# Patient Record
Sex: Female | Born: 1935 | Race: White | Hispanic: No | Smoking: Never smoker
Health system: Southern US, Community
[De-identification: ages and names within clinical notes are randomized; demographics above are authoritative.]

## PROBLEM LIST (undated history)

## (undated) DIAGNOSIS — I10 Essential (primary) hypertension: Secondary | ICD-10-CM

## (undated) DIAGNOSIS — M810 Age-related osteoporosis without current pathological fracture: Secondary | ICD-10-CM

## (undated) DIAGNOSIS — R251 Tremor, unspecified: Secondary | ICD-10-CM

## (undated) DIAGNOSIS — I4891 Unspecified atrial fibrillation: Secondary | ICD-10-CM

## (undated) DIAGNOSIS — I639 Cerebral infarction, unspecified: Secondary | ICD-10-CM

## (undated) HISTORY — DX: Unspecified atrial fibrillation: I48.91

## (undated) HISTORY — DX: Age-related osteoporosis without current pathological fracture: M81.0

## (undated) HISTORY — DX: Tremor, unspecified: R25.1

## (undated) HISTORY — DX: Essential (primary) hypertension: I10

## (undated) HISTORY — DX: Cerebral infarction, unspecified: I63.9

## (undated) HISTORY — PX: OTHER SURGICAL HISTORY: SHX169

## (undated) HISTORY — PX: CHOLECYSTECTOMY: SHX55

---

## 2004-05-16 DIAGNOSIS — C4492 Squamous cell carcinoma of skin, unspecified: Secondary | ICD-10-CM

## 2004-05-16 HISTORY — DX: Squamous cell carcinoma of skin, unspecified: C44.92

## 2004-11-14 DIAGNOSIS — C4491 Basal cell carcinoma of skin, unspecified: Secondary | ICD-10-CM

## 2004-11-14 DIAGNOSIS — D099 Carcinoma in situ, unspecified: Secondary | ICD-10-CM

## 2004-11-14 HISTORY — DX: Carcinoma in situ, unspecified: D09.9

## 2004-11-14 HISTORY — DX: Basal cell carcinoma of skin, unspecified: C44.91

## 2011-06-09 ENCOUNTER — Encounter (INDEPENDENT_AMBULATORY_CARE_PROVIDER_SITE_OTHER): Payer: Medicare Other | Admitting: Ophthalmology

## 2011-06-09 DIAGNOSIS — H26499 Other secondary cataract, unspecified eye: Secondary | ICD-10-CM

## 2011-06-09 DIAGNOSIS — H35039 Hypertensive retinopathy, unspecified eye: Secondary | ICD-10-CM

## 2011-06-09 DIAGNOSIS — H43819 Vitreous degeneration, unspecified eye: Secondary | ICD-10-CM

## 2011-06-09 DIAGNOSIS — H2510 Age-related nuclear cataract, unspecified eye: Secondary | ICD-10-CM

## 2011-06-09 DIAGNOSIS — I10 Essential (primary) hypertension: Secondary | ICD-10-CM

## 2011-06-10 ENCOUNTER — Encounter (INDEPENDENT_AMBULATORY_CARE_PROVIDER_SITE_OTHER): Payer: Self-pay | Admitting: Ophthalmology

## 2011-06-23 ENCOUNTER — Ambulatory Visit (INDEPENDENT_AMBULATORY_CARE_PROVIDER_SITE_OTHER): Payer: Medicare Other | Admitting: Ophthalmology

## 2011-06-23 DIAGNOSIS — H2700 Aphakia, unspecified eye: Secondary | ICD-10-CM

## 2017-11-11 ENCOUNTER — Ambulatory Visit (INDEPENDENT_AMBULATORY_CARE_PROVIDER_SITE_OTHER): Payer: Medicare Other | Admitting: Otolaryngology

## 2017-11-11 DIAGNOSIS — H903 Sensorineural hearing loss, bilateral: Secondary | ICD-10-CM

## 2017-11-11 DIAGNOSIS — H6123 Impacted cerumen, bilateral: Secondary | ICD-10-CM

## 2018-08-23 ENCOUNTER — Other Ambulatory Visit: Payer: Self-pay

## 2018-08-24 ENCOUNTER — Ambulatory Visit (INDEPENDENT_AMBULATORY_CARE_PROVIDER_SITE_OTHER): Payer: Medicare Other | Admitting: Physician Assistant

## 2018-08-24 ENCOUNTER — Encounter: Payer: Self-pay | Admitting: Physician Assistant

## 2018-08-24 VITALS — BP 139/76 | HR 68 | Temp 98.4°F | Ht 61.25 in | Wt 163.2 lb

## 2018-08-24 DIAGNOSIS — R251 Tremor, unspecified: Secondary | ICD-10-CM

## 2018-08-24 DIAGNOSIS — M810 Age-related osteoporosis without current pathological fracture: Secondary | ICD-10-CM | POA: Insufficient documentation

## 2018-08-24 DIAGNOSIS — I10 Essential (primary) hypertension: Secondary | ICD-10-CM | POA: Diagnosis not present

## 2018-08-28 NOTE — Progress Notes (Signed)
BP 139/76   Pulse 68   Temp 98.4 F (36.9 C) (Temporal)   Ht 5' 1.25" (1.556 m)   Wt 163 lb 3.2 oz (74 kg)   BMI 30.59 kg/m    Subjective:    Patient ID: Dominique Farley, female    DOB: 1926-11-25, 85 y.o.   MRN: [id number]  HPI: Dominique Farley is a 85 y.o. female presenting on 08/24/2018 for New Patient (Initial Visit) and Establish Care  This patient is accompanied by her daughter.  She has been a longtime patient of Dr. Melina Copa.  In November she will turn 89.  In reviewing her records it is noted that she does have mild hypertension, osteoporosis, and essential tremor.  She also reports that she has had several skin lesions and skin cancers.  She is seen by dermatology, Dr. Channing Mutters.  She has an upcoming appointment soon.  All of her medications are reviewed and we will update them at the pharmacy.  She reports that overall she does feel quite good and does not have any severe issues with pain, mobility, shortness of breath, chest pain.  Past Medical History:  Diagnosis Date  . Hypertension   . Osteoporosis   . Tremor    Relevant past medical, surgical, family and social history reviewed and updated as indicated. Interim medical history since our last visit reviewed. Allergies and medications reviewed and updated. DATA REVIEWED: CHART IN EPIC  Family History reviewed for pertinent findings.  Review of Systems  Constitutional: Negative.   HENT: Negative.   Eyes: Negative.   Respiratory: Negative.   Gastrointestinal: Negative.   Genitourinary: Negative.     Allergies as of 08/24/2018      Reactions   Influenza Vaccines    Sulfa Antibiotics Hives      Medication List       Accurate as of August 24, 2018 11:59 PM. If you have any questions, ask your nurse or doctor.        alendronate 70 MG tablet Commonly known as: FOSAMAX Take 70 mg by mouth once a week. Take with a full glass of water on an empty stomach.   hydrochlorothiazide 25 MG tablet Commonly known as:  HYDRODIURIL Take 25 mg by mouth 2 (two) times daily.   primidone 50 MG tablet Commonly known as: MYSOLINE TAKE 1 TABLET BY MOUTH THREE TIMES DAILY AS DIRECTED   propranolol 60 MG tablet Commonly known as: INDERAL Take 60 mg by mouth 2 (two) times daily.          Objective:    BP 139/76   Pulse 68   Temp 98.4 F (36.9 C) (Temporal)   Ht 5' 1.25" (1.556 m)   Wt 163 lb 3.2 oz (74 kg)   BMI 30.59 kg/m   Allergies  Allergen Reactions  . Influenza Vaccines   . Sulfa Antibiotics Hives    Wt Readings from Last 3 Encounters:  08/24/18 163 lb 3.2 oz (74 kg)    Physical Exam Constitutional:      General: She is not in acute distress.    Appearance: Normal appearance. She is well-developed.  HENT:     Head: Normocephalic and atraumatic.  Cardiovascular:     Rate and Rhythm: Normal rate.  Pulmonary:     Effort: Pulmonary effort is normal.  Skin:    General: Skin is warm and dry.     Findings: No rash.  Neurological:     Mental Status: She is alert and oriented to person, place,  and time.     Deep Tendon Reflexes: Reflexes are normal and symmetric.     No results found for this or any previous visit.    Assessment & Plan:   1. Tremor - primidone (MYSOLINE) 50 MG tablet; TAKE 1 TABLET BY MOUTH THREE TIMES DAILY AS DIRECTED - propranolol (INDERAL) 60 MG tablet; Take 60 mg by mouth 2 (two) times daily.  2. Essential hypertension - hydrochlorothiazide (HYDRODIURIL) 25 MG tablet; Take 25 mg by mouth 2 (two) times daily.  3. Age related osteoporosis, unspecified pathological fracture presence - alendronate (FOSAMAX) 70 MG tablet; Take 70 mg by mouth once a week. Take with a full glass of water on an empty stomach.   Continue all other maintenance medications as listed above.  Follow up plan: Return in about 3 months (around 11/24/2018).  Educational handout given for [place] PA-C [facility] [address]  [place],  [place] [address] [phone]   08/28/2018, 9:41 PM

## 2018-10-05 ENCOUNTER — Ambulatory Visit (INDEPENDENT_AMBULATORY_CARE_PROVIDER_SITE_OTHER): Payer: Medicare Other | Admitting: Orthopaedic Surgery

## 2018-10-05 ENCOUNTER — Ambulatory Visit: Payer: Self-pay

## 2018-10-05 ENCOUNTER — Other Ambulatory Visit: Payer: Self-pay

## 2018-10-05 ENCOUNTER — Encounter: Payer: Self-pay | Admitting: Orthopaedic Surgery

## 2018-10-05 VITALS — BP 200/100 | HR 68 | Ht 61.0 in | Wt 153.0 lb

## 2018-10-05 DIAGNOSIS — M25562 Pain in left knee: Secondary | ICD-10-CM

## 2018-10-05 DIAGNOSIS — M25561 Pain in right knee: Secondary | ICD-10-CM

## 2018-10-05 DIAGNOSIS — G8929 Other chronic pain: Secondary | ICD-10-CM | POA: Diagnosis not present

## 2018-10-05 DIAGNOSIS — M1711 Unilateral primary osteoarthritis, right knee: Secondary | ICD-10-CM | POA: Insufficient documentation

## 2018-10-05 DIAGNOSIS — M170 Bilateral primary osteoarthritis of knee: Secondary | ICD-10-CM

## 2018-10-05 MED ORDER — LIDOCAINE HCL 1 % IJ SOLN
2.0000 mL | INTRAMUSCULAR | Status: AC | PRN
  Administered 2018-10-05: 10:00:00 2 mL

## 2018-10-05 MED ORDER — METHYLPREDNISOLONE ACETATE 40 MG/ML IJ SUSP
80.0000 mg | INTRAMUSCULAR | Status: AC | PRN
  Administered 2018-10-05: 80 mg via INTRA_ARTICULAR

## 2018-10-05 MED ORDER — BUPIVACAINE HCL 0.5 % IJ SOLN
2.0000 mL | INTRAMUSCULAR | Status: AC | PRN
  Administered 2018-10-05: 10:00:00 2 mL via INTRA_ARTICULAR

## 2018-10-05 NOTE — Progress Notes (Signed)
Office Visit Note   Patient: Dominique Farley           Date of Birth: 03/01/1926           MRN: [id number] Visit Date: 10/05/2018              Requested by: Terald Sleeper, PA-C [address] [place],  [place] [address] PCP: Terald Sleeper, PA-C   Assessment & Plan: Visit Diagnoses:  1. Chronic pain of both knees   2. Bilateral primary osteoarthritis of knee     Plan: Advanced primary osteoarthritis both knees.  Right is more symptomatic than left.  Long discussion over 45 minutes regarding her diagnosis and treatment options.  She has had cortisone through her primary care physician in the past but now has retired.  Will inject the right knee with cortisone.  Return in 2 weeks to inject the left knee.  Pre-CERT Visco supplementation  Follow-Up Instructions: Return in about 2 weeks (around 10/19/2018).   Orders:  Orders Placed This Encounter  Procedures  . Large Joint Inj: R knee  . XR KNEE 3 VIEW LEFT  . XR KNEE 3 VIEW RIGHT   No orders of the defined types were placed in this encounter.     Procedures: Large Joint Inj: R knee on 10/05/2018 10:18 AM Indications: pain and diagnostic evaluation Details: 25 G 1.5 in needle, anteromedial approach  Arthrogram: No  Medications: 2 mL lidocaine 1 %; 2 mL bupivacaine 0.5 %; 80 mg methylPREDNISolone acetate 40 MG/ML Procedure, treatment alternatives, risks and benefits explained, specific risks discussed. Consent was given by the patient. Immediately prior to procedure a time out was called to verify the correct patient, procedure, equipment, support staff and site/side marked as required. Patient was prepped and draped in the usual sterile fashion.       Clinical Data: No additional findings.   Subjective: Chief Complaint  Patient presents with  . Left Knee - Pain  . Right Knee - Pain  Patient presents today for bilateral knee pain. X1 year. No known injury. Pain is worse anteriorly. She states that they feel like they will  give way with walking. Her pain is worse at night and while weightbearing. She takes Advil as needed.  Has had prior diagnosis of osteoarthritis through her primary care physician's office.  Has also had prior cortisone injections.  Her primary care physician, Dr. Melina Copa, has now retired  HPI  Review of Systems   Objective: Vital Signs: BP (!) 200/100   Pulse 68   Ht 5\' 1"  (1.549 m)   Wt 153 lb (69.4 kg)   BMI 28.91 kg/m   Physical Exam Constitutional:      Appearance: She is well-developed.  Eyes:     Pupils: Pupils are equal, round, and reactive to light.  Pulmonary:     Effort: Pulmonary effort is normal.  Skin:    General: Skin is warm and dry.  Neurological:     Mental Status: She is alert and oriented to person, place, and time.  Psychiatric:        Behavior: Behavior normal.     Ortho Exam awake alert and oriented x3.  Does have a little bit of an awkward gait related to the pain in her knees.  Full extension bilaterally and flex about 105degrees.  Small effusion right knee and none on the left.  Slight increased valgus on the right compared to the left.  No instability.  No popliteal pain or  mass.  Positive patella crepitation but no pain with compression.  Straight leg raise negative.  Painless range of motion both hips Specialty Comments:  No specialty comments available.  Imaging: Xr Knee 3 View Left  Result Date: 10/05/2018 Films of the left knee were obtained in several projections standing.  Similar to the right knee there are considerable degenerative changes.  Alignment appears to be neutral.  More degenerative change laterally than medially with peripheral osteophytes and narrowing of the joint space.  No ectopic calcification or acute change.  There is lateral patella tilt and considerable loss of the lateral patella joint space.  Films are consistent with advanced osteoarthritis  Xr Knee 3 View Right  Result Date: 10/05/2018 Films of the right knee were  obtained in 3 projections standing.  There are considerable degenerative changes in all 3 compartments but predominantly the patellofemoral joint and the lateral compartment.  There is narrowing of the joint space and large peripheral osteophytes laterally as well as some subchondral sclerosis.  There is lateral patella tilt with near obliteration of the lateral patella joint space.  No acute changes or ectopic calcification    PMFS History: Patient Active Problem List   Diagnosis Date Noted  . Bilateral primary osteoarthritis of knee 10/05/2018  . Tremor 08/24/2018  . Essential hypertension 08/24/2018  . Age related osteoporosis 08/24/2018   Past Medical History:  Diagnosis Date  . Hypertension   . Osteoporosis   . Tremor     Family History  Problem Relation Age of Onset  . Dementia Mother   . Stroke Father   . Heart disease Father   . Diabetes Sister     Past Surgical History:  Procedure Laterality Date  . CHOLECYSTECTOMY     Social History   Occupational History  . Not on file  Tobacco Use  . Smoking status: Never Smoker  . Smokeless tobacco: Never Used  Substance and Sexual Activity  . Alcohol use: Never    Frequency: Never  . Drug use: Never  . Sexual activity: Not on file

## 2018-10-10 ENCOUNTER — Other Ambulatory Visit: Payer: Self-pay | Admitting: Physician Assistant

## 2018-10-10 DIAGNOSIS — R251 Tremor, unspecified: Secondary | ICD-10-CM

## 2018-10-13 ENCOUNTER — Telehealth: Payer: Self-pay | Admitting: Orthopaedic Surgery

## 2018-10-13 NOTE — Telephone Encounter (Signed)
Please see below.

## 2018-10-13 NOTE — Telephone Encounter (Signed)
Patient left a voicemail stating she had appointment with Dr. Durward Fortes at the [place] office last week.  Patient states "he gave me a shot in my knee and I received a call that my insurance company won't pay for it."  Patient states "they have been paying for it when Dr. Melina Copa gave them to me."  Patient is requesting a return call.

## 2018-10-18 DIAGNOSIS — I63512 Cerebral infarction due to unspecified occlusion or stenosis of left middle cerebral artery: Secondary | ICD-10-CM

## 2018-10-18 HISTORY — DX: Cerebral infarction due to unspecified occlusion or stenosis of left middle cerebral artery: I63.512

## 2018-10-19 ENCOUNTER — Ambulatory Visit: Payer: Medicare Other | Admitting: Orthopaedic Surgery

## 2018-10-21 DIAGNOSIS — I161 Hypertensive emergency: Secondary | ICD-10-CM | POA: Insufficient documentation

## 2018-10-25 ENCOUNTER — Telehealth: Payer: Self-pay

## 2018-10-25 NOTE — Telephone Encounter (Signed)
Received information from [place] at the [place] office stating that patient was approved for gel injection, right knee. Patient's appointment was canceled due to being in the hospital.  Approved for Synvisc series, right knee. No PA required, per Sharee Pimple with [employer] Medicare Reference# [id number] on 10/12/2018

## 2018-10-31 ENCOUNTER — Telehealth: Payer: Self-pay | Admitting: Physician Assistant

## 2018-10-31 NOTE — Telephone Encounter (Signed)
Appointment scheduled 10/05/2018 at 9:25 with Particia Nearing.

## 2018-11-03 ENCOUNTER — Other Ambulatory Visit: Payer: Self-pay

## 2018-11-04 ENCOUNTER — Encounter: Payer: Self-pay | Admitting: Physician Assistant

## 2018-11-04 ENCOUNTER — Ambulatory Visit (INDEPENDENT_AMBULATORY_CARE_PROVIDER_SITE_OTHER): Payer: Medicare Other | Admitting: Physician Assistant

## 2018-11-04 VITALS — BP 138/74 | HR 81 | Temp 96.8°F | Ht 61.0 in | Wt 153.0 lb

## 2018-11-04 DIAGNOSIS — I10 Essential (primary) hypertension: Secondary | ICD-10-CM

## 2018-11-04 DIAGNOSIS — I4811 Longstanding persistent atrial fibrillation: Secondary | ICD-10-CM | POA: Diagnosis not present

## 2018-11-04 DIAGNOSIS — I161 Hypertensive emergency: Secondary | ICD-10-CM | POA: Diagnosis not present

## 2018-11-04 DIAGNOSIS — L03113 Cellulitis of right upper limb: Secondary | ICD-10-CM

## 2018-11-04 DIAGNOSIS — Z5181 Encounter for therapeutic drug level monitoring: Secondary | ICD-10-CM | POA: Insufficient documentation

## 2018-11-04 DIAGNOSIS — I63512 Cerebral infarction due to unspecified occlusion or stenosis of left middle cerebral artery: Secondary | ICD-10-CM

## 2018-11-04 DIAGNOSIS — I6930 Unspecified sequelae of cerebral infarction: Secondary | ICD-10-CM

## 2018-11-04 DIAGNOSIS — I4891 Unspecified atrial fibrillation: Secondary | ICD-10-CM | POA: Insufficient documentation

## 2018-11-04 DIAGNOSIS — Z7901 Long term (current) use of anticoagulants: Secondary | ICD-10-CM | POA: Insufficient documentation

## 2018-11-04 LAB — COAGUCHEK XS/INR WAIVED
INR: 3.2 — ABNORMAL HIGH (ref 0.9–1.1)
Prothrombin Time: 38.6 s

## 2018-11-04 MED ORDER — CEPHALEXIN 250 MG PO CAPS: 250.0000 mg | ORAL_CAPSULE | Freq: Three times a day (TID) | ORAL | 0 refills | Status: DC

## 2018-11-04 NOTE — Progress Notes (Addendum)
BP 138/74   Pulse 81   Temp (!) 96.8 F (36 C) (Temporal)   Ht 5\' 1"  (1.549 m)   Wt 153 lb (69.4 kg)   SpO2 97%   BMI 28.91 kg/m    Subjective:    Patient ID: Dominique Farley, female    DOB: Feb 24, 1926, 85 y.o.   MRN: [id number]  HPI: Dominique Farley is a 85 y.o. female presenting on 11/04/2018 for Hospitalization Follow-up    This patient is here for face-to-face follow-up following her CVA.  She was admitted to [facility] in [place].  She was discharged on 10/28/2018.  There was not the best of care when they were back on the floor.  While they were in the ICU and postsurgical everything went very well.  The patient went to the hospital here in [place].  Because of her hypertensive crisis and a blood clot that was seen on scan she was airlifted to the hospital and surgery performed.  She did have hypertensive crisis, atrial fibrillation and CVA.  Her charts are reviewed.  Her medications are updated.  She can stop the Lovenox as of today.  We will continue with the Coumadin.  She is taking 5 mg 1 daily.  We will plan for recheck in 1 week on the INR.  We are going to reach out to her home health agency and see if they can perform that while she is still somewhat limited in mobility and wheelchair-bound.  She already has physical therapy and Occupational Therapy coming to the home.  There is plan for speech therapy.  Her stroke left her with right-sided weakness in the arm and leg.  Also with speech been affected and loss of finding words.  She also has a very large hematoma where they had an IV.  Pictures included.  They have been trying to keep it warm and dressed and ice to the area.  I do think there is some infection there but also hematoma being aggravated because of her blood thinners.  For now she is to continue her aspirin every other day and Coumadin as discussed All of her other medications are updated.   Past Medical History:  Diagnosis Date  .  Hypertension   . Osteoporosis   . Tremor    Relevant past medical, surgical, family and social history reviewed and updated as indicated. Interim medical history since our last visit reviewed. Allergies and medications reviewed and updated. DATA REVIEWED: CHART IN EPIC  Family History reviewed for pertinent findings.  Review of Systems  Constitutional: Positive for fatigue. Negative for activity change and fever.  HENT: Negative.   Eyes: Negative.   Respiratory: Negative.  Negative for cough, shortness of breath and wheezing.   Cardiovascular: Negative.  Negative for chest pain, palpitations and leg swelling.  Gastrointestinal: Negative.  Negative for abdominal pain.  Endocrine: Negative.   Genitourinary: Negative.  Negative for dysuria.  Musculoskeletal: Negative.   Skin: Positive for color change and wound.  Neurological: Positive for speech difficulty and weakness. Negative for dizziness, light-headedness and numbness.    Allergies as of 11/04/2018      Reactions   Codeine Other (See Comments)   Influenza Vaccines    Sulfa Antibiotics Hives      Medication List       Accurate as of November 04, 2018 10:16 AM. If you have any questions, ask your nurse or doctor.        STOP taking these medications  alendronate 70 MG tablet Commonly known as: FOSAMAX Stopped by: Terald Sleeper, PA-C   hydrochlorothiazide 25 MG tablet Commonly known as: HYDRODIURIL Stopped by: Terald Sleeper, PA-C     TAKE these medications   amLODipine 5 MG tablet Commonly known as: NORVASC Take 5 mg by mouth daily.   aspirin EC 81 MG tablet Take 81 mg by mouth daily.   atorvastatin 40 MG tablet Commonly known as: LIPITOR Take 40 mg by mouth at bedtime.   cephALEXin 250 MG capsule Commonly known as: KEFLEX Take 1 capsule (250 mg total) by mouth 3 (three) times daily. Started by: Terald Sleeper, PA-C   enoxaparin 80 MG/0.8ML injection Commonly known as: LOVENOX Inject 70 mg into the  skin 2 (two) times daily.   ketoconazole 2 % cream Commonly known as: NIZORAL Apply 1 application topically daily.   lisinopril 40 MG tablet Commonly known as: ZESTRIL Take 40 mg by mouth daily.   primidone 50 MG tablet Commonly known as: MYSOLINE TAKE 1 TABLET BY MOUTH THREE TIMES DAILY AS DIRECTED   propranolol 60 MG tablet Commonly known as: INDERAL Take 30 mg by mouth 2 (two) times daily. What changed: Another medication with the same name was removed. Continue taking this medication, and follow the directions you see here. Changed by: Terald Sleeper, PA-C   sennosides-docusate sodium 8.6-50 MG tablet Commonly known as: SENOKOT-S Take 2 tablets by mouth daily.   triamcinolone cream 0.1 % Commonly known as: KENALOG Apply 1 application topically 2 (two) times daily.   warfarin 5 MG tablet Commonly known as: COUMADIN Take 5 mg by mouth daily.          Objective:    BP 138/74   Pulse 81   Temp (!) 96.8 F (36 C) (Temporal)   Ht 5\' 1"  (1.549 m)   Wt 153 lb (69.4 kg)   SpO2 97%   BMI 28.91 kg/m   Allergies  Allergen Reactions  . Codeine Other (See Comments)  . Influenza Vaccines   . Sulfa Antibiotics Hives    Wt Readings from Last 3 Encounters:  11/04/18 153 lb (69.4 kg)  10/05/18 153 lb (69.4 kg)  08/24/18 163 lb 3.2 oz (74 kg)    Physical Exam Constitutional:      General: She is not in acute distress.    Appearance: Normal appearance. She is well-developed.  HENT:     Head: Normocephalic and atraumatic.  Cardiovascular:     Rate and Rhythm: Normal rate.  Pulmonary:     Effort: Pulmonary effort is normal.  Skin:    General: Skin is warm and dry.     Findings: Bruising present. No rash.          Comments: 1 large hematoma/abscess on the right forearm just above the wrist.  Mildly tender to the touch, skin is dry and intact. Photo is included  Neurological:     Mental Status: She is alert and oriented to person, place, and time.     Cranial  Nerves: Cranial nerves are intact.     Motor: Weakness present.     Deep Tendon Reflexes: Reflexes are normal and symmetric.     Comments: Decreased strength in upper and lower extremities Patient is having some difficulty with her speech and and word finding     No results found for this or any previous visit.    Assessment & Plan:   1. Encounter for monitoring Coumadin therapy - CoaguChek XS/INR [employer] [facility]  2. Longstanding persistent atrial fibrillation (HCC) - warfarin (COUMADIN) 5 MG tablet; Take 5 mg by mouth daily.  3. Cerebrovascular accident (CVA) due to occlusion of left middle cerebral artery (HCC) - aspirin EC 81 MG tablet; Take 81 mg by mouth daily. - atorvastatin (LIPITOR) 40 MG tablet; Take 40 mg by mouth at bedtime. - enoxaparin (LOVENOX) 80 MG/0.8ML injection; Inject 70 mg into the skin 2 (two) times daily. - warfarin (COUMADIN) 5 MG tablet; Take 5 mg by mouth daily.  4. Hypertensive emergency  5. Cellulitis of right upper extremity - cephALEXin (KEFLEX) 250 MG capsule; Take 1 capsule (250 mg total) by mouth 3 (three) times daily.  Dispense: 30 capsule; Refill: 0  6. Essential hypertension - amLODipine (NORVASC) 5 MG tablet; Take 5 mg by mouth daily. - lisinopril (ZESTRIL) 40 MG tablet; Take 40 mg by mouth daily. - propranolol (INDERAL) 60 MG tablet; Take 30 mg by mouth 2 (two) times daily.    Continue all other maintenance medications as listed above.  Follow up plan: Return in about 4 weeks (around 12/02/2018).  Educational handout given for [place] PA-C [facility] [address]  [place], [place] [address] [phone]   11/04/2018, 10:16 AM

## 2018-11-07 ENCOUNTER — Telehealth: Payer: Self-pay | Admitting: Physician Assistant

## 2018-11-07 ENCOUNTER — Telehealth: Payer: Self-pay | Admitting: *Deleted

## 2018-11-07 ENCOUNTER — Other Ambulatory Visit: Payer: Self-pay | Admitting: Physician Assistant

## 2018-11-07 DIAGNOSIS — I4811 Longstanding persistent atrial fibrillation: Secondary | ICD-10-CM

## 2018-11-07 DIAGNOSIS — I63512 Cerebral infarction due to unspecified occlusion or stenosis of left middle cerebral artery: Secondary | ICD-10-CM

## 2018-11-07 MED ORDER — AMOXICILLIN 250 MG PO CAPS: 250.0000 mg | ORAL_CAPSULE | Freq: Three times a day (TID) | ORAL | 0 refills | Status: DC

## 2018-11-07 NOTE — Telephone Encounter (Signed)
VM from [place] w/ [facility] Today's INR 2.5 Pt takes 5 mg daily & ASA QOD

## 2018-11-07 NOTE — Telephone Encounter (Signed)
This patient is homebound, her home health agency performed at the bar conference today. 11/07/2018 Result 2.5 This is well controlled Goal 2.0-3.0 Current dose 5 mg daily Continue with this dose and plan to recheck in 1 week, patient is just moving over to Coumadin therapy.  Consider MD INR  Terald Sleeper PA-C [facility] [address]  [place], [place] [address] [phone]

## 2018-11-07 NOTE — Telephone Encounter (Signed)
Stop keflex, start amoxicillin, sent to pharm

## 2018-11-07 NOTE — Telephone Encounter (Signed)
Since beginning Keflex patient has been experiencing extreme weakness and fatigue.  Her daughters stopped the antibiotic yesterday and are wanting to you send in something else to [facility].  Please advise.

## 2018-11-07 NOTE — Telephone Encounter (Signed)
Please continue coumadin at 5 mg one tab daily, please have home health check in 1 week.  Can outreach to [place] be made about her qualifying for [place]? 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

## 2018-11-07 NOTE — Telephone Encounter (Signed)
Patient aware.

## 2018-11-07 NOTE — Telephone Encounter (Signed)
Left detailed message on [place] with specific coumadin instructions.

## 2018-11-15 ENCOUNTER — Other Ambulatory Visit: Payer: Self-pay | Admitting: Physician Assistant

## 2018-11-15 ENCOUNTER — Telehealth: Payer: Self-pay | Admitting: *Deleted

## 2018-11-15 DIAGNOSIS — I63512 Cerebral infarction due to unspecified occlusion or stenosis of left middle cerebral artery: Secondary | ICD-10-CM

## 2018-11-15 DIAGNOSIS — I4811 Longstanding persistent atrial fibrillation: Secondary | ICD-10-CM

## 2018-11-15 NOTE — Telephone Encounter (Signed)
VM from [place] w/ [facility] Pt is impacted, would like order for disimpaction Please advise

## 2018-11-15 NOTE — Telephone Encounter (Signed)
VM from [place] w/ [facility] INR 2.3

## 2018-11-15 NOTE — Telephone Encounter (Signed)
Pt's daughter was able to disimpact patient

## 2018-11-15 NOTE — Telephone Encounter (Signed)
This patient is homebound from CVA, her home health agency performed at the bar conference today.  11/15/2018 Result 2.3, well controlled Goal 2.0-3.0 Current dose 5 mg daily Continue with this dose and plan to recheck in 2 week, patient is just moving over to Coumadin therapy.  Consider MD INR  Terald Sleeper PA-C [facility] [address]  [place], [place] [address] [phone]

## 2018-11-15 NOTE — Telephone Encounter (Signed)
[facility] placed order for disimpaction

## 2018-11-15 NOTE — Telephone Encounter (Signed)
Left detailed message with [facility].

## 2018-11-21 ENCOUNTER — Ambulatory Visit (INDEPENDENT_AMBULATORY_CARE_PROVIDER_SITE_OTHER): Payer: Medicare Other | Admitting: Nurse Practitioner

## 2018-11-21 ENCOUNTER — Encounter: Payer: Self-pay | Admitting: Nurse Practitioner

## 2018-11-21 DIAGNOSIS — S60221A Contusion of right hand, initial encounter: Secondary | ICD-10-CM

## 2018-11-21 NOTE — Progress Notes (Signed)
   Virtual Visit via telephone Note Due to COVID-19 pandemic this visit was conducted virtually. This visit type was conducted due to national recommendations for restrictions regarding the COVID-19 Pandemic (e.g. social distancing, sheltering in place) in an effort to limit this patient's exposure and mitigate transmission in our community. All issues noted in this document were discussed and addressed.  A physical exam was not performed with this format.  I connected with Dominique Farley on 11/21/18 at 10:20 by telephone and verified that I am speaking with the correct person using two identifiers. Dominique Farley is currently located at home and her daughter is currently with her during visit. The provider, Mary-Margaret Hassell Done, FNP is located in their office at time of visit.  I discussed the limitations, risks, security and privacy concerns of performing an evaluation and management service by telephone and the availability of in person appointments. I also discussed with the patient that there may be a patient responsible charge related to this service. The patient expressed understanding and agreed to proceed.   History and Present Illness: * spoke with daughter because she cannot hear on the phone.   Chief Complaint: Hand Pain   HPI Daughter says that mom woke up Saturday with c/o pain right hand. They could not see anything on it. yesterday she was still c/o soe pain but they noticed that her hand was brusied and swollen between her right index and middle finger. She denies any pain today. No erythema and not hot to touch. Patient is on blood thinner.  Review of Systems  Constitutional: Negative.   HENT: Negative.   Respiratory: Negative.   Cardiovascular: Negative.   Genitourinary: Negative.   Musculoskeletal: Negative.   Skin: Negative.   Neurological: Negative.   Psychiatric/Behavioral: Negative.   All other systems reviewed and are negative.    Observations/Objective: Alert and  oriented- answers all questions appropriately No distress    Assessment and Plan: Dominique Farley in today with chief complaint of Hand Pain   1. Contusion of right hand, initial encounter Ice Elevate when sitting   Follow Up Instructions: prn    I discussed the assessment and treatment plan with the patient. The patient was provided an opportunity to ask questions and all were answered. The patient agreed with the plan and demonstrated an understanding of the instructions.   The patient was advised to call back or seek an in-person evaluation if the symptoms worsen or if the condition fails to improve as anticipated.  The above assessment and management plan was discussed with the patient. The patient verbalized understanding of and has agreed to the management plan. Patient is aware to call the clinic if symptoms persist or worsen. Patient is aware when to return to the clinic for a follow-up visit. Patient educated on when it is appropriate to go to the emergency department.   Time call ended:  10:33 I provided 13 minutes of non-face-to-face time during this encounter.    Mary-Margaret Hassell Done, FNP

## 2018-11-24 ENCOUNTER — Other Ambulatory Visit: Payer: Self-pay

## 2018-11-25 ENCOUNTER — Encounter: Payer: Self-pay | Admitting: Physician Assistant

## 2018-11-25 ENCOUNTER — Ambulatory Visit (INDEPENDENT_AMBULATORY_CARE_PROVIDER_SITE_OTHER): Payer: Medicare Other | Admitting: Physician Assistant

## 2018-11-25 VITALS — BP 133/67 | HR 73 | Temp 96.4°F | Ht 61.0 in

## 2018-11-25 DIAGNOSIS — I6930 Unspecified sequelae of cerebral infarction: Secondary | ICD-10-CM

## 2018-11-25 DIAGNOSIS — I4811 Longstanding persistent atrial fibrillation: Secondary | ICD-10-CM | POA: Diagnosis not present

## 2018-11-25 DIAGNOSIS — I10 Essential (primary) hypertension: Secondary | ICD-10-CM

## 2018-11-25 DIAGNOSIS — I63512 Cerebral infarction due to unspecified occlusion or stenosis of left middle cerebral artery: Secondary | ICD-10-CM

## 2018-11-25 LAB — COAGUCHEK XS/INR WAIVED
INR: 2.4 — ABNORMAL HIGH (ref 0.9–1.1)
Prothrombin Time: 29 s

## 2018-11-25 MED ORDER — ATORVASTATIN CALCIUM 40 MG PO TABS: 40.0000 mg | ORAL_TABLET | Freq: Every day | ORAL | 1 refills | Status: DC

## 2018-11-25 MED ORDER — LISINOPRIL 20 MG PO TABS: 20.0000 mg | ORAL_TABLET | Freq: Every day | ORAL | 1 refills | Status: DC

## 2018-11-25 MED ORDER — AMLODIPINE BESYLATE 5 MG PO TABS: 5.0000 mg | ORAL_TABLET | Freq: Every day | ORAL | 1 refills | Status: DC

## 2018-11-25 MED ORDER — WARFARIN SODIUM 5 MG PO TABS: 5.0000 mg | ORAL_TABLET | Freq: Every day | ORAL | 1 refills | Status: DC

## 2018-12-02 NOTE — Progress Notes (Signed)
BP 133/67   Pulse 73   Temp (!) 96.4 F (35.8 C) (Temporal)   Ht 5\' 1"  (1.549 m)   SpO2 98%   BMI 28.91 kg/m    Subjective:    Patient ID: Dominique Farley, female    DOB: [birth year], 85 y.o.   MRN: [id number]  HPI: Dominique Farley is a 85 y.o. female presenting on 11/25/2018 for Hypertension, Anticoagulation, Medical Management of Chronic Issues (3 month ), and Hand Pain (swelling )  This patient comes in for recheck from her recent stroke.  She is accompanied by her daughter.  She states that they had gotten her up and walking very well with a walker.  She is able to transverse through her house very well.  She is able to get up out of the bed now and get to the bathroom using the walker.  She still does need follow-ups with cardiology and neurology.  She denies any other new symptoms, no other neurologic changes.  No chest pain no shortness of breath.  Home health is still in the home.  Home health is also drawing the PT/INR for her Coumadin therapy.  This patient comes in for an anticoagulation visit.     Indication: CVA Bleeding signs/symptoms: None Thromboembolic signs/symptoms: None  Missed Coumadin doses: None Medication changes: no Dietary changes: no Bacterial/viral infection: no Other concerns: no   11/04/2018: This patient is here for face-to-face follow-up following her CVA.  She was admitted to [facility] in [place].  She was discharged on 10/28/2018.  There was not the best of care when they were back on the floor.  While they were in the ICU and postsurgical everything went very well.  The patient went to the hospital here in [facility].  Because of her hypertensive crisis and a blood clot that was seen on scan she was airlifted to the hospital and surgery performed.  She did have hypertensive crisis, atrial fibrillation and CVA.  Her charts are reviewed.  Her medications are updated.  She can stop the Lovenox as of today.  We will continue with  the Coumadin.  She is taking 5 mg 1 daily.  We will plan for recheck in 1 week on the INR.  We are going to reach out to her home health agency and see if they can perform that while she is still somewhat limited in mobility and wheelchair-bound.  She already has physical therapy and Occupational Therapy coming to the home.  There is plan for speech therapy.  Her stroke left her with right-sided weakness in the arm and leg.  Also with speech been affected and loss of finding words.  Past Medical History:  Diagnosis Date  . Hypertension   . Osteoporosis   . Tremor    Relevant past medical, surgical, family and social history reviewed and updated as indicated. Interim medical history since our last visit reviewed. Allergies and medications reviewed and updated. DATA REVIEWED: CHART IN EPIC  Family History reviewed for pertinent findings.  Review of Systems  Constitutional: Negative.  Negative for activity change, fatigue and fever.  HENT: Negative.   Eyes: Negative.   Respiratory: Negative.  Negative for cough.   Cardiovascular: Negative.  Negative for chest pain.  Gastrointestinal: Negative.  Negative for abdominal pain.  Endocrine: Negative.   Genitourinary: Negative.  Negative for dysuria.  Musculoskeletal: Positive for arthralgias and joint swelling.  Skin: Negative.   Neurological: Positive for weakness. Negative for dizziness, facial asymmetry and  numbness.    Allergies as of 11/25/2018      Reactions   Codeine Other (See Comments)   Influenza Vaccines    Sulfa Antibiotics Hives      Medication List       Accurate as of November 25, 2018 11:59 PM. If you have any questions, ask your nurse or doctor.        STOP taking these medications   amoxicillin 250 MG capsule Commonly known as: AMOXIL Stopped by: Terald Sleeper, PA-C   enoxaparin 80 MG/0.8ML injection Commonly known as: LOVENOX Stopped by: Terald Sleeper, PA-C     TAKE these medications   amLODipine 5 MG  tablet Commonly known as: NORVASC Take 1 tablet (5 mg total) by mouth daily.   aspirin EC 81 MG tablet Take 81 mg by mouth daily.   atorvastatin 40 MG tablet Commonly known as: LIPITOR Take 1 tablet (40 mg total) by mouth at bedtime.   ketoconazole 2 % cream Commonly known as: NIZORAL Apply 1 application topically daily.   lisinopril 20 MG tablet Commonly known as: ZESTRIL Take 1 tablet (20 mg total) by mouth daily. What changed: medication strength Changed by: Terald Sleeper, PA-C   primidone 50 MG tablet Commonly known as: MYSOLINE TAKE 1 TABLET BY MOUTH THREE TIMES DAILY AS DIRECTED   propranolol 60 MG tablet Commonly known as: INDERAL Take 30 mg by mouth 2 (two) times daily.   sennosides-docusate sodium 8.6-50 MG tablet Commonly known as: SENOKOT-S Take 2 tablets by mouth daily.   triamcinolone cream 0.1 % Commonly known as: KENALOG Apply 1 application topically 2 (two) times daily.   warfarin 5 MG tablet Commonly known as: COUMADIN Take as directed by the anticoagulation clinic. If you are unsure how to take this medication, talk to your nurse or doctor. Original instructions: Take 1 tablet (5 mg total) by mouth daily.          Objective:    BP 133/67   Pulse 73   Temp (!) 96.4 F (35.8 C) (Temporal)   Ht 5\' 1"  (1.549 m)   SpO2 98%   BMI 28.91 kg/m   Allergies  Allergen Reactions  . Codeine Other (See Comments)  . Influenza Vaccines   . Sulfa Antibiotics Hives    Wt Readings from Last 3 Encounters:  11/04/18 153 lb (69.4 kg)  10/05/18 153 lb (69.4 kg)  08/24/18 163 lb 3.2 oz (74 kg)    Physical Exam Constitutional:      General: She is not in acute distress.    Appearance: Normal appearance. She is well-developed.  HENT:     Head: Normocephalic and atraumatic.  Cardiovascular:     Rate and Rhythm: Normal rate. Rhythm irregular.  Pulmonary:     Effort: Pulmonary effort is normal.  Skin:    General: Skin is warm and dry.     Findings:  No rash.  Neurological:     Mental Status: She is alert and oriented to person, place, and time.     Deep Tendon Reflexes: Reflexes are normal and symmetric.     Results for orders placed or performed in visit on 11/25/18  inr FINGERSTICK  Result Value Ref Range   INR 2.4 (H) 0.9 - 1.1   Prothrombin Time 29.0 sec      Assessment & Plan:   1. Longstanding persistent atrial fibrillation (HCC) - inr FINGERSTICK; Standing - inr FINGERSTICK - warfarin (COUMADIN) 5 MG tablet; Take 1 tablet (5 mg  total) by mouth daily.  Dispense: 90 tablet; Refill: 1 - Ambulatory referral to Cardiology - Ambulatory referral to Neurology  2. Cerebrovascular accident (CVA) due to occlusion of left middle cerebral artery (HCC) - atorvastatin (LIPITOR) 40 MG tablet; Take 1 tablet (40 mg total) by mouth at bedtime.  Dispense: 90 tablet; Refill: 1 - warfarin (COUMADIN) 5 MG tablet; Take 1 tablet (5 mg total) by mouth daily.  Dispense: 90 tablet; Refill: 1 - Ambulatory referral to Cardiology - Ambulatory referral to Neurology  3. Essential hypertension - amLODipine (NORVASC) 5 MG tablet; Take 1 tablet (5 mg total) by mouth daily.  Dispense: 90 tablet; Refill: 1 - lisinopril (ZESTRIL) 20 MG tablet; Take 1 tablet (20 mg total) by mouth daily.  Dispense: 90 tablet; Refill: 1 - Ambulatory referral to Cardiology   Continue all other maintenance medications as listed above.  Follow up plan: Follow up 3 weeks  Educational handout given for coumadin instruction  Terald Sleeper PA-C [place] [address]  [place], [place] [address] [phone]   12/02/2018, 5:28 PM

## 2018-12-05 ENCOUNTER — Encounter: Payer: Self-pay | Admitting: *Deleted

## 2018-12-06 ENCOUNTER — Other Ambulatory Visit: Payer: Self-pay

## 2018-12-06 ENCOUNTER — Encounter: Payer: Self-pay | Admitting: Cardiovascular Disease

## 2018-12-06 ENCOUNTER — Ambulatory Visit: Payer: Medicare Other | Admitting: Cardiovascular Disease

## 2018-12-06 VITALS — BP 160/80 | HR 70 | Ht 61.0 in | Wt 158.0 lb

## 2018-12-06 DIAGNOSIS — I4891 Unspecified atrial fibrillation: Secondary | ICD-10-CM | POA: Diagnosis not present

## 2018-12-06 DIAGNOSIS — I63512 Cerebral infarction due to unspecified occlusion or stenosis of left middle cerebral artery: Secondary | ICD-10-CM | POA: Diagnosis not present

## 2018-12-06 DIAGNOSIS — I10 Essential (primary) hypertension: Secondary | ICD-10-CM | POA: Diagnosis not present

## 2018-12-06 NOTE — Patient Instructions (Addendum)

## 2018-12-06 NOTE — Progress Notes (Signed)
CARDIOLOGY CONSULT NOTE  Patient ID: Dominique Farley MRN: [id number] DOB/AGE: 83/01/1926 83 y.o.  Admit date: (Not on file) Primary Physician: Terald Sleeper, PA-C  Reason for Consultation: Atrial fibrillation  HPI: Dominique Farley is a 85 y.o. female who is being seen today for the evaluation of atrial fibrillation at the request of Theodoro Clock.   PCP records indicate the patient has a history of longstanding persistent atrial fibrillation.  Currently on warfarin for anticoagulation.  She also has a history of CVA due to occlusion of the left middle cerebral artery.  Past medical history also includes hypertension.  She is here with her daughter.  The patient used to see Dr. Octavio Graves.  She was never told she had atrial fibrillation prior to hospitalization in October.  It appears she was diagnosed with atrial fibrillation at that time.  She underwent an echocardiogram on 10/18/2018 which demonstrated normal LV systolic function EF [id number].  She underwent removal of left MCA embolus while hospitalized.  She currently denies chest pain, palpitations, leg swelling and shortness of breath.  I reviewed her blood pressure log which shows blood pressures in the 120/60-80 range.  BP is elevated today because she is nervous.  She takes aspirin every other day and warfarin every evening.  Anticoagulation is managed by her PCP.   Allergies  Allergen Reactions  . Codeine Other (See Comments)  . Influenza Vaccines   . Sulfa Antibiotics Hives    Current Outpatient Medications  Medication Sig Dispense Refill  . amLODipine (NORVASC) 5 MG tablet Take 1 tablet (5 mg total) by mouth daily. 90 tablet 1  . aspirin EC 81 MG tablet Take 81 mg by mouth daily.    Marland Kitchen atorvastatin (LIPITOR) 40 MG tablet Take 1 tablet (40 mg total) by mouth at bedtime. 90 tablet 1  . lisinopril (ZESTRIL) 20 MG tablet Take 1 tablet (20 mg total) by mouth daily. 90 tablet 1  . primidone (MYSOLINE) 50 MG tablet  TAKE 1 TABLET BY MOUTH THREE TIMES DAILY AS DIRECTED    . propranolol (INDERAL) 60 MG tablet Take 30 mg by mouth 2 (two) times daily.    . sennosides-docusate sodium (SENOKOT-S) 8.6-50 MG tablet Take 2 tablets by mouth daily.    Marland Kitchen warfarin (COUMADIN) 5 MG tablet Take 1 tablet (5 mg total) by mouth daily. 90 tablet 1   No current facility-administered medications for this visit.     Past Medical History:  Diagnosis Date  . Hypertension   . Osteoporosis   . Tremor     Past Surgical History:  Procedure Laterality Date  . CHOLECYSTECTOMY      Social History   Socioeconomic History  . Marital status: Married    Spouse name: Not on file  . Number of children: Not on file  . Years of education: Not on file  . Highest education level: Not on file  Occupational History  . Not on file  Social Needs  . Financial resource strain: Not on file  . Food insecurity    Worry: Not on file    Inability: Not on file  . Transportation needs    Medical: Not on file    Non-medical: Not on file  Tobacco Use  . Smoking status: Never Smoker  . Smokeless tobacco: Never Used  Substance and Sexual Activity  . Alcohol use: Never    Frequency: Never  . Drug use: Never  . Sexual activity: Not on file  Lifestyle  . Physical activity    Days per week: Not on file    Minutes per session: Not on file  . Stress: Not on file  Relationships  . Social Herbalist on phone: Not on file    Gets together: Not on file    Attends religious service: Not on file    Active member of club or organization: Not on file    Attends meetings of clubs or organizations: Not on file    Relationship status: Not on file  . Intimate partner violence    Fear of current or ex partner: Not on file    Emotionally abused: Not on file    Physically abused: Not on file    Forced sexual activity: Not on file  Other Topics Concern  . Not on file  Social History Narrative  . Not on file     No family  history of premature CAD in 1st degree relatives.  Current Meds  Medication Sig  . amLODipine (NORVASC) 5 MG tablet Take 1 tablet (5 mg total) by mouth daily.  Marland Kitchen aspirin EC 81 MG tablet Take 81 mg by mouth daily.  Marland Kitchen atorvastatin (LIPITOR) 40 MG tablet Take 1 tablet (40 mg total) by mouth at bedtime.  Marland Kitchen lisinopril (ZESTRIL) 20 MG tablet Take 1 tablet (20 mg total) by mouth daily.  . primidone (MYSOLINE) 50 MG tablet TAKE 1 TABLET BY MOUTH THREE TIMES DAILY AS DIRECTED  . propranolol (INDERAL) 60 MG tablet Take 30 mg by mouth 2 (two) times daily.  . sennosides-docusate sodium (SENOKOT-S) 8.6-50 MG tablet Take 2 tablets by mouth daily.  Marland Kitchen warfarin (COUMADIN) 5 MG tablet Take 1 tablet (5 mg total) by mouth daily.  . [DISCONTINUED] ketoconazole (NIZORAL) 2 % cream Apply 1 application topically daily.  . [DISCONTINUED] triamcinolone cream (KENALOG) 0.1 % Apply 1 application topically 2 (two) times daily.      Review of systems complete and found to be negative unless listed above in HPI    Physical exam Blood pressure (!) 160/80, pulse 70, height 5\' 1"  (1.549 m), weight 158 lb (71.7 kg), SpO2 92 %. General: NAD Neck: No JVD, no thyromegaly or thyroid nodule.  Lungs: Clear to auscultation bilaterally with normal respiratory effort. CV: Nondisplaced PMI. Regular rate and irregular rhythm, normal S1/S2, no S3, no murmur.  No peripheral edema.   Abdomen: Soft, nontender, no distention.  Skin: Intact without lesions or rashes.  Neurologic: Alert and oriented x 3.  Psych: Normal affect. Extremities: No clubbing or cyanosis.  HEENT: Normal.   ECG: Most recent ECG reviewed.   Labs: No results found for: K, BUN, CREATININE, ALT, TSH, HGB   Lipids: No results found for: LDLCALC, LDLDIRECT, CHOL, TRIG, HDL      ASSESSMENT AND PLAN:  1.  Atrial fibrillation: This appears to have been diagnosed in October 2020 at the time of CVA.  Anticoagulated with warfarin.  Heart rate controlled with  propranolol.  She is asymptomatic with respect to this.  Anticoagulation managed by PCP.  2.  History of CVA: Currently on aspirin every other day and atorvastatin.  3. Hypertension: I reviewed her blood pressure log which shows blood pressures in the 120/60-80 range.  BP is elevated today because she is nervous.     Disposition: Follow up in 6 months  Signed: Kate Sable, M.D., F.A.C.C.  12/06/2018, 2:08 PM

## 2018-12-07 ENCOUNTER — Ambulatory Visit (INDEPENDENT_AMBULATORY_CARE_PROVIDER_SITE_OTHER): Payer: Medicare Other

## 2018-12-07 DIAGNOSIS — Z7982 Long term (current) use of aspirin: Secondary | ICD-10-CM

## 2018-12-07 DIAGNOSIS — I69351 Hemiplegia and hemiparesis following cerebral infarction affecting right dominant side: Secondary | ICD-10-CM

## 2018-12-07 DIAGNOSIS — I69320 Aphasia following cerebral infarction: Secondary | ICD-10-CM

## 2018-12-07 DIAGNOSIS — Z87440 Personal history of urinary (tract) infections: Secondary | ICD-10-CM

## 2018-12-07 DIAGNOSIS — I4891 Unspecified atrial fibrillation: Secondary | ICD-10-CM

## 2018-12-07 DIAGNOSIS — Z9181 History of falling: Secondary | ICD-10-CM

## 2018-12-07 DIAGNOSIS — I10 Essential (primary) hypertension: Secondary | ICD-10-CM | POA: Diagnosis not present

## 2018-12-07 DIAGNOSIS — Z7901 Long term (current) use of anticoagulants: Secondary | ICD-10-CM

## 2018-12-07 DIAGNOSIS — G250 Essential tremor: Secondary | ICD-10-CM

## 2018-12-07 DIAGNOSIS — R001 Bradycardia, unspecified: Secondary | ICD-10-CM

## 2018-12-16 ENCOUNTER — Other Ambulatory Visit: Payer: Self-pay

## 2018-12-19 ENCOUNTER — Ambulatory Visit (INDEPENDENT_AMBULATORY_CARE_PROVIDER_SITE_OTHER): Payer: Medicare Other | Admitting: Physician Assistant

## 2018-12-19 ENCOUNTER — Encounter: Payer: Self-pay | Admitting: Physician Assistant

## 2018-12-19 VITALS — BP 138/65 | HR 73 | Temp 96.8°F | Ht 61.0 in | Wt 159.8 lb

## 2018-12-19 DIAGNOSIS — I4811 Longstanding persistent atrial fibrillation: Secondary | ICD-10-CM

## 2018-12-19 DIAGNOSIS — I6930 Unspecified sequelae of cerebral infarction: Secondary | ICD-10-CM

## 2018-12-19 DIAGNOSIS — I63512 Cerebral infarction due to unspecified occlusion or stenosis of left middle cerebral artery: Secondary | ICD-10-CM

## 2018-12-19 LAB — COAGUCHEK XS/INR WAIVED
INR: 2.2 — ABNORMAL HIGH (ref 0.9–1.1)
Prothrombin Time: 26.6 s

## 2018-12-19 NOTE — Patient Instructions (Addendum)
12/19/2018 Result 2.2 This is well controlled Goal 2.0-3.0 Current dose 5 mg daily Continue with this dose and plan to recheck in 2 week, patient is just moving over to Coumadin therapy.   Recheck 5 weeks  Low-Sodium Eating Plan Sodium, which is an element that makes up salt, helps you maintain a healthy balance of fluids in your body. Too much sodium can increase your blood pressure and cause fluid and waste to be held in your body. Your health care provider or dietitian may recommend following this plan if you have high blood pressure (hypertension), kidney disease, liver disease, or heart failure. Eating less sodium can help lower your blood pressure, reduce swelling, and protect your heart, liver, and kidneys. What are tips for following this plan? General guidelines  Most people on this plan should limit their sodium intake to 1,500-2,000 mg (milligrams) of sodium each day. Reading food labels   The Nutrition Facts label lists the amount of sodium in one serving of the food. If you eat more than one serving, you must multiply the listed amount of sodium by the number of servings.  Choose foods with less than 140 mg of sodium per serving.  Avoid foods with 300 mg of sodium or more per serving. Shopping  Look for lower-sodium products, often labeled as "low-sodium" or "no salt added."  Always check the sodium content even if foods are labeled as "unsalted" or "no salt added".  Buy fresh foods. ? Avoid canned foods and premade or frozen meals. ? Avoid canned, cured, or processed meats  Buy breads that have less than 80 mg of sodium per slice. Cooking  Eat more home-cooked food and less restaurant, buffet, and fast food.  Avoid adding salt when cooking. Use salt-free seasonings or herbs instead of table salt or sea salt. Check with your health care provider or pharmacist before using salt substitutes.  Cook with plant-based oils, such as canola, sunflower, or olive oil. Meal  planning  When eating at a restaurant, ask that your food be prepared with less salt or no salt, if possible.  Avoid foods that contain MSG (monosodium glutamate). MSG is sometimes added to [place] food, bouillon, and some canned foods. What foods are recommended? The items listed may not be a complete list. Talk with your dietitian about what dietary choices are best for you. Grains Low-sodium cereals, including oats, puffed wheat and rice, and shredded wheat. Low-sodium crackers. Unsalted rice. Unsalted pasta. Low-sodium bread. Whole-grain breads and whole-grain pasta. Vegetables Fresh or frozen vegetables. "No salt added" canned vegetables. "No salt added" tomato sauce and paste. Low-sodium or reduced-sodium tomato and vegetable juice. Fruits Fresh, frozen, or canned fruit. Fruit juice. Meats and other protein foods Fresh or frozen (no salt added) meat, poultry, seafood, and fish. Low-sodium canned tuna and salmon. Unsalted nuts. Dried peas, beans, and lentils without added salt. Unsalted canned beans. Eggs. Unsalted nut butters. Dairy Milk. Soy milk. Cheese that is naturally low in sodium, such as ricotta cheese, fresh mozzarella, or Swiss cheese Low-sodium or reduced-sodium cheese. Cream cheese. Yogurt. Fats and oils Unsalted butter. Unsalted margarine with no trans fat. Vegetable oils such as canola or olive oils. Seasonings and other foods Fresh and dried herbs and spices. Salt-free seasonings. Low-sodium mustard and ketchup. Sodium-free salad dressing. Sodium-free light mayonnaise. Fresh or refrigerated horseradish. Lemon juice. Vinegar. Homemade, reduced-sodium, or low-sodium soups. Unsalted popcorn and pretzels. Low-salt or salt-free chips. What foods are not recommended? The items listed may not be a complete list.  Talk with your dietitian about what dietary choices are best for you. Grains Instant hot cereals. Bread stuffing, pancake, and biscuit mixes. Croutons. Seasoned rice or  pasta mixes. Noodle soup cups. Boxed or frozen macaroni and cheese. Regular salted crackers. Self-rising flour. Vegetables Sauerkraut, pickled vegetables, and relishes. Olives. [place] fries. Onion rings. Regular canned vegetables (not low-sodium or reduced-sodium). Regular canned tomato sauce and paste (not low-sodium or reduced-sodium). Regular tomato and vegetable juice (not low-sodium or reduced-sodium). Frozen vegetables in sauces. Meats and other protein foods Meat or fish that is salted, canned, smoked, spiced, or pickled. Bacon, ham, sausage, hotdogs, corned beef, chipped beef, packaged lunch meats, salt pork, jerky, pickled herring, anchovies, regular canned tuna, sardines, salted nuts. Dairy Processed cheese and cheese spreads. Cheese curds. Blue cheese. Feta cheese. String cheese. Regular cottage cheese. Buttermilk. Canned milk. Fats and oils Salted butter. Regular margarine. Ghee. Bacon fat. Seasonings and other foods Onion salt, garlic salt, seasoned salt, table salt, and sea salt. Canned and packaged gravies. Worcestershire sauce. Tartar sauce. Barbecue sauce. Teriyaki sauce. Soy sauce, including reduced-sodium. Steak sauce. Fish sauce. Oyster sauce. Cocktail sauce. Horseradish that you find on the shelf. Regular ketchup and mustard. Meat flavorings and tenderizers. Bouillon cubes. Hot sauce and Tabasco sauce. Premade or packaged marinades. Premade or packaged taco seasonings. Relishes. Regular salad dressings. Salsa. Potato and tortilla chips. Corn chips and puffs. Salted popcorn and pretzels. Canned or dried soups. Pizza. Frozen entrees and pot pies. Summary  Eating less sodium can help lower your blood pressure, reduce swelling, and protect your heart, liver, and kidneys.  Most people on this plan should limit their sodium intake to 1,500-2,000 mg (milligrams) of sodium each day.  Canned, boxed, and frozen foods are high in sodium. Restaurant foods, fast foods, and pizza are also  very high in sodium. You also get sodium by adding salt to food.  Try to cook at home, eat more fresh fruits and vegetables, and eat less fast food, canned, processed, or prepared foods. This information is not intended to replace advice given to you by your health care provider. Make sure you discuss any questions you have with your health care provider. Document Released: 06/20/2001 Document Revised: 12/11/2016 Document Reviewed: 12/23/2015 Elsevier Patient Education  2020 [employer].

## 2018-12-19 NOTE — Progress Notes (Deleted)
 Acute Office Visit  Subjective:    Patient ID: Dominique Farley, female    DOB: 08/28/1926, 85 y.o.   MRN: [id number]   HPI  This patient comes in for an anticoagulation visit.     Indication: atrial fibrillation Bleeding signs/symptoms: None Thromboembolic signs/symptoms: None  Missed Coumadin doses: None Medication changes: no Dietary changes: no Bacterial/viral infection: no Other concerns: no  12/19/2018  Result 2.2  This is well controlled  Goal 2.0-3.0  Current dose 5 mg daily  Continue with this dose and plan to recheck in 2 week, patient is just moving over to Coumadin therapy.   Recheck 5 weeks   Past Medical History:  Diagnosis Date  . Hypertension   . Osteoporosis   . Tremor     Past Surgical History:  Procedure Laterality Date  . CHOLECYSTECTOMY      Family History  Problem Relation Age of Onset  . Dementia Mother   . Stroke Father   . Heart disease Father   . Diabetes Sister     Social History   Socioeconomic History  . Marital status: Married    Spouse name: Not on file  . Number of children: Not on file  . Years of education: Not on file  . Highest education level: Not on file  Occupational History  . Not on file  Social Needs  . Financial resource strain: Not on file  . Food insecurity    Worry: Not on file    Inability: Not on file  . Transportation needs    Medical: Not on file    Non-medical: Not on file  Tobacco Use  . Smoking status: Never Smoker  . Smokeless tobacco: Never Used  Substance and Sexual Activity  . Alcohol use: Never    Frequency: Never  . Drug use: Never  . Sexual activity: Not on file  Lifestyle  . Physical activity    Days per week: Not on file    Minutes per session: Not on file  . Stress: Not on file  Relationships  . Social connections    Talks on phone: Not on file    Gets together: Not on file    Attends religious service: Not on file    Active member of club or organization: Not on file   Attends meetings of clubs or organizations: Not on file    Relationship status: Not on file  . Intimate partner violence    Fear of current or ex partner: Not on file    Emotionally abused: Not on file    Physically abused: Not on file    Forced sexual activity: Not on file  Other Topics Concern  . Not on file  Social History Narrative  . Not on file    Outpatient Medications Prior to Visit  Medication Sig Dispense Refill  . amLODipine (NORVASC) 5 MG tablet Take 1 tablet (5 mg total) by mouth daily. 90 tablet 1  . aspirin EC 81 MG tablet Take 81 mg by mouth daily.    . atorvastatin (LIPITOR) 40 MG tablet Take 1 tablet (40 mg total) by mouth at bedtime. 90 tablet 1  . lisinopril (ZESTRIL) 20 MG tablet Take 1 tablet (20 mg total) by mouth daily. 90 tablet 1  . primidone (MYSOLINE) 50 MG tablet TAKE 1 TABLET BY MOUTH THREE TIMES DAILY AS DIRECTED    . propranolol (INDERAL) 60 MG tablet Take 30 mg by mouth 2 (two) times daily.    . sennosides-docusate   sodium (SENOKOT-S) 8.6-50 MG tablet Take 2 tablets by mouth daily.    Marland Kitchen warfarin (COUMADIN) 5 MG tablet Take 1 tablet (5 mg total) by mouth daily. 90 tablet 1   No facility-administered medications prior to visit.     Allergies  Allergen Reactions  . Codeine Other (See Comments)  . Influenza Vaccines   . Sulfa Antibiotics Hives    Review of Systems  Constitutional: Negative.  Negative for chills and fever.  HENT: Negative.   Respiratory: Negative.  Negative for cough.   Cardiovascular: Negative.  Negative for chest pain and palpitations.  Gastrointestinal: Negative.   Skin: Negative.   Neurological: Negative.        Objective:    Physical Exam  Constitutional: She is oriented to person, place, and time. She appears well-developed and well-nourished.  HENT:  Head: Normocephalic and atraumatic.  Eyes: Pupils are equal, round, and reactive to light. Conjunctivae and EOM are normal.  Cardiovascular: Normal rate, regular  rhythm, normal heart sounds and intact distal pulses.  Pulmonary/Chest: Effort normal and breath sounds normal.  Abdominal: Soft. Bowel sounds are normal.  Neurological: She is alert and oriented to person, place, and time. She has normal reflexes.  Skin: Skin is warm and dry. No rash noted.  Psychiatric: She has a normal mood and affect. Her behavior is normal. Judgment and thought content normal.    There were no vitals taken for this visit. Wt Readings from Last 3 Encounters:  12/06/18 158 lb (71.7 kg)  11/04/18 153 lb (69.4 kg)  10/05/18 153 lb (69.4 kg)    Health Maintenance Due  Topic Date Due  . DEXA SCAN  11/29/1993  . PNA vac Low Risk Adult (1 of 2 - PCV13) 11/29/1993    There are no preventive care reminders to display for this patient.   No results found for: TSH No results found for: WBC, HGB, HCT, MCV, PLT No results found for: NA, K, CHLORIDE, CO2, GLUCOSE, BUN, CREATININE, BILITOT, ALKPHOS, AST, ALT, PROT, ALBUMIN, CALCIUM, ANIONGAP, EGFR, GFR No results found for: CHOL No results found for: HDL No results found for: LDLCALC No results found for: TRIG No results found for: CHOLHDL No results found for: HGBA1C     Assessment & Plan:   Problem List Items Addressed This Visit      Cardiovascular and Mediastinum   Atrial fibrillation ([place]) - Primary   Relevant Orders   inr FINGERSTICK       12/19/2018  Result 2.2  This is well controlled  Goal 2.0-3.0  Current dose 5 mg daily  Continue with this dose and plan to recheck in 2 week, patient is just moving over to Coumadin therapy.   Recheck 5 weeks    Terald Sleeper, PA-C

## 2018-12-22 NOTE — Progress Notes (Deleted)
 Acute Office Visit  Subjective:    Patient ID: Dominique Farley, female    DOB: 02/29/1926, 85 y.o.   MRN: [id number]   HPI  This patient comes in for an anticoagulation visit.     Indication: atrial fibrillation Bleeding signs/symptoms: None Thromboembolic signs/symptoms: None  Missed Coumadin doses: None Medication changes: no Dietary changes: no Bacterial/viral infection: no Other concerns: no  12/19/2018  Result 2.2  This is well controlled  Goal 2.0-3.0  Current dose 5 mg daily  Continue with this dose and plan to recheck in 2 week, patient is just moving over to Coumadin therapy.   Recheck 5 weeks   Past Medical History:  Diagnosis Date  . Hypertension   . Osteoporosis   . Tremor     Past Surgical History:  Procedure Laterality Date  . CHOLECYSTECTOMY      Family History  Problem Relation Age of Onset  . Dementia Mother   . Stroke Father   . Heart disease Father   . Diabetes Sister     Social History   Socioeconomic History  . Marital status: Married    Spouse name: Not on file  . Number of children: Not on file  . Years of education: Not on file  . Highest education level: Not on file  Occupational History  . Not on file  Social Needs  . Financial resource strain: Not on file  . Food insecurity    Worry: Not on file    Inability: Not on file  . Transportation needs    Medical: Not on file    Non-medical: Not on file  Tobacco Use  . Smoking status: Never Smoker  . Smokeless tobacco: Never Used  Substance and Sexual Activity  . Alcohol use: Never    Frequency: Never  . Drug use: Never  . Sexual activity: Not on file  Lifestyle  . Physical activity    Days per week: Not on file    Minutes per session: Not on file  . Stress: Not on file  Relationships  . Social connections    Talks on phone: Not on file    Gets together: Not on file    Attends religious service: Not on file    Active member of club or organization: Not on file   Attends meetings of clubs or organizations: Not on file    Relationship status: Not on file  . Intimate partner violence    Fear of current or ex partner: Not on file    Emotionally abused: Not on file    Physically abused: Not on file    Forced sexual activity: Not on file  Other Topics Concern  . Not on file  Social History Narrative  . Not on file    Outpatient Medications Prior to Visit  Medication Sig Dispense Refill  . amLODipine (NORVASC) 5 MG tablet Take 1 tablet (5 mg total) by mouth daily. 90 tablet 1  . aspirin EC 81 MG tablet Take 81 mg by mouth daily.    . atorvastatin (LIPITOR) 40 MG tablet Take 1 tablet (40 mg total) by mouth at bedtime. 90 tablet 1  . lisinopril (ZESTRIL) 20 MG tablet Take 1 tablet (20 mg total) by mouth daily. 90 tablet 1  . primidone (MYSOLINE) 50 MG tablet TAKE 1 TABLET BY MOUTH THREE TIMES DAILY AS DIRECTED    . propranolol (INDERAL) 60 MG tablet Take 30 mg by mouth 2 (two) times daily.    . sennosides-docusate   sodium (SENOKOT-S) 8.6-50 MG tablet Take 2 tablets by mouth daily.    Marland Kitchen warfarin (COUMADIN) 5 MG tablet Take 1 tablet (5 mg total) by mouth daily. 90 tablet 1   No facility-administered medications prior to visit.     Allergies  Allergen Reactions  . Codeine Other (See Comments)  . Influenza Vaccines   . Sulfa Antibiotics Hives    Review of Systems  Constitutional: Negative.  Negative for chills and fever.  HENT: Negative.   Respiratory: Negative.  Negative for cough.   Cardiovascular: Negative.  Negative for chest pain and palpitations.  Gastrointestinal: Negative.   Skin: Negative.   Neurological: Negative.        Objective:    Physical Exam  Constitutional: She is oriented to person, place, and time. She appears well-developed and well-nourished.  HENT:  Head: Normocephalic and atraumatic.  Eyes: Pupils are equal, round, and reactive to light. Conjunctivae and EOM are normal.  Cardiovascular: Normal rate, regular  rhythm, normal heart sounds and intact distal pulses.  Pulmonary/Chest: Effort normal and breath sounds normal.  Abdominal: Soft. Bowel sounds are normal.  Neurological: She is alert and oriented to person, place, and time. She has normal reflexes.  Skin: Skin is warm and dry. No rash noted.  Psychiatric: She has a normal mood and affect. Her behavior is normal. Judgment and thought content normal.    There were no vitals taken for this visit. Wt Readings from Last 3 Encounters:  12/06/18 158 lb (71.7 kg)  11/04/18 153 lb (69.4 kg)  10/05/18 153 lb (69.4 kg)    Health Maintenance Due  Topic Date Due  . DEXA SCAN  11/29/1993  . PNA vac Low Risk Adult (1 of 2 - PCV13) 11/29/1993    There are no preventive care reminders to display for this patient.   No results found for: TSH No results found for: WBC, HGB, HCT, MCV, PLT No results found for: NA, K, CHLORIDE, CO2, GLUCOSE, BUN, CREATININE, BILITOT, ALKPHOS, AST, ALT, PROT, ALBUMIN, CALCIUM, ANIONGAP, EGFR, GFR No results found for: CHOL No results found for: HDL No results found for: LDLCALC No results found for: TRIG No results found for: CHOLHDL No results found for: HGBA1C     Assessment & Plan:   Problem List Items Addressed This Visit      Cardiovascular and Mediastinum   Atrial fibrillation ([place]) - Primary   Relevant Orders   inr FINGERSTICK       12/19/2018  Result 2.2  This is well controlled  Goal 2.0-3.0  Current dose 5 mg daily  Continue with this dose and plan to recheck in 2 week, patient is just moving over to Coumadin therapy.   Recheck 5 weeks    Terald Sleeper, PA-C   The SHARE button shows that it is shared.  This note is not being shared with the patient for the following reason: To respect privacy (The patient or proxy has requested that the information not be shared).

## 2018-12-22 NOTE — Progress Notes (Signed)
 Acute Office Visit  Subjective:    Patient ID: Dominique Farley, female    DOB: 06/18/1926, 85 y.o.   MRN: [id number]   HPI  This patient comes in for an anticoagulation visit.     Indication: atrial fibrillation Bleeding signs/symptoms: None Thromboembolic signs/symptoms: None  Missed Coumadin doses: None Medication changes: no Dietary changes: no Bacterial/viral infection: no Other concerns: no  12/19/2018  Result 2.2  This is well controlled  Goal 2.0-3.0  Current dose 5 mg daily  Continue with this dose and plan to recheck in 2 week, patient is just moving over to Coumadin therapy.   Recheck 5 weeks   Past Medical History:  Diagnosis Date  . Hypertension   . Osteoporosis   . Tremor     Past Surgical History:  Procedure Laterality Date  . CHOLECYSTECTOMY      Family History  Problem Relation Age of Onset  . Dementia Mother   . Stroke Father   . Heart disease Father   . Diabetes Sister     Social History   Socioeconomic History  . Marital status: Married    Spouse name: Not on file  . Number of children: Not on file  . Years of education: Not on file  . Highest education level: Not on file  Occupational History  . Not on file  Social Needs  . Financial resource strain: Not on file  . Food insecurity    Worry: Not on file    Inability: Not on file  . Transportation needs    Medical: Not on file    Non-medical: Not on file  Tobacco Use  . Smoking status: Never Smoker  . Smokeless tobacco: Never Used  Substance and Sexual Activity  . Alcohol use: Never    Frequency: Never  . Drug use: Never  . Sexual activity: Not on file  Lifestyle  . Physical activity    Days per week: Not on file    Minutes per session: Not on file  . Stress: Not on file  Relationships  . Social connections    Talks on phone: Not on file    Gets together: Not on file    Attends religious service: Not on file    Active member of club or organization: Not on file   Attends meetings of clubs or organizations: Not on file    Relationship status: Not on file  . Intimate partner violence    Fear of current or ex partner: Not on file    Emotionally abused: Not on file    Physically abused: Not on file    Forced sexual activity: Not on file  Other Topics Concern  . Not on file  Social History Narrative  . Not on file    Outpatient Medications Prior to Visit  Medication Sig Dispense Refill  . amLODipine (NORVASC) 5 MG tablet Take 1 tablet (5 mg total) by mouth daily. 90 tablet 1  . aspirin EC 81 MG tablet Take 81 mg by mouth daily.    . atorvastatin (LIPITOR) 40 MG tablet Take 1 tablet (40 mg total) by mouth at bedtime. 90 tablet 1  . lisinopril (ZESTRIL) 20 MG tablet Take 1 tablet (20 mg total) by mouth daily. 90 tablet 1  . primidone (MYSOLINE) 50 MG tablet TAKE 1 TABLET BY MOUTH THREE TIMES DAILY AS DIRECTED    . propranolol (INDERAL) 60 MG tablet Take 30 mg by mouth 2 (two) times daily.    . sennosides-docusate   sodium (SENOKOT-S) 8.6-50 MG tablet Take 2 tablets by mouth daily.    Marland Kitchen warfarin (COUMADIN) 5 MG tablet Take 1 tablet (5 mg total) by mouth daily. 90 tablet 1   No facility-administered medications prior to visit.     Allergies  Allergen Reactions  . Codeine Other (See Comments)  . Influenza Vaccines   . Sulfa Antibiotics Hives    Review of Systems  Constitutional: Negative.  Negative for chills and fever.  HENT: Negative.   Respiratory: Negative.  Negative for cough.   Cardiovascular: Negative.  Negative for chest pain and palpitations.  Gastrointestinal: Negative.   Skin: Negative.   Neurological: Negative.        Objective:    Physical Exam  Constitutional: She is oriented to person, place, and time. She appears well-developed and well-nourished.  HENT:  Head: Normocephalic and atraumatic.  Eyes: Pupils are equal, round, and reactive to light. Conjunctivae and EOM are normal.  Cardiovascular: Normal rate, regular  rhythm, normal heart sounds and intact distal pulses.  Pulmonary/Chest: Effort normal and breath sounds normal.  Abdominal: Soft. Bowel sounds are normal.  Neurological: She is alert and oriented to person, place, and time. She has normal reflexes.  Skin: Skin is warm and dry. No rash noted.  Psychiatric: She has a normal mood and affect. Her behavior is normal. Judgment and thought content normal.    There were no vitals taken for this visit. Wt Readings from Last 3 Encounters:  12/06/18 158 lb (71.7 kg)  11/04/18 153 lb (69.4 kg)  10/05/18 153 lb (69.4 kg)    Health Maintenance Due  Topic Date Due  . DEXA SCAN  11/29/1993  . PNA vac Low Risk Adult (1 of 2 - PCV13) 11/29/1993    There are no preventive care reminders to display for this patient.   No results found for: TSH No results found for: WBC, HGB, HCT, MCV, PLT No results found for: NA, K, CHLORIDE, CO2, GLUCOSE, BUN, CREATININE, BILITOT, ALKPHOS, AST, ALT, PROT, ALBUMIN, CALCIUM, ANIONGAP, EGFR, GFR No results found for: CHOL No results found for: HDL No results found for: LDLCALC No results found for: TRIG No results found for: CHOLHDL No results found for: HGBA1C     Assessment & Plan:   Problem List Items Addressed This Visit      Cardiovascular and Mediastinum   Atrial fibrillation ([place]) - Primary   Relevant Orders   inr FINGERSTICK       12/19/2018  Result 2.2  This is well controlled  Goal 2.0-3.0  Current dose 5 mg daily  Continue with this dose and plan to recheck in 2 week, patient is just moving over to Coumadin therapy.   Recheck 5 weeks    Terald Sleeper, PA-C   The SHARE button shows that it is shared.  This note is not being shared with the patient for the following reason: To respect privacy (The patient or proxy has requested that the information not be shared).  This note is not being shared with the patient for the following reason: To respect privacy (The patient or proxy  has requested that the information not be shared).

## 2019-01-23 ENCOUNTER — Ambulatory Visit (INDEPENDENT_AMBULATORY_CARE_PROVIDER_SITE_OTHER): Payer: Medicare Other | Admitting: Physician Assistant

## 2019-01-23 ENCOUNTER — Encounter: Payer: Self-pay | Admitting: Physician Assistant

## 2019-01-23 ENCOUNTER — Other Ambulatory Visit: Payer: Self-pay

## 2019-01-23 DIAGNOSIS — I63512 Cerebral infarction due to unspecified occlusion or stenosis of left middle cerebral artery: Secondary | ICD-10-CM

## 2019-01-23 DIAGNOSIS — I4811 Longstanding persistent atrial fibrillation: Secondary | ICD-10-CM | POA: Diagnosis not present

## 2019-01-23 LAB — COAGUCHEK XS/INR WAIVED
INR: 1.5 — ABNORMAL HIGH (ref 0.9–1.1)
Prothrombin Time: 18.3 s

## 2019-01-23 MED ORDER — WARFARIN SODIUM 5 MG PO TABS: 5.0000 mg | ORAL_TABLET | Freq: Every day | ORAL | 1 refills | Status: DC

## 2019-01-23 NOTE — Patient Instructions (Signed)
01/22/2018 Result 1.5 Slightly undertreated Goal 2.0-3.0 Current dose 5 mg daily Increase to 1 and 1/2 tab Monday and Friday, and 1 tab all other days Recheck 3 weeks

## 2019-01-24 ENCOUNTER — Encounter: Payer: Self-pay | Admitting: Neurology

## 2019-01-24 ENCOUNTER — Ambulatory Visit: Payer: Medicare Other | Admitting: Neurology

## 2019-01-24 VITALS — BP 140/70 | HR 68 | Temp 97.5°F | Ht 60.0 in | Wt 158.0 lb

## 2019-01-24 DIAGNOSIS — G250 Essential tremor: Secondary | ICD-10-CM | POA: Diagnosis not present

## 2019-01-24 DIAGNOSIS — I4819 Other persistent atrial fibrillation: Secondary | ICD-10-CM

## 2019-01-24 DIAGNOSIS — I6602 Occlusion and stenosis of left middle cerebral artery: Secondary | ICD-10-CM

## 2019-01-24 NOTE — Patient Instructions (Signed)
I had a long discussion with the patient and her daughter regarding her recent left MCA infarct with new onset atrial fibrillation and she is doing remarkably well with excellent recovery and physical strength but does have residual cognitive and speech difficulties.  I recommend continue ongoing home physical occupational and speech therapy and continue warfarin for stroke prevention with target INR goal between 2 and 3.  Aggressive risk factor modification with blood pressure control and goal below 130/90, lipids with LDL cholesterol goal below 70 mg percent and diabetes with hemoglobin A1c goal below 6.5%.  She was advised to continue primidone and Inderal in the current dosages for essential tremor which appears stable.  Patient will need 24-hour supervision at home given her significant residual cognitive difficulties.  She will return for follow-up in 3 months or call earlier if necessary.  Stroke Prevention Some medical conditions and behaviors are associated with a higher chance of having a stroke. You can help prevent a stroke by making nutrition, lifestyle, and other changes, including managing any medical conditions you may have. What nutrition changes can be made?   Eat healthy foods. You can do this by: ? Choosing foods high in fiber, such as fresh fruits and vegetables and whole grains. ? Eating at least 5 or more servings of fruits and vegetables a day. Try to fill half of your plate at each meal with fruits and vegetables. ? Choosing lean protein foods, such as lean cuts of meat, poultry without skin, fish, tofu, beans, and nuts. ? Eating low-fat dairy products. ? Avoiding foods that are high in salt (sodium). This can help lower blood pressure. ? Avoiding foods that have saturated fat, trans fat, and cholesterol. This can help prevent high cholesterol. ? Avoiding processed and premade foods.  Follow your health care provider's specific guidelines for losing weight, controlling high  blood pressure (hypertension), lowering high cholesterol, and managing diabetes. These may include: ? Reducing your daily calorie intake. ? Limiting your daily sodium intake to 1,500 milligrams (mg). ? Using only healthy fats for cooking, such as olive oil, canola oil, or sunflower oil. ? Counting your daily carbohydrate intake. What lifestyle changes can be made?  Maintain a healthy weight. Talk to your health care provider about your ideal weight.  Get at least 30 minutes of moderate physical activity at least 5 days a week. Moderate activity includes brisk walking, biking, and swimming.  Do not use any products that contain nicotine or tobacco, such as cigarettes and e-cigarettes. If you need help quitting, ask your health care provider. It may also be helpful to avoid exposure to secondhand smoke.  Limit alcohol intake to no more than 1 drink a day for nonpregnant women and 2 drinks a day for men. One drink equals 12 oz of beer, 5 oz of wine, or 1 oz of hard liquor.  Stop any illegal drug use.  Avoid taking birth control pills. Talk to your health care provider about the risks of taking birth control pills if: ? You are over 71 years old. ? You smoke. ? You get migraines. ? You have ever had a blood clot. What other changes can be made?  Manage your cholesterol levels. ? Eating a healthy diet is important for preventing high cholesterol. If cholesterol cannot be managed through diet alone, you may also need to take medicines. ? Take any prescribed medicines to control your cholesterol as told by your health care provider.  Manage your diabetes. ? Eating a healthy diet  and exercising regularly are important parts of managing your blood sugar. If your blood sugar cannot be managed through diet and exercise, you may need to take medicines. ? Take any prescribed medicines to control your diabetes as told by your health care provider.  Control your hypertension. ? To reduce your risk  of stroke, try to keep your blood pressure below 130/80. ? Eating a healthy diet and exercising regularly are an important part of controlling your blood pressure. If your blood pressure cannot be managed through diet and exercise, you may need to take medicines. ? Take any prescribed medicines to control hypertension as told by your health care provider. ? Ask your health care provider if you should monitor your blood pressure at home. ? Have your blood pressure checked every year, even if your blood pressure is normal. Blood pressure increases with age and some medical conditions.  Get evaluated for sleep disorders (sleep apnea). Talk to your health care provider about getting a sleep evaluation if you snore a lot or have excessive sleepiness.  Take over-the-counter and prescription medicines only as told by your health care provider. Aspirin or blood thinners (antiplatelets or anticoagulants) may be recommended to reduce your risk of forming blood clots that can lead to stroke.  Make sure that any other medical conditions you have, such as atrial fibrillation or atherosclerosis, are managed. What are the warning signs of a stroke? The warning signs of a stroke can be easily remembered as BEFAST.  B is for balance. Signs include: ? Dizziness. ? Loss of balance or coordination. ? Sudden trouble walking.  E is for eyes. Signs include: ? A sudden change in vision. ? Trouble seeing.  F is for face. Signs include: ? Sudden weakness or numbness of the face. ? The face or eyelid drooping to one side.  A is for arms. Signs include: ? Sudden weakness or numbness of the arm, usually on one side of the body.  S is for speech. Signs include: ? Trouble speaking (aphasia). ? Trouble understanding.  T is for time. ? These symptoms may represent a serious problem that is an emergency. Do not wait to see if the symptoms will go away. Get medical help right away. Call your local emergency services  (911 in the [place] Do not drive yourself to the hospital.  Other signs of stroke may include: ? A sudden, severe headache with no known cause. ? Nausea or vomiting. ? Seizure. Where to find more information For more information, visit:  American Stroke Association: www.strokeassociation.org  National Stroke Association: www.stroke.org Summary  You can prevent a stroke by eating healthy, exercising, not smoking, limiting alcohol intake, and managing any medical conditions you may have.  Do not use any products that contain nicotine or tobacco, such as cigarettes and e-cigarettes. If you need help quitting, ask your health care provider. It may also be helpful to avoid exposure to secondhand smoke.  Remember BEFAST for warning signs of stroke. Get help right away if you or a loved one has any of these signs. This information is not intended to replace advice given to you by your health care provider. Make sure you discuss any questions you have with your health care provider. Document Revised: 12/11/2016 Document Reviewed: 02/04/2016 Elsevier Patient Education  2020 [employer].

## 2019-01-24 NOTE — Progress Notes (Signed)
.   Acute Office Visit  Subjective:    Patient ID: Dominique Farley, female    DOB: May 30, 1926, 86 y.o.   MRN: [id number]  Chief Complaint  Patient presents with  . Anticoagulation   This patient comes in for an anticoagulation visit.     Indication: atrial fibrillation Bleeding signs/symptoms: None Thromboembolic signs/symptoms: None  Missed Coumadin doses: None Medication changes: no Dietary changes: no Bacterial/viral infection: no Other concerns: no  01/22/2018 Result 1.5 Slightly undertreated Goal 2.0-3.0 Current dose 5 mg daily Increase to 1 and 1/2 tab Monday and Friday, and 1 tab all other days Recheck 3 weeks    Past Medical History:  Diagnosis Date  . Atrial fibrillation ([place])   . Hypertension   . Osteoporosis   . Stroke ([place])   . Tremor     Past Surgical History:  Procedure Laterality Date  . CHOLECYSTECTOMY      Family History  Problem Relation Age of Onset  . Dementia Mother   . Stroke Father   . Heart disease Father   . Diabetes Sister   . Stroke Son     Social History   Socioeconomic History  . Marital status: Married    Spouse name: Not on file  . Number of children: Not on file  . Years of education: Not on file  . Highest education level: Not on file  Occupational History  . Not on file  Tobacco Use  . Smoking status: Never Smoker  . Smokeless tobacco: Never Used  Substance and Sexual Activity  . Alcohol use: Never  . Drug use: Never  . Sexual activity: Not on file  Other Topics Concern  . Not on file  Social History Narrative  . Not on file   Social Determinants of Health   Financial Resource Strain:   . Difficulty of Paying Living Expenses: Not on file  Food Insecurity:   . Worried About Charity fundraiser in the Last Year: Not on file  . Ran Out of Food in the Last Year: Not on file  Transportation Needs:   . Lack of Transportation (Medical): Not on file  . Lack of Transportation (Non-Medical): Not on file    Physical Activity:   . Days of Exercise per Week: Not on file  . Minutes of Exercise per Session: Not on file  Stress:   . Feeling of Stress : Not on file  Social Connections:   . Frequency of Communication with Friends and Family: Not on file  . Frequency of Social Gatherings with Friends and Family: Not on file  . Attends Religious Services: Not on file  . Active Member of Clubs or Organizations: Not on file  . Attends Archivist Meetings: Not on file  . Marital Status: Not on file  Intimate Partner Violence:   . Fear of Current or Ex-Partner: Not on file  . Emotionally Abused: Not on file  . Physically Abused: Not on file  . Sexually Abused: Not on file    Outpatient Medications Prior to Visit  Medication Sig Dispense Refill  . amLODipine (NORVASC) 5 MG tablet Take 1 tablet (5 mg total) by mouth daily. 90 tablet 1  . aspirin EC 81 MG tablet Take 81 mg by mouth daily. Every other day    . atorvastatin (LIPITOR) 40 MG tablet Take 1 tablet (40 mg total) by mouth at bedtime. 90 tablet 1  . lisinopril (ZESTRIL) 20 MG tablet Take 1 tablet (20 mg total) by  mouth daily. 90 tablet 1  . primidone (MYSOLINE) 50 MG tablet TAKE 1 TABLET BY MOUTH THREE TIMES DAILY AS DIRECTED    . propranolol (INDERAL) 60 MG tablet Take 30 mg by mouth 2 (two) times daily.    . sennosides-docusate sodium (SENOKOT-S) 8.6-50 MG tablet Take 2 tablets by mouth daily.    Marland Kitchen warfarin (COUMADIN) 5 MG tablet Take 1 tablet (5 mg total) by mouth daily. 90 tablet 1   No facility-administered medications prior to visit.    Allergies  Allergen Reactions  . Codeine Other (See Comments)  . Influenza Vaccines   . Sulfa Antibiotics Hives    Review of Systems  Constitutional: Negative.   HENT: Negative.   Eyes: Negative.   Respiratory: Negative.   Gastrointestinal: Negative.   Genitourinary: Negative.        Objective:    Physical Exam Constitutional:      General: She is not in acute distress.     Appearance: Normal appearance. She is well-developed.  HENT:     Head: Normocephalic and atraumatic.  Cardiovascular:     Rate and Rhythm: Normal rate.  Pulmonary:     Effort: Pulmonary effort is normal.  Skin:    General: Skin is warm and dry.     Findings: No rash.  Neurological:     Mental Status: She is alert and oriented to person, place, and time.     Deep Tendon Reflexes: Reflexes are normal and symmetric.     BP 139/80   Pulse 79   Temp 98 F (36.7 C) (Temporal)   Ht 5' 1"  (1.549 m)   Wt 156 lb 9.6 oz (71 kg)   SpO2 97%   BMI 29.59 kg/m  Wt Readings from Last 3 Encounters:  01/24/19 158 lb (71.7 kg)  01/23/19 156 lb 9.6 oz (71 kg)  12/19/18 159 lb 12.8 oz (72.5 kg)    Health Maintenance Due  Topic Date Due  . DEXA SCAN  11/29/1993  . PNA vac Low Risk Adult (1 of 2 - PCV13) 11/29/1993    There are no preventive care reminders to display for this patient.   No results found for: TSH No results found for: WBC, HGB, HCT, MCV, PLT No results found for: NA, K, CHLORIDE, CO2, GLUCOSE, BUN, CREATININE, BILITOT, ALKPHOS, AST, ALT, PROT, ALBUMIN, CALCIUM, ANIONGAP, EGFR, GFR No results found for: CHOL No results found for: HDL No results found for: LDLCALC No results found for: TRIG No results found for: CHOLHDL No results found for: HGBA1C     Assessment & Plan:   Problem List Items Addressed This Visit      Cardiovascular and Mediastinum   Atrial fibrillation ([place])   Relevant Medications   warfarin (COUMADIN) 5 MG tablet   Cerebrovascular accident (CVA) due to occlusion of left middle cerebral artery (HCC)   Relevant Medications   warfarin (COUMADIN) 5 MG tablet       Meds ordered this encounter  Medications  . warfarin (COUMADIN) 5 MG tablet    Sig: Take 1-1.5 tablets (5-7.5 mg total) by mouth daily. As directed    Dispense:  120 tablet    Refill:  1    Order Specific Question:   Supervising Provider    Answer:   [name] [[id number]]       Terald Sleeper, PA-C

## 2019-01-24 NOTE — Progress Notes (Signed)
[facility] [address] [place]. [place] [address] [phone]       OFFICE CONSULT NOTE  Ms. Dominique Farley Date of Birth:  July 29, 1926 Medical Record Number:  [id number]   Referring MD: Particia Nearing, PA-C  Reason for Referral: Stroke  HPI: Ms. Dominique Farley is a pleasant 86 year old Caucasian lady with past medical history of hypertension who is seen today for initial office visit following admission in October 2020 for stroke.  History is obtained from her, her granddaughter is accompanying her, review of electronic medical records and care everywhere I have reviewed imaging films reports though actual films were not available for review personally.  She presented on 10/18/2018 to [facility] where she was found lying on the floor with aphasia and right-sided weakness.  NIH stroke scale was 13 on admission.  She presented beyond time window for IV TPA.  Teleneurology consultation was obtained and CT angiogram suggested a left M1 occlusion.  Patient was airlifted to [facility] in [place] and underwent emergent thrombectomy by Dr. Dorthea Cove.  MRI scan showed left caudate head, corona radiata centrum semiovale and external capsule infarct.  Echocardiogram carotid Dopplers were unremarkable.  Patient was found to be in new onset atrial fibrillation for which she was started on warfarin.  She has continued on atorvastatin.  Hospital course was complicated by UTI from E. coli for which she was treated with antibiotics.  Patient showed improvement in inpatient rehab was suggested but insurance denied it and patient daughter insisted on taking her home.  She has been home living with her daughter.  She has been getting home physical occupational and speech therapy.  She is done well she is recently been discharged from occupational therapy with still getting physical therapy.  She can walk short distances independently without assistance but does use a walker  for long distances.  She also is getting speech therapy and speech is improving.  She had significant dysphagia which also appears to have improved.  The daughter states that from physical standpoint she has made good recovery from a stroke but from cognitive standpoint she is at significant memory loss and cognitive impairment post stroke.  Patient cannot be left alone.  There is a caregiver overnight and during the day one of her daughters lives with her.  She also has benign essential tremor for several years and there is a strong family history of the same.  She has been using primidone and Inderal and the tremor appears to be stable over the years.  She is tolerating warfarin well though her INR has been fluctuating and yesterday was 1.5 and medication dosage has been increased by her primary physician.  Blood pressure is well controlled.  ROS:   14 system review of systems is positive for speech difficulties, word finding difficulties, memory loss, gait imbalance, hearing loss and all other systems negative PMH:  Past Medical History:  Diagnosis Date  . Atrial fibrillation ([place])   . Hypertension   . Osteoporosis   . Stroke ([place])   . Tremor     Social History:  Social History   Socioeconomic History  . Marital status: Married    Spouse name: Not on file  . Number of children: Not on file  . Years of education: Not on file  . Highest education level: Not on file  Occupational History  . Not on file  Tobacco Use  . Smoking status: Never Smoker  . Smokeless tobacco: Never Used  Substance  and Sexual Activity  . Alcohol use: Never  . Drug use: Never  . Sexual activity: Not on file  Other Topics Concern  . Not on file  Social History Narrative  . Not on file   Social Determinants of Health   Financial Resource Strain:   . Difficulty of Paying Living Expenses: Not on file  Food Insecurity:   . Worried About Charity fundraiser in the Last Year: Not on file  . Ran Out of Food  in the Last Year: Not on file  Transportation Needs:   . Lack of Transportation (Medical): Not on file  . Lack of Transportation (Non-Medical): Not on file  Physical Activity:   . Days of Exercise per Week: Not on file  . Minutes of Exercise per Session: Not on file  Stress:   . Feeling of Stress : Not on file  Social Connections:   . Frequency of Communication with Friends and Family: Not on file  . Frequency of Social Gatherings with Friends and Family: Not on file  . Attends Religious Services: Not on file  . Active Member of Clubs or Organizations: Not on file  . Attends Archivist Meetings: Not on file  . Marital Status: Not on file  Intimate Partner Violence:   . Fear of Current or Ex-Partner: Not on file  . Emotionally Abused: Not on file  . Physically Abused: Not on file  . Sexually Abused: Not on file    Medications:   Current Outpatient Medications on File Prior to Visit  Medication Sig Dispense Refill  . amLODipine (NORVASC) 5 MG tablet Take 1 tablet (5 mg total) by mouth daily. 90 tablet 1  . aspirin EC 81 MG tablet Take 81 mg by mouth daily. Every other day    . atorvastatin (LIPITOR) 40 MG tablet Take 1 tablet (40 mg total) by mouth at bedtime. 90 tablet 1  . lisinopril (ZESTRIL) 20 MG tablet Take 1 tablet (20 mg total) by mouth daily. 90 tablet 1  . primidone (MYSOLINE) 50 MG tablet TAKE 1 TABLET BY MOUTH THREE TIMES DAILY AS DIRECTED    . propranolol (INDERAL) 60 MG tablet Take 30 mg by mouth 2 (two) times daily.    . sennosides-docusate sodium (SENOKOT-S) 8.6-50 MG tablet Take 2 tablets by mouth daily.    Marland Kitchen warfarin (COUMADIN) 5 MG tablet Take 1-1.5 tablets (5-7.5 mg total) by mouth daily. As directed 120 tablet 1   No current facility-administered medications on file prior to visit.    Allergies:   Allergies  Allergen Reactions  . Codeine Other (See Comments)  . Influenza Vaccines   . Sulfa Antibiotics Hives    Physical Exam General: well  developed, well nourished elderly Caucasian lady, seated, in no evident distress Head: head normocephalic and atraumatic.   Neck: supple with no carotid or supraclavicular bruits Cardiovascular: regular rate and rhythm, no murmurs Musculoskeletal: no deformity Skin:  no rash/petichiae Vascular:  Normal pulses all extremities  Neurologic Exam Mental Status: Awake and fully alert. Oriented to place and time. Recent and remote memory poor attention span, concentration and fund of knowledge diminished mood and affect appropriate.  Recall 1/3.  Slightly nonfluent speech with occasional word hesitancy and word finding difficulty.  Good comprehension and naming and repetition  cranial Nerves: Fundoscopic exam reveals sharp disc margins. Pupils equal, briskly reactive to light. Extraocular movements full without nystagmus. Visual fields full to confrontation. Hearing intact. Facial sensation intact.  Mild right lower facial  weakness, tongue, palate moves normally and symmetrically.  Motor: Normal bulk and tone. Normal strength in all tested extremity muscles except diminished fine finger movements on the right.  Orbits left over right upper extremity.  Mild weakness of right hip flexors and ankle dorsiflexors only.  Tone is slightly increased on the right compared to the left. Sensory.:  diminished touch , pinprick , position and vibratory sensation on the right hemibody.  Coordination: Rapid alternating movements normal in all extremities. Finger-to-nose and heel-to-shin performed accurately bilaterally. Gait and Station: Arises from chair without difficulty. Stance is slightly stooped. Gait demonstrates slight dragging of the right leg..  Unable to heel, toe and tandem walk .  Reflexes: 1+ and symmetric. Toes downgoing.   NIHSS  5 Modified Rankin  4   ASSESSMENT: 86 year old Caucasian lady with embolic left MCA infarct and October 2020 secondary to left middle cerebral artery occlusion from new onset  atrial fibrillation treated with successful mechanical thrombectomy with good clinical outcome.  She has residual vascular cognitive impairment and minimal right-sided weakness       PLAN: I had a long discussion with the patient and her daughter regarding her recent left MCA infarct with new onset atrial fibrillation and she is doing remarkably well with excellent recovery and physical strength but does have residual cognitive and speech difficulties.  I recommend continue ongoing home physical occupational and speech therapy and continue warfarin for stroke prevention with target INR goal between 2 and 3.  Aggressive risk factor modification with blood pressure control and goal below 130/90, lipids with LDL cholesterol goal below 70 mg percent and diabetes with hemoglobin A1c goal below 6.5%.  She was advised to continue primidone and Inderal in the current dosages for essential tremor which appears stable.  Patient will need 24-hour supervision at home given her significant residual cognitive difficulties.  Greater than 50% time during this 45-minute consultation visit was spent on counseling and coordination of care about her embolic stroke, atrial fibrillation and vascular cognitive impairment and answering questions she will return for follow-up in 3 months or call earlier if necessary. Antony Contras, MD  [facility] [address] [place], [place] [address]  Phone [phone] Fax [phone]  Note: This document was prepared with digital dictation and possible smart phrase technology. Any transcriptional errors that result from this process are unintentional.

## 2019-02-17 ENCOUNTER — Other Ambulatory Visit: Payer: Self-pay

## 2019-02-20 ENCOUNTER — Ambulatory Visit (INDEPENDENT_AMBULATORY_CARE_PROVIDER_SITE_OTHER): Payer: Medicare Other | Admitting: Physician Assistant

## 2019-02-20 ENCOUNTER — Encounter: Payer: Self-pay | Admitting: Physician Assistant

## 2019-02-20 ENCOUNTER — Other Ambulatory Visit: Payer: Self-pay

## 2019-02-20 VITALS — BP 158/66 | HR 69 | Temp 96.8°F | Ht 61.0 in | Wt 153.1 lb

## 2019-02-20 DIAGNOSIS — K5904 Chronic idiopathic constipation: Secondary | ICD-10-CM | POA: Diagnosis not present

## 2019-02-20 DIAGNOSIS — I4811 Longstanding persistent atrial fibrillation: Secondary | ICD-10-CM

## 2019-02-20 DIAGNOSIS — I63512 Cerebral infarction due to unspecified occlusion or stenosis of left middle cerebral artery: Secondary | ICD-10-CM | POA: Diagnosis not present

## 2019-02-20 LAB — COAGUCHEK XS/INR WAIVED
INR: 3.7 — ABNORMAL HIGH (ref 0.9–1.1)
Prothrombin Time: 44.2 s

## 2019-02-20 MED ORDER — POLYETHYLENE GLYCOL 3350 17 GM/SCOOP PO POWD: 17.0000 g | Freq: Once | ORAL | 11 refills | Status: AC

## 2019-02-20 NOTE — Patient Instructions (Signed)
02/19/2018 Result 3.7 Slightly overtreated Goal 2.0-3.0 Current dose: 1 and 1/2 tab Monday and Friday, and 1 tab all other days New dose: 1 and 1/2 tab on Friday, 1 all other days Recheck 3 weeks

## 2019-02-20 NOTE — Progress Notes (Signed)
Acute Office Visit  Subjective:    Patient ID: Dominique Farley, female    DOB: 02-21-1926, 86 y.o.   MRN: [id number]  Chief Complaint  Patient presents with  . Medical Management of Chronic Issues  . Atrial Fibrillation    Atrial Fibrillation Presents for follow-up visit. Symptoms are negative for chest pain, dizziness, hemodynamic instability, hypertension, hypotension and shortness of breath. The symptoms have been resolved. Past medical history includes atrial fibrillation. There are no medication compliance problems.  Constipation This is a chronic problem. The current episode started more than 1 year ago. The problem has been gradually worsening since onset. Her stool frequency is 2 to 3 times per week. The stool is described as firm and formed. The patient is on a high fiber diet. She exercises regularly. There has been adequate water intake. Pertinent negatives include no fecal incontinence, fever, melena, nausea or rectal pain. She has tried fiber for the symptoms. The treatment provided mild relief.  This patient comes in for an anticoagulation visit.     Indication: atrial fibrillation Bleeding signs/symptoms: None Thromboembolic signs/symptoms: None  Missed Coumadin doses: None Medication changes: no Dietary changes: no Bacterial/viral infection: no Other concerns: no   Past Medical History:  Diagnosis Date  . Atrial fibrillation ([place])   . BCC (basal cell carcinoma of skin) 11/14/2004   Right Mid Cheek(Cx3,5FU)  . BCC (basal cell carcinoma of skin) 04/16/2011   Right Crease(tx p bx)  . BCC (basal cell carcinoma of skin) 06/16/2016   Left Nare(tx p bx)  . BCC (basal cell carcinoma of skin) Atypical Basaloid 09/29/2018   Left Side Nose(tx p bx)  . Hypertension   . Osteoporosis   . SCC (squamous cell carcinoma) 09/29/2018   Left Jawline(tx p bx)  . 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   Right Jawline(Cx3,5FU) and Left Upper Cheek(Cx3,5FU)  . SCC (squamous  cell carcinoma) x 4 06/16/2016   Left Jawline(tx p bx), Nose(tx p bx), Right Cheek(tx p bx), and Right Forearm(tx p bx)  . 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   Right [place](Cx3,5FU) and Left Outer Eye(Cx3,5FU)  . 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   Right Forearm(Cx3,5FU)  . Squamous cell carcinoma in situ (SCCIS) 03/13/2005   Left Side of Nose(Cx3,5FU)  . Squamous cell carcinoma in situ (SCCIS) 06/04/2005   Inner Right Cheek/Eye(Tx p Bx)  . Squamous cell carcinoma in situ (SCCIS) 02/12/2010   Right Cheek(tx p bx)  . 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 Stroke ([place])   . Tremor     Past Surgical History:  Procedure Laterality Date  . CHOLECYSTECTOMY      Family History  Problem Relation Age of Onset  . Dementia Mother   . Stroke Father   . Heart disease Father   . Diabetes Sister   . Stroke Son     Social History   Socioeconomic History  . Marital status: Married    Spouse name: Not on file  . Number of children: Not on file  . Years of education: Not on file  . Highest education level: Not on file  Occupational History  . Not on file  Tobacco Use  . Smoking status: Never Smoker  . Smokeless tobacco: Never Used  Substance and Sexual Activity  . Alcohol use: Never  . Drug use: Never  . Sexual activity: Not on file  Other Topics Concern  . Not on file  Social History Narrative  . Not on file   Social Determinants of Health   Financial Resource Strain:   . Difficulty of Paying Living Expenses: Not on file  Food Insecurity:   . Worried About Charity fundraiser in the Last Year: Not on file  . Ran Out of Food in the Last Year: Not on file  Transportation Needs:   . Lack of Transportation (Medical): Not on file  . Lack of Transportation  (Non-Medical): Not on file  Physical Activity:   . Days of Exercise per Week: Not on file  . Minutes of Exercise per Session: Not on file  Stress:   . Feeling of Stress : Not on file  Social Connections:   . Frequency of Communication with Friends and Family: Not on file  . Frequency of Social Gatherings with Friends and Family: Not on file  . Attends Religious Services: Not on file  . Active Member of Clubs or Organizations: Not on file  . Attends Archivist Meetings: Not on file  . Marital Status: Not on file  Intimate Partner Violence:   . Fear of Current or Ex-Partner: Not on file  . Emotionally Abused: Not on file  . Physically Abused: Not on file  . Sexually Abused: Not on file    Outpatient Medications Prior to Visit  Medication Sig Dispense Refill  . amLODipine (NORVASC) 5 MG tablet Take 1 tablet (5 mg total) by mouth daily. 90 tablet 1  . aspirin EC 81 MG tablet Take 81 mg by mouth daily. Every other day    . atorvastatin (LIPITOR) 40 MG tablet Take 1 tablet (40 mg total) by mouth at bedtime. 90 tablet 1  . lisinopril (ZESTRIL) 20 MG tablet Take 1 tablet (20 mg total) by mouth daily. 90 tablet 1  . primidone (MYSOLINE) 50 MG tablet TAKE 1 TABLET BY MOUTH THREE TIMES DAILY AS DIRECTED    . propranolol (INDERAL) 60 MG tablet Take 30 mg by mouth 2 (two) times daily.    . sennosides-docusate sodium (SENOKOT-S) 8.6-50 MG tablet Take 2 tablets by mouth daily.    Marland Kitchen warfarin (COUMADIN) 5 MG tablet Take 1-1.5 tablets (5-7.5 mg total) by mouth daily. As directed 120 tablet 1   No facility-administered medications prior to visit.    Allergies  Allergen Reactions  . Codeine Other (See Comments)  . Influenza Vaccines   . Sulfa Antibiotics Hives    Review of Systems  Constitutional: Negative.  Negative for fever.  HENT: Negative.   Eyes: Negative.   Respiratory: Negative.  Negative for shortness of breath.   Cardiovascular: Negative for chest pain.    Gastrointestinal: Positive for constipation. Negative for melena, nausea and rectal pain.  Genitourinary: Negative.   Neurological: Negative for dizziness.       Objective:    Physical Exam Constitutional:      General: She is not in acute distress.    Appearance: Normal appearance. She is well-developed.  HENT:     Head: Normocephalic and atraumatic.  Cardiovascular:     Rate and Rhythm: Normal rate.  Pulmonary:     Effort: Pulmonary effort is normal.  Skin:    General: Skin is warm and dry.     Findings: No rash.  Neurological:     Mental Status: She  is alert and oriented to person, place, and time.     Deep Tendon Reflexes: Reflexes are normal and symmetric.     BP (!) 158/66   Pulse 69   Temp (!) 96.8 F (36 C)   Ht _0  (1.549 m)   Wt 153 lb 2 oz (69.5 kg)   SpO2 97%   BMI 28.93 kg/m  Wt Readings from Last 3 Encounters:  02/20/19 153 lb 2 oz (69.5 kg)  01/24/19 158 lb (71.7 kg)  01/23/19 156 lb 9.6 oz (71 kg)    Health Maintenance Due  Topic Date Due  . DEXA SCAN  11/29/1993  . PNA vac Low Risk Adult (1 of 2 - PCV13) 11/29/1993    There are no preventive care reminders to display for this patient.   No results found for: TSH No results found for: WBC, HGB, HCT, MCV, PLT No results found for: NA, K, CHLORIDE, CO2, GLUCOSE, BUN, CREATININE, BILITOT, ALKPHOS, AST, ALT, PROT, ALBUMIN, CALCIUM, ANIONGAP, EGFR, GFR No results found for: CHOL No results found for: HDL No results found for: LDLCALC No results found for: TRIG No results found for: CHOLHDL No results found for: HGBA1C     Assessment & Plan:   1. Longstanding persistent atrial fibrillation (HCC) - inr FINGERSTICK  2. Cerebrovascular accident (CVA) due to occlusion of left middle cerebral artery (HCC) - inr FINGERSTICK  3. Chronic idiopathic constipation - polyethylene glycol powder (GLYCOLAX/MIRALAX) 17 GM/SCOOP powder; Take 17 g by mouth once for 1 dose.  Dispense: 3350 g; Refill:  11  Meds ordered this encounter  Medications  . polyethylene glycol powder (GLYCOLAX/MIRALAX) 17 GM/SCOOP powder    Sig: Take 17 g by mouth once for 1 dose.    Dispense:  3350 g    Refill:  11    Order Specific Question:   Supervising Provider    Answer:   [name] [[id number]]   02/19/2018 Result 3.7 Slightly overtreated Goal 2.0-3.0 Current dose: 1 and 1/2 tab Monday and Friday, and 1 tab all other days New dose: 1 and 1/2 tab on Friday, 1 all other days Recheck 3 weeks   Terald Sleeper, PA-C

## 2019-03-13 ENCOUNTER — Other Ambulatory Visit: Payer: Self-pay

## 2019-03-14 ENCOUNTER — Ambulatory Visit (INDEPENDENT_AMBULATORY_CARE_PROVIDER_SITE_OTHER): Payer: Medicare Other | Admitting: Physician Assistant

## 2019-03-14 ENCOUNTER — Encounter: Payer: Self-pay | Admitting: Physician Assistant

## 2019-03-14 DIAGNOSIS — I63512 Cerebral infarction due to unspecified occlusion or stenosis of left middle cerebral artery: Secondary | ICD-10-CM

## 2019-03-14 DIAGNOSIS — I4811 Longstanding persistent atrial fibrillation: Secondary | ICD-10-CM

## 2019-03-14 LAB — COAGUCHEK XS/INR WAIVED
INR: 2.6 — ABNORMAL HIGH (ref 0.9–1.1)
Prothrombin Time: 31 s

## 2019-03-14 NOTE — Progress Notes (Signed)
BP (!) 157/75   Pulse 63   Temp (!) 97 F (36.1 C)   Ht 5\' 1"  (1.549 m)   Wt 155 lb 6.4 oz (70.5 kg)   SpO2 97%   BMI 29.36 kg/m    Subjective:    Patient ID: Dominique Farley, female    DOB: Jun 08, 1926, 86 y.o.   MRN: [id number]  HPI: Dominique Farley is a 86 y.o. female presenting on 03/14/2019 for No chief complaint on file.   This patient comes in for an anticoagulation visit.     Indication: atrial fibrillation Bleeding signs/symptoms: None Thromboembolic signs/symptoms: None  Missed Coumadin doses: None Medication changes: no Dietary changes: no Bacterial/viral infection: no Other concerns: no   03/14/2018 Result 2.6 good Goal 2.0-3.0 Current dose:  1 and 1/2 tab on Friday, 1 all other days Continue  Recheck 3 weeks  Past Medical History:  Diagnosis Date  . Atrial fibrillation ([employer])   . BCC (basal cell carcinoma of skin) 11/14/2004   Right Mid Cheek(Cx3,5FU)  . BCC (basal cell carcinoma of skin) 04/16/2011   Right Crease(tx p bx)  . BCC (basal cell carcinoma of skin) 06/16/2016   Left Nare(tx p bx)  . BCC (basal cell carcinoma of skin) Atypical Basaloid 09/29/2018   Left Side Nose(tx p bx)  . Hypertension   . Osteoporosis   . SCC (squamous cell carcinoma) 09/29/2018   Left Jawline(tx p bx)  . 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   Right Jawline(Cx3,5FU) and Left Upper Cheek(Cx3,5FU)  . 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   Left Jawline(tx p bx), Nose(tx p bx), Right Cheek(tx p bx), and Right Forearm(tx p bx)  . 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   Right Temple(Cx3,5FU) and Left Outer Eye(Cx3,5FU)  . 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   Right Forearm(Cx3,5FU)  . Squamous cell carcinoma in situ (SCCIS) 03/13/2005   Left Side of Nose(Cx3,5FU)  . Squamous cell carcinoma in situ (SCCIS) 06/04/2005   Inner Right Cheek/Eye(Tx p Bx)  . Squamous cell carcinoma in situ (SCCIS) 02/12/2010   Right Cheek(tx p bx)    . 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 Stroke ([place])   . Tremor    Relevant past medical, surgical, family and social history reviewed and updated as indicated. Interim medical history since our last visit reviewed. Allergies and medications reviewed and updated. DATA REVIEWED: CHART IN EPIC  Family History reviewed for pertinent findings.  Review of Systems  Allergies as of 03/14/2019      Reactions   Codeine Other (See Comments)   Influenza Vaccines    Sulfa Antibiotics Hives      Medication List       Accurate as of March 14, 2019 11:59 PM. If you have any questions, ask your nurse or doctor.        amLODipine 5 MG tablet Commonly known as: NORVASC Take 1 tablet (5 mg total) by mouth daily.   aspirin EC 81 MG tablet Take 81 mg by mouth daily. Every other day   atorvastatin 40 MG tablet Commonly known as: LIPITOR Take 1 tablet (40 mg total) by mouth at bedtime.   lisinopril 20 MG tablet Commonly known as: ZESTRIL Take 1 tablet (20  mg total) by mouth daily.   primidone 50 MG tablet Commonly known as: MYSOLINE TAKE 1 TABLET BY MOUTH THREE TIMES DAILY AS DIRECTED   propranolol 60 MG tablet Commonly known as: INDERAL Take 30 mg by mouth 2 (two) times daily.   sennosides-docusate sodium 8.6-50 MG tablet Commonly known as: SENOKOT-S Take 2 tablets by mouth daily.   warfarin 5 MG tablet Commonly known as: COUMADIN Take as directed by the anticoagulation clinic. If you are unsure how to take this medication, talk to your nurse or doctor. Original instructions: Take 1-1.5 tablets (5-7.5 mg total) by mouth daily. As directed          Objective:    BP (!) 157/75   Pulse 63   Temp (!) 97 F (36.1 C)   Ht 5\' 1"  (1.549 m)   Wt 155 lb 6.4 oz (70.5 kg)   SpO2 97%   BMI 29.36  kg/m   Allergies  Allergen Reactions  . Codeine Other (See Comments)  . Influenza Vaccines   . Sulfa Antibiotics Hives    Wt Readings from Last 3 Encounters:  03/14/19 155 lb 6.4 oz (70.5 kg)  02/20/19 153 lb 2 oz (69.5 kg)  01/24/19 158 lb (71.7 kg)    Physical Exam  Results for orders placed or performed in visit on 03/14/19  inr FINGERSTICK  Result Value Ref Range   INR 2.6 (H) 0.9 - 1.1   Prothrombin Time 31.0 sec      Assessment & Plan:   1. Longstanding persistent atrial fibrillation (HCC) - inr FINGERSTICK  2. Cerebrovascular accident (CVA) due to occlusion of left middle cerebral artery (HCC) - inr FINGERSTICK   Continue all other maintenance medications as listed above.  Follow up plan: Return in about 5 weeks (around 04/18/2019) for use an urgent spot. INR check.  Educational handout given for  [place] PA-C [place] [address]  [place], [place] [address] [phone]   03/23/2019, 10:57 AM

## 2019-03-28 DIAGNOSIS — M79671 Pain in right foot: Secondary | ICD-10-CM | POA: Diagnosis not present

## 2019-03-28 DIAGNOSIS — L110 Acquired keratosis follicularis: Secondary | ICD-10-CM | POA: Diagnosis not present

## 2019-03-28 DIAGNOSIS — I739 Peripheral vascular disease, unspecified: Secondary | ICD-10-CM | POA: Diagnosis not present

## 2019-03-28 DIAGNOSIS — M79672 Pain in left foot: Secondary | ICD-10-CM | POA: Diagnosis not present

## 2019-04-05 ENCOUNTER — Ambulatory Visit: Payer: Medicare Other

## 2019-04-07 DIAGNOSIS — H6123 Impacted cerumen, bilateral: Secondary | ICD-10-CM | POA: Diagnosis not present

## 2019-04-17 ENCOUNTER — Ambulatory Visit (INDEPENDENT_AMBULATORY_CARE_PROVIDER_SITE_OTHER): Payer: Medicare Other | Admitting: Family Medicine

## 2019-04-17 ENCOUNTER — Other Ambulatory Visit: Payer: Self-pay

## 2019-04-17 ENCOUNTER — Encounter: Payer: Self-pay | Admitting: Family Medicine

## 2019-04-17 ENCOUNTER — Ambulatory Visit: Payer: Medicare Other | Admitting: Physician Assistant

## 2019-04-17 VITALS — BP 158/62 | HR 73 | Temp 97.8°F | Ht 61.0 in | Wt 155.1 lb

## 2019-04-17 DIAGNOSIS — Z5181 Encounter for therapeutic drug level monitoring: Secondary | ICD-10-CM | POA: Diagnosis not present

## 2019-04-17 DIAGNOSIS — Z7901 Long term (current) use of anticoagulants: Secondary | ICD-10-CM | POA: Diagnosis not present

## 2019-04-17 DIAGNOSIS — I4811 Longstanding persistent atrial fibrillation: Secondary | ICD-10-CM | POA: Diagnosis not present

## 2019-04-17 DIAGNOSIS — I6930 Unspecified sequelae of cerebral infarction: Secondary | ICD-10-CM | POA: Diagnosis not present

## 2019-04-17 DIAGNOSIS — I63512 Cerebral infarction due to unspecified occlusion or stenosis of left middle cerebral artery: Secondary | ICD-10-CM

## 2019-04-17 LAB — COAGUCHEK XS/INR WAIVED
INR: 1.7 — ABNORMAL HIGH (ref 0.9–1.1)
Prothrombin Time: 20.2 s

## 2019-04-17 NOTE — Progress Notes (Signed)
BP (!) 158/62   Pulse 73   Temp 97.8 F (36.6 C)   Ht 5\' 1"  (1.549 m)   Wt 155 lb 2 oz (70.4 kg)   SpO2 94%   BMI 29.31 kg/m    Subjective:   Patient ID: Dominique Farley, female    DOB: Mar 16, 1926, 86 y.o.   MRN: [id number]  HPI: Dominique Farley is a 86 y.o. female presenting on 04/17/2019 for Coagulation Disorder   HPI Coumadin recheck Target goal: 2.0-3.0 Reason on anticoagulation: A. fib Patient denies any bruising or bleeding or chest pain or palpitations   Patient has a history of CVA and is coming in for recheck, currently takes aspirin along with Coumadin for the history of CVA.  Relevant past medical, surgical, family and social history reviewed and updated as indicated. Interim medical history since our last visit reviewed. Allergies and medications reviewed and updated.  Review of Systems  Constitutional: Negative for chills and fever.  Respiratory: Negative for chest tightness and shortness of breath.   Cardiovascular: Negative for chest pain and leg swelling.  Gastrointestinal: Negative for blood in stool.  Genitourinary: Negative for hematuria.  All other systems reviewed and are negative.   Per HPI unless specifically indicated above   Allergies as of 04/17/2019      Reactions   Codeine Other (See Comments)   Influenza Vaccines    Sulfa Antibiotics Hives      Medication List       Accurate as of April 17, 2019 11:59 PM. If you have any questions, ask your nurse or doctor.        amLODipine 5 MG tablet Commonly known as: NORVASC Take 1 tablet (5 mg total) by mouth daily.   aspirin EC 81 MG tablet Take 81 mg by mouth daily. Every other day   atorvastatin 40 MG tablet Commonly known as: LIPITOR Take 1 tablet (40 mg total) by mouth at bedtime.   lisinopril 20 MG tablet Commonly known as: ZESTRIL Take 1 tablet (20 mg total) by mouth daily.   polyethylene glycol 17 g packet Commonly known as: MIRALAX / GLYCOLAX Take 17 g by mouth daily.     primidone 50 MG tablet Commonly known as: MYSOLINE TAKE 1 TABLET BY MOUTH THREE TIMES DAILY AS DIRECTED   propranolol 60 MG tablet Commonly known as: INDERAL Take 30 mg by mouth 2 (two) times daily.   sennosides-docusate sodium 8.6-50 MG tablet Commonly known as: SENOKOT-S Take 2 tablets by mouth daily.   warfarin 5 MG tablet Commonly known as: COUMADIN Take as directed by the anticoagulation clinic. If you are unsure how to take this medication, talk to your nurse or doctor. Original instructions: Take 1-1.5 tablets (5-7.5 mg total) by mouth daily. As directed        Objective:   BP (!) 158/62   Pulse 73   Temp 97.8 F (36.6 C)   Ht 5\' 1"  (1.549 m)   Wt 155 lb 2 oz (70.4 kg)   SpO2 94%   BMI 29.31 kg/m   Wt Readings from Last 3 Encounters:  04/19/19 155 lb (70.3 kg)  04/17/19 155 lb 2 oz (70.4 kg)  03/14/19 155 lb 6.4 oz (70.5 kg)    Physical Exam Vitals and nursing note reviewed.  Constitutional:      General: She is not in acute distress.    Appearance: She is well-developed. She is not diaphoretic.  Eyes:     Conjunctiva/sclera: Conjunctivae normal.  Cardiovascular:  Rate and Rhythm: Normal rate. Rhythm irregular.     Heart sounds: Normal heart sounds. No murmur.  Pulmonary:     Effort: Pulmonary effort is normal. No respiratory distress.     Breath sounds: Normal breath sounds. No wheezing.  Musculoskeletal:        General: No tenderness. Normal range of motion.  Skin:    General: Skin is warm and dry.     Findings: No rash.  Neurological:     Mental Status: She is alert and oriented to person, place, and time.     Coordination: Coordination normal.  Psychiatric:        Behavior: Behavior normal.       Assessment & Plan:   Problem List Items Addressed This Visit      Cardiovascular and Mediastinum   Atrial fibrillation ([place]) - Primary   Cerebrovascular accident (CVA) due to occlusion of left middle cerebral artery ([place])     Other    Encounter for monitoring Coumadin therapy   Relevant Orders   CoaguChek XS/INR Waived (Completed)      Description   Result 1.7, Goal 2.0-3.0 Take 1-1/2 tablets today and then resume taking 1 and 1/2 tab on Friday, 1 all other days Recheck 4 weeks     Follow up plan: Return in about 4 weeks (around 05/15/2019), or if symptoms worsen or fail to improve, for INR recheck, establish with new PCP.  Counseling provided for all of the vaccine components Orders Placed This Encounter  Procedures  . CoaguChek XS/INR [place], MD [facility] 04/23/2019, 10:33 PM

## 2019-04-19 ENCOUNTER — Encounter: Payer: Self-pay | Admitting: Orthopaedic Surgery

## 2019-04-19 ENCOUNTER — Other Ambulatory Visit: Payer: Self-pay

## 2019-04-19 ENCOUNTER — Ambulatory Visit (INDEPENDENT_AMBULATORY_CARE_PROVIDER_SITE_OTHER): Payer: Medicare Other | Admitting: Orthopaedic Surgery

## 2019-04-19 VITALS — Ht 61.0 in | Wt 155.0 lb

## 2019-04-19 DIAGNOSIS — M170 Bilateral primary osteoarthritis of knee: Secondary | ICD-10-CM | POA: Diagnosis not present

## 2019-04-19 DIAGNOSIS — M1711 Unilateral primary osteoarthritis, right knee: Secondary | ICD-10-CM

## 2019-04-19 MED ORDER — LIDOCAINE HCL 1 % IJ SOLN
2.0000 mL | INTRAMUSCULAR | Status: AC | PRN
  Administered 2019-04-19: 2 mL

## 2019-04-19 MED ORDER — METHYLPREDNISOLONE ACETATE 40 MG/ML IJ SUSP
80.0000 mg | INTRAMUSCULAR | Status: AC | PRN
  Administered 2019-04-19: 15:00:00 80 mg via INTRA_ARTICULAR

## 2019-04-19 MED ORDER — BUPIVACAINE HCL 0.5 % IJ SOLN
2.0000 mL | INTRAMUSCULAR | Status: AC | PRN
  Administered 2019-04-19: 15:00:00 2 mL via INTRA_ARTICULAR

## 2019-04-19 NOTE — Progress Notes (Signed)
Office Visit Note   Patient: Dominique Farley           Date of Birth: [birth year]           MRN: [id number] Visit Date: 04/19/2019              Requested by: Loman Brooklyn, [place],  [place] [id number] PCP: Loman Brooklyn, FNP   Assessment & Plan: Visit Diagnoses:  1. Primary osteoarthritis of both knees     Plan: Dominique Farley was approved for viscosupplementation right knee last fall but had a stroke.  She still having some residual in her right side but having more trouble with her right knee.  Today I am going to inject her right knee with cortisone wait 2 weeks and start the Synvisc injections.  She has had cortisone in the past without problems.  She is not diabetic  Follow-Up Instructions: Return in about 2 weeks (around 05/03/2019).   Orders:  Orders Placed This Encounter  Procedures  . Large Joint Inj: R knee   No orders of the defined types were placed in this encounter.     Procedures: Large Joint Inj: R knee on 04/19/2019 2:51 PM Indications: pain and diagnostic evaluation Details: 25 G 1.5 in needle, anteromedial approach  Arthrogram: No  Medications: 2 mL lidocaine 1 %; 2 mL bupivacaine 0.5 %; 80 mg methylPREDNISolone acetate 40 MG/ML Procedure, treatment alternatives, risks and benefits explained, specific risks discussed. Consent was given by the patient. Immediately prior to procedure a time out was called to verify the correct patient, procedure, equipment, support staff and site/side marked as required. Patient was prepped and draped in the usual sterile fashion.       Clinical Data: No additional findings.   Subjective: Chief Complaint  Patient presents with  . Right Knee - Pain  Patient presents today for right knee pain. She was scheduled to start Synvisc last October, but was unable to start due to having a stroke. Her knee pain is all over. She has a difficult time walking. She said that it will swell. She takes Tylenol as needed.   Prior x-rays in September 2020 demonstrated advanced osteoarthritis of both knees  HPI  Review of Systems   Objective: Vital Signs: Ht 5\' 1"  (1.549 m)   Wt 155 lb (70.3 kg)   BMI 29.29 kg/m   Physical Exam Constitutional:      Appearance: She is well-developed.  Pulmonary:     Effort: Pulmonary effort is normal.  Skin:    General: Skin is warm and dry.  Neurological:     Mental Status: She is alert and oriented to person, place, and time.  Psychiatric:        Behavior: Behavior normal.     Ortho Exam awake alert and oriented x3.  Comfortable sitting.  Small effusion in both knees.  There is some hypertrophic change about the medial aspect of both right and left knee.  No instability.  Full extension about 95 degrees of flexion right knee. Specialty Comments:  No specialty comments available.  Imaging: No results found.   PMFS History: Patient Active Problem List   Diagnosis Date Noted  . Atrial fibrillation ([place]) 11/04/2018  . Encounter for monitoring Coumadin therapy 11/04/2018  . Hypertensive emergency 10/21/2018  . Cerebrovascular accident (CVA) due to occlusion of left middle cerebral artery ([place]) 10/18/2018  . Osteoarthritis of knees, bilateral 10/05/2018  . Tremor 08/24/2018  . Essential hypertension 08/24/2018  .  Age related osteoporosis 08/24/2018   Past Medical History:  Diagnosis Date  . Atrial fibrillation ([place])   . BCC (basal cell carcinoma of skin) 11/14/2004   Right Mid Cheek(Cx3,5FU)  . BCC (basal cell carcinoma of skin) 04/16/2011   Right Crease(tx p bx)  . BCC (basal cell carcinoma of skin) 06/16/2016   Left Nare(tx p bx)  . BCC (basal cell carcinoma of skin) Atypical Basaloid 09/29/2018   Left Side Nose(tx p bx)  . Hypertension   . Osteoporosis   . SCC (squamous cell carcinoma) 09/29/2018   Left Jawline(tx p bx)  . 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   Right Jawline(Cx3,5FU) and Left Upper Cheek(Cx3,5FU)  . SCC (squamous cell  carcinoma) x 4 06/16/2016   Left Jawline(tx p bx), Nose(tx p bx), Right Cheek(tx p bx), and Right Forearm(tx p bx)  . 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   Right Temple(Cx3,5FU) and Left Outer Eye(Cx3,5FU)  . 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   Right Forearm(Cx3,5FU)  . Squamous cell carcinoma in situ (SCCIS) 03/13/2005   Left Side of Nose(Cx3,5FU)  . Squamous cell carcinoma in situ (SCCIS) 06/04/2005   Inner Right Cheek/Eye(Tx p Bx)  . Squamous cell carcinoma in situ (SCCIS) 02/12/2010   Right Cheek(tx p bx)  . 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 Stroke ([place])   . Tremor     Family History  Problem Relation Age of Onset  . Dementia Mother   . Stroke Father   . Heart disease Father   . Diabetes Sister   . Stroke Son     Past Surgical History:  Procedure Laterality Date  . CHOLECYSTECTOMY     Social History   Occupational History  . Not on file  Tobacco Use  . Smoking status: Never Smoker  . Smokeless tobacco: Never Used  Substance and Sexual Activity  . Alcohol use: Never  . Drug use: Never  . Sexual activity: Not on file

## 2019-04-24 ENCOUNTER — Ambulatory Visit (INDEPENDENT_AMBULATORY_CARE_PROVIDER_SITE_OTHER): Payer: Medicare Other | Admitting: *Deleted

## 2019-04-24 DIAGNOSIS — Z0000 Encounter for general adult medical examination without abnormal findings: Secondary | ICD-10-CM | POA: Diagnosis not present

## 2019-04-24 NOTE — Patient Instructions (Signed)
[place] Maintenance Summary and Written Plan of Care  Dominique Farley ,  Thank you for allowing me to perform your Medicare Annual Wellness Visit and for your ongoing commitment to your health.   Health Maintenance & Immunization History Health Maintenance  Topic Date Due  . DEXA SCAN  Never done  . PNA vac Low Risk Adult (1 of 2 - PCV13) Never done  . TETANUS/TDAP  11/25/2019 (Originally 11/30/1945)  . INFLUENZA VACCINE  08/13/2019    There is no immunization history on file for this patient.  These are the patient goals that we discussed: Goals Addressed            This Visit's Progress   . Prevent falls       04/24/2019 AWV Goal: Fall Prevention  . Over the next year, patient will decrease their risk for falls by: o Using assistive devices, such as a cane or walker, as needed o Identifying fall risks within their home and correcting them by: - Removing throw rugs - Adding handrails to stairs or ramps - Removing clutter and keeping a clear pathway throughout the home - Increasing light, especially at night - Adding shower handles/bars - Raising toilet seat o Identifying potential personal risk factors for falls: - Medication side effects - Incontinence/urgency - Vestibular dysfunction - Hearing loss - Musculoskeletal disorders - Neurological disorders - Orthostatic hypotension          This is a list of Health Maintenance Items that are overdue or due now: Health Maintenance Due  Topic Date Due  . DEXA SCAN  Never done  . PNA vac Low Risk Adult (1 of 2 - PCV13) Never done     Orders/Referrals Placed Today: No orders of the defined types were placed in this encounter.  (Contact our referral department at [phone] if you have not spoken with someone about your referral appointment within the next 5 days)    Advance Directive  Advance directives are legal documents that let you make choices ahead of time about your health  care and medical treatment in case you become unable to communicate for yourself. Advance directives are a way for you to make known your wishes to family, friends, and health care providers. This can let others know about your end-of-life care if you become unable to communicate. Discussing and writing advance directives should happen over time rather than all at once. Advance directives can be changed depending on your situation and what you want, even after you have signed the advance directives. There are different types of advance directives, such as:  Medical power of attorney.  Living will.  Do not resuscitate (DNR) or do not attempt resuscitation (DNAR) order. Health care proxy and medical power of attorney A health care proxy is also called a health care agent. This is a person who is appointed to make medical decisions for you in cases where you are unable to make the decisions yourself. Generally, people choose someone they know well and trust to represent their preferences. Make sure to ask this person for an agreement to act as your proxy. A proxy may have to exercise judgment in the event of a medical decision for which your wishes are not known. A medical power of attorney is a legal document that names your health care proxy. Depending on the laws in your state, after the document is written, it may also need to be:  Signed.  Notarized.  Dated.  Copied.  Witnessed.  Incorporated into your medical record. You may also want to appoint someone to manage your money in a situation in which you are unable to do so. This is called a durable power of attorney for finances. It is a separate legal document from the durable power of attorney for health care. You may choose the same person or someone different from your health care proxy to act as your agent in money matters. If you do not appoint a proxy, or if there is a concern that the proxy is not acting in your best interests, a  court may appoint a guardian to act on your behalf. Living will A living will is a set of instructions that state your wishes about medical care when you cannot express them yourself. Health care providers should keep a copy of your living will in your medical record. You may want to give a copy to family members or friends. To alert caregivers in case of an emergency, you can place a card in your wallet to let them know that you have a living will and where they can find it. A living will is used if you become:  Terminally ill.  Disabled.  Unable to communicate or make decisions. Items to consider in your living will include:  To use or not to use life-support equipment, such as dialysis machines and breathing machines (ventilators).  A DNR or DNAR order. This tells health care providers not to use cardiopulmonary resuscitation (CPR) if breathing or heartbeat stops.  To use or not to use tube feeding.  To be given or not to be given food and fluids.  Comfort (palliative) care when the goal becomes comfort rather than a cure.  Donation of organs and tissues. A living will does not give instructions for distributing your money and property if you should pass away. DNR or DNAR A DNR or DNAR order is a request not to have CPR in the event that your heart stops beating or you stop breathing. If a DNR or DNAR order has not been made and shared, a health care provider will try to help any patient whose heart has stopped or who has stopped breathing. If you plan to have surgery, talk with your health care provider about how your DNR or DNAR order will be followed if problems occur. What if I do not have an advance directive? If you do not have an advance directive, some states assign family decision makers to act on your behalf based on how closely you are related to them. Each state has its own laws about advance directives. You may want to check with your health care provider, attorney, or state  representative about the laws in your state. Summary  Advance directives are the legal documents that allow you to make choices ahead of time about your health care and medical treatment in case you become unable to tell others about your care.  The process of discussing and writing advance directives should happen over time. You can change the advance directives, even after you have signed them.  Advance directives include DNR or DNAR orders, living wills, and designating an agent as your medical power of attorney. This information is not intended to replace advice given to you by your health care provider. Make sure you discuss any questions you have with your health care provider. Document Revised: 07/28/2018 Document Reviewed: 07/28/2018 Elsevier Patient Education  [place] Prevention in the Home, Adult Falls can cause  injuries. They can happen to people of all ages. There are many things you can do to make your home safe and to help prevent falls. Ask for help when making these changes, if needed. What actions can I take to prevent falls? General Instructions  Use good lighting in all rooms. Replace any light bulbs that burn out.  Turn on the lights when you go into a dark area. Use night-lights.  Keep items that you use often in easy-to-reach places. Lower the shelves around your home if necessary.  Set up your furniture so you have a clear path. Avoid moving your furniture around.  Do not have throw rugs and other things on the floor that can make you trip.  Avoid walking on wet floors.  If any of your floors are uneven, fix them.  Add color or contrast paint or tape to clearly mark and help you see: ? Any grab bars or handrails. ? First and last steps of stairways. ? Where the edge of each step is.  If you use a stepladder: ? Make sure that it is fully opened. Do not climb a closed stepladder. ? Make sure that both sides of the stepladder are locked  into place. ? Ask someone to hold the stepladder for you while you use it.  If there are any pets around you, be aware of where they are. What can I do in the bathroom?      Keep the floor dry. Clean up any water that spills onto the floor as soon as it happens.  Remove soap buildup in the tub or shower regularly.  Use non-skid mats or decals on the floor of the tub or shower.  Attach bath mats securely with double-sided, non-slip rug tape.  If you need to sit down in the shower, use a plastic, non-slip stool.  Install grab bars by the toilet and in the tub and shower. Do not use towel bars as grab bars. What can I do in the bedroom?  Make sure that you have a light by your bed that is easy to reach.  Do not use any sheets or blankets that are too big for your bed. They should not hang down onto the floor.  Have a firm chair that has side arms. You can use this for support while you get dressed. What can I do in the kitchen?  Clean up any spills right away.  If you need to reach something above you, use a strong step stool that has a grab bar.  Keep electrical cords out of the way.  Do not use floor polish or wax that makes floors slippery. If you must use wax, use non-skid floor wax. What can I do with my stairs?  Do not leave any items on the stairs.  Make sure that you have a light switch at the top of the stairs and the bottom of the stairs. If you do not have them, ask someone to add them for you.  Make sure that there are handrails on both sides of the stairs, and use them. Fix handrails that are broken or loose. Make sure that handrails are as long as the stairways.  Install non-slip stair treads on all stairs in your home.  Avoid having throw rugs at the top or bottom of the stairs. If you do have throw rugs, attach them to the floor with carpet tape.  Choose a carpet that does not hide the edge of the steps on the  stairway.  Check any carpeting to make sure  that it is firmly attached to the stairs. Fix any carpet that is loose or worn. What can I do on the outside of my home?  Use bright outdoor lighting.  Regularly fix the edges of walkways and driveways and fix any cracks.  Remove anything that might make you trip as you walk through a door, such as a raised step or threshold.  Trim any bushes or trees on the path to your home.  Regularly check to see if handrails are loose or broken. Make sure that both sides of any steps have handrails.  Install guardrails along the edges of any raised decks and porches.  Clear walking paths of anything that might make someone trip, such as tools or rocks.  Have any leaves, snow, or ice cleared regularly.  Use sand or salt on walking paths during winter.  Clean up any spills in your garage right away. This includes grease or oil spills. What other actions can I take?  Wear shoes that: ? Have a low heel. Do not wear high heels. ? Have rubber bottoms. ? Are comfortable and fit you well. ? Are closed at the toe. Do not wear open-toe sandals.  Use tools that help you move around (mobility aids) if they are needed. These include: ? Canes. ? Walkers. ? Scooters. ? Crutches.  Review your medicines with your doctor. Some medicines can make you feel dizzy. This can increase your chance of falling. Ask your doctor what other things you can do to help prevent falls. Where to find more information  Centers for Disease Control and Prevention, STEADI: https://garcia.biz/  [employer] on Aging: BrainJudge.co.uk Contact a doctor if:  You are afraid of falling at home.  You feel weak, drowsy, or dizzy at home.  You fall at home. Summary  There are many simple things that you can do to make your home safe and to help prevent falls.  Ways to make your home safe include removing tripping hazards and installing grab bars in the bathroom.  Ask for help when making these changes in  your home. This information is not intended to replace advice given to you by your health care provider. Make sure you discuss any questions you have with your health care provider. Document Revised: 04/21/2018 Document Reviewed: 08/13/2016 Elsevier Patient Education  2020 [employer].

## 2019-04-24 NOTE — Progress Notes (Signed)
MEDICARE ANNUAL WELLNESS VISIT  04/24/2019  Telephone Visit Disclaimer This Medicare AWV was conducted by telephone due to national recommendations for restrictions regarding the COVID-19 Pandemic (e.g. social distancing).  I verified, using two identifiers, that I am speaking with Dominique Farley or their authorized healthcare agent. I discussed the limitations, risks, security, and privacy concerns of performing an evaluation and management service by telephone and the potential availability of an in-person appointment in the future. The patient expressed understanding and agreed to proceed.   Subjective:  Dominique Farley is a 86 y.o. female patient of Loman Brooklyn, FNP who had a Medicare Annual Wellness Visit today via telephone. Rex is Retired and lives alone. she has 3 children. she reports that she is socially active and does interact with friends/family regularly. she is minimally physically active and enjoys gardening.  Patient Care Team: Loman Brooklyn, FNP as PCP - General (Family Medicine) Herminio Commons, MD as PCP - Cardiology (Cardiology)  Advanced Directives 04/24/2019  Does Patient Have a Medical Advance Directive? No  Would patient like information on creating a medical advance directive? Yes (MAU/Ambulatory/Procedural Areas - Information given)    Hospital Utilization Over the Past 12 Months: # of hospitalizations or ER visits: 1 # of surgeries: 1  Review of Systems    Patient reports that her overall health is worse compared to last year.  History obtained from chart review and the patient General ROS: negative Neurological ROS: positive for - memory loss and tremors  Patient Reported Readings (BP, Pulse, CBG, Weight, etc) none  Pain Assessment Pain : No/denies pain     Current Medications & Allergies (verified) Allergies as of 04/24/2019      Reactions   Codeine Other (See Comments)   Influenza Vaccines    Sulfa Antibiotics Hives       Medication List       Accurate as of April 24, 2019  3:04 PM. If you have any questions, ask your nurse or doctor.        amLODipine 5 MG tablet Commonly known as: NORVASC Take 1 tablet (5 mg total) by mouth daily.   aspirin EC 81 MG tablet Take 81 mg by mouth daily. Every other day   atorvastatin 40 MG tablet Commonly known as: LIPITOR Take 1 tablet (40 mg total) by mouth at bedtime.   lisinopril 20 MG tablet Commonly known as: ZESTRIL Take 1 tablet (20 mg total) by mouth daily.   polyethylene glycol 17 g packet Commonly known as: MIRALAX / GLYCOLAX Take 17 g by mouth daily.   primidone 50 MG tablet Commonly known as: MYSOLINE TAKE 1 TABLET BY MOUTH THREE TIMES DAILY AS DIRECTED   propranolol 60 MG tablet Commonly known as: INDERAL Take 30 mg by mouth 2 (two) times daily.   sennosides-docusate sodium 8.6-50 MG tablet Commonly known as: SENOKOT-S Take 2 tablets by mouth daily.   warfarin 5 MG tablet Commonly known as: COUMADIN Take as directed by the anticoagulation clinic. If you are unsure how to take this medication, talk to your nurse or doctor. Original instructions: Take 1-1.5 tablets (5-7.5 mg total) by mouth daily. As directed       History (reviewed): Past Medical History:  Diagnosis Date  . Atrial fibrillation ([place])   . BCC (basal cell carcinoma of skin) 11/14/2004   Right Mid Cheek(Cx3,5FU)  . BCC (basal cell carcinoma of skin) 04/16/2011   Right Crease(tx p bx)  . BCC (basal cell carcinoma of  skin) 06/16/2016   Left Nare(tx p bx)  . BCC (basal cell carcinoma of skin) Atypical Basaloid 09/29/2018   Left Side Nose(tx p bx)  . Hypertension   . Osteoporosis   . SCC (squamous cell carcinoma) 09/29/2018   Left Jawline(tx p bx)  . 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   Right Jawline(Cx3,5FU) and Left Upper Cheek(Cx3,5FU)  . 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   Right [place](Cx3,5FU) and Left Outer Eye(Cx3,5FU)  . 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   Right Forearm(Cx3,5FU)  . Squamous cell carcinoma in situ (SCCIS) 03/13/2005   Left Side of Nose(Cx3,5FU)  . Squamous cell carcinoma in situ (SCCIS) 06/04/2005   Inner Right Cheek/Eye(Tx p Bx)  . Squamous cell carcinoma in situ (SCCIS) 02/12/2010   Right Cheek(tx p bx)  . 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 Stroke ([place])   . Tremor    Past Surgical History:  Procedure Laterality Date  . CHOLECYSTECTOMY     Family History  Problem Relation Age of Onset  . Dementia Mother   . Stroke Father   . Heart disease Father   . Diabetes Sister   . Stroke Son    Social History   Socioeconomic History  . Marital status: Widowed    Spouse name: Not on file  . Number of children: 3  . Years of education: Not on file  . Highest education level: Not on file  Occupational History  . Occupation: Retired  Tobacco Use  . Smoking status: Never Smoker  . Smokeless tobacco: Never Used  Substance and Sexual Activity  . Alcohol use: Never  . Drug use: Never  . Sexual activity: Not Currently  Other Topics Concern  . Not on file  Social History Narrative  . Not on file   Social Determinants of Health   Financial Resource Strain:   . Difficulty of Paying Living Expenses:   Food Insecurity:   . Worried About Charity fundraiser in the Last Year:   . Arboriculturist in the Last Year:   Transportation Needs:   . Film/video editor (Medical):   Marland Kitchen Lack of Transportation (Non-Medical):   Physical Activity:   . Days of Exercise per Week:   . Minutes of Exercise per Session:   Stress:   . Feeling of Stress :   Social  Connections:   . Frequency of Communication with Friends and Family:   . Frequency of Social Gatherings with Friends and Family:   . Attends Religious Services:   . Active Member of Clubs or Organizations:   . Attends Archivist Meetings:   Marland Kitchen Marital Status:     Activities of Daily Living In your present state of health, do you have any difficulty performing the following activities: 04/24/2019  Hearing? Y  Vision? Y  Difficulty concentrating or making decisions? Y  Walking or climbing stairs? Y  Dressing or bathing? Y  Doing errands, shopping? Y  Preparing Food and eating ? N  Using the Toilet? N  In the past six months, have you accidently  leaked urine? N  Do you have problems with loss of bowel control? N  Managing your Medications? Y  Managing your Finances? Y  Housekeeping or managing your Housekeeping? Y  Some recent data might be hidden    Patient Education/ Literacy How often do you need to have someone help you when you read instructions, pamphlets, or other written materials from your doctor or pharmacy?: 3 - Sometimes  Exercise Current Exercise Habits: The patient does not participate in regular exercise at present, Exercise limited by: neurologic condition(s)  Diet Patient reports consuming 3 meals a day and 0 snack(s) a day Patient reports that her primary diet is: Regular Patient reports that she does have regular access to food.   Depression Screen PHQ 2/9 Scores 04/24/2019 04/17/2019 03/14/2019 02/20/2019 01/23/2019 12/19/2018 11/25/2018  PHQ - 2 Score 0 0 0 0 0 1 0     Fall Risk Fall Risk  04/24/2019 04/17/2019 03/14/2019 02/20/2019 01/23/2019  Falls in the past year? 1 1 1 1 1   Number falls in past yr: 1 - 0 0 0  Injury with Fall? 0 - 0 0 0  Risk for fall due to : Impaired balance/gait;Impaired mobility;Mental status change - History of fall(s);Impaired mobility - -  Follow up Falls evaluation completed - - Falls prevention discussed Falls prevention  discussed     Objective:  Dominique Farley seemed alert and oriented and she participated appropriately during our telephone visit.  Blood Pressure Weight BMI  BP Readings from Last 3 Encounters:  04/17/19 (!) 158/62  03/14/19 (!) 157/75  02/20/19 (!) 158/66   Wt Readings from Last 3 Encounters:  04/19/19 155 lb (70.3 kg)  04/17/19 155 lb 2 oz (70.4 kg)  03/14/19 155 lb 6.4 oz (70.5 kg)   BMI Readings from Last 1 Encounters:  04/19/19 29.29 kg/m    *Unable to obtain current vital signs, weight, and BMI due to telephone visit type  Hearing/Vision  . Lakiya did  seem to have difficulty with hearing/understanding during the telephone conversation . Reports that she has not had a formal eye exam by an eye care professional within the past year . Reports that she has not had a formal hearing evaluation within the past year *Unable to fully assess hearing and vision during telephone visit type  Cognitive Function: 6CIT Screen 04/24/2019  What Year? 4 points  What month? 0 points  What time? 0 points  Count back from 20 4 points  Months in reverse 4 points  Repeat phrase 8 points  Total Score 20   (Normal:0-7, Significant for Dysfunction: >8)  Normal Cognitive Function Screening: No: Stroke 10/17/2018   Immunization & Health Maintenance Record  There is no immunization history on file for this patient.  Health Maintenance  Topic Date Due  . DEXA SCAN  Never done  . PNA vac Low Risk Adult (1 of 2 - PCV13) Never done  . TETANUS/TDAP  11/25/2019 (Originally 11/30/1945)  . INFLUENZA VACCINE  08/13/2019       Assessment  This is a routine wellness examination for Dominique Farley.  Health Maintenance: Due or Overdue Health Maintenance Due  Topic Date Due  . DEXA SCAN  Never done  . PNA vac Low Risk Adult (1 of 2 - PCV13) Never done    Dominique Farley does not need a referral for Community Assistance: Care Management:   no Social Work:    no Prescription Assistance:  no  Nutrition/Diabetes Education:  no   Plan:  Personalized  Goals Goals Addressed            This Visit's Progress   . Prevent falls       04/24/2019 AWV Goal: Fall Prevention  . Over the next year, patient will decrease their risk for falls by: o Using assistive devices, such as a cane or walker, as needed o Identifying fall risks within their home and correcting them by: - Removing throw rugs - Adding handrails to stairs or ramps - Removing clutter and keeping a clear pathway throughout the home - Increasing light, especially at night - Adding shower handles/bars - Raising toilet seat o Identifying potential personal risk factors for falls: - Medication side effects - Incontinence/urgency - Vestibular dysfunction - Hearing loss - Musculoskeletal disorders - Neurological disorders - Orthostatic hypotension        Personalized Health Maintenance & Screening Recommendations  Pneumococcal vaccine  Bone densitometry screening Advanced directives: has NO advanced directive  - add't info requested. Referral to SW: no  Lung Cancer Screening Recommended: no (Low Dose CT Chest recommended if Age 4-80 years, 30 pack-year currently smoking OR have quit w/in past 15 years) Hepatitis C Screening recommended: no HIV Screening recommended: no  Advanced Directives: Written information was prepared per patient's request.  Referrals & Orders No orders of the defined types were placed in this encounter.   Follow-up Plan . Follow-up with Loman Brooklyn, FNP as planned    I have personally reviewed and noted the following in the patient's chart:   . Medical and social history . Use of alcohol, tobacco or illicit drugs  . Current medications and supplements . Functional ability and status . Nutritional status . Physical activity . Advanced directives . List of other physicians . Hospitalizations, surgeries, and ER visits in previous 12 months . Vitals . Screenings to  include cognitive, depression, and falls . Referrals and appointments  In addition, I have reviewed and discussed with Dominique Farley certain preventive protocols, quality metrics, and best practice recommendations. A written personalized care plan for preventive services as well as general preventive health recommendations is available and can be mailed to the patient at her request.      Wardell Heath, LPN  [id number]

## 2019-05-01 ENCOUNTER — Ambulatory Visit: Payer: Medicare Other | Admitting: Neurology

## 2019-05-03 ENCOUNTER — Ambulatory Visit (INDEPENDENT_AMBULATORY_CARE_PROVIDER_SITE_OTHER): Payer: Medicare Other | Admitting: Orthopaedic Surgery

## 2019-05-03 ENCOUNTER — Encounter: Payer: Self-pay | Admitting: Orthopaedic Surgery

## 2019-05-03 ENCOUNTER — Other Ambulatory Visit: Payer: Self-pay

## 2019-05-03 DIAGNOSIS — M1711 Unilateral primary osteoarthritis, right knee: Secondary | ICD-10-CM

## 2019-05-03 MED ORDER — HYLAN G-F 20 16 MG/2ML IX SOSY
16.0000 mg | PREFILLED_SYRINGE | INTRA_ARTICULAR | Status: AC | PRN
  Administered 2019-05-03: 16 mg via INTRA_ARTICULAR

## 2019-05-03 NOTE — Progress Notes (Addendum)
Office Visit Note   Patient: Dominique Farley           Date of Birth: [birth year]           MRN: [id number] Visit Date: 05/03/2019              Requested by: Loman Brooklyn, [place],  [place] [id number] PCP: Loman Brooklyn, FNP   Assessment & Plan: Visit Diagnoses:  1. Unilateral primary osteoarthritis, right knee     Plan: First Synvisc injection right knee.  Follow-up weekly for the next 2 weeks to complete the series  Follow-Up Instructions: Return in about 1 week (around 05/10/2019).   Orders:  No orders of the defined types were placed in this encounter.  No orders of the defined types were placed in this encounter.     Procedures: Large Joint Inj: R knee on 05/03/2019 3:02 PM Indications: pain and joint swelling Details: 25 G 1.5 in needle  Arthrogram: No  Medications: 16 mg Hylan 16 MG/2ML Outcome: tolerated well, no immediate complications Procedure, treatment alternatives, risks and benefits explained, specific risks discussed. Consent was given by the patient. Immediately prior to procedure a time out was called to verify the correct patient, procedure, equipment, support staff and site/side marked as required. Patient was prepped and draped in the usual sterile fashion.       Clinical Data: No additional findings.   Subjective: No chief complaint on file. Dominique Farley has been approved for Synvisc injections to the right knee.  We will start those injections today.  Feeling better after the cortisone injection several weeks ago  HPI  Review of Systems   Objective: Vital Signs: There were no vitals taken for this visit.  Physical Exam  Ortho Exam awake alert and oriented x3.  Comfortable sitting.  Walks without ambulatory aid.  Right knee was not hot red warm or swollen.  Has some skin abrasions in the distal aspect of her leg but no evidence of cellulitis or infection  Specialty Comments:  No specialty comments  available.  Imaging: No results found.   PMFS History: Patient Active Problem List   Diagnosis Date Noted  . Atrial fibrillation (Dominique Farley) 11/04/2018  . Encounter for monitoring Coumadin therapy 11/04/2018  . Hypertensive emergency 10/21/2018  . Cerebrovascular accident (CVA) due to occlusion of left middle cerebral artery (Dominique Farley) 10/18/2018  . Unilateral primary osteoarthritis, right knee 10/05/2018  . Tremor 08/24/2018  . Essential hypertension 08/24/2018  . Age related osteoporosis 08/24/2018   Past Medical History:  Diagnosis Date  . Atrial fibrillation (Dominique Farley)   . BCC (basal cell carcinoma of skin) 11/14/2004   Right Mid Cheek(Cx3,5FU)  . BCC (basal cell carcinoma of skin) 04/16/2011   Right Crease(tx p bx)  . BCC (basal cell carcinoma of skin) 06/16/2016   Left Nare(tx p bx)  . BCC (basal cell carcinoma of skin) Atypical Basaloid 09/29/2018   Left Side Nose(tx p bx)  . Hypertension   . Osteoporosis   . SCC (squamous cell carcinoma) 09/29/2018   Left Jawline(tx p bx)  . 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   Right Jawline(Cx3,5FU) and Left Upper Cheek(Cx3,5FU)  . 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   Left Jawline(tx p bx), Nose(tx p bx), Right Cheek(tx p bx), and Right Forearm(tx p bx)  . 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   Right Temple(Cx3,5FU) and Left Outer Eye(Cx3,5FU)  . 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  Right Forearm(Cx3,5FU)  . Squamous cell carcinoma in situ (SCCIS) 03/13/2005   Left Side of Nose(Cx3,5FU)  . Squamous cell carcinoma in situ (SCCIS) 06/04/2005   Inner Right Cheek/Eye(Tx p Bx)  . Squamous cell carcinoma in situ (SCCIS) 02/12/2010   Right Cheek(tx p bx)  . 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 Stroke (Dominique Farley)   .  Tremor     Family History  Problem Relation Age of Onset  . Dementia Mother   . Stroke Father   . Heart disease Father   . Diabetes Sister   . Stroke Son     Past Surgical History:  Procedure Laterality Date  . CHOLECYSTECTOMY     Social History   Occupational History  . Occupation: Retired  Tobacco Use  . Smoking status: Never Smoker  . Smokeless tobacco: Never Used  Substance and Sexual Activity  . Alcohol use: Never  . Drug use: Never  . Sexual activity: Not Currently     Dominique Balding, MD   Note - This record has been created using [employer].  Chart creation errors have been sought, but may not always  have been located. Such creation errors do not reflect on  the standard of medical care.

## 2019-05-10 ENCOUNTER — Other Ambulatory Visit: Payer: Self-pay | Admitting: *Deleted

## 2019-05-10 DIAGNOSIS — I10 Essential (primary) hypertension: Secondary | ICD-10-CM

## 2019-05-10 DIAGNOSIS — I63512 Cerebral infarction due to unspecified occlusion or stenosis of left middle cerebral artery: Secondary | ICD-10-CM

## 2019-05-10 MED ORDER — LISINOPRIL 20 MG PO TABS: 20.0000 mg | ORAL_TABLET | Freq: Every day | ORAL | 0 refills | Status: DC

## 2019-05-10 MED ORDER — ATORVASTATIN CALCIUM 40 MG PO TABS: 40.0000 mg | ORAL_TABLET | Freq: Every day | ORAL | 0 refills | Status: DC

## 2019-05-10 MED ORDER — AMLODIPINE BESYLATE 5 MG PO TABS: 5.0000 mg | ORAL_TABLET | Freq: Every day | ORAL | 0 refills | Status: DC

## 2019-05-11 ENCOUNTER — Encounter: Payer: Self-pay | Admitting: Family Medicine

## 2019-05-11 ENCOUNTER — Other Ambulatory Visit: Payer: Self-pay

## 2019-05-11 ENCOUNTER — Ambulatory Visit (INDEPENDENT_AMBULATORY_CARE_PROVIDER_SITE_OTHER): Payer: Medicare Other | Admitting: Family Medicine

## 2019-05-11 ENCOUNTER — Encounter: Payer: Self-pay | Admitting: Orthopaedic Surgery

## 2019-05-11 ENCOUNTER — Ambulatory Visit (INDEPENDENT_AMBULATORY_CARE_PROVIDER_SITE_OTHER): Payer: Medicare Other | Admitting: Orthopaedic Surgery

## 2019-05-11 VITALS — Ht 61.0 in | Wt 155.0 lb

## 2019-05-11 VITALS — BP 167/66 | HR 58 | Temp 97.6°F | Ht 61.0 in | Wt 157.6 lb

## 2019-05-11 DIAGNOSIS — I4811 Longstanding persistent atrial fibrillation: Secondary | ICD-10-CM

## 2019-05-11 DIAGNOSIS — I6930 Unspecified sequelae of cerebral infarction: Secondary | ICD-10-CM | POA: Diagnosis not present

## 2019-05-11 DIAGNOSIS — I63512 Cerebral infarction due to unspecified occlusion or stenosis of left middle cerebral artery: Secondary | ICD-10-CM

## 2019-05-11 DIAGNOSIS — M1711 Unilateral primary osteoarthritis, right knee: Secondary | ICD-10-CM

## 2019-05-11 LAB — COAGUCHEK XS/INR WAIVED
INR: 1.8 — ABNORMAL HIGH (ref 0.9–1.1)
Prothrombin Time: 21.2 s

## 2019-05-11 MED ORDER — LIDOCAINE HCL 1 % IJ SOLN
0.5000 mL | INTRAMUSCULAR | Status: AC | PRN
  Administered 2019-05-11: 13:00:00 .5 mL

## 2019-05-11 MED ORDER — HYLAN G-F 20 16 MG/2ML IX SOSY
16.0000 mg | PREFILLED_SYRINGE | INTRA_ARTICULAR | Status: AC | PRN
  Administered 2019-05-11: 16 mg via INTRA_ARTICULAR

## 2019-05-11 NOTE — Progress Notes (Signed)
Assessment & Plan:  1-2. Longstanding persistent atrial fibrillation (HCC)/Cerebrovascular accident (CVA) due to occlusion of left middle cerebral artery [facility]) Description   Result 1.8, Goal 2.0-3.0 Increase to 1.5 tablets on Monday and Thursday, continue 1 tablet all other days Re-check in 4 weeks   - CoaguChek XS/INR Waived   Follow up plan: Return in about 4 weeks (around 06/08/2019) for INR.  Hendricks Limes, MSN, APRN, FNP-C [facility]  Subjective:   Patient ID: Dominique Farley, female    DOB: 10/28/1926, 86 y.o.   MRN: [id number]  HPI: Dominique Farley is a 86 y.o. female presenting on 05/11/2019 for Coagulation Disorder  Anticoagulation: Patient here for anticoagulation monitoring. Indication: atrial fibrillation Bleeding Signs/Symptoms:  None Thromboembolic Signs/Symptoms:  None  Missed Coumadin Doses:  None Medication Changes:  no Dietary Changes:  no Bacterial/Viral Infection:  no  Other Concerns:  no   ROS: Negative unless specifically indicated above in HPI.   Relevant past medical history reviewed and updated as indicated.   Allergies and medications reviewed and updated.   Current Outpatient Medications:  .  amLODipine (NORVASC) 5 MG tablet, Take 1 tablet (5 mg total) by mouth daily., Disp: 90 tablet, Rfl: 0 .  aspirin EC 81 MG tablet, Take 81 mg by mouth daily. Every other day, Disp: , Rfl:  .  atorvastatin (LIPITOR) 40 MG tablet, Take 1 tablet (40 mg total) by mouth at bedtime., Disp: 90 tablet, Rfl: 0 .  lisinopril (ZESTRIL) 20 MG tablet, Take 1 tablet (20 mg total) by mouth daily., Disp: 90 tablet, Rfl: 0 .  polyethylene glycol (MIRALAX / GLYCOLAX) 17 g packet, Take 17 g by mouth daily., Disp: , Rfl:  .  primidone (MYSOLINE) 50 MG tablet, TAKE 1 TABLET BY MOUTH THREE TIMES DAILY AS DIRECTED, Disp: , Rfl:  .  propranolol (INDERAL) 60 MG tablet, Take 30 mg by mouth 2 (two) times daily., Disp: , Rfl:  .  sennosides-docusate sodium  (SENOKOT-S) 8.6-50 MG tablet, Take 2 tablets by mouth daily., Disp: , Rfl:  .  warfarin (COUMADIN) 5 MG tablet, Take 1-1.5 tablets (5-7.5 mg total) by mouth daily. As directed, Disp: 120 tablet, Rfl: 1  Allergies  Allergen Reactions  . Codeine Other (See Comments)  . Influenza Vaccines   . Sulfa Antibiotics Hives    Objective:   BP (!) 167/66   Pulse (!) 58   Temp 97.6 F (36.4 C) (Temporal)   Ht 5\' 1"  (1.549 m)   Wt 157 lb 9.6 oz (71.5 kg)   SpO2 92%   BMI 29.78 kg/m    Physical Exam Vitals reviewed.  Constitutional:      General: She is not in acute distress.    Appearance: Normal appearance. She is not ill-appearing, toxic-appearing or diaphoretic.  HENT:     Head: Normocephalic and atraumatic.  Eyes:     General: No scleral icterus.       Right eye: No discharge.        Left eye: No discharge.     Conjunctiva/sclera: Conjunctivae normal.  Cardiovascular:     Rate and Rhythm: Normal rate. Rhythm irregular.     Heart sounds: Normal heart sounds. No murmur. No friction rub. No gallop.   Pulmonary:     Effort: Pulmonary effort is normal. No respiratory distress.     Breath sounds: Normal breath sounds. No stridor. No wheezing, rhonchi or rales.  Musculoskeletal:        General: Normal range of motion.  Cervical back: Normal range of motion.  Skin:    General: Skin is warm and dry.     Capillary Refill: Capillary refill takes less than 2 seconds.  Neurological:     General: No focal deficit present.     Mental Status: She is alert and oriented to person, place, and time. Mental status is at baseline.  Psychiatric:        Mood and Affect: Mood normal.        Behavior: Behavior normal.        Thought Content: Thought content normal.        Judgment: Judgment normal.

## 2019-05-11 NOTE — Progress Notes (Signed)
Office Visit Note   Patient: Dominique Farley           Date of Birth: 12-29-1926           MRN: [id number] Visit Date: 05/11/2019              Requested by: Loman Brooklyn, [place],  [place] [id number] PCP: Loman Brooklyn, FNP   Assessment & Plan: Visit Diagnoses:  1. Unilateral primary osteoarthritis, right knee     Plan: Right knee second Synvisc injection performed.  She will see Dr. Durward Fortes next week for her third Synvisc injection right knee and he can consider possibly putting cortisone in her left knee if she still having left knee pain.  Follow-Up Instructions: sheduled with [facility] next week for 3rd synovisc right knee  Orders:  Orders Placed This Encounter  Procedures  . Large Joint Inj   No orders of the defined types were placed in this encounter.     Procedures: Large Joint Inj: R knee on 05/11/2019 1:09 PM Indications: pain and joint swelling Details: 22 G 1.5 in needle, anterolateral approach  Arthrogram: No  Medications: 0.5 mL lidocaine 1 %; 16 mg Hylan 16 MG/2ML Outcome: tolerated well, no immediate complications Procedure, treatment alternatives, risks and benefits explained, specific risks discussed. Consent was given by the patient. Immediately prior to procedure a time out was called to verify the correct patient, procedure, equipment, support staff and site/side marked as required. Patient was prepped and draped in the usual sterile fashion.       Clinical Data: No additional findings.   Subjective: Chief Complaint  Patient presents with  . Right Knee - Follow-up, Pain    Synvisc Injection #2 Right Knee    HPI patient here for second right knee Synvisc injection.  Patient is noted increased problems with her left knee and has had previous injections by her PCP in her left knee with cortisone which worked well.  Review of Systems no change.   Objective: Vital Signs: Ht 5\' 1"  (1.549 m)   Wt 155 lb (70.3 kg)   BMI  29.29 kg/m   Physical Exam no change.  Ortho Exam mild edema both lower extremities with some superficial venous stasis changes no active ulceration.  Crepitus with knee range of motion.  Specialty Comments:  No specialty comments available.  Imaging: No results found.   PMFS History: Patient Active Problem List   Diagnosis Date Noted  . Atrial fibrillation ([place]) 11/04/2018  . Encounter for monitoring Coumadin therapy 11/04/2018  . Hypertensive emergency 10/21/2018  . Cerebrovascular accident (CVA) due to occlusion of left middle cerebral artery ([place]) 10/18/2018  . Unilateral primary osteoarthritis, right knee 10/05/2018  . Tremor 08/24/2018  . Essential hypertension 08/24/2018  . Age related osteoporosis 08/24/2018   Past Medical History:  Diagnosis Date  . Atrial fibrillation ([place])   . BCC (basal cell carcinoma of skin) 11/14/2004   Right Mid Cheek(Cx3,5FU)  . BCC (basal cell carcinoma of skin) 04/16/2011   Right Crease(tx p bx)  . BCC (basal cell carcinoma of skin) 06/16/2016   Left Nare(tx p bx)  . BCC (basal cell carcinoma of skin) Atypical Basaloid 09/29/2018   Left Side Nose(tx p bx)  . Hypertension   . Osteoporosis   . SCC (squamous cell carcinoma) 09/29/2018   Left Jawline(tx p bx)  . 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   Right Jawline(Cx3,5FU) and Left Upper Cheek(Cx3,5FU)  . SCC (squamous cell  carcinoma) x 4 06/16/2016   Left Jawline(tx p bx), Nose(tx p bx), Right Cheek(tx p bx), and Right Forearm(tx p bx)  . 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   Right Temple(Cx3,5FU) and Left Outer Eye(Cx3,5FU)  . 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   Right Forearm(Cx3,5FU)  . Squamous cell carcinoma in situ (SCCIS) 03/13/2005   Left Side of Nose(Cx3,5FU)  . Squamous cell carcinoma in situ (SCCIS) 06/04/2005   Inner Right Cheek/Eye(Tx p Bx)  . Squamous cell carcinoma in situ (SCCIS) 02/12/2010   Right Cheek(tx p bx)  .  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 Stroke ([place])   . Tremor     Family History  Problem Relation Age of Onset  . Dementia Mother   . Stroke Father   . Heart disease Father   . Diabetes Sister   . Stroke Son     Past Surgical History:  Procedure Laterality Date  . CHOLECYSTECTOMY     Social History   Occupational History  . Occupation: Retired  Tobacco Use  . Smoking status: Never Smoker  . Smokeless tobacco: Never Used  Substance and Sexual Activity  . Alcohol use: Never  . Drug use: Never  . Sexual activity: Not Currently

## 2019-05-15 ENCOUNTER — Ambulatory Visit: Payer: Medicare Other | Admitting: Neurology

## 2019-05-15 ENCOUNTER — Encounter: Payer: Self-pay | Admitting: Family Medicine

## 2019-05-17 ENCOUNTER — Ambulatory Visit (INDEPENDENT_AMBULATORY_CARE_PROVIDER_SITE_OTHER): Payer: Medicare Other | Admitting: Orthopaedic Surgery

## 2019-05-17 ENCOUNTER — Other Ambulatory Visit: Payer: Self-pay

## 2019-05-17 ENCOUNTER — Encounter: Payer: Self-pay | Admitting: Orthopaedic Surgery

## 2019-05-17 VITALS — Ht 61.0 in | Wt 157.0 lb

## 2019-05-17 DIAGNOSIS — M170 Bilateral primary osteoarthritis of knee: Secondary | ICD-10-CM

## 2019-05-17 MED ORDER — HYLAN G-F 20 16 MG/2ML IX SOSY
16.0000 mg | PREFILLED_SYRINGE | INTRA_ARTICULAR | Status: AC | PRN
Start: 2019-05-17 — End: 2019-05-17
  Administered 2019-05-17: 16 mg via INTRA_ARTICULAR

## 2019-05-17 NOTE — Progress Notes (Signed)
Office Visit Note   Patient: Dominique Farley           Date of Birth: [birth year]           MRN: [id number] Visit Date: 05/17/2019              Requested by: Dominique Farley, [address] [place],  [place] [address] PCP: Dominique Norlander, DO   Assessment & Plan: Visit Diagnoses:  1. Bilateral primary osteoarthritis of knee     Plan: Third Synvisc injection right knee.  Dominique Farley relates that she is feeling better  Follow-Up Instructions: Return if symptoms worsen or fail to improve.   Orders:  Orders Placed This Encounter  Procedures  . Large Joint Inj: R knee   No orders of the defined types were placed in this encounter.     Procedures: Large Joint Inj: R knee on 05/17/2019 3:39 PM Indications: pain and joint swelling Details: 25 G 1.5 in needle  Arthrogram: No  Medications: 16 mg Hylan 16 MG/2ML Outcome: tolerated well, no immediate complications Procedure, treatment alternatives, risks and benefits explained, specific risks discussed. Consent was given by the patient. Immediately prior to procedure a time out was called to verify the correct patient, procedure, equipment, support staff and site/side marked as required. Patient was prepped and draped in the usual sterile fashion.       Clinical Data: No additional findings.   Subjective: Chief Complaint  Patient presents with  . Right Knee - Follow-up    Synvisc started 05/03/2019  Patient presents today for the third Synvisc injection in her right knee. She started the series on 05/03/2019. She states that her knee is feeling better.  HPI  Review of Systems   Objective: Vital Signs: Ht 5\' 1"  (1.549 m)   Wt 157 lb (71.2 kg)   BMI 29.66 kg/m   Physical Exam  Ortho Exam right knee was not hot red warm or swollen  Specialty Comments:  No specialty comments available.  Imaging: No results found.   PMFS History: Patient Active Problem List   Diagnosis Date Noted  . Atrial fibrillation  (Dominique Farley) 11/04/2018  . Encounter for monitoring Coumadin therapy 11/04/2018  . Cerebrovascular accident (CVA) due to occlusion of left middle cerebral artery (Dominique Farley) 10/18/2018  . Unilateral primary osteoarthritis, right knee 10/05/2018  . Tremor 08/24/2018  . Essential hypertension 08/24/2018  . Age related osteoporosis 08/24/2018   Past Medical History:  Diagnosis Date  . Atrial fibrillation (Dominique Farley)   . BCC (basal cell carcinoma of skin) 11/14/2004   Right Mid Cheek(Cx3,5FU)  . BCC (basal cell carcinoma of skin) 04/16/2011   Right Crease(tx p bx)  . BCC (basal cell carcinoma of skin) 06/16/2016   Left Nare(tx p bx)  . BCC (basal cell carcinoma of skin) Atypical Basaloid 09/29/2018   Left Side Nose(tx p bx)  . Hypertension   . Osteoporosis   . SCC (squamous cell carcinoma) 09/29/2018   Left Jawline(tx p bx)  . 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   Right Jawline(Cx3,5FU) and Left Upper Cheek(Cx3,5FU)  . 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   Left Jawline(tx p bx), Nose(tx p bx), Right Cheek(tx p bx), and Right Forearm(tx p bx)  . 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   Right Temple(Cx3,5FU) and Left Outer Eye(Cx3,5FU)  . 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   Right Forearm(Cx3,5FU)  . Squamous cell carcinoma in situ (SCCIS) 03/13/2005   Left  Side of Nose(Cx3,5FU)  . Squamous cell carcinoma in situ (SCCIS) 06/04/2005   Inner Right Cheek/Eye(Tx p Bx)  . Squamous cell carcinoma in situ (SCCIS) 02/12/2010   Right Cheek(tx p bx)  . 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 Stroke (Dominique Farley)   . Tremor     Family History  Problem Relation Age of Onset  . Dementia Mother   . Stroke Father   . Heart disease Father   . Diabetes Sister   . Stroke Son     Past  Surgical History:  Procedure Laterality Date  . CHOLECYSTECTOMY     Social History   Occupational History  . Occupation: Retired  Tobacco Use  . Smoking status: Never Smoker  . Smokeless tobacco: Never Used  Substance and Sexual Activity  . Alcohol use: Never  . Drug use: Never  . Sexual activity: Not Currently

## 2019-06-02 ENCOUNTER — Encounter: Payer: Self-pay | Admitting: Cardiovascular Disease

## 2019-06-02 ENCOUNTER — Other Ambulatory Visit: Payer: Self-pay

## 2019-06-02 ENCOUNTER — Ambulatory Visit (INDEPENDENT_AMBULATORY_CARE_PROVIDER_SITE_OTHER): Payer: Medicare Other | Admitting: Cardiovascular Disease

## 2019-06-02 VITALS — BP 176/74 | HR 64 | Ht 61.0 in | Wt 152.4 lb

## 2019-06-02 DIAGNOSIS — I4891 Unspecified atrial fibrillation: Secondary | ICD-10-CM | POA: Diagnosis not present

## 2019-06-02 DIAGNOSIS — I63512 Cerebral infarction due to unspecified occlusion or stenosis of left middle cerebral artery: Secondary | ICD-10-CM | POA: Diagnosis not present

## 2019-06-02 DIAGNOSIS — I10 Essential (primary) hypertension: Secondary | ICD-10-CM | POA: Diagnosis not present

## 2019-06-02 NOTE — Progress Notes (Signed)
SUBJECTIVE: The patient presents for follow-up of longstanding persistent atrial fibrillation.  Initially evaluated her on 12/06/2018.  She also has a history of CVA due to occlusion of the left middle cerebral artery.  She is anticoagulated with warfarin.  She takes aspirin every other day and warfarin every evening.  Anticoagulation is managed by her PCP.  I personally reviewed the ECG performed today which demonstrates rate controlled atrial fibrillation, 55 bpm, with LVH and consequent repolarization abnormalities.  The patient denies any symptoms of chest pain, palpitations, shortness of breath, lightheadedness, dizziness, leg swelling, orthopnea, PND, and syncope.  Her daughter is with her today.  She says her mother has whitecoat hypertension.  Systolic blood pressures at home generally are in the 120 range.    Review of Systems: As per "subjective", otherwise negative.  Allergies  Allergen Reactions  . Codeine Other (See Comments)  . Influenza Vaccines   . Sulfa Antibiotics Hives    Current Outpatient Medications  Medication Sig Dispense Refill  . amLODipine (NORVASC) 5 MG tablet Take 1 tablet (5 mg total) by mouth daily. 90 tablet 0  . aspirin EC 81 MG tablet Take 81 mg by mouth daily. Every other day    . atorvastatin (LIPITOR) 40 MG tablet Take 1 tablet (40 mg total) by mouth at bedtime. 90 tablet 0  . lisinopril (ZESTRIL) 20 MG tablet Take 1 tablet (20 mg total) by mouth daily. 90 tablet 0  . polyethylene glycol (MIRALAX / GLYCOLAX) 17 g packet Take 17 g by mouth daily.    . primidone (MYSOLINE) 50 MG tablet TAKE 1 TABLET BY MOUTH THREE TIMES DAILY AS DIRECTED    . propranolol (INDERAL) 60 MG tablet Take 30 mg by mouth 2 (two) times daily.    . sennosides-docusate sodium (SENOKOT-S) 8.6-50 MG tablet Take 2 tablets by mouth daily.    Marland Kitchen warfarin (COUMADIN) 5 MG tablet Take 1-1.5 tablets (5-7.5 mg total) by mouth daily. As directed 120 tablet 1   No current  facility-administered medications for this visit.    Past Medical History:  Diagnosis Date  . Atrial fibrillation ([place])   . BCC (basal cell carcinoma of skin) 11/14/2004   Right Mid Cheek(Cx3,5FU)  . BCC (basal cell carcinoma of skin) 04/16/2011   Right Crease(tx p bx)  . BCC (basal cell carcinoma of skin) 06/16/2016   Left Nare(tx p bx)  . BCC (basal cell carcinoma of skin) Atypical Basaloid 09/29/2018   Left Side Nose(tx p bx)  . Hypertension   . Osteoporosis   . SCC (squamous cell carcinoma) 09/29/2018   Left Jawline(tx p bx)  . 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   Right Jawline(Cx3,5FU) and Left Upper Cheek(Cx3,5FU)  . 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   Left Jawline(tx p bx), Nose(tx p bx), Right Cheek(tx p bx), and Right Forearm(tx p bx)  . 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   Right Temple(Cx3,5FU) and Left Outer Eye(Cx3,5FU)  . 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   Right Forearm(Cx3,5FU)  . Squamous cell carcinoma in situ (SCCIS) 03/13/2005   Left Side of Nose(Cx3,5FU)  . Squamous cell carcinoma in situ (SCCIS) 06/04/2005   Inner Right Cheek/Eye(Tx p Bx)  . Squamous cell carcinoma in situ (SCCIS) 02/12/2010   Right Cheek(tx p bx)  . 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 Squamous cell carcinoma  in situ (SCCIS) x 4 08/29/2007   Left Forehead, Left Neck, Right Nose, and Right Hand  . Stroke ([place])   . Tremor     Past Surgical History:  Procedure Laterality Date  . CHOLECYSTECTOMY      Social History   Socioeconomic History  . Marital status: Widowed    Spouse name: Not on file  . Number of children: 3  . Years of education: Not on file  . Highest education level: Not on file  Occupational History  . Occupation: Retired  Tobacco Use  . Smoking status: Never Smoker  . Smokeless tobacco: Never Used  Substance and Sexual Activity  .  Alcohol use: Never  . Drug use: Never  . Sexual activity: Not Currently  Other Topics Concern  . Not on file  Social History Narrative  . Not on file   Social Determinants of Health   Financial Resource Strain:   . Difficulty of Paying Living Expenses:   Food Insecurity:   . Worried About Charity fundraiser in the Last Year:   . Arboriculturist in the Last Year:   Transportation Needs:   . Film/video editor (Medical):   Marland Kitchen Lack of Transportation (Non-Medical):   Physical Activity:   . Days of Exercise per Week:   . Minutes of Exercise per Session:   Stress:   . Feeling of Stress :   Social Connections:   . Frequency of Communication with Friends and Family:   . Frequency of Social Gatherings with Friends and Family:   . Attends Religious Services:   . Active Member of Clubs or Organizations:   . Attends Archivist Meetings:   Marland Kitchen Marital Status:   Intimate Partner Violence:   . Fear of Current or Ex-Partner:   . Emotionally Abused:   Marland Kitchen Physically Abused:   . Sexually Abused:     Orson Slick, LPN was present throughout the entirety of the encounter.  Vitals:   06/02/19 1252  BP: (!) 176/74  Pulse: 64  SpO2: 95%  Weight: 152 lb 6.4 oz (69.1 kg)  Height: 5\' 1"  (1.549 m)    Wt Readings from Last 3 Encounters:  06/02/19 152 lb 6.4 oz (69.1 kg)  05/17/19 157 lb (71.2 kg)  05/11/19 157 lb 9.6 oz (71.5 kg)     PHYSICAL EXAM General: NAD HEENT: Normal. Neck: No JVD, no thyromegaly. Lungs: Clear to auscultation bilaterally with normal respiratory effort. CV: Regular rate and irregular rhythm, normal S1/S2, no S3, no murmur. No pretibial or periankle edema.   Abdomen: Soft, nontender, no distention.  Neurologic: Alert and oriented.  Psych: Normal affect. Skin: Normal. Musculoskeletal: No gross deformities.      Labs: No results found for: K, BUN, CREATININE, ALT, TSH, HGB   Lipids: No results found for: LDLCALC, LDLDIRECT, CHOL, TRIG, HDL       ASSESSMENT AND PLAN:  1.  Longstanding persistent atrial fibrillation: This was diagnosed in October 2020 at the time of CVA.  Anticoagulated with warfarin.  Heart rate is controlled with propranolol.  She is asymptomatic.  Anticoagulation is managed by her PCP.  2.  History of CVA: She currently takes aspirin every other day along with atorvastatin.  3.  Hypertension: BP is elevated.  Currently on amlodipine, lisinopril, and propranolol.  Her daughter who is with her today says her mother has whitecoat hypertension.  They check her blood pressure at home and systolic readings are generally in the 120 range.  No changes to therapy.   Disposition: Follow up 6 months with ECG   Kate Sable, M.D., F.A.C.C.

## 2019-06-02 NOTE — Patient Instructions (Signed)
Medication Instructions:  Continue all current medications.   Labwork: none  Testing/Procedures: none  Follow-Up: 6 months   Any Other Special Instructions Will Be Listed Below (If Applicable).   If you need a refill on your cardiac medications before your next appointment, please call your pharmacy.  

## 2019-06-06 ENCOUNTER — Other Ambulatory Visit: Payer: Self-pay | Admitting: *Deleted

## 2019-06-06 DIAGNOSIS — I4811 Longstanding persistent atrial fibrillation: Secondary | ICD-10-CM

## 2019-06-06 DIAGNOSIS — M79672 Pain in left foot: Secondary | ICD-10-CM | POA: Diagnosis not present

## 2019-06-06 DIAGNOSIS — L110 Acquired keratosis follicularis: Secondary | ICD-10-CM | POA: Diagnosis not present

## 2019-06-06 DIAGNOSIS — I739 Peripheral vascular disease, unspecified: Secondary | ICD-10-CM | POA: Diagnosis not present

## 2019-06-06 DIAGNOSIS — I63512 Cerebral infarction due to unspecified occlusion or stenosis of left middle cerebral artery: Secondary | ICD-10-CM

## 2019-06-06 DIAGNOSIS — M79671 Pain in right foot: Secondary | ICD-10-CM | POA: Diagnosis not present

## 2019-06-06 MED ORDER — WARFARIN SODIUM 5 MG PO TABS
5.0000 mg | ORAL_TABLET | Freq: Every day | ORAL | 0 refills | Status: DC
Start: 2019-06-06 — End: 2019-09-15

## 2019-06-13 ENCOUNTER — Ambulatory Visit (INDEPENDENT_AMBULATORY_CARE_PROVIDER_SITE_OTHER): Payer: Medicare Other | Admitting: Family Medicine

## 2019-06-13 ENCOUNTER — Other Ambulatory Visit: Payer: Self-pay

## 2019-06-13 ENCOUNTER — Encounter: Payer: Self-pay | Admitting: Family Medicine

## 2019-06-13 ENCOUNTER — Telehealth: Payer: Self-pay | Admitting: Pharmacist

## 2019-06-13 VITALS — BP 159/74 | HR 64 | Temp 97.4°F | Ht 61.0 in | Wt 150.0 lb

## 2019-06-13 DIAGNOSIS — Z7901 Long term (current) use of anticoagulants: Secondary | ICD-10-CM

## 2019-06-13 DIAGNOSIS — Z7689 Persons encountering health services in other specified circumstances: Secondary | ICD-10-CM | POA: Diagnosis not present

## 2019-06-13 DIAGNOSIS — I10 Essential (primary) hypertension: Secondary | ICD-10-CM

## 2019-06-13 DIAGNOSIS — L409 Psoriasis, unspecified: Secondary | ICD-10-CM | POA: Diagnosis not present

## 2019-06-13 DIAGNOSIS — I4811 Longstanding persistent atrial fibrillation: Secondary | ICD-10-CM

## 2019-06-13 LAB — COAGUCHEK XS/INR WAIVED
INR: 2.8 — ABNORMAL HIGH (ref 0.9–1.1)
Prothrombin Time: 33.9 s

## 2019-06-13 LAB — POCT INR: INR: 2.8 (ref 2–3)

## 2019-06-13 MED ORDER — CLOBETASOL PROPIONATE 0.05 % EX CREA: TOPICAL_CREAM | CUTANEOUS | 2 refills | Status: DC

## 2019-06-13 NOTE — Patient Instructions (Signed)
Clobetasol cream sent for psoriasis. When we see each other again, if not getting better we will add the other cream we talked about.  I will reach out to Dr Bronson Ing about your coumadin.

## 2019-06-13 NOTE — Progress Notes (Signed)
Subjective: CC: est care, afib, chronic anticoagulation PCP: [name], DO [id number] Dominique Farley is a 86 y.o. female presenting to clinic today for:  1.  Atrial fibrillation/history of CVA Patient with atrial fibrillation and history of CVA in 10/2018.  She started on warfarin instead of one of the newer anticoagulants apparently because she was on primidone.  She denies any hematochezia, melena, vaginal bleeding, hematuria.  No heart palpitations.  No change in physical activity tolerance since CVA.  She does not drive and relies on her daughter totally for transportation.  2.  Psoriasis Patient reports multiple psoriatic plaques.  She was on some topical previously that was prescribed by Dr. Denna Haggard but notes that it was not very effective.  She has not seen him because of Covid.  However, she would like to discuss possible treatments today.  ROS: Per HPI  Allergies  Allergen Reactions  . Codeine Other (See Comments)  . Influenza Vaccines   . Sulfa Antibiotics Hives   Past Medical History:  Diagnosis Date  . Atrial fibrillation ([place])   . BCC (basal cell carcinoma of skin) 11/14/2004   Right Mid Cheek(Cx3,5FU)  . BCC (basal cell carcinoma of skin) 04/16/2011   Right Crease(tx p bx)  . BCC (basal cell carcinoma of skin) 06/16/2016   Left Nare(tx p bx)  . BCC (basal cell carcinoma of skin) Atypical Basaloid 09/29/2018   Left Side Nose(tx p bx)  . Hypertension   . Osteoporosis   . SCC (squamous cell carcinoma) 09/29/2018   Left Jawline(tx p bx)  . 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   Right Jawline(Cx3,5FU) and Left Upper Cheek(Cx3,5FU)  . 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   Left Jawline(tx p bx), Nose(tx p bx), Right Cheek(tx p bx), and Right Forearm(tx p bx)  . 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   Right Temple(Cx3,5FU) and Left Outer Eye(Cx3,5FU)  . 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   Right Forearm(Cx3,5FU)  .  Squamous cell carcinoma in situ (SCCIS) 03/13/2005   Left Side of Nose(Cx3,5FU)  . Squamous cell carcinoma in situ (SCCIS) 06/04/2005   Inner Right Cheek/Eye(Tx p Bx)  . Squamous cell carcinoma in situ (SCCIS) 02/12/2010   Right Cheek(tx p bx)  . 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 Stroke ([place])   . Tremor     Current Outpatient Medications:  .  amLODipine (NORVASC) 5 MG tablet, Take 1 tablet (5 mg total) by mouth daily., Disp: 90 tablet, Rfl: 0 .  aspirin EC 81 MG tablet, Take 81 mg by mouth daily. Every other day, Disp: , Rfl:  .  atorvastatin (LIPITOR) 40 MG tablet, Take 1 tablet (40 mg total) by mouth at bedtime., Disp: 90 tablet, Rfl: 0 .  lisinopril (ZESTRIL) 20 MG tablet, Take 1 tablet (20 mg total) by mouth daily., Disp: 90 tablet, Rfl: 0 .  polyethylene glycol (MIRALAX / GLYCOLAX) 17 g packet, Take 17 g by mouth daily., Disp: , Rfl:  .  primidone (MYSOLINE) 50 MG tablet, TAKE 1 TABLET BY MOUTH THREE TIMES DAILY AS DIRECTED, Disp: , Rfl:  .  propranolol (INDERAL) 60 MG tablet, Take 30 mg by mouth 2 (two) times daily., Disp: , Rfl:  .  sennosides-docusate sodium (SENOKOT-S) 8.6-50  MG tablet, Take 2 tablets by mouth daily., Disp: , Rfl:  .  warfarin (COUMADIN) 5 MG tablet, Take 1-1.5 tablets (5-7.5 mg total) by mouth daily. As directed, Disp: 120 tablet, Rfl: 0 Social History   Socioeconomic History  . Marital status: Widowed    Spouse name: Not on file  . Number of children: 3  . Years of education: Not on file  . Highest education level: Not on file  Occupational History  . Occupation: Retired  Tobacco Use  . Smoking status: Never Smoker  . Smokeless tobacco: Never Used  Substance and Sexual Activity  . Alcohol use: Never  . Drug use: Never  . Sexual activity: Not Currently  Other Topics  Concern  . Not on file  Social History Narrative  . Not on file   Social Determinants of Health   Financial Resource Strain:   . Difficulty of Paying Living Expenses:   Food Insecurity:   . Worried About Charity fundraiser in the Last Year:   . Arboriculturist in the Last Year:   Transportation Needs:   . Film/video editor (Medical):   Marland Kitchen Lack of Transportation (Non-Medical):   Physical Activity:   . Days of Exercise per Week:   . Minutes of Exercise per Session:   Stress:   . Feeling of Stress :   Social Connections:   . Frequency of Communication with Friends and Family:   . Frequency of Social Gatherings with Friends and Family:   . Attends Religious Services:   . Active Member of Clubs or Organizations:   . Attends Archivist Meetings:   Marland Kitchen Marital Status:   Intimate Partner Violence:   . Fear of Current or Ex-Partner:   . Emotionally Abused:   Marland Kitchen Physically Abused:   . Sexually Abused:    Family History  Problem Relation Age of Onset  . Dementia Mother   . Stroke Father   . Heart disease Father   . Diabetes Sister   . Stroke Son     Objective: Office vital signs reviewed. BP (!) 162/78   Pulse 64   Temp (!) 97.4 F (36.3 C)   Ht 5\' 1"  (1.549 m)   Wt 150 lb (68 kg)   SpO2 100%   BMI 28.34 kg/m   Physical Examination:  General: Awake, alert, thin elderly female, No acute distress Cardio:irregularly irregular. S1S2 heard, no murmurs appreciated Pulm: clear to auscultation bilaterally, no wheezes, rhonchi or rales; normal work of breathing on room air Extremities: warm, well perfused, No edema, cyanosis or clubbing; +2 pulses bilaterally Neuro: facial tremor noted.  Assessment/ Plan: 86 y.o. female   1. White coat syndrome with diagnosis of hypertension Blood pressures at home systolics are in the [id number].  Her daughter reiterates during our exam today.  We discussed the risks of uncontrolled blood pressure and recurrent stroke.  She voices  good understanding.  No changes to medications made today. - POCT INR  2. Establishing care with new doctor, encounter for - POCT INR  3. Longstanding persistent atrial fibrillation (HCC) INR is therapeutic at 2.8.  Initially, I was unsure as to why this patient is on Coumadin rather than something like Xarelto or Eliquis but apparently there is a drug drug interaction with the Primidone. Unfortunately, she does have history of stroke and she is not able to drive and that she is totally dependent on her daughter for transportation.  This has been somewhat difficult getting to  the office every month.  If she has to stay on warfarin, we discussed possible home monitoring. - POCT INR  4. Chronic anticoagulation - POCT INR - CoaguChek XS/INR Waived  5. Psoriasis Treat with Temovate. - clobetasol cream (TEMOVATE) 0.05 %; Apply to affected areas twice daily until psoriatic lesion resolved. AVOID face.  Dispense: 60 g; Refill: 2   No orders of the defined types were placed in this encounter.  No orders of the defined types were placed in this encounter.    [name], DO [place] [phone]

## 2019-06-14 ENCOUNTER — Encounter: Payer: Self-pay | Admitting: Orthopaedic Surgery

## 2019-06-14 ENCOUNTER — Ambulatory Visit (INDEPENDENT_AMBULATORY_CARE_PROVIDER_SITE_OTHER): Payer: Medicare Other | Admitting: Orthopaedic Surgery

## 2019-06-14 DIAGNOSIS — M170 Bilateral primary osteoarthritis of knee: Secondary | ICD-10-CM | POA: Diagnosis not present

## 2019-06-14 MED ORDER — HYLAN G-F 20 16 MG/2ML IX SOSY
16.0000 mg | PREFILLED_SYRINGE | INTRA_ARTICULAR | Status: AC | PRN
  Administered 2019-06-14: 16 mg via INTRA_ARTICULAR

## 2019-06-14 NOTE — Progress Notes (Signed)
Office Visit Note   Patient: Dominique Farley           Date of Birth: 27-Nov-1926           MRN: [id number] Visit Date: 06/14/2019              Requested by: [name], DO [place],  [place] [address] PCP: [name], DO   Assessment & Plan: Visit Diagnoses:  1. Bilateral primary osteoarthritis of knee     Plan: First Synvisc injection left knee.  Return weekly for the next 2 weeks to complete the series  Follow-Up Instructions: Return in about 1 week (around 06/21/2019).   Orders:  Orders Placed This Encounter  Procedures  . Large Joint Inj: L knee   No orders of the defined types were placed in this encounter.     Procedures: Large Joint Inj: L knee on 06/14/2019 2:57 PM Indications: pain and joint swelling Details: 25 G 1.5 in needle, anteromedial approach  Arthrogram: No  Medications: 16 mg Hylan 16 MG/2ML Outcome: tolerated well, no immediate complications Procedure, treatment alternatives, risks and benefits explained, specific risks discussed. Consent was given by the patient. Immediately prior to procedure a time out was called to verify the correct patient, procedure, equipment, support staff and site/side marked as required. Patient was prepped and draped in the usual sterile fashion.       Clinical Data: No additional findings.   Subjective: Chief Complaint  Patient presents with  . Left Knee - Pain    Synvisc started 06-14-2019  Patient presents today for the first Synvisc injection in her left knee.  HPI  Review of Systems   Objective: Vital Signs: Ht 5\' 1"  (1.549 m)   Wt 150 lb (68 kg)   BMI 28.34 kg/m   Physical Exam  Ortho Exam small effusion left knee.  Full extension over 100 degrees of flexion without instability.  Hypertrophic changes particularly medially with some discomfort along the medial more than the lateral joint Specialty Comments:  No specialty comments available.  Imaging: No results  found.   PMFS History: Patient Active Problem List   Diagnosis Date Noted  . Bilateral primary osteoarthritis of knee 06/14/2019  . Atrial fibrillation ([place]) 11/04/2018  . Encounter for monitoring Coumadin therapy 11/04/2018  . Cerebrovascular accident (CVA) due to occlusion of left middle cerebral artery ([place]) 10/18/2018  . Unilateral primary osteoarthritis, right knee 10/05/2018  . Tremor 08/24/2018  . Essential hypertension 08/24/2018  . Age related osteoporosis 08/24/2018   Past Medical History:  Diagnosis Date  . Atrial fibrillation ([place])   . BCC (basal cell carcinoma of skin) 11/14/2004   Right Mid Cheek(Cx3,5FU)  . BCC (basal cell carcinoma of skin) 04/16/2011   Right Crease(tx p bx)  . BCC (basal cell carcinoma of skin) 06/16/2016   Left Nare(tx p bx)  . BCC (basal cell carcinoma of skin) Atypical Basaloid 09/29/2018   Left Side Nose(tx p bx)  . Hypertension   . Osteoporosis   . SCC (squamous cell carcinoma) 09/29/2018   Left Jawline(tx p bx)  . 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   Right Jawline(Cx3,5FU) and Left Upper Cheek(Cx3,5FU)  . 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   Left Jawline(tx p bx), Nose(tx p bx), Right Cheek(tx p bx), and Right Forearm(tx p bx)  . 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   Right Temple(Cx3,5FU) and Left Outer Eye(Cx3,5FU)  . Squamous cell carcinoma in situ (  SCCIS) 11/14/2004   Right Forearm(Cx3,5FU)  . Squamous cell carcinoma in situ (SCCIS) 03/13/2005   Left Side of Nose(Cx3,5FU)  . Squamous cell carcinoma in situ (SCCIS) 06/04/2005   Inner Right Cheek/Eye(Tx p Bx)  . Squamous cell carcinoma in situ (SCCIS) 02/12/2010   Right Cheek(tx p bx)  . 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 Stroke ([place])   . Tremor     Family  History  Problem Relation Age of Onset  . Dementia Mother   . Stroke Father   . Heart disease Father   . Diabetes Sister   . Stroke Son     Past Surgical History:  Procedure Laterality Date  . CHOLECYSTECTOMY     Social History   Occupational History  . Occupation: Retired  Tobacco Use  . Smoking status: Never Smoker  . Smokeless tobacco: Never Used  Substance and Sexual Activity  . Alcohol use: Never  . Drug use: Never  . Sexual activity: Not Currently

## 2019-06-15 ENCOUNTER — Ambulatory Visit: Payer: Medicare Other | Admitting: Neurology

## 2019-06-15 ENCOUNTER — Encounter: Payer: Self-pay | Admitting: Neurology

## 2019-06-15 ENCOUNTER — Other Ambulatory Visit: Payer: Self-pay

## 2019-06-15 VITALS — BP 132/66 | HR 86 | Wt 149.4 lb

## 2019-06-15 DIAGNOSIS — G250 Essential tremor: Secondary | ICD-10-CM | POA: Diagnosis not present

## 2019-06-15 DIAGNOSIS — I6990 Unspecified sequelae of unspecified cerebrovascular disease: Secondary | ICD-10-CM | POA: Diagnosis not present

## 2019-06-15 NOTE — Progress Notes (Signed)
[facility] [address] [place]. [place] [address] [phone]       OFFICE CONSULT NOTE  Ms. Dominique Farley Date of Birth:  [birth year] Medical Record Number:  [id number]   Referring MD: Particia Nearing, PA-C  Reason for Referral: Stroke  HPI: Dominique Farley is a pleasant 86 year old Caucasian lady with past medical history of hypertension who is seen today for initial office visit following admission in October 2020 for stroke.  History is obtained from her, her granddaughter is accompanying her, review of electronic medical records and care everywhere I have reviewed imaging films reports though actual films were not available for review personally.  She presented on 10/18/2018 to [facility] where she was found lying on the floor with aphasia and right-sided weakness.  NIH stroke scale was 13 on admission.  She presented beyond time window for IV TPA.  Teleneurology consultation was obtained and CT angiogram suggested a left M1 occlusion.  Patient was airlifted to [facility] in [place] and underwent emergent thrombectomy by Dr. Dorthea Cove.  MRI scan showed left caudate head, corona radiata centrum semiovale and external capsule infarct.  Echocardiogram carotid Dopplers were unremarkable.  Patient was found to be in new onset atrial fibrillation for which she was started on warfarin.  She has continued on atorvastatin.  Hospital course was complicated by UTI from E. coli for which she was treated with antibiotics.  Patient showed improvement in inpatient rehab was suggested but insurance denied it and patient daughter insisted on taking her home.  She has been home living with her daughter.  She has been getting home physical occupational and speech therapy.  She is done well she is recently been discharged from occupational therapy with still getting physical therapy.  She can walk short distances independently without assistance but does use a walker  for long distances.  She also is getting speech therapy and speech is improving.  She had significant dysphagia which also appears to have improved.  The daughter states that from physical standpoint she has made good recovery from a stroke but from cognitive standpoint she is at significant memory loss and cognitive impairment post stroke.  Patient cannot be left alone.  There is a caregiver overnight and during the day one of her daughters lives with her.  She also has benign essential tremor for several years and there is a strong family history of the same.  She has been using primidone and Inderal and the tremor appears to be stable over the years.  She is tolerating warfarin well though her INR has been fluctuating and yesterday was 1.5 and medication dosage has been increased by her primary physician.  Blood pressure is well controlled. Update 06/15/2019 : She returns for follow-up after last visit on 01/23/2019.  She is accompanied by her daughter.  She continues to do well without recurrent stroke or TIA symptoms.  She continues to have mild right-sided weakness and gait imbalance but has had no falls or injuries.  She remains on warfarin and last INR was 2.8 on 06/13/2019.  She is a mild bruising but no major bleeding episodes.  She still lives alone but has some caregivers visit twice a day.  She needs some help with meals but otherwise can manage most of her affairs.  She and her primary care physician are wondering if she can switch warfarin to Eliquis but review of her labs do not show any recent lab work. ROS:   14 system  review of systems is positive for speech difficulties, word finding difficulties, memory loss, gait imbalance, tremors and all other systems negative PMH:  Past Medical History:  Diagnosis Date  . Atrial fibrillation ([place])   . BCC (basal cell carcinoma of skin) 11/14/2004   Right Mid Cheek(Cx3,5FU)  . BCC (basal cell carcinoma of skin) 04/16/2011   Right Crease(tx p bx)  . BCC  (basal cell carcinoma of skin) 06/16/2016   Left Nare(tx p bx)  . BCC (basal cell carcinoma of skin) Atypical Basaloid 09/29/2018   Left Side Nose(tx p bx)  . Hypertension   . Osteoporosis   . SCC (squamous cell carcinoma) 09/29/2018   Left Jawline(tx p bx)  . 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   Right Jawline(Cx3,5FU) and Left Upper Cheek(Cx3,5FU)  . 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   Left Jawline(tx p bx), Nose(tx p bx), Right Cheek(tx p bx), and Right Forearm(tx p bx)  . 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   Right Temple(Cx3,5FU) and Left Outer Eye(Cx3,5FU)  . 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   Right Forearm(Cx3,5FU)  . Squamous cell carcinoma in situ (SCCIS) 03/13/2005   Left Side of Nose(Cx3,5FU)  . Squamous cell carcinoma in situ (SCCIS) 06/04/2005   Inner Right Cheek/Eye(Tx p Bx)  . Squamous cell carcinoma in situ (SCCIS) 02/12/2010   Right Cheek(tx p bx)  . 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 Stroke ([place])   . Tremor     Social History:  Social History   Socioeconomic History  . Marital status: Widowed    Spouse name: Not on file  . Number of children: 3  . Years of education: Not on file  . Highest education level: Not on file  Occupational History  . Occupation: Retired  Tobacco Use  . Smoking status: Never Smoker  . Smokeless tobacco: Never Used  Substance and Sexual Activity  . Alcohol use: Never  . Drug use: Never  . Sexual activity: Not Currently  Other Topics Concern  . Not on file  Social History Narrative  . Not on file   Social Determinants of Health   Financial Resource Strain:   . Difficulty of Paying Living Expenses:   Food Insecurity:   . Worried About Charity fundraiser in the Last Year:   . Arts development officer in the Last Year:   Transportation Needs:   . Film/video editor (Medical):   Marland Kitchen Lack of Transportation (Non-Medical):   Physical Activity:   . Days of Exercise per Week:   . Minutes of Exercise per Session:   Stress:   . Feeling of Stress :   Social Connections:   . Frequency of Communication with Friends and Family:   . Frequency of Social Gatherings with Friends and Family:   . Attends Religious Services:   . Active Member of Clubs or Organizations:   . Attends Archivist Meetings:   Marland Kitchen Marital Status:   Intimate Partner Violence:   . Fear of Current or Ex-Partner:   . Emotionally Abused:   Marland Kitchen Physically Abused:   . Sexually Abused:     Medications:   Current Outpatient Medications on File Prior to Visit  Medication Sig Dispense Refill  .  amLODipine (NORVASC) 5 MG tablet Take 1 tablet (5 mg total) by mouth daily. 90 tablet 0  . aspirin EC 81 MG tablet Take 81 mg by mouth daily. Every other day    . atorvastatin (LIPITOR) 40 MG tablet Take 1 tablet (40 mg total) by mouth at bedtime. 90 tablet 0  . clobetasol cream (TEMOVATE) 0.05 % Apply to affected areas twice daily until psoriatic lesion resolved. AVOID face. 60 g 2  . lisinopril (ZESTRIL) 20 MG tablet Take 1 tablet (20 mg total) by mouth daily. 90 tablet 0  . polyethylene glycol (MIRALAX / GLYCOLAX) 17 g packet Take 17 g by mouth daily.    . primidone (MYSOLINE) 50 MG tablet TAKE 1 TABLET BY MOUTH THREE TIMES DAILY AS DIRECTED    . propranolol (INDERAL) 60 MG tablet Take 30 mg by mouth 2 (two) times daily.    . sennosides-docusate sodium (SENOKOT-S) 8.6-50 MG tablet Take 2 tablets by mouth daily.    Marland Kitchen warfarin (COUMADIN) 5 MG tablet Take 1-1.5 tablets (5-7.5 mg total) by mouth daily. As directed 120 tablet 0   No current facility-administered medications on file prior to visit.    Allergies:   Allergies  Allergen Reactions  . Codeine Other (See Comments)  . Influenza Vaccines   . Sulfa  Antibiotics Hives    Physical Exam General: well developed, well nourished elderly Caucasian lady, seated, in no evident distress Head: head normocephalic and atraumatic.   Neck: supple with no carotid or supraclavicular bruits Cardiovascular: regular rate and rhythm, no murmurs Musculoskeletal: no deformity Skin:  no rash/petichiae Vascular:  Normal pulses all extremities  Neurologic Exam Mental Status: Awake and fully alert. Oriented to place and time. Recent and remote memory poor attention span, concentration and fund of knowledge diminished mood and affect appropriate.  Recall 1/3.  Slightly nonfluent speech with occasional word hesitancy and word finding difficulty.  Good comprehension and naming and repetition  cranial Nerves: Fundoscopic exam reveals sharp disc margins. Pupils equal, briskly reactive to light. Extraocular movements full without nystagmus. Visual fields full to confrontation. Hearing intact. Facial sensation intact.  Mild right lower facial weakness, tongue, palate moves normally and symmetrically.  Motor: Normal bulk and tone. Normal strength in all tested extremity muscles except diminished fine finger movements on the right.  Orbits left over right upper extremity.  Mild weakness of right hip flexors and ankle dorsiflexors only.  Tone is slightly increased on the right compared to the left.  Mild action tremor of both outstretched upper extremities left greater than right.  Intermittent head tremor as well. Sensory.:  diminished touch , pinprick , position and vibratory sensation on the right hemibody.  Coordination: Rapid alternating movements normal in all extremities. Finger-to-nose and heel-to-shin performed accurately bilaterally. Gait and Station: Arises from chair without difficulty. Stance is slightly stooped. Gait demonstrates slight dragging of the right leg..  Unable to heel, toe and tandem walk .  Reflexes: 1+ and symmetric. Toes downgoing.   NIHSS   5 Modified Rankin  4   ASSESSMENT: 86 year old Caucasian lady with embolic left MCA infarct and October 2020 secondary to left middle cerebral artery occlusion from new onset atrial fibrillation treated with successful mechanical thrombectomy with good clinical outcome.  She has residual vascular cognitive impairment and minimal right-sided weakness.  Chronic longstanding benign essential tremor which is stable     PLAN: I had a long d/w patient and her daughter about her remote stroke, atrial fibrillation,risk for recurrent stroke/TIAs, personally independently  reviewed imaging studies and stroke evaluation results and answered questions.Continue warfarin daily  for secondary stroke prevention for now but check comprehensive metabolic panel labs with eGFR and if renal function is okay consider switching to Eliquis if she can afford the co-pay and maintain strict control of hypertension with blood pressure goal below 130/90, diabetes with hemoglobin A1c goal below 6.5% and lipids with LDL cholesterol goal below 70 mg/dL. I also advised the patient to eat a healthy diet with plenty of whole grains, cereals, fruits and vegetables, exercise regularly and maintain ideal body weight .  Followup in the future with my nurse practitioner Janett Billow in 6 months or call earlier if necessary.    Greater than 50% time during this 30 minute   visit was spent on counseling and coordination of care about her embolic stroke, atrial fibrillation and vascular cognitive impairment and answering questions she will return for follow-up in 3 months or call earlier if necessary. Antony Contras, MD  [facility] [address] [place], [place] [address]  Phone [phone] Fax [phone]  Note: This document was prepared with digital dictation and possible smart phrase technology. Any transcriptional errors that result from this process are unintentional.

## 2019-06-15 NOTE — Patient Instructions (Addendum)
I had a long d/w patient and her daughter about her remote stroke, atrial fibrillation,risk for recurrent stroke/TIAs, personally independently reviewed imaging studies and stroke evaluation results and answered questions.Continue warfarin daily  for secondary stroke prevention for now but check comprehensive metabolic panel labs with eGFR and if renal function is okay consider switching to Eliquis if she can afford the co-pay and maintain strict control of hypertension with blood pressure goal below 130/90, diabetes with hemoglobin A1c goal below 6.5% and lipids with LDL cholesterol goal below 70 mg/dL. I also advised the patient to eat a healthy diet with plenty of whole grains, cereals, fruits and vegetables, exercise regularly and maintain ideal body weight .continue present dose of primidone 50 mg 3 times daily for essential tremor which also appears stable.  Followup in the future with my nurse practitioner Jessica in 6 months or call earlier if necessary.  ° °

## 2019-06-15 NOTE — Progress Notes (Signed)
cmp

## 2019-06-15 NOTE — Progress Notes (Signed)
It appears to me that pt was already on warfarin prior to stroke service seeing her hence not changed. If her renal function permits changing to eliquis  if she can afford it is a viable option

## 2019-06-16 LAB — COMPREHENSIVE METABOLIC PANEL
ALT: 20 IU/L (ref 0–32)
AST: 26 IU/L (ref 0–40)
Albumin/Globulin Ratio: 1.5 (ref 1.2–2.2)
Albumin: 4.3 g/dL (ref 3.5–4.6)
Alkaline Phosphatase: 112 IU/L (ref 48–121)
BUN/Creatinine Ratio: 25 (ref 12–28)
BUN: 19 mg/dL (ref 10–36)
Bilirubin Total: 0.3 mg/dL (ref 0.0–1.2)
CO2: 25 mmol/L (ref 20–29)
Calcium: 9.8 mg/dL (ref 8.7–10.3)
Chloride: 101 mmol/L (ref 96–106)
Creatinine, Ser: 0.76 mg/dL (ref 0.57–1.00)
GFR calc Af Amer: 80 mL/min/{1.73_m2} (ref >59)
GFR calc non Af Amer: 69 mL/min/{1.73_m2} (ref >59)
Globulin, Total: 2.8 g/dL (ref 1.5–4.5)
Glucose: 106 mg/dL — ABNORMAL HIGH (ref 65–99)
Potassium: 4.8 mmol/L (ref 3.5–5.2)
Sodium: 139 mmol/L (ref 134–144)
Total Protein: 7.1 g/dL (ref 6.0–8.5)

## 2019-06-16 NOTE — Progress Notes (Signed)
Kindly let patient know that lab work was all fine

## 2019-06-19 ENCOUNTER — Other Ambulatory Visit: Payer: Self-pay | Admitting: Family Medicine

## 2019-06-19 DIAGNOSIS — R251 Tremor, unspecified: Secondary | ICD-10-CM

## 2019-06-20 NOTE — Progress Notes (Signed)
Thanks Amy :)

## 2019-06-20 NOTE — Telephone Encounter (Signed)
Patient on warfarin (afib, past stroke) and primidone for essential tremor.  Transitioning to DOAC was discussed, however drug drug interaction (see below) and cost remain as issues.  Neuro okay w/ Eliquis if renal function appropriate.  Will continue warfarin for now.  Per [employer]: Avoid concomitant use of apixaban (a CYP3A4 substrate) (Prod Info ELIQUIS oral tablets, 2018) with primidone as the combination may result in reduced apixaban exposure Merleen Nicely et al, 2019) and an increased risk of thrombotic events Edison Pace et al, 2018). Consider a vitamin K antagonist with close INR monitoring instead of a direct-acting oral anticoagulant (DOAC), or use an antiepileptic drug without known influence on P-glycoprotein or CYP3A4 (eg, ethosuximide, gabapentin, lamotrigine, pregabalin, and zonisamide) (Stollberger & Prompton, 2016)

## 2019-06-21 ENCOUNTER — Other Ambulatory Visit: Payer: Self-pay | Admitting: Neurology

## 2019-06-21 ENCOUNTER — Telehealth: Payer: Self-pay

## 2019-06-21 DIAGNOSIS — R251 Tremor, unspecified: Secondary | ICD-10-CM

## 2019-06-21 NOTE — Telephone Encounter (Signed)
This is prescribed by Dr Leonie Man, her neurologist.  Please have pharmacy contact them.

## 2019-06-21 NOTE — Telephone Encounter (Signed)
I spoke to patient and clinic pharmacist about this already.  Apparently on warfarin due to Primidone use.  This unfortunately interferes with the NOACs.   Please inform patient's daughter of drug interaction with eliquis/ xarelto.

## 2019-06-21 NOTE — Telephone Encounter (Signed)
-----   Message from Garvin Fila, MD sent at 06/16/2019  6:19 AM EDT ----- Mitchell Heir let patient know that lab work was all fine

## 2019-06-21 NOTE — Telephone Encounter (Signed)
I called [name] pts daughter that per Dr Leonie Man labs were okay and normal. She verbalized understanding. She wanted labs sent to PCP to evaluate for eliquis. She verbalized understanding

## 2019-06-21 NOTE — Telephone Encounter (Signed)
Left message with details about medication interactions and ask to please call our office for further questions.

## 2019-06-28 ENCOUNTER — Encounter: Payer: Self-pay | Admitting: Orthopaedic Surgery

## 2019-06-28 ENCOUNTER — Ambulatory Visit (INDEPENDENT_AMBULATORY_CARE_PROVIDER_SITE_OTHER): Payer: Medicare Other | Admitting: Orthopaedic Surgery

## 2019-06-28 ENCOUNTER — Other Ambulatory Visit: Payer: Self-pay

## 2019-06-28 VITALS — Ht 61.0 in | Wt 149.0 lb

## 2019-06-28 DIAGNOSIS — M170 Bilateral primary osteoarthritis of knee: Secondary | ICD-10-CM

## 2019-06-28 MED ORDER — HYLAN G-F 20 16 MG/2ML IX SOSY
16.0000 mg | PREFILLED_SYRINGE | INTRA_ARTICULAR | Status: AC | PRN
  Administered 2019-06-28: 16 mg via INTRA_ARTICULAR

## 2019-06-28 NOTE — Progress Notes (Signed)
Office Visit Note   Patient: Dominique Farley           Date of Birth: [birth year]           MRN: [id number] Visit Date: 06/28/2019              Requested by: [name], DO [place],  [place] [id number] PCP: [name], DO   Assessment & Plan: Visit Diagnoses:  1. Bilateral primary osteoarthritis of knee     Plan: Second Synvisc injection left knee. No problems referable to the first injection. Return next week to complete the series of three  Follow-Up Instructions: Return in about 1 week (around 07/05/2019).   Orders:  Orders Placed This Encounter  Procedures  . Large Joint Inj: L knee   No orders of the defined types were placed in this encounter.     Procedures: Large Joint Inj: L knee on 06/28/2019 3:00 PM Indications: pain and joint swelling Details: 25 G 1.5 in needle, anteromedial approach  Arthrogram: No  Medications: 16 mg Hylan 16 MG/2ML Outcome: tolerated well, no immediate complications Procedure, treatment alternatives, risks and benefits explained, specific risks discussed. Consent was given by the patient. Immediately prior to procedure a time out was called to verify the correct patient, procedure, equipment, support staff and site/side marked as required. Patient was prepped and draped in the usual sterile fashion.       Clinical Data: No additional findings.   Subjective: Chief Complaint  Patient presents with  . Left Knee - Follow-up    Synvisc started 06-14-2019  Patient presents today for the second Synvisc injection in her left knee. She started the series on 06-14-2019. She states that she has not noticed improvement yet.  HPI  Review of Systems   Objective: Vital Signs: Ht 5\' 1"  (1.549 m)   Wt 149 lb (67.6 kg)   BMI 28.15 kg/m   Physical Exam  Ortho Exam minimal effusion left knee. Minimal pain medial joint. Full extension and no instability  Specialty Comments:  No specialty comments  available.  Imaging: No results found.   PMFS History: Patient Active Problem List   Diagnosis Date Noted  . Bilateral primary osteoarthritis of knee 06/14/2019  . Atrial fibrillation ([place]) 11/04/2018  . Encounter for monitoring Coumadin therapy 11/04/2018  . Cerebrovascular accident (CVA) due to occlusion of left middle cerebral artery ([place]) 10/18/2018  . Unilateral primary osteoarthritis, right knee 10/05/2018  . Tremor 08/24/2018  . Essential hypertension 08/24/2018  . Age related osteoporosis 08/24/2018   Past Medical History:  Diagnosis Date  . Atrial fibrillation ([place])   . BCC (basal cell carcinoma of skin) 11/14/2004   Right Mid Cheek(Cx3,5FU)  . BCC (basal cell carcinoma of skin) 04/16/2011   Right Crease(tx p bx)  . BCC (basal cell carcinoma of skin) 06/16/2016   Left Nare(tx p bx)  . BCC (basal cell carcinoma of skin) Atypical Basaloid 09/29/2018   Left Side Nose(tx p bx)  . Hypertension   . Osteoporosis   . SCC (squamous cell carcinoma) 09/29/2018   Left Jawline(tx p bx)  . 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   Right Jawline(Cx3,5FU) and Left Upper Cheek(Cx3,5FU)  . 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   Left Jawline(tx p bx), Nose(tx p bx), Right Cheek(tx p bx), and Right Forearm(tx p bx)  . 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   Right Temple(Cx3,5FU) and Left Outer Eye(Cx3,5FU)  . Squamous cell  carcinoma in situ (SCCIS) 11/14/2004   Right Forearm(Cx3,5FU)  . Squamous cell carcinoma in situ (SCCIS) 03/13/2005   Left Side of Nose(Cx3,5FU)  . Squamous cell carcinoma in situ (SCCIS) 06/04/2005   Inner Right Cheek/Eye(Tx p Bx)  . Squamous cell carcinoma in situ (SCCIS) 02/12/2010   Right Cheek(tx p bx)  . 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  Stroke ([place])   . Tremor     Family History  Problem Relation Age of Onset  . Dementia Mother   . Stroke Father   . Heart disease Father   . Diabetes Sister   . Stroke Son     Past Surgical History:  Procedure Laterality Date  . CHOLECYSTECTOMY     Social History   Occupational History  . Occupation: Retired  Tobacco Use  . Smoking status: Never Smoker  . Smokeless tobacco: Never Used  Vaping Use  . Vaping Use: Never used  Substance and Sexual Activity  . Alcohol use: Never  . Drug use: Never  . Sexual activity: Not Currently

## 2019-07-12 ENCOUNTER — Other Ambulatory Visit: Payer: Self-pay

## 2019-07-12 ENCOUNTER — Encounter: Payer: Self-pay | Admitting: Orthopaedic Surgery

## 2019-07-12 ENCOUNTER — Ambulatory Visit (INDEPENDENT_AMBULATORY_CARE_PROVIDER_SITE_OTHER): Payer: Medicare Other | Admitting: Orthopaedic Surgery

## 2019-07-12 DIAGNOSIS — M1712 Unilateral primary osteoarthritis, left knee: Secondary | ICD-10-CM | POA: Insufficient documentation

## 2019-07-12 MED ORDER — HYLAN G-F 20 16 MG/2ML IX SOSY
16.0000 mg | PREFILLED_SYRINGE | INTRA_ARTICULAR | Status: AC | PRN
  Administered 2019-07-12: 16 mg via INTRA_ARTICULAR

## 2019-07-12 NOTE — Progress Notes (Signed)
Office Visit Note   Patient: Dominique Farley           Date of Birth: July 12, 1926           MRN: [id number] Visit Date: 07/12/2019              Requested by: [name], DO [place],  [place] [address] PCP: [name], DO   Assessment & Plan: Visit Diagnoses:  1. Unilateral primary osteoarthritis, left knee     Plan: Third Synvisc injection left knee.  Has done quite well with the prior to injections with minimal discomfort  Follow-Up Instructions: Return if symptoms worsen or fail to improve.   Orders:  Orders Placed This Encounter  Procedures  . Large Joint Inj: L knee   No orders of the defined types were placed in this encounter.     Procedures: Large Joint Inj: L knee on 07/12/2019 3:56 PM Indications: pain and joint swelling Details: 25 G 1.5 in needle, anterolateral approach  Arthrogram: No  Medications: 16 mg Hylan 16 MG/2ML Outcome: tolerated well, no immediate complications Procedure, treatment alternatives, risks and benefits explained, specific risks discussed. Consent was given by the patient. Immediately prior to procedure a time out was called to verify the correct patient, procedure, equipment, support staff and site/side marked as required. Patient was prepped and draped in the usual sterile fashion.       Clinical Data: No additional findings.   Subjective: Chief Complaint  Patient presents with  . Left Knee - Pain, Follow-up    Synvisc Injection #3  Patent returns for Synvisc Injection #3 in the left knee. She denies any problem with previous injections and states that she feels injections are helping.   HPI  Review of Systems   Objective: Vital Signs: Ht 5\' 1"  (1.549 m)   Wt 149 lb (67.6 kg)   BMI 28.15 kg/m   Physical Exam  Ortho Exam left knee was not hot red warm or swollen.  No effusion.  No pain with ambulation Specialty Comments:  No specialty comments available.  Imaging: No results  found.   PMFS History: Patient Active Problem List   Diagnosis Date Noted  . Unilateral primary osteoarthritis, left knee 07/12/2019  . Bilateral primary osteoarthritis of knee 06/14/2019  . Atrial fibrillation ([place]) 11/04/2018  . Encounter for monitoring Coumadin therapy 11/04/2018  . Cerebrovascular accident (CVA) due to occlusion of left middle cerebral artery ([place]) 10/18/2018  . Unilateral primary osteoarthritis, right knee 10/05/2018  . Tremor 08/24/2018  . Essential hypertension 08/24/2018  . Age related osteoporosis 08/24/2018   Past Medical History:  Diagnosis Date  . Atrial fibrillation ([place])   . BCC (basal cell carcinoma of skin) 11/14/2004   Right Mid Cheek(Cx3,5FU)  . BCC (basal cell carcinoma of skin) 04/16/2011   Right Crease(tx p bx)  . BCC (basal cell carcinoma of skin) 06/16/2016   Left Nare(tx p bx)  . BCC (basal cell carcinoma of skin) Atypical Basaloid 09/29/2018   Left Side Nose(tx p bx)  . Hypertension   . Osteoporosis   . SCC (squamous cell carcinoma) 09/29/2018   Left Jawline(tx p bx)  . 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   Right Jawline(Cx3,5FU) and Left Upper Cheek(Cx3,5FU)  . 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   Left Jawline(tx p bx), Nose(tx p bx), Right Cheek(tx p bx), and Right Forearm(tx p bx)  . 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   Right  Temple(Cx3,5FU) and Left Outer Eye(Cx3,5FU)  . 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   Right Forearm(Cx3,5FU)  . Squamous cell carcinoma in situ (SCCIS) 03/13/2005   Left Side of Nose(Cx3,5FU)  . Squamous cell carcinoma in situ (SCCIS) 06/04/2005   Inner Right Cheek/Eye(Tx p Bx)  . Squamous cell carcinoma in situ (SCCIS) 02/12/2010   Right Cheek(tx p bx)  . 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 Stroke ([place])   . Tremor     Family History  Problem Relation Age of Onset  . Dementia Mother   . Stroke Father   . Heart disease Father   . Diabetes Sister   . Stroke Son     Past Surgical History:  Procedure Laterality Date  . CHOLECYSTECTOMY     Social History   Occupational History  . Occupation: Retired  Tobacco Use  . Smoking status: Never Smoker  . Smokeless tobacco: Never Used  Vaping Use  . Vaping Use: Never used  Substance and Sexual Activity  . Alcohol use: Never  . Drug use: Never  . Sexual activity: Not Currently

## 2019-07-13 ENCOUNTER — Other Ambulatory Visit: Payer: Self-pay | Admitting: Family Medicine

## 2019-07-13 DIAGNOSIS — I10 Essential (primary) hypertension: Secondary | ICD-10-CM

## 2019-07-21 ENCOUNTER — Encounter: Payer: Self-pay | Admitting: Family Medicine

## 2019-07-21 ENCOUNTER — Other Ambulatory Visit: Payer: Self-pay

## 2019-07-21 ENCOUNTER — Ambulatory Visit (INDEPENDENT_AMBULATORY_CARE_PROVIDER_SITE_OTHER): Payer: Medicare Other | Admitting: Family Medicine

## 2019-07-21 VITALS — BP 138/70 | HR 58 | Temp 97.3°F | Ht 61.0 in | Wt 149.2 lb

## 2019-07-21 DIAGNOSIS — Z7901 Long term (current) use of anticoagulants: Secondary | ICD-10-CM

## 2019-07-21 DIAGNOSIS — I4811 Longstanding persistent atrial fibrillation: Secondary | ICD-10-CM | POA: Diagnosis not present

## 2019-07-21 LAB — COAGUCHEK XS/INR WAIVED
INR: 2.2 — ABNORMAL HIGH (ref 0.9–1.1)
Prothrombin Time: 26.2 s

## 2019-07-21 NOTE — Progress Notes (Signed)
Subjective: CC: Afib, chronic anticoagulation PCP: [name], DO [id number] Dominique Farley is a 86 y.o. female, who is accompanied by her daughter today.  Presenting to clinic today for:  1.  Atrial fibrillation/history of CVA Patient with atrial fibrillation and history of CVA in 10/2018.  She started on warfarin instead of one of the newer anticoagulants apparently because she was on primidone.  Continues to take her warfarin as directed.  She denies any hematochezia, melena, vaginal bleeding, hematuria or epistaxis.  No heart palpitation, chest pain, shortness of breath.  No falls  ROS: Per HPI  Allergies  Allergen Reactions  . Codeine Other (See Comments)  . Influenza Vaccines   . Sulfa Antibiotics Hives   Past Medical History:  Diagnosis Date  . Atrial fibrillation ([place])   . BCC (basal cell carcinoma of skin) 11/14/2004   Right Mid Cheek(Cx3,5FU)  . BCC (basal cell carcinoma of skin) 04/16/2011   Right Crease(tx p bx)  . BCC (basal cell carcinoma of skin) 06/16/2016   Left Nare(tx p bx)  . BCC (basal cell carcinoma of skin) Atypical Basaloid 09/29/2018   Left Side Nose(tx p bx)  . Hypertension   . Osteoporosis   . SCC (squamous cell carcinoma) 09/29/2018   Left Jawline(tx p bx)  . 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   Right Jawline(Cx3,5FU) and Left Upper Cheek(Cx3,5FU)  . 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   Left Jawline(tx p bx), Nose(tx p bx), Right Cheek(tx p bx), and Right Forearm(tx p bx)  . 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   Right Temple(Cx3,5FU) and Left Outer Eye(Cx3,5FU)  . 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   Right Forearm(Cx3,5FU)  . Squamous cell carcinoma in situ (SCCIS) 03/13/2005   Left Side of Nose(Cx3,5FU)  . Squamous cell carcinoma in situ (SCCIS) 06/04/2005   Inner Right Cheek/Eye(Tx p Bx)  . Squamous cell carcinoma in situ (SCCIS) 02/12/2010   Right Cheek(tx p bx)  . Squamous cell  carcinoma in situ (SCCIS) x 2 09/29/2018   Upper Lip(tx p bx) and Bridge of Nose(tx p bx)  . 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 Stroke ([place])   . Tremor     Current Outpatient Medications:  .  amLODipine (NORVASC) 5 MG tablet, Take 1 tablet (5 mg total) by mouth daily., Disp: 90 tablet, Rfl: 0 .  aspirin EC 81 MG tablet, Take 81 mg by mouth daily. Every other day, Disp: , Rfl:  .  atorvastatin (LIPITOR) 40 MG tablet, Take 1 tablet (40 mg total) by mouth at bedtime., Disp: 90 tablet, Rfl: 0 .  clobetasol cream (TEMOVATE) 0.05 %, Apply to affected areas twice daily until psoriatic lesion resolved. AVOID face., Disp: 60 g, Rfl: 2 .  lisinopril (ZESTRIL) 20 MG tablet, Take 1 tablet (20 mg total) by mouth daily., Disp: 90 tablet, Rfl: 0 .  polyethylene glycol (MIRALAX / GLYCOLAX) 17 g packet, Take 17 g by mouth daily., Disp: , Rfl:  .  primidone (MYSOLINE) 50 MG tablet, TAKE 1 TABLET BY MOUTH THREE TIMES DAILY AS DIRECTED, Disp: 270 tablet, Rfl: 4 .  propranolol (INDERAL) 60 MG tablet, TAKE 1 TABLET BY MOUTH TWICE DAILY, Disp: 60 tablet, Rfl: 11 .  sennosides-docusate sodium (SENOKOT-S) 8.6-50 MG tablet, Take 2 tablets by mouth daily., Disp: , Rfl:  .  warfarin (COUMADIN) 5 MG tablet, Take 1-1.5 tablets (5-7.5  mg total) by mouth daily. As directed, Disp: 120 tablet, Rfl: 0 Social History   Socioeconomic History  . Marital status: Widowed    Spouse name: Not on file  . Number of children: 3  . Years of education: Not on file  . Highest education level: Not on file  Occupational History  . Occupation: Retired  Tobacco Use  . Smoking status: Never Smoker  . Smokeless tobacco: Never Used  Vaping Use  . Vaping Use: Never used  Substance and Sexual Activity  . Alcohol use: Never  . Drug use: Never  . Sexual activity: Not Currently  Other Topics Concern  . Not on file  Social History Narrative  . Not on file    Social Determinants of Health   Financial Resource Strain:   . Difficulty of Paying Living Expenses:   Food Insecurity:   . Worried About Charity fundraiser in the Last Year:   . Arboriculturist in the Last Year:   Transportation Needs:   . Film/video editor (Medical):   Marland Kitchen Lack of Transportation (Non-Medical):   Physical Activity:   . Days of Exercise per Week:   . Minutes of Exercise per Session:   Stress:   . Feeling of Stress :   Social Connections:   . Frequency of Communication with Friends and Family:   . Frequency of Social Gatherings with Friends and Family:   . Attends Religious Services:   . Active Member of Clubs or Organizations:   . Attends Archivist Meetings:   Marland Kitchen Marital Status:   Intimate Partner Violence:   . Fear of Current or Ex-Partner:   . Emotionally Abused:   Marland Kitchen Physically Abused:   . Sexually Abused:    Family History  Problem Relation Age of Onset  . Dementia Mother   . Stroke Father   . Heart disease Father   . Diabetes Sister   . Stroke Son     Objective: Office vital signs reviewed. BP 138/70   Pulse (!) 58   Temp (!) 97.3 F (36.3 C)   Ht 5\' 1"  (1.549 m)   Wt 149 lb 3.2 oz (67.7 kg)   SpO2 99%   BMI 28.19 kg/m   Physical Examination:  General: Awake, alert, thin elderly female, No acute distress Cardio: irregularly irregular, bradycardic rate. S1S2 heard, no murmurs appreciated Pulm: clear to auscultation bilaterally, no wheezes, rhonchi or rales; normal work of breathing on room air Extremities: warm, well perfused, No edema, cyanosis or clubbing; +2 pulses bilaterally Neuro: jaw tremor noted.  Assessment/ Plan: 86 y.o. female   1. Longstanding persistent atrial fibrillation (HCC) INR is therapeutic.  Continue current regimen.  Follow-up in 4 to 6 weeks, sooner if needed.  2. Chronic anticoagulation - CoaguChek XS/INR Waived   Orders Placed This Encounter  Procedures  . CoaguChek XS/INR Waived   No  orders of the defined types were placed in this encounter.    [name], DO [place] [phone]

## 2019-08-03 ENCOUNTER — Telehealth: Payer: Self-pay | Admitting: Family Medicine

## 2019-08-03 NOTE — Telephone Encounter (Signed)
Pts daughter called stating that they believe pt may have to go back up the dosage of the Propanolol because they are noticing that pt shakes a lot more and chin looks jerked and mouth looks saggy.

## 2019-08-04 NOTE — Telephone Encounter (Signed)
Pt's daughter aware of provider feedback and voiced understanding. °

## 2019-08-04 NOTE — Telephone Encounter (Signed)
Given her risk of hypotension and bradycardia (HR already in the 50's), I'm not sure going up on the dose of Propranolol is a good idea.  I would recommend she follow up with neurology.  Perhaps her Primidone can be adjusted?

## 2019-08-06 ENCOUNTER — Other Ambulatory Visit: Payer: Self-pay | Admitting: Family Medicine

## 2019-08-06 DIAGNOSIS — I63512 Cerebral infarction due to unspecified occlusion or stenosis of left middle cerebral artery: Secondary | ICD-10-CM

## 2019-08-06 DIAGNOSIS — I10 Essential (primary) hypertension: Secondary | ICD-10-CM

## 2019-08-29 DIAGNOSIS — M79671 Pain in right foot: Secondary | ICD-10-CM | POA: Diagnosis not present

## 2019-08-29 DIAGNOSIS — I739 Peripheral vascular disease, unspecified: Secondary | ICD-10-CM | POA: Diagnosis not present

## 2019-08-29 DIAGNOSIS — M79672 Pain in left foot: Secondary | ICD-10-CM | POA: Diagnosis not present

## 2019-08-29 DIAGNOSIS — L110 Acquired keratosis follicularis: Secondary | ICD-10-CM | POA: Diagnosis not present

## 2019-09-04 ENCOUNTER — Encounter: Payer: Self-pay | Admitting: Family Medicine

## 2019-09-04 ENCOUNTER — Ambulatory Visit (INDEPENDENT_AMBULATORY_CARE_PROVIDER_SITE_OTHER): Payer: Medicare Other | Admitting: Family Medicine

## 2019-09-04 ENCOUNTER — Other Ambulatory Visit: Payer: Self-pay

## 2019-09-04 VITALS — BP 138/82 | HR 61 | Temp 97.4°F | Ht 61.0 in | Wt 150.0 lb

## 2019-09-04 DIAGNOSIS — S81812A Laceration without foreign body, left lower leg, initial encounter: Secondary | ICD-10-CM | POA: Diagnosis not present

## 2019-09-04 DIAGNOSIS — I10 Essential (primary) hypertension: Secondary | ICD-10-CM | POA: Diagnosis not present

## 2019-09-04 DIAGNOSIS — Z7901 Long term (current) use of anticoagulants: Secondary | ICD-10-CM

## 2019-09-04 DIAGNOSIS — I4811 Longstanding persistent atrial fibrillation: Secondary | ICD-10-CM | POA: Diagnosis not present

## 2019-09-04 DIAGNOSIS — G250 Essential tremor: Secondary | ICD-10-CM

## 2019-09-04 DIAGNOSIS — Z23 Encounter for immunization: Secondary | ICD-10-CM | POA: Diagnosis not present

## 2019-09-04 LAB — COAGUCHEK XS/INR WAIVED
INR: 2.5 — ABNORMAL HIGH (ref 0.9–1.1)
Prothrombin Time: 29.6 s

## 2019-09-04 MED ORDER — PROPRANOLOL HCL 60 MG PO TABS: 30.0000 mg | ORAL_TABLET | Freq: Two times a day (BID) | ORAL | 11 refills | Status: DC

## 2019-09-04 NOTE — Patient Instructions (Signed)
Schedule with Dermatology for your calf lesions You got a tetanus shot today for the laceration I have sent you home with medicated bandages, use once daily after baths.   Wound Care, Adult Taking care of your wound properly can help to prevent pain, infection, and scarring. It can also help your wound to heal more quickly. How to care for your wound Wound care      Follow instructions from your health care provider about how to take care of your wound. Make sure you: ? Wash your hands with soap and water before you change the bandage (dressing). If soap and water are not available, use hand sanitizer. ? Change your dressing as told by your health care provider. ? Leave stitches (sutures), skin glue, or adhesive strips in place. These skin closures may need to stay in place for 2 weeks or longer. If adhesive strip edges start to loosen and curl up, you may trim the loose edges. Do not remove adhesive strips completely unless your health care provider tells you to do that.  Check your wound area every day for signs of infection. Check for: ? Redness, swelling, or pain. ? Fluid or blood. ? Warmth. ? Pus or a bad smell.  Ask your health care provider if you should clean the wound with mild soap and water. Doing this may include: ? Using a clean towel to pat the wound dry after cleaning it. Do not rub or scrub the wound. ? Applying a cream or ointment. Do this only as told by your health care provider. ? Covering the incision with a clean dressing.  Ask your health care provider when you can leave the wound uncovered.  Keep the dressing dry until your health care provider says it can be removed. Do not take baths, swim, use a hot tub, or do anything that would put the wound underwater until your health care provider approves. Ask your health care provider if you can take showers. You may only be allowed to take sponge baths. Medicines   If you were prescribed an antibiotic medicine,  cream, or ointment, take or use the antibiotic as told by your health care provider. Do not stop taking or using the antibiotic even if your condition improves.  Take over-the-counter and prescription medicines only as told by your health care provider. If you were prescribed pain medicine, take it 30 or more minutes before you do any wound care or as told by your health care provider. General instructions  Return to your normal activities as told by your health care provider. Ask your health care provider what activities are safe.  Do not scratch or pick at the wound.  Do not use any products that contain nicotine or tobacco, such as cigarettes and e-cigarettes. These may delay wound healing. If you need help quitting, ask your health care provider.  Keep all follow-up visits as told by your health care provider. This is important.  Eat a diet that includes protein, vitamin A, vitamin C, and other nutrient-rich foods to help the wound heal. ? Foods rich in protein include meat, dairy, beans, nuts, and other sources. ? Foods rich in vitamin A include carrots and dark green, leafy vegetables. ? Foods rich in vitamin C include citrus, tomatoes, and other fruits and vegetables. ? Nutrient-rich foods have protein, carbohydrates, fat, vitamins, or minerals. Eat a variety of healthy foods including vegetables, fruits, and whole grains. Contact a health care provider if:  You received a tetanus shot and  you have swelling, severe pain, redness, or bleeding at the injection site.  Your pain is not controlled with medicine.  You have redness, swelling, or pain around the wound.  You have fluid or blood coming from the wound.  Your wound feels warm to the touch.  You have pus or a bad smell coming from the wound.  You have a fever or chills.  You are nauseous or you vomit.  You are dizzy. Get help right away if:  You have a red streak going away from your wound.  The edges of the wound  open up and separate.  Your wound is bleeding, and the bleeding does not stop with gentle pressure.  You have a rash.  You faint.  You have trouble breathing. Summary  Always wash your hands with soap and water before changing your bandage (dressing).  To help with healing, eat foods that are rich in protein, vitamin A, vitamin C, and other nutrients.  Check your wound every day for signs of infection. Contact your health care provider if you suspect that your wound is infected. This information is not intended to replace advice given to you by your health care provider. Make sure you discuss any questions you have with your health care provider. Document Revised: 04/18/2018 Document Reviewed: 07/16/2015 Elsevier Patient Education  [place].

## 2019-09-04 NOTE — Progress Notes (Signed)
Subjective: CC: Afib, chronic anticoagulation PCP: [name], DO UQJ:FHLK Dominique Farley is a 86 y.o. female, who is accompanied by her daughter today.  Presenting to clinic today for:  1.  Atrial fibrillation/history of CVA Patient with atrial fibrillation and history of CVA in 10/2018.  She started on warfarin instead of one of the newer anticoagulants apparently because she was on primidone.  Takes 1.5 tablets of Coumadin on Mondays and Thursdays with 1 tablet daily all other days.  She has had some intermittent bleeding from a laceration she sustained on the left leg.  This has been attempting to heal for the last 3 weeks.  She has been applying Neosporin and some other medicated cream in efforts to improve it but it has not yet healed over.  She also has a few lesions on the posterior calves that they would like me to take a look at.  Apparently these lesions were expected to be removed by her dermatologist but she unfortunately was hospitalized before this could be done.  She continues to take her propranolol 30 mg twice daily for tremors.  ROS: Per HPI  Allergies  Allergen Reactions  . Codeine Other (See Comments)  . Influenza Vaccines   . Sulfa Antibiotics Hives   Past Medical History:  Diagnosis Date  . Atrial fibrillation ([place])   . BCC (basal cell carcinoma of skin) 11/14/2004   Right Mid Cheek(Cx3,5FU)  . BCC (basal cell carcinoma of skin) 04/16/2011   Right Crease(tx p bx)  . BCC (basal cell carcinoma of skin) 06/16/2016   Left Nare(tx p bx)  . BCC (basal cell carcinoma of skin) Atypical Basaloid 09/29/2018   Left Side Nose(tx p bx)  . Hypertension   . Osteoporosis   . SCC (squamous cell carcinoma) 09/29/2018   Left Jawline(tx p bx)  . 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   Right Jawline(Cx3,5FU) and Left Upper Cheek(Cx3,5FU)  . 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   Left Jawline(tx p bx), Nose(tx p bx), Right Cheek(tx p bx), and Right Forearm(tx p  bx)  . 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   Right [place](Cx3,5FU) and Left Outer Eye(Cx3,5FU)  . 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   Right Forearm(Cx3,5FU)  . Squamous cell carcinoma in situ (SCCIS) 03/13/2005   Left Side of Nose(Cx3,5FU)  . Squamous cell carcinoma in situ (SCCIS) 06/04/2005   Inner Right Cheek/Eye(Tx p Bx)  . Squamous cell carcinoma in situ (SCCIS) 02/12/2010   Right Cheek(tx p bx)  . 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 Stroke ([place])   . Tremor     Current Outpatient Medications:  .  amLODipine (NORVASC) 5 MG tablet, TAKE 1 TABLET BY MOUTH EVERY DAY, Disp: 90 tablet, Rfl: 0 .  aspirin EC 81 MG tablet, Take 81 mg by mouth daily. Every other day, Disp: , Rfl:  .  atorvastatin (LIPITOR) 40 MG tablet, TAKE 1 TABLET BY MOUTH AT BEDTIME, Disp: 90 tablet, Rfl: 0 .  clobetasol cream (TEMOVATE) 0.05 %, Apply to affected areas twice daily until psoriatic lesion resolved. AVOID face., Disp: 60 g, Rfl: 2 .  lisinopril (ZESTRIL) 20 MG tablet, TAKE 1 TABLET BY MOUTH EVERY DAY, Disp: 90 tablet, Rfl: 0 .  polyethylene glycol (  MIRALAX / GLYCOLAX) 17 g packet, Take 17 g by mouth daily., Disp: , Rfl:  .  primidone (MYSOLINE) 50 MG tablet, TAKE 1 TABLET BY MOUTH THREE TIMES DAILY AS DIRECTED, Disp: 270 tablet, Rfl: 4 .  propranolol (INDERAL) 60 MG tablet, TAKE 1 TABLET BY MOUTH TWICE DAILY, Disp: 60 tablet, Rfl: 11 .  sennosides-docusate sodium (SENOKOT-S) 8.6-50 MG tablet, Take 2 tablets by mouth daily., Disp: , Rfl:  .  warfarin (COUMADIN) 5 MG tablet, Take 1-1.5 tablets (5-7.5 mg total) by mouth daily. As directed, Disp: 120 tablet, Rfl: 0 Social History   Socioeconomic History  . Marital status: Widowed    Spouse name: Not on file  . Number of children: 3  .  Years of education: Not on file  . Highest education level: Not on file  Occupational History  . Occupation: Retired  Tobacco Use  . Smoking status: Never Smoker  . Smokeless tobacco: Never Used  Vaping Use  . Vaping Use: Never used  Substance and Sexual Activity  . Alcohol use: Never  . Drug use: Never  . Sexual activity: Not Currently  Other Topics Concern  . Not on file  Social History Narrative  . Not on file   Social Determinants of Health   Financial Resource Strain:   . Difficulty of Paying Living Expenses: Not on file  Food Insecurity:   . Worried About Charity fundraiser in the Last Year: Not on file  . Ran Out of Food in the Last Year: Not on file  Transportation Needs:   . Lack of Transportation (Medical): Not on file  . Lack of Transportation (Non-Medical): Not on file  Physical Activity:   . Days of Exercise per Week: Not on file  . Minutes of Exercise per Session: Not on file  Stress:   . Feeling of Stress : Not on file  Social Connections:   . Frequency of Communication with Friends and Family: Not on file  . Frequency of Social Gatherings with Friends and Family: Not on file  . Attends Religious Services: Not on file  . Active Member of Clubs or Organizations: Not on file  . Attends Archivist Meetings: Not on file  . Marital Status: Not on file  Intimate Partner Violence:   . Fear of Current or Ex-Partner: Not on file  . Emotionally Abused: Not on file  . Physically Abused: Not on file  . Sexually Abused: Not on file   Family History  Problem Relation Age of Onset  . Dementia Mother   . Stroke Father   . Heart disease Father   . Diabetes Sister   . Stroke Son     Objective: Office vital signs reviewed. BP 138/82   Pulse 61   Temp (!) 97.4 F (36.3 C) (Temporal)   Ht 5\' 1"  (1.549 m)   Wt 150 lb (68 kg)   SpO2 99%   BMI 28.34 kg/m   Physical Examination:  General: Awake, alert, thin elderly female, No acute  distress Cardio: irregularly irregular, bradycardic rate. S1S2 heard, no murmurs appreciated Pulm: clear to auscultation bilaterally, no wheezes, rhonchi or rales; normal work of breathing on room air Extremities: warm, well perfused, No edema, cyanosis or clubbing; +2 pulses bilaterally Skin: She has a bleeding skin lesion on the right posterior calf.  Left lower extremity with irregularly shaped skin tear that appears to be healing.  There is mild surrounding erythema but no warmth, fluctuance, induration or tenderness to palpation.  No active drainage.  Assessment/ Plan: 86 y.o. female   1. Longstanding persistent atrial fibrillation (HCC) INR therapeutic.  Continue current regimen.  Follow-up in 6 weeks - CoaguChek XS/INR Waived  2. Chronic anticoagulation - CoaguChek XS/INR Waived  3. Essential hypertension Controlled. - propranolol (INDERAL) 60 MG tablet; Take 0.5 tablets (30 mg total) by mouth 2 (two) times daily. For tremors  Dispense: 30 tablet; Refill: 11  4. Essential tremor Continue propranolol for tremor.  I have adjusted her prescription to reflect current usage - propranolol (INDERAL) 60 MG tablet; Take 0.5 tablets (30 mg total) by mouth 2 (two) times daily. For tremors  Dispense: 30 tablet; Refill: 11  5. Laceration of left lower extremity, initial encounter This was dressed today with a Xeroform bandage.  Home care instructions reviewed with the patient.  Tetanus shot was updated   No orders of the defined types were placed in this encounter.  No orders of the defined types were placed in this encounter.    [name], DO [place] [phone]

## 2019-09-15 ENCOUNTER — Other Ambulatory Visit: Payer: Self-pay | Admitting: Family Medicine

## 2019-09-15 DIAGNOSIS — I63512 Cerebral infarction due to unspecified occlusion or stenosis of left middle cerebral artery: Secondary | ICD-10-CM

## 2019-09-15 DIAGNOSIS — I4811 Longstanding persistent atrial fibrillation: Secondary | ICD-10-CM

## 2019-10-06 ENCOUNTER — Ambulatory Visit: Payer: Medicare Other | Admitting: Physician Assistant

## 2019-10-06 ENCOUNTER — Encounter: Payer: Self-pay | Admitting: Physician Assistant

## 2019-10-06 ENCOUNTER — Other Ambulatory Visit: Payer: Self-pay

## 2019-10-06 DIAGNOSIS — D0472 Carcinoma in situ of skin of left lower limb, including hip: Secondary | ICD-10-CM

## 2019-10-06 DIAGNOSIS — Z1283 Encounter for screening for malignant neoplasm of skin: Secondary | ICD-10-CM | POA: Diagnosis not present

## 2019-10-06 DIAGNOSIS — D0471 Carcinoma in situ of skin of right lower limb, including hip: Secondary | ICD-10-CM | POA: Diagnosis not present

## 2019-10-06 DIAGNOSIS — D0470 Carcinoma in situ of skin of unspecified lower limb, including hip: Secondary | ICD-10-CM

## 2019-10-06 DIAGNOSIS — L570 Actinic keratosis: Secondary | ICD-10-CM | POA: Diagnosis not present

## 2019-10-06 MED ORDER — FLUOROURACIL 5 % EX CREA: TOPICAL_CREAM | Freq: Every day | CUTANEOUS | 1 refills | Status: DC

## 2019-10-06 MED ORDER — MUPIROCIN 2 % EX OINT: 1.0000 "application " | TOPICAL_OINTMENT | Freq: Two times a day (BID) | CUTANEOUS | 2 refills | Status: DC

## 2019-10-06 NOTE — Patient Instructions (Signed)

## 2019-10-06 NOTE — Progress Notes (Deleted)
Follow-Up Visit   Subjective  Dominique Farley is a 86 y.o. female who presents for the following: Skin Problem (check spots on both legs and some cause pain--also check face).Patient was seen with Dr. Lavonna Monarch.  The following portions of the chart were reviewed this encounter and updated as appropriate: Tobacco  Allergies  Meds  Problems  Med Hx  Surg Hx  Fam Hx      Objective  Well appearing patient in no apparent distress; mood and affect are within normal limits.  A focused examination was performed including face, arms and legs. Relevant physical exam findings are noted in the Assessment and Plan.  Objective  Head - Anterior (Face): Erythematous patches with gritty scale.  Objective  Right Lower Leg - Anterior: Hyperkeratotic scale with pink base      Objective  Left Lower Leg - Posterior: Hyperkeratotic scale with pink base      Objective  face, arms and legs:  No atypical nevi   Assessment & Plan   Given the patients age and the extent of the probable skin cancers we decided to do 2 painful lesions on her legs. We will follow her closely to see if her lower extremities can heal. We also encouraged elevation of her LE and provided ACE wraps for light compression to decrease swelling and assist in healing.  AK (actinic keratosis) Head - Anterior (Face)  fluorouracil (EFUDEX) 5 % cream - Head - Anterior (Face)  Neoplasm of uncertain behavior of skin (2) Right Lower Leg - Anterior  mupirocin ointment (BACTROBAN) 2 %  Skin / nail biopsy Type of biopsy: tangential   Informed consent: discussed and consent obtained   Timeout: patient name, date of birth, surgical site, and procedure verified   Procedure prep:  Patient was prepped and draped in usual sterile fashion (Non sterile) Prep type:  Chlorhexidine Anesthesia: the lesion was anesthetized in a standard fashion   Anesthetic:  1% lidocaine w/ epinephrine 1-100,000 local infiltration Instrument  used: flexible razor blade   Outcome: patient tolerated procedure well   Post-procedure details: wound care instructions given    Specimen 1 - Surgical pathology Differential Diagnosis: scc vs bcc Check Margins: No Inoculated with 85fu  Left Lower Leg - Posterior  mupirocin ointment (BACTROBAN) 2 %  Skin / nail biopsy Type of biopsy: tangential   Informed consent: discussed and consent obtained   Timeout: patient name, date of birth, surgical site, and procedure verified   Procedure prep:  Patient was prepped and draped in usual sterile fashion (Non sterile) Prep type:  Chlorhexidine Anesthesia: the lesion was anesthetized in a standard fashion   Anesthetic:  1% lidocaine w/ epinephrine 1-100,000 local infiltration Instrument used: flexible razor blade   Outcome: patient tolerated procedure well   Post-procedure details: wound care instructions given    Destruction of lesion Complexity: simple   Destruction method: electrodesiccation and curettage   Informed consent: discussed and consent obtained   Timeout:  patient name, date of birth, surgical site, and procedure verified Anesthesia: the lesion was anesthetized in a standard fashion   Anesthetic:  1% lidocaine w/ epinephrine 1-100,000 local infiltration Curettage performed in three different directions: Yes   Curettage cycles:  3 Lesion length (cm):  0 Lesion width (cm):  2 Margin per side (cm):  0 Final wound size (cm):  2 Hemostasis achieved with:  ferric subsulfate Outcome: patient tolerated procedure well with no complications   Additional details:  Wound innoculated with 5 fluorouracil solution.  Specimen 2 - Surgical pathology Differential Diagnosis: scc vs bcc Check Margins: No Ed&c  Screening exam for skin cancer face, arms and legs    I, Luwanna Brossman, PA-C, have reviewed all documentation's for this visit.  The documentation on 10/06/19 for the exam, diagnosis, procedures and orders are all accurate  and complete.

## 2019-10-10 ENCOUNTER — Telehealth: Payer: Self-pay | Admitting: Physician Assistant

## 2019-10-10 NOTE — Telephone Encounter (Signed)
Patient's daughter, [name], is calling to get directions on use for 2 medications that [name], [facility] sent in for her mom, Dominique Farley.  [name] says that she is travelling and does not know the names of the 2 prescriptions.

## 2019-10-11 NOTE — Telephone Encounter (Signed)
Called patient daughter and explained how to use Fluorouracil & Mupirocin.  Told to call back if needed

## 2019-10-25 ENCOUNTER — Other Ambulatory Visit: Payer: Self-pay

## 2019-10-25 ENCOUNTER — Encounter: Payer: Self-pay | Admitting: Family Medicine

## 2019-10-25 ENCOUNTER — Ambulatory Visit (INDEPENDENT_AMBULATORY_CARE_PROVIDER_SITE_OTHER): Payer: Medicare Other | Admitting: Family Medicine

## 2019-10-25 VITALS — BP 127/55 | HR 55 | Temp 98.1°F | Ht 61.0 in | Wt 148.0 lb

## 2019-10-25 DIAGNOSIS — D049 Carcinoma in situ of skin, unspecified: Secondary | ICD-10-CM | POA: Insufficient documentation

## 2019-10-25 DIAGNOSIS — I4811 Longstanding persistent atrial fibrillation: Secondary | ICD-10-CM | POA: Diagnosis not present

## 2019-10-25 DIAGNOSIS — T8189XA Other complications of procedures, not elsewhere classified, initial encounter: Secondary | ICD-10-CM | POA: Diagnosis not present

## 2019-10-25 DIAGNOSIS — I63512 Cerebral infarction due to unspecified occlusion or stenosis of left middle cerebral artery: Secondary | ICD-10-CM

## 2019-10-25 DIAGNOSIS — D692 Other nonthrombocytopenic purpura: Secondary | ICD-10-CM | POA: Diagnosis not present

## 2019-10-25 DIAGNOSIS — I6930 Unspecified sequelae of cerebral infarction: Secondary | ICD-10-CM

## 2019-10-25 DIAGNOSIS — Z7901 Long term (current) use of anticoagulants: Secondary | ICD-10-CM | POA: Diagnosis not present

## 2019-10-25 LAB — COAGUCHEK XS/INR WAIVED
INR: 1.8 — ABNORMAL HIGH (ref 0.9–1.1)
Prothrombin Time: 21.2 s

## 2019-10-25 LAB — POCT INR: INR: 1.8 — AB (ref 2–3)

## 2019-10-25 NOTE — Patient Instructions (Signed)
Take 1.5 tablets today.  Then resume normal dosing schedule. See me back next Friday for recheck.   Squamous Cell Carcinoma Squamous cell carcinoma is a common form of skin cancer. It begins in the squamous cells in the outer layer of the skin (epidermis). It occurs most often in parts of the body that are frequently exposed to the sun, such as the face, ears, lips, neck, arms, legs, and hands. However, this condition can occur anywhere on the body, including the inside of the mouth, the genital area, and the anus. If squamous cell carcinoma is treated early, it rarely spreads to other areas of the body (metastasizes). If it is not treated, it can affect nearby tissues. In rare cases, it can spread to other areas of the body. What are the causes? This condition is usually caused by exposure to ultraviolet (UV) light. UV light may come from the sun or from tanning beds.  Other causes include exposure to:  Arsenic.  Radiation.  Toxic tars and oils. In very rare cases, a genetic condition that makes a person sensitive to sunlight (xeroderma pigmentosum) may cause the condition. What increases the risk? This condition is more likely to develop in people who:  Are older than 84 years of age.  Have fair skin (light complexion).  Have blond or red hair.  Have blue, green, or gray eyes.  Have childhood freckling.  Have had sun exposure over long periods of time, especially during childhood.  Use tanning beds.  Have had psoralen and ultraviolet A (PUVA) treatments.  Have had repeated sunburns.  Have a weakened immune system.  Have an HPV (human papillomavirus) infection.  Have a history of precancerous lesions (actinic keratosis).  Have conditions that cause chronic scarring. These can include burn scars, chronic ulcers, heat (thermal) injuries, and radiation.  Have been exposed to certain chemicals, such as tar, soot, and arsenic.  Smoke. What are the signs or  symptoms?  This condition often starts as a red, pink, or brown growth on the skin. The growths have an irregular surface that may feel rough. In some cases, the growths are easier to feel than to see. The growths may develop into a sore that does not heal. How is this diagnosed? This condition may be diagnosed with:  A physical exam.  Removal of a tissue sample to be examined under a microscope (biopsy). How is this treated? Treatment for this condition involves removing the cancerous tissue. The method that is used for this depends on the size and location of the tumor, as well as your overall health. Possible treatments include:  Electrodesiccation and curettage. This involves alternately scraping and burning the tumor while using an electric current to control bleeding.  Cryosurgery. This involves freezing the tumor with liquid nitrogen.  Laser therapy. This uses an intense beam of light to remove the tumor.  Photodynamic therapy. A chemical cream is applied to the skin, and light exposure is used to activate the chemical.  High-energy rays that kill cancer cells (radiation therapy). This may be used for tumors that are deeper in the tissues.  Surgical removal (excision) of the tumor. This involves removing the entire tumor and a small amount of normal skin that surrounds it.  Mohs surgery. In this procedure, the cancerous skin cells are removed layer by layer until all of the tumor has been removed.  Plastic surgery. The tumor is removed, and healthy skin from another part of the body is used to cover the wound (skin  graft).  Chemotherapy. This treatment uses medicines to destroy cancer cells.  Chemotherapy creams or lotions. These may be applied directly to the skin where the tumor is located and may be used for smaller tumors.  Targeted therapy. This targets specific parts of cancer cells and the area around them to block the growth and spread of the cancer.  Immunotherapy.  This treatment helps your body's immune system fight the cancer cells. Follow these instructions at home:   Avoid being out in the sun. Wear protective clothing, including long-sleeved shirts, long pants, and a hat when outdoors.  Do skin self-exams as told by your health care provider. Look for any new spots or changes in your skin.  Do not use any products that contain nicotine or tobacco, such as cigarettes, e-cigarettes, and chewing tobacco. If you need help quitting, ask your health care provider.  Keep all follow-up visits as told by your health care provider. This is important. How is this prevented?   Avoid the sun when it is at its strongest. This is usually between 10:00 a.m. and 4:00 p.m.  When you are out in the sun, use a sunscreen that has a sun protection factor (SPF) of at least 54.  Apply sunscreen at least 30 minutes before exposure to the sun.  Reapply sunscreen every 2-4 hours while you are outside. Also reapply it after swimming and after excessive sweating.  Always wear hats, protective clothing, and UV-blocking sunglasses when you are outdoors.  Do not use tanning beds. Contact a health care provider if:  You notice any new growths or any changes in your skin.  You have had a squamous cell carcinoma tumor removed and you notice a new growth in the same location. Get help right away if you have:  A fever or chills.  Chest pain or difficulty breathing. Summary  Squamous cell carcinoma is a common form of skin cancer. It begins in the squamous cells in the outer layer of the skin (epidermis).  This condition is usually caused by exposure to ultraviolet (UV) light.  Treatment for this condition involves removing the cancerous tissue. The method that is used for this depends on the size and location of the tumor, as well as your overall health.  Contact a health care provider if you notice any new growths or any changes in your skin.  Keep all follow-up  visits as told by your health care provider. This is important. This information is not intended to replace advice given to you by your health care provider. Make sure you discuss any questions you have with your health care provider. Document Revised: 09/21/2017 Document Reviewed: 09/21/2017 Elsevier Patient Education  [employer].

## 2019-10-25 NOTE — Progress Notes (Signed)
Subjective: CC: Afib, chronic anticoagulation PCP: [name], DO JJK:KXFG Dominique Farley is a 86 y.o. female, who is accompanied by her daughter today.  Presenting to clinic today for:  1.  Atrial fibrillation/ SCC in situ Patient with atrial fibrillation and history of CVA in 10/2018.  She started on warfarin instead of one of the newer anticoagulants apparently because she was on primidone.  Takes 1.5 tablets of Coumadin on Mondays and Thursdays with 1 tablet daily all other days.  She was on antibiotics after skin biopsy with dermatology recently.  They have not received word as to what the biopsy results were yet.  Apparently her dermatologist is going out on surgical leave soon.  She denies any substantial bleeding from the postsurgical sites.  She had several taken off of both legs.  She was also using Efudex cream to the face.  ROS: Per HPI  Allergies  Allergen Reactions  . Codeine Other (See Comments)  . Influenza Vaccines   . Sulfa Antibiotics Hives   Past Medical History:  Diagnosis Date  . Atrial fibrillation ([place])   . BCC (basal cell carcinoma of skin) 11/14/2004   Right Mid Cheek(Cx3,5FU)  . BCC (basal cell carcinoma of skin) 04/16/2011   Right Crease(tx p bx)  . BCC (basal cell carcinoma of skin) 06/16/2016   Left Nare(tx p bx)  . BCC (basal cell carcinoma of skin) Atypical Basaloid 09/29/2018   Left Side Nose(tx p bx)  . Hypertension   . Osteoporosis   . SCC (squamous cell carcinoma) 09/29/2018   Left Jawline(tx p bx)  . 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   Right Jawline(Cx3,5FU) and Left Upper Cheek(Cx3,5FU)  . 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   Left Jawline(tx p bx), Nose(tx p bx), Right Cheek(tx p bx), and Right Forearm(tx p bx)  . 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   Right Temple(Cx3,5FU) and Left Outer Eye(Cx3,5FU)  . 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   Right Forearm(Cx3,5FU)  . Squamous  cell carcinoma in situ (SCCIS) 03/13/2005   Left Side of Nose(Cx3,5FU)  . Squamous cell carcinoma in situ (SCCIS) 06/04/2005   Inner Right Cheek/Eye(Tx p Bx)  . Squamous cell carcinoma in situ (SCCIS) 02/12/2010   Right Cheek(tx p bx)  . 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 Stroke ([place])   . Tremor     Current Outpatient Medications:  .  amLODipine (NORVASC) 5 MG tablet, TAKE 1 TABLET BY MOUTH EVERY DAY, Disp: 90 tablet, Rfl: 0 .  aspirin EC 81 MG tablet, Take 81 mg by mouth daily. Every other day, Disp: , Rfl:  .  atorvastatin (LIPITOR) 40 MG tablet, TAKE 1 TABLET BY MOUTH AT BEDTIME, Disp: 90 tablet, Rfl: 0 .  clobetasol cream (TEMOVATE) 0.05 %, Apply to affected areas twice daily until psoriatic lesion resolved. AVOID face., Disp: 60 g, Rfl: 2 .  fluorouracil (EFUDEX) 5 % cream, Apply topically at bedtime., Disp: 40 g, Rfl: 1 .  lisinopril (ZESTRIL) 20 MG tablet, TAKE 1 TABLET BY MOUTH EVERY DAY, Disp: 90 tablet, Rfl: 0 .  mupirocin ointment (BACTROBAN) 2 %, Apply 1 application topically 2 (two) times daily., Disp: 22 g, Rfl: 2 .  polyethylene glycol (MIRALAX / GLYCOLAX) 17 g  packet, Take 17 g by mouth daily., Disp: , Rfl:  .  primidone (MYSOLINE) 50 MG tablet, TAKE 1 TABLET BY MOUTH THREE TIMES DAILY AS DIRECTED, Disp: 270 tablet, Rfl: 4 .  propranolol (INDERAL) 60 MG tablet, Take 0.5 tablets (30 mg total) by mouth 2 (two) times daily. For tremors, Disp: 30 tablet, Rfl: 11 .  sennosides-docusate sodium (SENOKOT-S) 8.6-50 MG tablet, Take 2 tablets by mouth daily., Disp: , Rfl:  .  warfarin (COUMADIN) 5 MG tablet, TAKE 1-1.5 TABLETS BY MOUTH DAILY AS DIRECTED, Disp: 120 tablet, Rfl: 2 Social History   Socioeconomic History  . Marital status: Widowed    Spouse name: Not on file  . Number of children: 3    . Years of education: Not on file  . Highest education level: Not on file  Occupational History  . Occupation: Retired  Tobacco Use  . Smoking status: Never Smoker  . Smokeless tobacco: Never Used  Vaping Use  . Vaping Use: Never used  Substance and Sexual Activity  . Alcohol use: Never  . Drug use: Never  . Sexual activity: Not Currently  Other Topics Concern  . Not on file  Social History Narrative  . Not on file   Social Determinants of Health   Financial Resource Strain:   . Difficulty of Paying Living Expenses: Not on file  Food Insecurity:   . Worried About Charity fundraiser in the Last Year: Not on file  . Ran Out of Food in the Last Year: Not on file  Transportation Needs:   . Lack of Transportation (Medical): Not on file  . Lack of Transportation (Non-Medical): Not on file  Physical Activity:   . Days of Exercise per Week: Not on file  . Minutes of Exercise per Session: Not on file  Stress:   . Feeling of Stress : Not on file  Social Connections:   . Frequency of Communication with Friends and Family: Not on file  . Frequency of Social Gatherings with Friends and Family: Not on file  . Attends Religious Services: Not on file  . Active Member of Clubs or Organizations: Not on file  . Attends Archivist Meetings: Not on file  . Marital Status: Not on file  Intimate Partner Violence:   . Fear of Current or Ex-Partner: Not on file  . Emotionally Abused: Not on file  . Physically Abused: Not on file  . Sexually Abused: Not on file   Family History  Problem Relation Age of Onset  . Dementia Mother   . Stroke Father   . Heart disease Father   . Diabetes Sister   . Stroke Son     Objective: Office vital signs reviewed. BP (!) 127/55   Pulse (!) 55   Temp 98.1 F (36.7 C)   Ht 5\' 1"  (1.549 m)   Wt 148 lb (67.1 kg)   SpO2 97%   BMI 27.96 kg/m   Physical Examination:  General: Awake, alert, thin elderly, frail-appearing female, No acute  distress Cardio: Bradycardic Pulm:  normal work of breathing on room air Skin: Senile purpura noted.  Left posterior calf lesion is healing well.  It does have scant drainage on the bandage.  Approximately dime sized.  She has a postsurgical site noted along the left lateral ankle that is healing well.  She has similar on the right lower leg and right lower calf.  No evidence of secondary infection MSK: Ambulating with use of cane  Assessment/  Plan: 86 y.o. female   1. Longstanding persistent atrial fibrillation (HCC) INR is subtherapeutic at 1.8.  She will increase to 1/2 tablets today then resume normal dosing schedule of 1/2 tablets Mondays and Thursdays with 1 tablet daily all other days.  Follow-up INR appointment has been scheduled for next Friday.  Her daughter is aware of the appointment and appointment time - POCT INR  2. 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 Delayed surgical wound healing, initial encounter Likely secondary to thin skin.  Continue wound care.  Continue topical Bactroban as prescribed - POCT INR  5. Squamous cell carcinoma in situ (SCCIS) of skin Gave her a copy of the biopsy results.  Continue to follow-up with dermatology as scheduled    No orders of the defined types were placed in this encounter.  No orders of the defined types were placed in this encounter.    [name], DO [place] [phone]

## 2019-10-25 NOTE — Addendum Note (Signed)
Addended by: Alphonzo Dublin on: 10/25/2019 03:26 PM   Modules accepted: Orders

## 2019-10-25 NOTE — Addendum Note (Signed)
Addended by: Earlene Plater on: 10/25/2019 03:24 PM   Modules accepted: Orders

## 2019-11-03 ENCOUNTER — Ambulatory Visit (INDEPENDENT_AMBULATORY_CARE_PROVIDER_SITE_OTHER): Payer: Medicare Other | Admitting: Family Medicine

## 2019-11-03 ENCOUNTER — Other Ambulatory Visit: Payer: Self-pay

## 2019-11-03 ENCOUNTER — Encounter: Payer: Self-pay | Admitting: Family Medicine

## 2019-11-03 VITALS — BP 130/62 | HR 50 | Temp 96.9°F | Ht 60.0 in | Wt 149.0 lb

## 2019-11-03 DIAGNOSIS — I4811 Longstanding persistent atrial fibrillation: Secondary | ICD-10-CM

## 2019-11-03 DIAGNOSIS — Z7901 Long term (current) use of anticoagulants: Secondary | ICD-10-CM

## 2019-11-03 LAB — COAGUCHEK XS/INR WAIVED
INR: 3.9 — ABNORMAL HIGH (ref 0.9–1.1)
Prothrombin Time: 46.9 s

## 2019-11-03 NOTE — Patient Instructions (Signed)
Let's see each other in 1 week, next Friday 10/29 at 830am for recheck INR  Skip today's dose

## 2019-11-03 NOTE — Progress Notes (Signed)
Subjective: CC: f/u subtherapeutic INR PCP: [name], DO OAC:ZYSA Starzyk is a 86 y.o. female, who is accompanied by her daughter today.  Presenting to clinic today for:  1.  Atrial fibrillation  Patient with atrial fibrillation and history of CVA in 10/2018.  She started on warfarin instead of one of the newer anticoagulants apparently because she was on primidone.  INR was subtherapeutic at last visit.  She was instructed to increase by 1/2 tablet that day and then resume 1.5 tablets of Coumadin on Mondays and Thursdays with 1 tablet daily all other days.    She reports compliance with instructions. And she denies any bleeding episodes. No hematochezia, melena, epistaxis or vaginal bleeding. No hematuria.  ROS: Per HPI  Allergies  Allergen Reactions  . Codeine Other (See Comments)  . Influenza Vaccines   . Sulfa Antibiotics Hives   Past Medical History:  Diagnosis Date  . Atrial fibrillation ([place])   . BCC (basal cell carcinoma of skin) 11/14/2004   Right Mid Cheek(Cx3,5FU)  . BCC (basal cell carcinoma of skin) 04/16/2011   Right Crease(tx p bx)  . BCC (basal cell carcinoma of skin) 06/16/2016   Left Nare(tx p bx)  . BCC (basal cell carcinoma of skin) Atypical Basaloid 09/29/2018   Left Side Nose(tx p bx)  . Hypertension   . Osteoporosis   . SCC (squamous cell carcinoma) 09/29/2018   Left Jawline(tx p bx)  . 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   Right Jawline(Cx3,5FU) and Left Upper Cheek(Cx3,5FU)  . 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   Left Jawline(tx p bx), Nose(tx p bx), Right Cheek(tx p bx), and Right Forearm(tx p bx)  . 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   Right Temple(Cx3,5FU) and Left Outer Eye(Cx3,5FU)  . 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   Right Forearm(Cx3,5FU)  . Squamous cell carcinoma in situ (SCCIS) 03/13/2005   Left Side of Nose(Cx3,5FU)  . Squamous cell carcinoma in situ (SCCIS)  06/04/2005   Inner Right Cheek/Eye(Tx p Bx)  . Squamous cell carcinoma in situ (SCCIS) 02/12/2010   Right Cheek(tx p bx)  . 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 Stroke ([place])   . Tremor     Current Outpatient Medications:  .  amLODipine (NORVASC) 5 MG tablet, TAKE 1 TABLET BY MOUTH EVERY DAY, Disp: 90 tablet, Rfl: 0 .  aspirin EC 81 MG tablet, Take 81 mg by mouth daily. Every other day, Disp: , Rfl:  .  atorvastatin (LIPITOR) 40 MG tablet, TAKE 1 TABLET BY MOUTH AT BEDTIME, Disp: 90 tablet, Rfl: 0 .  clobetasol cream (TEMOVATE) 0.05 %, Apply to affected areas twice daily until psoriatic lesion resolved. AVOID face., Disp: 60 g, Rfl: 2 .  fluorouracil (EFUDEX) 5 % cream, Apply topically at bedtime., Disp: 40 g, Rfl: 1 .  lisinopril (ZESTRIL) 20 MG tablet, TAKE 1 TABLET BY MOUTH EVERY DAY, Disp: 90 tablet, Rfl: 0 .  mupirocin ointment (BACTROBAN) 2 %, Apply 1 application topically 2 (two) times daily., Disp: 22 g, Rfl: 2 .  polyethylene glycol (MIRALAX / GLYCOLAX) 17 g packet, Take 17 g by mouth daily., Disp: , Rfl:  .  primidone (MYSOLINE) 50 MG tablet, TAKE 1 TABLET BY MOUTH THREE TIMES  DAILY AS DIRECTED, Disp: 270 tablet, Rfl: 4 .  propranolol (INDERAL) 60 MG tablet, Take 0.5 tablets (30 mg total) by mouth 2 (two) times daily. For tremors, Disp: 30 tablet, Rfl: 11 .  warfarin (COUMADIN) 5 MG tablet, TAKE 1-1.5 TABLETS BY MOUTH DAILY AS DIRECTED, Disp: 120 tablet, Rfl: 2 Social History   Socioeconomic History  . Marital status: Widowed    Spouse name: Not on file  . Number of children: 3  . Years of education: Not on file  . Highest education level: Not on file  Occupational History  . Occupation: Retired  Tobacco Use  . Smoking status: Never Smoker  . Smokeless tobacco: Never Used  Vaping Use  .  Vaping Use: Never used  Substance and Sexual Activity  . Alcohol use: Never  . Drug use: Never  . Sexual activity: Not Currently  Other Topics Concern  . Not on file  Social History Narrative  . Not on file   Social Determinants of Health   Financial Resource Strain:   . Difficulty of Paying Living Expenses: Not on file  Food Insecurity:   . Worried About Charity fundraiser in the Last Year: Not on file  . Ran Out of Food in the Last Year: Not on file  Transportation Needs:   . Lack of Transportation (Medical): Not on file  . Lack of Transportation (Non-Medical): Not on file  Physical Activity:   . Days of Exercise per Week: Not on file  . Minutes of Exercise per Session: Not on file  Stress:   . Feeling of Stress : Not on file  Social Connections:   . Frequency of Communication with Friends and Family: Not on file  . Frequency of Social Gatherings with Friends and Family: Not on file  . Attends Religious Services: Not on file  . Active Member of Clubs or Organizations: Not on file  . Attends Archivist Meetings: Not on file  . Marital Status: Not on file  Intimate Partner Violence:   . Fear of Current or Ex-Partner: Not on file  . Emotionally Abused: Not on file  . Physically Abused: Not on file  . Sexually Abused: Not on file   Family History  Problem Relation Age of Onset  . Dementia Mother   . Stroke Father   . Heart disease Father   . Diabetes Sister   . Stroke Son     Objective: Office vital signs reviewed. BP 130/62   Pulse (!) 50   Temp (!) 96.9 F (36.1 C) (Temporal)   Ht 5' (1.524 m)   Wt 149 lb (67.6 kg)   SpO2 97%   BMI 29.10 kg/m   Physical Examination:  General: Awake, alert, thin elderly, frail-appearing female, No acute distress Cardio: Bradycardic rhythm. No murmurs appreciated Pulm: Clear to auscultation bilaterally. Normal work of breathing on room air. No wheezes, rhonchi or rales Skin: Senile purpura noted of bilateral  upper extremities  Assessment/ Plan: 86 y.o. female   1. Longstanding persistent atrial fibrillation (HCC) INR now supratherapeutic at 3.9. I have advised her to skip today's dose and resume normal dosing. She will need to follow-up in 1 week for repeat INR check. If she is near normal levels, okay to continue normal dosing schedule with a 4 to 6-week follow-up. - CoaguChek XS/INR Waived  2. Chronic anticoagulation - CoaguChek XS/INR Waived   No orders of the defined types were placed in this encounter.  No orders of the defined  types were placed in this encounter.    [name], DO [place] [phone]

## 2019-11-05 ENCOUNTER — Other Ambulatory Visit: Payer: Self-pay | Admitting: Family Medicine

## 2019-11-05 DIAGNOSIS — I10 Essential (primary) hypertension: Secondary | ICD-10-CM

## 2019-11-05 DIAGNOSIS — I63512 Cerebral infarction due to unspecified occlusion or stenosis of left middle cerebral artery: Secondary | ICD-10-CM

## 2019-11-06 ENCOUNTER — Telehealth: Payer: Self-pay

## 2019-11-10 ENCOUNTER — Ambulatory Visit: Payer: Medicare Other | Admitting: Nurse Practitioner

## 2019-11-10 ENCOUNTER — Ambulatory Visit: Payer: Medicare Other | Admitting: Family Medicine

## 2019-11-13 ENCOUNTER — Encounter: Payer: Self-pay | Admitting: Family

## 2019-11-13 ENCOUNTER — Telehealth: Payer: Self-pay

## 2019-11-13 ENCOUNTER — Ambulatory Visit (INDEPENDENT_AMBULATORY_CARE_PROVIDER_SITE_OTHER): Payer: Medicare Other | Admitting: Family

## 2019-11-13 ENCOUNTER — Other Ambulatory Visit: Payer: Self-pay

## 2019-11-13 VITALS — BP 142/66 | HR 67 | Temp 97.1°F | Ht 60.0 in | Wt 149.0 lb

## 2019-11-13 DIAGNOSIS — I63512 Cerebral infarction due to unspecified occlusion or stenosis of left middle cerebral artery: Secondary | ICD-10-CM

## 2019-11-13 DIAGNOSIS — Z5181 Encounter for therapeutic drug level monitoring: Secondary | ICD-10-CM | POA: Diagnosis not present

## 2019-11-13 DIAGNOSIS — Z7901 Long term (current) use of anticoagulants: Secondary | ICD-10-CM

## 2019-11-13 DIAGNOSIS — I4811 Longstanding persistent atrial fibrillation: Secondary | ICD-10-CM | POA: Diagnosis not present

## 2019-11-13 DIAGNOSIS — I6930 Unspecified sequelae of cerebral infarction: Secondary | ICD-10-CM | POA: Diagnosis not present

## 2019-11-13 LAB — COAGUCHEK XS/INR WAIVED
INR: 2.6 — ABNORMAL HIGH (ref 0.9–1.1)
Prothrombin Time: 30.8 s

## 2019-11-13 NOTE — Telephone Encounter (Signed)
-----   Message from Lavonna Monarch, MD sent at 11/13/2019 12:52 PM EDT ----- If the base not treated at the time of the biopsy, can schedule 30 minutes of Kelli or with me

## 2019-11-13 NOTE — Progress Notes (Signed)
   Subjective:    Patient ID: Dominique Farley, female    DOB: [birth year], 86 y.o.   MRN: [id number]  Chief Complaint  Patient presents with  . Anticoagulation    HPI Pt presents to the office today for INR check. She is currently taking warfarin for  A Fib and hx CVA.   Her INR today is 2.6 today. She is currently taking   Denies any bleeding, changes in diet, or changes in medications. States she has normal bruising.     Review of Systems  All other systems reviewed and are negative.      Objective:   Physical Exam Vitals reviewed.  Constitutional:      General: She is not in acute distress.    Appearance: She is well-developed.  HENT:     Head: Normocephalic and atraumatic.  Eyes:     Pupils: Pupils are equal, round, and reactive to light.  Neck:     Thyroid: No thyromegaly.  Cardiovascular:     Rate and Rhythm: Normal rate and regular rhythm.     Heart sounds: Normal heart sounds. No murmur heard.   Pulmonary:     Effort: Pulmonary effort is normal. No respiratory distress.     Breath sounds: Normal breath sounds. No wheezing.  Abdominal:     General: Bowel sounds are normal. There is no distension.     Palpations: Abdomen is soft.     Tenderness: There is no abdominal tenderness.  Musculoskeletal:        General: No tenderness. Normal range of motion.     Cervical back: Normal range of motion and neck supple.     Right lower leg: Edema (trace) present.     Left lower leg: Edema (trace) present.  Skin:    General: Skin is warm and dry.  Neurological:     Mental Status: She is alert and oriented to person, place, and time.     Cranial Nerves: No cranial nerve deficit.     Deep Tendon Reflexes: Reflexes are normal and symmetric.  Psychiatric:        Behavior: Behavior normal.        Thought Content: Thought content normal.        Judgment: Judgment normal.       BP (!) 142/66   Pulse 67   Temp (!) 97.1 F (36.2 C) (Temporal)   Ht 5' (1.524 m)   Wt  149 lb (67.6 kg)   SpO2 97%   BMI 29.10 kg/m      Assessment & Plan:  Dominique Farley comes in today with chief complaint of Anticoagulation   Diagnosis and orders addressed:  1. Longstanding persistent atrial fibrillation (HCC) - CoaguChek XS/INR Waived  2. Cerebrovascular accident (CVA) due to occlusion of left middle cerebral artery ([place])  3. Encounter for monitoring Coumadin therapy     Description   INR (goal 2-3)- 2.6 (perfect)  Continue 5 mg  (1 tab) every day except Monday and Thursday 7.5 mg (1 1/2 tabs).   Recheck in 4 weeks        Evelina Dun, FNP

## 2019-11-13 NOTE — Telephone Encounter (Signed)
Patient aware treated at time of biopsy

## 2019-11-13 NOTE — Patient Instructions (Signed)
Description   INR (goal 2-3)- 2.6 (perfect)  Continue 5 mg  (1 tab) every day except Monday and Thursday 7.5 mg (1 1/2 tabs).   Recheck in 4 weeks

## 2019-11-14 DIAGNOSIS — M79672 Pain in left foot: Secondary | ICD-10-CM | POA: Diagnosis not present

## 2019-11-14 DIAGNOSIS — L110 Acquired keratosis follicularis: Secondary | ICD-10-CM | POA: Diagnosis not present

## 2019-11-14 DIAGNOSIS — I739 Peripheral vascular disease, unspecified: Secondary | ICD-10-CM | POA: Diagnosis not present

## 2019-11-14 DIAGNOSIS — M79671 Pain in right foot: Secondary | ICD-10-CM | POA: Diagnosis not present

## 2019-12-01 NOTE — Progress Notes (Signed)
Follow-Up Visit   Subjective  Dominique Farley is a 86 y.o. female who presents for the following: Skin Problem (check spots on both legs and some cause pain--also check face).   The following portions of the chart were reviewed this encounter and updated as appropriate: Tobacco  Allergies  Meds  Problems  Med Hx  Surg Hx  Fam Hx      Objective  Well appearing patient in no apparent distress; mood and affect are within normal limits.  A focused examination was performed including lower extremities, including the legs, feet, toes, and toenails,  Upper extremities and face. Relevant physical exam findings are noted in the Assessment and Plan.  Objective  Head - Anterior (Face): Erythematous patches with gritty scale.  Objective  face, arms and legs:  No atypical nevi   Objective  Right Lower Leg - Anterior: Hyperkeratotic scale with pink base      Objective  Left Lower Leg - Posterior: Hyperkeratotic scale with pink base       Assessment & Plan  AK (actinic keratosis) Head - Anterior (Face)  fluorouracil (EFUDEX) 5 % cream - Head - Anterior (Face)  Screening exam for skin cancer face, arms and legs  Carcinoma in situ of skin of lower extremity, including hip, unspecified laterality (2) Right Lower Leg - Anterior  mupirocin ointment (BACTROBAN) 2 %  Skin / nail biopsy Type of biopsy: tangential   Informed consent: discussed and consent obtained   Timeout: patient name, date of birth, surgical site, and procedure verified   Procedure prep:  Patient was prepped and draped in usual sterile fashion (Non sterile) Prep type:  Chlorhexidine Anesthesia: the lesion was anesthetized in a standard fashion   Anesthetic:  1% lidocaine w/ epinephrine 1-100,000 local infiltration Instrument used: flexible razor blade   Outcome: patient tolerated procedure well   Post-procedure details: wound care instructions given    Destruction of lesion  Destruction method:  electrodesiccation and curettage   Informed consent: discussed and consent obtained   Timeout:  patient name, date of birth, surgical site, and procedure verified Anesthesia: the lesion was anesthetized in a standard fashion   Anesthetic:  1% lidocaine w/ epinephrine 1-100,000 local infiltration Curettage performed in three different directions: Yes   Electrodesiccation performed over the curetted area: Yes   Curettage cycles:  3 Margin per side (cm):  0.1 Final wound size (cm):  1 Hemostasis achieved with:  aluminum chloride Outcome: patient tolerated procedure well with no complications   Post-procedure details: wound care instructions given    Specimen 1 - Surgical pathology Differential Diagnosis: scc vs bcc Check Margins: No Inoculated with 81fu  Left Lower Leg - Posterior  mupirocin ointment (BACTROBAN) 2 %  Skin / nail biopsy Type of biopsy: tangential   Informed consent: discussed and consent obtained   Timeout: patient name, date of birth, surgical site, and procedure verified   Procedure prep:  Patient was prepped and draped in usual sterile fashion (Non sterile) Prep type:  Chlorhexidine Anesthesia: the lesion was anesthetized in a standard fashion   Anesthetic:  1% lidocaine w/ epinephrine 1-100,000 local infiltration Instrument used: flexible razor blade   Outcome: patient tolerated procedure well   Post-procedure details: wound care instructions given    Destruction of lesion Complexity: simple   Destruction method: electrodesiccation and curettage   Informed consent: discussed and consent obtained   Timeout:  patient name, date of birth, surgical site, and procedure verified Anesthesia: the lesion was anesthetized in a  standard fashion   Anesthetic:  1% lidocaine w/ epinephrine 1-100,000 local infiltration Curettage performed in three different directions: Yes   Electrodesiccation performed over the curetted area: Yes   Curettage cycles:  3 Lesion length  (cm):  0 Lesion width (cm):  2 Margin per side (cm):  0 Final wound size (cm):  2 Hemostasis achieved with:  ferric subsulfate Outcome: patient tolerated procedure well with no complications   Additional details:  Wound innoculated with 5 fluorouracil solution.  Specimen 2 - Surgical pathology Differential Diagnosis: scc vs bcc Check Margins: No Ed&c    I, Jackson Coffield, PA-C, have reviewed all documentation's for this visit.  The documentation on 12/01/19 for the exam, diagnosis, procedures and orders are all accurate and complete.

## 2019-12-26 ENCOUNTER — Ambulatory Visit (INDEPENDENT_AMBULATORY_CARE_PROVIDER_SITE_OTHER): Payer: Medicare Other | Admitting: Family Medicine

## 2019-12-26 ENCOUNTER — Encounter: Payer: Self-pay | Admitting: Family Medicine

## 2019-12-26 ENCOUNTER — Other Ambulatory Visit: Payer: Self-pay

## 2019-12-26 ENCOUNTER — Ambulatory Visit: Payer: Medicare Other | Admitting: Cardiovascular Disease

## 2019-12-26 VITALS — BP 134/82 | HR 66 | Temp 97.4°F | Ht 60.0 in | Wt 149.0 lb

## 2019-12-26 DIAGNOSIS — G250 Essential tremor: Secondary | ICD-10-CM

## 2019-12-26 DIAGNOSIS — Z7901 Long term (current) use of anticoagulants: Secondary | ICD-10-CM

## 2019-12-26 DIAGNOSIS — I4811 Longstanding persistent atrial fibrillation: Secondary | ICD-10-CM | POA: Diagnosis not present

## 2019-12-26 DIAGNOSIS — I6930 Unspecified sequelae of cerebral infarction: Secondary | ICD-10-CM

## 2019-12-26 DIAGNOSIS — D692 Other nonthrombocytopenic purpura: Secondary | ICD-10-CM

## 2019-12-26 DIAGNOSIS — I63512 Cerebral infarction due to unspecified occlusion or stenosis of left middle cerebral artery: Secondary | ICD-10-CM

## 2019-12-26 LAB — COAGUCHEK XS/INR WAIVED
INR: 2.5 — ABNORMAL HIGH (ref 0.9–1.1)
Prothrombin Time: 30.3 s

## 2019-12-26 LAB — POCT INR: INR: 2.5 (ref 2–3)

## 2019-12-26 NOTE — Progress Notes (Signed)
Subjective: CC: INR PCP: [name], DO QTM:AUQJ Dominique Farley is a 86 y.o. female who is accompanied today by her daughter.  She is presenting to clinic today for:  1.  Atrial fibrillation/history of CVA Patient on chronic anticoagulation due to atrial fibrillation.  She is compliant with her medications.  Currently taking warfarin 7.5 mg Mondays and Thursdays with 5 mg daily all other days.  Denies any hematochezia, melena, vaginal bleeding or epistaxis.  She has easy bruising of the hands.  She does admit that she has difficulty writing the prescriptions that she needs refills on down for her daughters to get refilled for her due to her tremor.  Sometimes this causes her to be out of her medicines, particularly her psoriasis cream.  ROS: Per HPI  Allergies  Allergen Reactions  . Codeine Other (See Comments)  . Influenza Vaccines   . Sulfa Antibiotics Hives   Past Medical History:  Diagnosis Date  . Atrial fibrillation ([place])   . BCC (basal cell carcinoma of skin) 11/14/2004   Right Mid Cheek(Cx3,5FU)  . BCC (basal cell carcinoma of skin) 04/16/2011   Right Crease(tx p bx)  . BCC (basal cell carcinoma of skin) 06/16/2016   Left Nare(tx p bx)  . BCC (basal cell carcinoma of skin) Atypical Basaloid 09/29/2018   Left Side Nose(tx p bx)  . Hypertension   . Osteoporosis   . SCC (squamous cell carcinoma) 09/29/2018   Left Jawline(tx p bx)  . 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   Right Jawline(Cx3,5FU) and Left Upper Cheek(Cx3,5FU)  . 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   Left Jawline(tx p bx), Nose(tx p bx), Right Cheek(tx p bx), and Right Forearm(tx p bx)  . 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   Right Temple(Cx3,5FU) and Left Outer Eye(Cx3,5FU)  . 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   Right Forearm(Cx3,5FU)  . Squamous cell carcinoma in situ (SCCIS) 03/13/2005   Left Side of Nose(Cx3,5FU)  . Squamous cell carcinoma in  situ (SCCIS) 06/04/2005   Inner Right Cheek/Eye(Tx p Bx)  . Squamous cell carcinoma in situ (SCCIS) 02/12/2010   Right Cheek(tx p bx)  . 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 Stroke ([place])   . Tremor     Current Outpatient Medications:  .  amLODipine (NORVASC) 5 MG tablet, TAKE 1 TABLET BY MOUTH EVERY DAY, Disp: 90 tablet, Rfl: 0 .  aspirin EC 81 MG tablet, Take 81 mg by mouth daily. Every other day, Disp: , Rfl:  .  atorvastatin (LIPITOR) 40 MG tablet, TAKE 1 TABLET BY MOUTH AT BEDTIME, Disp: 90 tablet, Rfl: 0 .  clobetasol cream (TEMOVATE) 0.05 %, Apply to affected areas twice daily until psoriatic lesion resolved. AVOID face., Disp: 60 g, Rfl: 2 .  fluorouracil (EFUDEX) 5 % cream, Apply topically at bedtime., Disp: 40 g, Rfl: 1 .  lisinopril (ZESTRIL) 20 MG tablet, TAKE 1 TABLET BY MOUTH EVERY DAY, Disp: 90 tablet, Rfl: 0 .  mupirocin ointment (BACTROBAN) 2 %, Apply 1 application topically 2 (two) times daily., Disp: 22 g, Rfl: 2 .  polyethylene glycol (MIRALAX / GLYCOLAX) 17 g packet, Take 17 g by mouth daily., Disp: , Rfl:  .  primidone (MYSOLINE) 50 MG tablet, TAKE  1 TABLET BY MOUTH THREE TIMES DAILY AS DIRECTED, Disp: 270 tablet, Rfl: 4 .  propranolol (INDERAL) 60 MG tablet, Take 0.5 tablets (30 mg total) by mouth 2 (two) times daily. For tremors, Disp: 30 tablet, Rfl: 11 .  warfarin (COUMADIN) 5 MG tablet, TAKE 1-1.5 TABLETS BY MOUTH DAILY AS DIRECTED, Disp: 120 tablet, Rfl: 2 Social History   Socioeconomic History  . Marital status: Widowed    Spouse name: Not on file  . Number of children: 3  . Years of education: Not on file  . Highest education level: Not on file  Occupational History  . Occupation: Retired  Tobacco Use  . Smoking status: Never Smoker  . Smokeless tobacco: Never Used   Vaping Use  . Vaping Use: Never used  Substance and Sexual Activity  . Alcohol use: Never  . Drug use: Never  . Sexual activity: Not Currently  Other Topics Concern  . Not on file  Social History Narrative  . Not on file   Social Determinants of Health   Financial Resource Strain: Not on file  Food Insecurity: Not on file  Transportation Needs: Not on file  Physical Activity: Not on file  Stress: Not on file  Social Connections: Not on file  Intimate Partner Violence: Not on file   Family History  Problem Relation Age of Onset  . Dementia Mother   . Stroke Father   . Heart disease Father   . Diabetes Sister   . Stroke Son     Objective: Office vital signs reviewed. BP 134/82   Pulse 66   Temp (!) 97.4 F (36.3 C) (Temporal)   Ht 5' (1.524 m)   Wt 149 lb (67.6 kg)   SpO2 98%   BMI 29.10 kg/m   Physical Examination:  General: Awake, alert, well nourished, No acute distress HEENT: Normal, sclera white Cardio: irregular with bradycardia, S1S2 heard, no murmurs appreciated Pulm: clear to auscultation bilaterally, no wheezes, rhonchi or rales; normal work of breathing on room air Extremities: warm, well perfused, No edema, cyanosis or clubbing; +2 pulses bilaterally Skin: Senile purpura noted.  She has some psoriatic changes noted along the dorsums of bilateral hands.  Assessment/ Plan: 86 y.o. female   Chronic anticoagulation - Plan: CoaguChek XS/INR Waived, POCT INR  Longstanding persistent atrial fibrillation ([place]) - Plan: POCT INR  Cerebrovascular accident (CVA) due to occlusion of left middle cerebral artery ([place]) - Plan: POCT INR  Purpura senilis (HCC)  Essential tremor  INR is therapeutic at 2.5 today.  Continue 7.5 mg of Coumadin Mondays and Thursdays with 5 mg daily all other days.  May return in 6 weeks, sooner if needed  Senile purpura likely secondary to chronic anticoagulation as above  Tremor has been limiting her writing so I suggested  that she start using a basket to indicate when she needs more refills on something.  Orders Placed This Encounter  Procedures  . CoaguChek XS/INR Waived   No orders of the defined types were placed in this encounter.    [name], DO [place] [phone]

## 2020-01-03 ENCOUNTER — Ambulatory Visit: Payer: Medicare Other | Admitting: Cardiology

## 2020-01-03 ENCOUNTER — Encounter: Payer: Self-pay | Admitting: Cardiology

## 2020-01-03 VITALS — BP 136/76 | HR 45 | Ht 60.0 in | Wt 147.0 lb

## 2020-01-03 DIAGNOSIS — I10 Essential (primary) hypertension: Secondary | ICD-10-CM | POA: Diagnosis not present

## 2020-01-03 DIAGNOSIS — I4891 Unspecified atrial fibrillation: Secondary | ICD-10-CM | POA: Diagnosis not present

## 2020-01-03 NOTE — Progress Notes (Signed)
Clinical Summary Dominique Farley is a 86 y.o.female last seen by Dr Purvis Sheffield, this is our first visit together.   1. Long standing persistent afib - no recent palpitations - no lightheadendess or dizziness.  - doing well on coumadin. Could not use DOAC due to being on primidone.  INR follwoed by pcp    2. History of CVA - has been on ASA 81mg  every other day it appears  3.HTN - has some component of white coat HTN - compliatn with meds   4. Tremor - she is propranolol  Has had covid vaccine x 3, moderna Past Medical History:  Diagnosis Date  . Atrial fibrillation ([facility])   . BCC (basal cell carcinoma of skin) 11/14/2004   Right Mid Cheek(Cx3,5FU)  . BCC (basal cell carcinoma of skin) 04/16/2011   Right Crease(tx p bx)  . BCC (basal cell carcinoma of skin) 06/16/2016   Left Nare(tx p bx)  . BCC (basal cell carcinoma of skin) Atypical Basaloid 09/29/2018   Left Side Nose(tx p bx)  . Hypertension   . Osteoporosis   . SCC (squamous cell carcinoma) 09/29/2018   Left Jawline(tx p bx)  . 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   Right Jawline(Cx3,5FU) and Left Upper Cheek(Cx3,5FU)  . 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   Left Jawline(tx p bx), Nose(tx p bx), Right Cheek(tx p bx), and Right Forearm(tx p bx)  . 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   Right Temple(Cx3,5FU) and Left Outer Eye(Cx3,5FU)  . 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   Right Forearm(Cx3,5FU)  . Squamous cell carcinoma in situ (SCCIS) 03/13/2005   Left Side of Nose(Cx3,5FU)  . Squamous cell carcinoma in situ (SCCIS) 06/04/2005   Inner Right Cheek/Eye(Tx p Bx)  . Squamous cell carcinoma in situ (SCCIS) 02/12/2010   Right Cheek(tx p bx)  . 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   Left Forehead, Left Neck, Right Nose, and Right Hand   . Stroke ([facility])   . Tremor      Allergies  Allergen Reactions  . Codeine Other (See Comments)  . Influenza Vaccines   . Sulfa Antibiotics Hives     Current Outpatient Medications  Medication Sig Dispense Refill  . amLODipine (NORVASC) 5 MG tablet TAKE 1 TABLET BY MOUTH EVERY DAY 90 tablet 0  . aspirin EC 81 MG tablet Take 81 mg by mouth daily. Every other day    . atorvastatin (LIPITOR) 40 MG tablet TAKE 1 TABLET BY MOUTH AT BEDTIME 90 tablet 0  . clobetasol cream (TEMOVATE) 0.05 % Apply to affected areas twice daily until psoriatic lesion resolved. AVOID face. 60 g 2  . fluorouracil (EFUDEX) 5 % cream Apply topically at bedtime. 40 g 1  . lisinopril (ZESTRIL) 20 MG tablet TAKE 1 TABLET BY MOUTH EVERY DAY 90 tablet 0  . mupirocin ointment (BACTROBAN) 2 % Apply 1 application topically 2 (two) times daily. 22 g 2  . polyethylene glycol (MIRALAX / GLYCOLAX) 17 g packet Take 17 g by mouth daily.    . primidone (MYSOLINE) 50 MG tablet TAKE 1 TABLET BY MOUTH THREE TIMES DAILY AS DIRECTED 270 tablet 4  . propranolol (INDERAL) 60 MG tablet Take 0.5 tablets (30 mg total) by mouth 2 (two) times daily. For tremors 30 tablet 11  .  warfarin (COUMADIN) 5 MG tablet TAKE 1-1.5 TABLETS BY MOUTH DAILY AS DIRECTED 120 tablet 2   No current facility-administered medications for this visit.     Past Surgical History:  Procedure Laterality Date  . CHOLECYSTECTOMY    . skin biopsies       Allergies  Allergen Reactions  . Codeine Other (See Comments)  . Influenza Vaccines   . Sulfa Antibiotics Hives      Family History  Problem Relation Age of Onset  . Dementia Mother   . Stroke Father   . Heart disease Father   . Diabetes Sister   . Stroke Son      Social History Ms. Drollinger reports that she has never smoked. She has never used smokeless tobacco. Ms. Chalupa reports no history of alcohol use.   Review of Systems CONSTITUTIONAL: No weight loss, fever, chills, weakness or fatigue.   HEENT: Eyes: No visual loss, blurred vision, double vision or yellow sclerae.No hearing loss, sneezing, congestion, runny nose or sore throat.  SKIN: No rash or itching.  CARDIOVASCULAR: per hpi RESPIRATORY: No shortness of breath, cough or sputum.  GASTROINTESTINAL: No anorexia, nausea, vomiting or diarrhea. No abdominal pain or blood.  GENITOURINARY: No burning on urination, no polyuria NEUROLOGICAL: No headache, dizziness, syncope, paralysis, ataxia, numbness or tingling in the extremities. No change in bowel or bladder control.  MUSCULOSKELETAL: No muscle, back pain, joint pain or stiffness.  LYMPHATICS: No enlarged nodes. No history of splenectomy.  PSYCHIATRIC: No history of depression or anxiety.  ENDOCRINOLOGIC: No reports of sweating, cold or heat intolerance. No polyuria or polydipsia.  Marland Kitchen   Physical Examination Today's Vitals   01/03/20 1431  BP: 136/76  Pulse: (!) 45  SpO2: 95%  Weight: 147 lb (66.7 kg)  Height: 5' (1.524 m)   Body mass index is 28.71 kg/m.  Gen: resting comfortably, no acute distress HEENT: no scleral icterus, pupils equal round and reactive, no palptable cervical adenopathy,  CV: irreg, no m/r/g, no jvd Resp: Clear to auscultation bilaterally GI: abdomen is soft, non-tender, non-distended, normal bowel sounds, no hepatosplenomegaly MSK: extremities are warm, no edema.  Skin: warm, no rash Neuro:  no focal deficits Psych: appropriate affect      Assessment and Plan  1. Afib - no symptoms. EKG afib mid 50s, asymptomatic. SHe is on propranolol for afib and tremors, continue at this time - on coumadion, could not take DOAC due to interaction with primidone  2. HTN - at goal, continue current meds      Arnoldo Lenis, M.D

## 2020-01-03 NOTE — Patient Instructions (Signed)

## 2020-01-03 NOTE — Addendum Note (Signed)
Addended by: Julian Hy T on: 01/03/2020 04:04 PM   Modules accepted: Orders

## 2020-01-15 ENCOUNTER — Other Ambulatory Visit: Payer: Self-pay | Admitting: Family Medicine

## 2020-01-15 DIAGNOSIS — L409 Psoriasis, unspecified: Secondary | ICD-10-CM

## 2020-02-04 ENCOUNTER — Other Ambulatory Visit: Payer: Self-pay | Admitting: Family Medicine

## 2020-02-04 DIAGNOSIS — I63512 Cerebral infarction due to unspecified occlusion or stenosis of left middle cerebral artery: Secondary | ICD-10-CM

## 2020-02-04 DIAGNOSIS — I10 Essential (primary) hypertension: Secondary | ICD-10-CM

## 2020-02-06 ENCOUNTER — Ambulatory Visit (INDEPENDENT_AMBULATORY_CARE_PROVIDER_SITE_OTHER): Payer: Medicare Other | Admitting: Family Medicine

## 2020-02-06 ENCOUNTER — Encounter: Payer: Self-pay | Admitting: Family Medicine

## 2020-02-06 ENCOUNTER — Other Ambulatory Visit: Payer: Self-pay

## 2020-02-06 VITALS — BP 143/65 | HR 62 | Temp 96.3°F | Resp 20 | Ht 60.0 in | Wt 145.0 lb

## 2020-02-06 DIAGNOSIS — S51011A Laceration without foreign body of right elbow, initial encounter: Secondary | ICD-10-CM

## 2020-02-06 DIAGNOSIS — G250 Essential tremor: Secondary | ICD-10-CM

## 2020-02-06 DIAGNOSIS — Z7901 Long term (current) use of anticoagulants: Secondary | ICD-10-CM

## 2020-02-06 DIAGNOSIS — I4811 Longstanding persistent atrial fibrillation: Secondary | ICD-10-CM | POA: Diagnosis not present

## 2020-02-06 LAB — HEMOGLOBIN, FINGERSTICK: Hemoglobin: 11.8 g/dL (ref 11.1–15.9)

## 2020-02-06 LAB — COAGUCHEK XS/INR WAIVED
INR: 2.6 — ABNORMAL HIGH (ref 0.9–1.1)
Prothrombin Time: 31.7 s
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
## 2020-02-07 ENCOUNTER — Telehealth: Payer: Self-pay | Admitting: Family Medicine

## 2020-02-07 DIAGNOSIS — G250 Essential tremor: Secondary | ICD-10-CM

## 2020-02-07 LAB — CMP14+EGFR
ALT: 20 IU/L (ref 0–32)
AST: 24 IU/L (ref 0–40)
Albumin/Globulin Ratio: 1.2 (ref 1.2–2.2)
Albumin: 3.5 g/dL (ref 3.5–4.6)
Alkaline Phosphatase: 116 IU/L (ref 44–121)
BUN/Creatinine Ratio: 14 (ref 12–28)
BUN: 10 mg/dL (ref 10–36)
Bilirubin Total: 0.3 mg/dL (ref 0.0–1.2)
CO2: 25 mmol/L (ref 20–29)
Calcium: 8.8 mg/dL (ref 8.7–10.3)
Chloride: 101 mmol/L (ref 96–106)
Creatinine, Ser: 0.74 mg/dL (ref 0.57–1.00)
GFR calc Af Amer: 82 mL/min/{1.73_m2} (ref >59)
GFR calc non Af Amer: 71 mL/min/{1.73_m2} (ref >59)
Globulin, Total: 2.9 g/dL (ref 1.5–4.5)
Glucose: 122 mg/dL — ABNORMAL HIGH (ref 65–99)
Potassium: 4.2 mmol/L (ref 3.5–5.2)
Sodium: 137 mmol/L (ref 134–144)
Total Protein: 6.4 g/dL (ref 6.0–8.5)

## 2020-02-07 MED ORDER — PRIMIDONE 50 MG PO TABS: 75.0000 mg | ORAL_TABLET | Freq: Three times a day (TID) | ORAL | 0 refills | Status: DC

## 2020-02-07 NOTE — Telephone Encounter (Signed)
Dr Leonie Man ok'd increasing Primidone to 75mg  TID as tolerated for increased tremor activity.  LVM on Dominique Farley's mobile and told Ms Gardenhire on separate number as well.  Med sent.  I increased her to 75 mg 3 times daily.  This will be 1 & 1/2 tablets of her current 50 mg tablets 3 times daily.  She still on an extremely low dose but do caution that there is a potential implication on her warfarin.  For this reason I would like to see her more closely in the office for recheck Coumadin in the next week or so.  Okay to see other provider if I am unavailable.  She will follow-up with neurology if has ongoing needs.  Meds ordered this encounter  Medications  . primidone (MYSOLINE) 50 MG tablet    Sig: Take 1.5 tablets (75 mg total) by mouth 3 (three) times daily.    Dispense:  405 tablet    Refill:  0

## 2020-02-07 NOTE — Telephone Encounter (Signed)
LMTCB

## 2020-02-08 DIAGNOSIS — M79672 Pain in left foot: Secondary | ICD-10-CM | POA: Diagnosis not present

## 2020-02-08 DIAGNOSIS — L110 Acquired keratosis follicularis: Secondary | ICD-10-CM | POA: Diagnosis not present

## 2020-02-08 DIAGNOSIS — I739 Peripheral vascular disease, unspecified: Secondary | ICD-10-CM | POA: Diagnosis not present

## 2020-02-08 DIAGNOSIS — M79671 Pain in right foot: Secondary | ICD-10-CM | POA: Diagnosis not present

## 2020-02-13 ENCOUNTER — Other Ambulatory Visit: Payer: Self-pay | Admitting: Family Medicine

## 2020-02-13 DIAGNOSIS — I63512 Cerebral infarction due to unspecified occlusion or stenosis of left middle cerebral artery: Secondary | ICD-10-CM

## 2020-02-13 DIAGNOSIS — I10 Essential (primary) hypertension: Secondary | ICD-10-CM

## 2020-03-20 ENCOUNTER — Other Ambulatory Visit: Payer: Self-pay

## 2020-03-20 ENCOUNTER — Encounter: Payer: Self-pay | Admitting: Family Medicine

## 2020-03-20 ENCOUNTER — Ambulatory Visit (INDEPENDENT_AMBULATORY_CARE_PROVIDER_SITE_OTHER): Payer: Medicare Other | Admitting: Family Medicine

## 2020-03-20 VITALS — BP 120/66 | HR 70 | Temp 96.9°F | Ht 60.0 in | Wt 147.0 lb

## 2020-03-20 DIAGNOSIS — S80921A Unspecified superficial injury of right lower leg, initial encounter: Secondary | ICD-10-CM | POA: Diagnosis not present

## 2020-03-20 DIAGNOSIS — Z7901 Long term (current) use of anticoagulants: Secondary | ICD-10-CM

## 2020-03-20 DIAGNOSIS — G250 Essential tremor: Secondary | ICD-10-CM | POA: Diagnosis not present

## 2020-03-20 DIAGNOSIS — Z0001 Encounter for general adult medical examination with abnormal findings: Secondary | ICD-10-CM | POA: Diagnosis not present

## 2020-03-20 DIAGNOSIS — L089 Local infection of the skin and subcutaneous tissue, unspecified: Secondary | ICD-10-CM | POA: Diagnosis not present

## 2020-03-20 DIAGNOSIS — Z0000 Encounter for general adult medical examination without abnormal findings: Secondary | ICD-10-CM

## 2020-03-20 DIAGNOSIS — I4811 Longstanding persistent atrial fibrillation: Secondary | ICD-10-CM

## 2020-03-20 DIAGNOSIS — T148XXA Other injury of unspecified body region, initial encounter: Secondary | ICD-10-CM

## 2020-03-20 DIAGNOSIS — Z8673 Personal history of transient ischemic attack (TIA), and cerebral infarction without residual deficits: Secondary | ICD-10-CM

## 2020-03-20 LAB — COAGUCHEK XS/INR WAIVED
INR: 2.1 — ABNORMAL HIGH (ref 0.9–1.1)
Prothrombin Time: 25.5 s

## 2020-03-20 LAB — POCT INR: INR: 2.1 (ref 2–3)

## 2020-03-20 MED ORDER — CEPHALEXIN 500 MG PO CAPS: 500.0000 mg | ORAL_CAPSULE | Freq: Four times a day (QID) | ORAL | 0 refills | Status: AC

## 2020-03-20 NOTE — Patient Instructions (Signed)
  Skin Tear A skin tear is a wound in which the top layers of skin peel off. This is a common problem for older people. It can also be a problem for people who take certain medicines for too long. To repair the skin, your doctor may use:  Tape.  Skin tape (adhesive) strips. A bandage (dressing) may also be placed over the tape or skin tape strips. Follow these instructions at home: Keep your wound clean  Clean the wound as told by your doctor. You may be told to keep the wound dry for the first few days. If you are told to clean the wound: ? Wash the wound with mild soap and water, a wound cleanser, or a salt-water (saline) solution. ? If you use soap, rinse the wound with water to get all the soap off. ? Do not rub the wound dry. Pat the wound gently with a clean towel or let it air-dry.  Change any bandage as told by your doctor. This includes changing the bandage if it gets wet, gets dirty, or starts to smell bad. To change your bandage: ? Wash your hands with soap and water for at least 20 seconds before and after you change your bandage. If you cannot use soap and water, use hand sanitizer. ? Leave tape or skin tape strips alone unless you are told to take them off. You may trim the edges of the tape strips if they curl up. Watch for signs of infection Check your wound every day for signs of infection. Check for:  Redness, swelling, or pain.  More fluid or blood.  Warmth.  Pus or a bad smell.   Protect your wound  Do not scratch or pick at the wound.  Protect the injured area until it has healed. Medicines  Take or apply over-the-counter and prescription medicines only as told by your doctor.  If you were prescribed an antibiotic medicine, take or apply it as told by your doctor. Do not stop using it even if your condition gets better. General instructions  Keep the bandage dry.  Do not take baths, swim, use a hot tub, or do anything that puts your wound underwater.  Ask your doctor about taking showers or sponge baths.  Keep all follow-up visits.   Contact a doctor if:  You have any of these signs of infection in your wound: ? Redness, swelling, or pain. ? More fluid or blood. ? Warmth. ? Pus or a bad smell. Get help right away if:  You have a red streak that goes away from the skin tear.  You have a fever and chills, and your symptoms get worse all of a sudden. Summary  A skin tear is a wound in which the top layers of skin peel off.  To repair the skin, your doctor may use tape or skin tape strips.  Change any bandage as told by your doctor.  Take or apply over-the-counter and prescription medicines only as told by your doctor.  Contact a doctor if you have signs of infection. This information is not intended to replace advice given to you by your health care provider. Make sure you discuss any questions you have with your health care provider. Document Revised: 04/05/2019 Document Reviewed: 04/05/2019 Elsevier Patient Education  [place].

## 2020-03-20 NOTE — Progress Notes (Signed)
Dominique Farley is a 87 y.o. female presents to office today for annual physical exam examination.    Concerns today include: 1.  Coumadin check Patient reports compliance with her Coumadin 1/2 tablets 2 days/week and 1 tablet daily all other days.  No reports of hematochezia, melena, heart palpitations  2.  Skin tear Patient sustained a skin tear to the right anterior lower leg.  She has been applying Vaseline initially and then transitioned over to triple antibiotic ointment and topical prescription Bactroban in efforts to improve the lesion.  It does finally seem like it is getting smaller but it has quite a bit of redness around it.  3.  Tremor She has noticed improvement in her tremor, specifically in her hands.  She is currently on 75 mg of primidone 3 times daily.  Denies any depressive symptoms or falls.  No excessive daytime sedation.  Occupation: Retired  Diet: Well balanced, Exercise: No structured Last eye exam: Up-to-date Last dental exam: Up-to-date Refills needed today: None Immunizations needed:  Immunization History  Administered Date(s) Administered  . Moderna Sars-Covid-2 Vaccination 02/13/2019, 03/06/2019, 11/21/2019  . Td 09/04/2019     Past Medical History:  Diagnosis Date  . Atrial fibrillation ([place])   . BCC (basal cell carcinoma of skin) 11/14/2004   Right Mid Cheek(Cx3,5FU)  . BCC (basal cell carcinoma of skin) 04/16/2011   Right Crease(tx p bx)  . BCC (basal cell carcinoma of skin) 06/16/2016   Left Nare(tx p bx)  . BCC (basal cell carcinoma of skin) Atypical Basaloid 09/29/2018   Left Side Nose(tx p bx)  . Hypertension   . Osteoporosis   . SCC (squamous cell carcinoma) 09/29/2018   Left Jawline(tx p bx)  . 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   Right Jawline(Cx3,5FU) and Left Upper Cheek(Cx3,5FU)  . 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   Left Jawline(tx p bx), Nose(tx p bx), Right Cheek(tx p bx), and Right Forearm(tx p bx)  . SCC  (squamous cell carcinoma)-Well Diff x 2 05/16/2004   Right Temple(Cx3,5FU) and Left Outer Eye(Cx3,5FU)  . 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   Right Forearm(Cx3,5FU)  . Squamous cell carcinoma in situ (SCCIS) 03/13/2005   Left Side of Nose(Cx3,5FU)  . Squamous cell carcinoma in situ (SCCIS) 06/04/2005   Inner Right Cheek/Eye(Tx p Bx)  . Squamous cell carcinoma in situ (SCCIS) 02/12/2010   Right Cheek(tx p bx)  . 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 Stroke ([place])   . Tremor    Social History   Socioeconomic History  . Marital status: Widowed    Spouse name: Not on file  . Number of children: 3  . Years of education: Not on file  . Highest education level: Not on file  Occupational History  . Occupation: Retired  Tobacco Use  . Smoking status: Never Smoker  . Smokeless tobacco: Never Used  Vaping Use  . Vaping Use: Never used  Substance and Sexual Activity  . Alcohol use: Never  . Drug use: Never  . Sexual activity: Not Currently  Other Topics Concern  . Not on file  Social History Narrative  . Not on file   Social Determinants of Health   Financial Resource Strain: Not on file  Food Insecurity:  Not on file  Transportation Needs: Not on file  Physical Activity: Not on file  Stress: Not on file  Social Connections: Not on file  Intimate Partner Violence: Not on file   Past Surgical History:  Procedure Laterality Date  . CHOLECYSTECTOMY    . skin biopsies     Family History  Problem Relation Age of Onset  . Dementia Mother   . Stroke Father   . Heart disease Father   . Diabetes Sister   . Stroke Son     Current Outpatient Medications:  .  amLODipine (NORVASC) 5 MG tablet, TAKE 1 TABLET BY MOUTH EVERY DAY, Disp: 30 tablet, Rfl: 1 .  aspirin EC 81 MG tablet,  Take 81 mg by mouth daily. Every other day, Disp: , Rfl:  .  atorvastatin (LIPITOR) 40 MG tablet, TAKE 1 TABLET BY MOUTH AT BEDTIME, Disp: 30 tablet, Rfl: 1 .  clobetasol cream (TEMOVATE) 0.05 %, APPLY TO AFFECTED AREAS TWICE DAILY UNTIL PSORIATIC LESION RESOLVED, Disp: 60 g, Rfl: 2 .  fluorouracil (EFUDEX) 5 % cream, Apply topically at bedtime., Disp: 40 g, Rfl: 1 .  lisinopril (ZESTRIL) 20 MG tablet, TAKE 1 TABLET BY MOUTH EVERY DAY, Disp: 30 tablet, Rfl: 1 .  mupirocin ointment (BACTROBAN) 2 %, Apply 1 application topically 2 (two) times daily., Disp: 22 g, Rfl: 2 .  polyethylene glycol (MIRALAX / GLYCOLAX) 17 g packet, Take 17 g by mouth daily., Disp: , Rfl:  .  primidone (MYSOLINE) 50 MG tablet, Take 1.5 tablets (75 mg total) by mouth 3 (three) times daily., Disp: 405 tablet, Rfl: 0 .  propranolol (INDERAL) 60 MG tablet, Take 0.5 tablets (30 mg total) by mouth 2 (two) times daily. For tremors, Disp: 30 tablet, Rfl: 11 .  warfarin (COUMADIN) 5 MG tablet, TAKE 1-1.5 TABLETS BY MOUTH DAILY AS DIRECTED, Disp: 120 tablet, Rfl: 2  Allergies  Allergen Reactions  . Codeine Other (See Comments)  . Influenza Vaccines   . Sulfa Antibiotics Hives     ROS: Review of Systems Pertinent items noted in HPI and remainder of comprehensive ROS otherwise negative.    Physical exam BP 120/66   Pulse 70   Temp (!) 96.9 F (36.1 C) (Temporal)   Ht 5' (1.524 m)   Wt 147 lb (66.7 kg)   SpO2 96%   BMI 28.71 kg/m  General appearance: alert, cooperative, appears stated age and no distress Head: Normocephalic, without obvious abnormality, atraumatic Eyes: negative findings: lids and lashes normal, conjunctivae and sclerae normal, corneas clear, pupils equal, round, reactive to light and accomodation and Evidence of previous cataract surgery noted Ears: Right external auditory canal with cerumen noted.  Left external auditory canal with creamy discharge noted. Nose: Nares normal. Septum midline. Mucosa  normal. No drainage or sinus tenderness. Throat: lips, mucosa, and tongue normal; teeth and gums normal Neck: no adenopathy, no carotid bruit, supple, symmetrical, trachea midline and thyroid not enlarged, symmetric, no tenderness/mass/nodules Back: symmetric, no curvature. ROM normal. No CVA tenderness. Lungs: clear to auscultation bilaterally Heart: regular rate and rhythm, S1, S2 normal, no murmur, click, rub or gallop Abdomen: soft, non-tender; bowel sounds normal; no masses,  no organomegaly Extremities: No edema.  Multiple senile purpura noted.  She has a healing skin tear noted along the right anterior lower leg.  There is moderate warmth and the surrounding erythema.  No purulence. Pulses: 2+ and symmetric Skin: As above Lymph nodes: Cervical, supraclavicular, and axillary nodes normal. Neurologic: Flattening of the nasolabial  fold on the right.  She has decreased range of motion and grip strength in the right hand.  She ambulates with use of cane. Psych: Mood stable   Assessment/ Plan: Dominique Farley here for annual physical exam.   Annual physical exam - Plan: POCT INR  History of CVA in adulthood - Plan: POCT INR  Essential tremor - Plan: POCT INR  Longstanding persistent atrial fibrillation ([place]) - Plan: CoaguChek XS/INR Waived, POCT INR  Chronic anticoagulation - Plan: CoaguChek XS/INR Waived, POCT INR  Infected skin tear - Plan: cephALEXin (KEFLEX) 500 MG capsule, POCT INR   Residual deficits noted on the right side of her body since CVA She seems to be getting around okay with use of her cane, when she is actually using it  Tremor does seem to be better in the hands today.  Continue primidone.  No adverse side effects thus far  Her INR is therapeutic.  May continue Coumadin 1.5 tablets 2 days/week with 1 tablet daily all other days.  Follow-up in 6 weeks with regards to that  Lesion on lower leg seems to be mildly infected and therefore Keflex has been prescribed.   Home care instructions reviewed.  She will contact me if symptoms are worsening or not improving   Bentley Haralson M. Lajuana Ripple, DO

## 2020-04-08 DIAGNOSIS — H6123 Impacted cerumen, bilateral: Secondary | ICD-10-CM | POA: Diagnosis not present

## 2020-04-16 ENCOUNTER — Telehealth: Payer: Self-pay

## 2020-04-16 ENCOUNTER — Other Ambulatory Visit: Payer: Self-pay | Admitting: Family Medicine

## 2020-04-16 DIAGNOSIS — L409 Psoriasis, unspecified: Secondary | ICD-10-CM

## 2020-04-16 MED ORDER — CLOBETASOL PROPIONATE 0.05 % EX CREA: TOPICAL_CREAM | CUTANEOUS | 2 refills | Status: DC

## 2020-04-16 NOTE — Telephone Encounter (Signed)
Can she have a larger QTY?

## 2020-04-16 NOTE — Telephone Encounter (Signed)
Daughter aware.

## 2020-04-16 NOTE — Telephone Encounter (Signed)
The 60g is the largest size.  I have sent in 2 tubes in hopes that it will help but recommend that she follow up with dermatology if she is having to use it every day.  She may need something different.

## 2020-05-01 ENCOUNTER — Ambulatory Visit (INDEPENDENT_AMBULATORY_CARE_PROVIDER_SITE_OTHER): Payer: Medicare Other | Admitting: Family Medicine

## 2020-05-01 ENCOUNTER — Other Ambulatory Visit: Payer: Self-pay

## 2020-05-01 ENCOUNTER — Encounter: Payer: Self-pay | Admitting: Family Medicine

## 2020-05-01 VITALS — BP 116/68 | HR 61 | Temp 98.4°F | Ht 60.0 in | Wt 142.2 lb

## 2020-05-01 DIAGNOSIS — Z7901 Long term (current) use of anticoagulants: Secondary | ICD-10-CM

## 2020-05-01 DIAGNOSIS — I4811 Longstanding persistent atrial fibrillation: Secondary | ICD-10-CM

## 2020-05-01 LAB — COAGUCHEK XS/INR WAIVED
INR: 2.1 — ABNORMAL HIGH (ref 0.9–1.1)
Prothrombin Time: 25.3 s

## 2020-05-01 NOTE — Patient Instructions (Signed)
Keep everything the same.  INR perfect.  See you in 6 weeks.

## 2020-05-01 NOTE — Progress Notes (Signed)
Subjective: CC: Chronic atrial fibrillation PCP: Dominique Norlander, DO EYC:XKGY Cwikla is a 87 y.o. female presenting to clinic today for:  1.  Atrial fibrillation INR goal 2-3 Patient continues to take 1-1/2 tablets 2 days/week with 1 tablet daily all other days.  Last INR was at goal.  She has been compliant with her regimen.  No missed doses.  No changes in diet.  She had a really good Easter with her family.  She is accompanied today by her daughter, who is expecting to have hip surgery of her own on the left side.  Patient denies any hematochezia, melena, hematuria or epistaxis.  No heart palpitations.  ROS: Per HPI  Allergies  Allergen Reactions  . Codeine Other (See Comments)  . Influenza Vaccines   . Sulfa Antibiotics Hives   Past Medical History:  Diagnosis Date  . Atrial fibrillation ([place])   . BCC (basal cell carcinoma of skin) 11/14/2004   Right Mid Cheek(Cx3,5FU)  . BCC (basal cell carcinoma of skin) 04/16/2011   Right Crease(tx p bx)  . BCC (basal cell carcinoma of skin) 06/16/2016   Left Nare(tx p bx)  . BCC (basal cell carcinoma of skin) Atypical Basaloid 09/29/2018   Left Side Nose(tx p bx)  . Hypertension   . Osteoporosis   . SCC (squamous cell carcinoma) 09/29/2018   Left Jawline(tx p bx)  . 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   Right Jawline(Cx3,5FU) and Left Upper Cheek(Cx3,5FU)  . 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   Left Jawline(tx p bx), Nose(tx p bx), Right Cheek(tx p bx), and Right Forearm(tx p bx)  . 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   Right Temple(Cx3,5FU) and Left Outer Eye(Cx3,5FU)  . 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   Right Forearm(Cx3,5FU)  . Squamous cell carcinoma in situ (SCCIS) 03/13/2005   Left Side of Nose(Cx3,5FU)  . Squamous cell carcinoma in situ (SCCIS) 06/04/2005   Inner Right Cheek/Eye(Tx p Bx)  . Squamous cell carcinoma in situ (SCCIS) 02/12/2010   Right Cheek(tx p  bx)  . 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 Stroke ([place])   . Tremor     Current Outpatient Medications:  .  amLODipine (NORVASC) 5 MG tablet, TAKE 1 TABLET BY MOUTH EVERY DAY, Disp: 30 tablet, Rfl: 1 .  aspirin EC 81 MG tablet, Take 81 mg by mouth daily. Every other day, Disp: , Rfl:  .  atorvastatin (LIPITOR) 40 MG tablet, TAKE 1 TABLET BY MOUTH AT BEDTIME, Disp: 30 tablet, Rfl: 1 .  clobetasol cream (TEMOVATE) 0.05 %, APPLY TO AFFECTED AREAS TWICE DAILY UNTIL PSORIATIC LESION RESOLVED, Disp: 120 g, Rfl: 2 .  fluorouracil (EFUDEX) 5 % cream, Apply topically at bedtime., Disp: 40 g, Rfl: 1 .  lisinopril (ZESTRIL) 20 MG tablet, TAKE 1 TABLET BY MOUTH EVERY DAY, Disp: 30 tablet, Rfl: 1 .  mupirocin ointment (BACTROBAN) 2 %, Apply 1 application topically 2 (two) times daily., Disp: 22 g, Rfl: 2 .  polyethylene glycol (MIRALAX / GLYCOLAX) 17 g packet, Take 17 g by mouth daily., Disp: , Rfl:  .  primidone (MYSOLINE) 50 MG tablet, Take 1.5 tablets (75 mg total) by mouth 3 (three) times daily., Disp: 405 tablet, Rfl: 0 .  propranolol (INDERAL) 60 MG tablet, Take 0.5 tablets (30 mg total) by mouth 2 (two) times daily. For tremors, Disp: 30 tablet, Rfl: 11 .  warfarin (COUMADIN) 5 MG tablet, TAKE 1-1.5 TABLETS BY MOUTH DAILY AS DIRECTED, Disp: 120 tablet, Rfl: 2 Social History   Socioeconomic History  . Marital status: Widowed    Spouse name: Not on file  . Number of children: 3  . Years of education: Not on file  . Highest education level: Not on file  Occupational History  . Occupation: Retired  Tobacco Use  . Smoking status: Never Smoker  . Smokeless tobacco: Never Used  Vaping Use  . Vaping Use: Never used  Substance and Sexual Activity  . Alcohol use: Never  . Drug use: Never  . Sexual activity: Not  Currently  Other Topics Concern  . Not on file  Social History Narrative  . Not on file   Social Determinants of Health   Financial Resource Strain: Not on file  Food Insecurity: Not on file  Transportation Needs: Not on file  Physical Activity: Not on file  Stress: Not on file  Social Connections: Not on file  Intimate Partner Violence: Not on file   Family History  Problem Relation Age of Onset  . Dementia Mother   . Stroke Father   . Heart disease Father   . Diabetes Sister   . Stroke Son     Objective: Office vital signs reviewed. BP 116/68   Pulse 61   Temp 98.4 F (36.9 C)   Ht 5' (1.524 m)   Wt 142 lb 3.2 oz (64.5 kg)   SpO2 97%   BMI 27.77 kg/m   Physical Examination:  General: Awake, alert, well-appearing, well-groomed elderly female, No acute distress Cardio: Irregularly irregular with bradycardia.  No murmurs. Pulmonary: Clear to auscultation bilaterally.  Normal work of breathing on room air.  No wheezes, rhonchi or rails  Assessment/ Plan: 87 y.o. female   Longstanding persistent atrial fibrillation ([place]) - Plan: CoaguChek XS/INR Waived  Chronic anticoagulation - Plan: CoaguChek XS/INR Waived  INR is therapeutic.  Her rate is controlled.  No red flags signs or symptoms.  Follow-up in 6 weeks, sooner if needed  No orders of the defined types were placed in this encounter.  No orders of the defined types were placed in this encounter.    Dominique Norlander, DO [place] [phone]

## 2020-05-02 DIAGNOSIS — M79672 Pain in left foot: Secondary | ICD-10-CM | POA: Diagnosis not present

## 2020-05-02 DIAGNOSIS — L110 Acquired keratosis follicularis: Secondary | ICD-10-CM | POA: Diagnosis not present

## 2020-05-02 DIAGNOSIS — M79671 Pain in right foot: Secondary | ICD-10-CM | POA: Diagnosis not present

## 2020-05-02 DIAGNOSIS — I739 Peripheral vascular disease, unspecified: Secondary | ICD-10-CM | POA: Diagnosis not present

## 2020-05-05 ENCOUNTER — Other Ambulatory Visit: Payer: Self-pay | Admitting: Family Medicine

## 2020-05-05 DIAGNOSIS — I4811 Longstanding persistent atrial fibrillation: Secondary | ICD-10-CM

## 2020-05-05 DIAGNOSIS — I10 Essential (primary) hypertension: Secondary | ICD-10-CM

## 2020-05-05 DIAGNOSIS — I63512 Cerebral infarction due to unspecified occlusion or stenosis of left middle cerebral artery: Secondary | ICD-10-CM

## 2020-05-14 ENCOUNTER — Other Ambulatory Visit: Payer: Self-pay

## 2020-05-14 DIAGNOSIS — G250 Essential tremor: Secondary | ICD-10-CM

## 2020-05-14 MED ORDER — PRIMIDONE 50 MG PO TABS: 75.0000 mg | ORAL_TABLET | Freq: Three times a day (TID) | ORAL | 0 refills | Status: DC

## 2020-05-24 ENCOUNTER — Other Ambulatory Visit: Payer: Self-pay

## 2020-05-24 ENCOUNTER — Encounter: Payer: Self-pay | Admitting: Family Medicine

## 2020-05-24 ENCOUNTER — Telehealth: Payer: Self-pay

## 2020-05-24 ENCOUNTER — Ambulatory Visit (INDEPENDENT_AMBULATORY_CARE_PROVIDER_SITE_OTHER): Payer: Medicare Other | Admitting: Family Medicine

## 2020-05-24 VITALS — BP 132/71 | HR 68 | Temp 98.3°F | Ht 60.0 in | Wt 143.6 lb

## 2020-05-24 DIAGNOSIS — L03115 Cellulitis of right lower limb: Secondary | ICD-10-CM | POA: Diagnosis not present

## 2020-05-24 MED ORDER — CEPHALEXIN 500 MG PO CAPS: 500.0000 mg | ORAL_CAPSULE | Freq: Four times a day (QID) | ORAL | 0 refills | Status: AC

## 2020-05-24 MED ORDER — CEFTRIAXONE SODIUM 1 G IJ SOLR: 1.0000 g | Freq: Once | INTRAMUSCULAR | Status: DC

## 2020-05-24 NOTE — Progress Notes (Signed)
Subjective: CC: Cellulitis PCP: [name], DO JOI:NOMV Tiggs is a 87 y.o. female presenting to clinic today for:  1.  Cellulitis Patient was seen about 2 months ago for similar.  She was empirically placed on Keflex and her daughter notes that symptoms had resolved.  She has a new lesion on her right lower extremity such that she is having erythema.  She has what appears to be an abrasion on the right posterior leg and her daughter notes that this seems to be worse than it had been.  Patient does report some discomfort but no reports of fevers, chills or vomiting.  No purulence observed.   ROS: Per HPI  Allergies  Allergen Reactions  . Codeine Other (See Comments)  . Influenza Vaccines   . Sulfa Antibiotics Hives   Past Medical History:  Diagnosis Date  . Atrial fibrillation ([place])   . BCC (basal cell carcinoma of skin) 11/14/2004   Right Mid Cheek(Cx3,5FU)  . BCC (basal cell carcinoma of skin) 04/16/2011   Right Crease(tx p bx)  . BCC (basal cell carcinoma of skin) 06/16/2016   Left Nare(tx p bx)  . BCC (basal cell carcinoma of skin) Atypical Basaloid 09/29/2018   Left Side Nose(tx p bx)  . Hypertension   . Osteoporosis   . SCC (squamous cell carcinoma) 09/29/2018   Left Jawline(tx p bx)  . 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   Right Jawline(Cx3,5FU) and Left Upper Cheek(Cx3,5FU)  . 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   Left Jawline(tx p bx), Nose(tx p bx), Right Cheek(tx p bx), and Right Forearm(tx p bx)  . 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   Right Temple(Cx3,5FU) and Left Outer Eye(Cx3,5FU)  . 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   Right Forearm(Cx3,5FU)  . Squamous cell carcinoma in situ (SCCIS) 03/13/2005   Left Side of Nose(Cx3,5FU)  . Squamous cell carcinoma in situ (SCCIS) 06/04/2005   Inner Right Cheek/Eye(Tx p Bx)  . Squamous cell carcinoma in situ (SCCIS) 02/12/2010   Right Cheek(tx p bx)  .  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 Stroke ([place])   . Tremor     Current Outpatient Medications:  .  amLODipine (NORVASC) 5 MG tablet, TAKE 1 TABLET BY MOUTH EVERY DAY, Disp: 30 tablet, Rfl: 1 .  aspirin EC 81 MG tablet, Take 81 mg by mouth daily. Every other day, Disp: , Rfl:  .  atorvastatin (LIPITOR) 40 MG tablet, TAKE 1 TABLET BY MOUTH AT BEDTIME, Disp: 30 tablet, Rfl: 1 .  clobetasol cream (TEMOVATE) 0.05 %, APPLY TO AFFECTED AREAS TWICE DAILY UNTIL PSORIATIC LESION RESOLVED, Disp: 120 g, Rfl: 2 .  fluorouracil (EFUDEX) 5 % cream, Apply topically at bedtime., Disp: 40 g, Rfl: 1 .  lisinopril (ZESTRIL) 20 MG tablet, TAKE 1 TABLET BY MOUTH EVERY DAY, Disp: 30 tablet, Rfl: 1 .  mupirocin ointment (BACTROBAN) 2 %, Apply 1 application topically 2 (two) times daily., Disp: 22 g, Rfl: 2 .  polyethylene glycol (MIRALAX / GLYCOLAX) 17 g packet, Take 17 g by mouth daily., Disp: , Rfl:  .  primidone (MYSOLINE) 50 MG tablet, Take 1.5 tablets (75 mg total) by mouth 3 (three) times daily., Disp: 405 tablet, Rfl: 0 .  propranolol (  INDERAL) 60 MG tablet, Take 0.5 tablets (30 mg total) by mouth 2 (two) times daily. For tremors, Disp: 30 tablet, Rfl: 11 .  warfarin (COUMADIN) 5 MG tablet, TAKE 1-1.5 TABLETS BY MOUTH DAILY AS DIRECTED, Disp: 120 tablet, Rfl: 2  Current Facility-Administered Medications:  .  cefTRIAXone (ROCEPHIN) injection 1 g, 1 g, Intramuscular, Once, [name], DO Social History   Socioeconomic History  . Marital status: Widowed    Spouse name: Not on file  . Number of children: 3  . Years of education: Not on file  . Highest education level: Not on file  Occupational History  . Occupation: Retired  Tobacco Use  . Smoking status: Never Smoker  . Smokeless tobacco: Never Used  Vaping  Use  . Vaping Use: Never used  Substance and Sexual Activity  . Alcohol use: Never  . Drug use: Never  . Sexual activity: Not Currently  Other Topics Concern  . Not on file  Social History Narrative  . Not on file   Social Determinants of Health   Financial Resource Strain: Not on file  Food Insecurity: Not on file  Transportation Needs: Not on file  Physical Activity: Not on file  Stress: Not on file  Social Connections: Not on file  Intimate Partner Violence: Not on file   Family History  Problem Relation Age of Onset  . Dementia Mother   . Stroke Father   . Heart disease Father   . Diabetes Sister   . Stroke Son     Objective: Office vital signs reviewed. BP 132/71   Pulse 68   Temp 98.3 F (36.8 C)   Ht 5' (1.524 m)   Wt 143 lb 9.6 oz (65.1 kg)   SpO2 99%   BMI 28.04 kg/m   Physical Examination:  General: Awake, alert, No acute distress Skin: Right lower extremity with soft tissue swelling, erythema and warmth.  She has what appears to be an abrasion along the posterior right calf with some skin breakdown.  No purulence but there is surrounding erythema and warmth.  Assessment/ Plan: 87 y.o. female   Cellulitis of right lower extremity - Plan: cefTRIAXone (ROCEPHIN) injection 1 g, cephALEXin (KEFLEX) 500 MG capsule  Cellulitis noted.  I have given her dose of Rocephin here in office.  Lesion is nonpurulent suggesting more along a strep/erysipelas presentation.  Keflex prescribed 4 times daily.  Home care instructions reviewed with the patient and her daughter.  Reasons for reevaluation discussed.  Keep appointment with dermatology on Monday.  If needed may need to get reevaluated next Friday.  Otherwise, she will follow-up as needed.  No orders of the defined types were placed in this encounter.  Meds ordered this encounter  Medications  . cefTRIAXone (ROCEPHIN) injection 1 g  . cephALEXin (KEFLEX) 500 MG capsule    Sig: Take 1 capsule (500 mg total) by  mouth 4 (four) times daily for 14 days.    Dispense:  56 capsule    Refill:  [place], DO [place] [phone]

## 2020-05-24 NOTE — Patient Instructions (Addendum)
Rocephin given today.  Start Cephalexin every 6 hours starting with breakfast tomorrow.  If anything worsens, please let me know.  Otherwise, see Dr Denna Haggard on Monday.    Cellulitis, Adult  Cellulitis is a skin infection. The infected area is often warm, red, swollen, and sore. It occurs most often in the arms and lower legs. It is very important to get treated for this condition. What are the causes? This condition is caused by bacteria. The bacteria enter through a break in the skin, such as a cut, burn, insect bite, open sore, or crack. What increases the risk? This condition is more likely to occur in people who:  Have a weak body defense system (immune system).  Have open cuts, burns, bites, or scrapes on the skin.  Are older than 85 years of age.  Have a blood sugar problem (diabetes).  Have a long-lasting (chronic) liver disease (cirrhosis) or kidney disease.  Are very overweight (obese).  Have a skin problem, such as: ? Itchy rash (eczema). ? Slow movement of blood in the veins (venous stasis). ? Fluid buildup below the skin (edema).  Have been treated with high-energy rays (radiation).  Use IV drugs. What are the signs or symptoms? Symptoms of this condition include:  Skin that is: ? Red. ? Streaking. ? Spotting. ? Swollen. ? Sore or painful when you touch it. ? Warm.  A fever.  Chills.  Blisters. How is this diagnosed? This condition is diagnosed based on:  Medical history.  Physical exam.  Blood tests.  Imaging tests. How is this treated? Treatment for this condition may include:  Medicines to treat infections or allergies.  Home care, such as: ? Rest. ? Placing cold or warm cloths (compresses) on the skin.  Hospital care, if the condition is very bad. Follow these instructions at home: Medicines  Take over-the-counter and prescription medicines only as told by your doctor.  If you were prescribed an antibiotic medicine, take it as told  by your doctor. Do not stop taking it even if you start to feel better. General instructions  Drink enough fluid to keep your pee (urine) pale yellow.  Do not touch or rub the infected area.  Raise (elevate) the infected area above the level of your heart while you are sitting or lying down.  Place cold or warm cloths on the area as told by your doctor.  Keep all follow-up visits as told by your doctor. This is important.   Contact a doctor if:  You have a fever.  You do not start to get better after 1-2 days of treatment.  Your bone or joint under the infected area starts to hurt after the skin has healed.  Your infection comes back. This can happen in the same area or another area.  You have a swollen bump in the area.  You have new symptoms.  You feel ill and have muscle aches and pains. Get help right away if:  Your symptoms get worse.  You feel very sleepy.  You throw up (vomit) or have watery poop (diarrhea) for a long time.  You see red streaks coming from the area.  Your red area gets larger.  Your red area turns dark in color. These symptoms may represent a serious problem that is an emergency. Do not wait to see if the symptoms will go away. Get medical help right away. Call your local emergency services (911 in the [place] Do not drive yourself to the hospital. Summary  Cellulitis is a skin infection. The area is often warm, red, swollen, and sore.  This condition is treated with medicines, rest, and cold and warm cloths.  Take all medicines only as told by your doctor.  Tell your doctor if symptoms do not start to get better after 1-2 days of treatment. This information is not intended to replace advice given to you by your health care provider. Make sure you discuss any questions you have with your health care provider. Document Revised: 05/20/2017 Document Reviewed: 05/20/2017 Elsevier Patient Education  [place].

## 2020-05-24 NOTE — Telephone Encounter (Signed)
Pts daughter called stating that pt has finished her antibiotic for the wound she has but says the wound has not healed. Says pt is scheduled to see her dermatologist on Monday but wants to know if a refill can be sent in for the antibiotic. Says pts leg is also really swollen and red today, especially around the wound area.  Please advise and call daughter [name]) [phone]

## 2020-05-27 ENCOUNTER — Other Ambulatory Visit: Payer: Self-pay

## 2020-05-27 ENCOUNTER — Ambulatory Visit: Payer: Medicare Other | Admitting: Dermatology

## 2020-05-27 ENCOUNTER — Encounter: Payer: Self-pay | Admitting: Dermatology

## 2020-05-27 DIAGNOSIS — Z1283 Encounter for screening for malignant neoplasm of skin: Secondary | ICD-10-CM | POA: Diagnosis not present

## 2020-05-27 DIAGNOSIS — I872 Venous insufficiency (chronic) (peripheral): Secondary | ICD-10-CM | POA: Diagnosis not present

## 2020-05-30 ENCOUNTER — Ambulatory Visit (INDEPENDENT_AMBULATORY_CARE_PROVIDER_SITE_OTHER): Payer: Medicare Other | Admitting: Family Medicine

## 2020-05-30 ENCOUNTER — Encounter: Payer: Self-pay | Admitting: Family Medicine

## 2020-05-30 ENCOUNTER — Other Ambulatory Visit: Payer: Self-pay

## 2020-05-30 VITALS — BP 148/77 | HR 61 | Temp 97.6°F | Ht 60.0 in | Wt 143.2 lb

## 2020-05-30 DIAGNOSIS — R600 Localized edema: Secondary | ICD-10-CM

## 2020-05-30 MED ORDER — FUROSEMIDE 20 MG PO TABS: 20.0000 mg | ORAL_TABLET | Freq: Every day | ORAL | 3 refills | Status: DC

## 2020-05-30 NOTE — Progress Notes (Signed)
Subjective:  Patient ID: Dominique Farley, female    DOB: [birth year]  Age: 87 y.o. MRN: [id number]  CC: Edema (Right lower leg swelling, weeping, new dx of skin cancer of same leg)   HPI Dominique Farley presents for swelling in the right leg for 6 days. Dr. Denna Haggard wants to refer her for further care of the skin lesion noted on the calf just distal to the knee ,RLE.  The swelling was sudden onset.  It occurred 6 days ago.  It was accompanied by moderate pain of the right lower extremity from the knee down.  Dominique Farley was seen by Dr. Lajuana Ripple started on antibiotic due to erythema.  That seems to be improving.  Swelling on the other hand has been is getting worse.  Yesterday Dominique Farley suffered a contusion of the same leg at the mid shin region when Dominique Farley ran into a piece of furniture.  When the swelling and pain was first noted Dominique Farley's daughter pulled up her pant leg and noted a large lesion on the posterior lateral calf.  Dominique Farley saw Dr. Denna Haggard for this as a likely skin cancer, possibly a melanoma earlier this week.  The family is to determine what level of care they want to give this and biopsy can be arranged through Dr. Onalee Hua office  Depression screen [facility] 2/9 05/30/2020 05/24/2020 05/01/2020  Decreased Interest 0 0 0  Down, Depressed, Hopeless 0 0 0  PHQ - 2 Score 0 0 0  Altered sleeping - - -  Tired, decreased energy - - -  Change in appetite - - -  Feeling bad or failure about yourself  - - -  Trouble concentrating - - -  Moving slowly or fidgety/restless - - -  Suicidal thoughts - - -  PHQ-9 Score - - -    History Dominique Farley has a past medical history of Atrial fibrillation ([place]), BCC (basal cell carcinoma of skin) (11/14/2004), BCC (basal cell carcinoma of skin) (04/16/2011), BCC (basal cell carcinoma of skin) (06/16/2016), BCC (basal cell carcinoma of skin) Atypical Basaloid (09/29/2018), Hypertension, Osteoporosis, SCC (squamous cell carcinoma) (09/29/2018), SCC (squamous cell carcinoma) x 2 (03/13/2005), SCC  (squamous cell carcinoma) x 4 (06/16/2016), SCC (squamous cell carcinoma)-Well Diff x 2 (05/16/2004), 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   Dominique Farley has a past surgical history that includes Cholecystectomy and skin biopsies.   Her family history includes Dementia in her mother; Diabetes in her sister; Heart disease in her father; Stroke in her father and son.Dominique Farley reports that Dominique Farley has never smoked. Dominique Farley has never used smokeless tobacco. Dominique Farley reports that Dominique Farley does not drink alcohol and does not use drugs.    ROS Review of Systems  Constitutional: Negative for activity change, chills and fever.  HENT: Negative.   Respiratory: Negative for cough and shortness of breath.   Cardiovascular: Positive for leg swelling. Negative for chest pain.  Gastrointestinal: Negative for abdominal pain.  Skin: Positive for wound.  Neurological: Positive for dizziness.    Objective:  BP (!) 148/77   Pulse 61   Temp 97.6 F (36.4 C)   Ht 5' (1.524 m)   Wt 143 lb 3.2 oz (65 kg)   SpO2 98%   BMI 27.97 kg/m   BP Readings  from Last 3 Encounters:  05/30/20 (!) 148/77  05/24/20 132/71  05/01/20 116/68    Wt Readings from Last 3 Encounters:  05/30/20 143 lb 3.2 oz (65 kg)  05/24/20 143 lb 9.6 oz (65.1 kg)  05/01/20 142 lb 3.2 oz (64.5 kg)     Physical Exam Constitutional:      General: Dominique Farley is not in acute distress.    Appearance: Dominique Farley is well-developed.  HENT:     Head: Normocephalic and atraumatic.  Eyes:     Conjunctiva/sclera: Conjunctivae normal.     Pupils: Pupils are equal, round, and reactive to light.  Neck:     Thyroid: No thyromegaly.  Cardiovascular:     Rate and Rhythm:  Normal rate and regular rhythm.     Heart sounds: Normal heart sounds. No murmur heard.   Pulmonary:     Effort: Pulmonary effort is normal. No respiratory distress.     Breath sounds: Normal breath sounds. No wheezing or rales.  Abdominal:     General: Bowel sounds are normal. There is no distension.     Palpations: Abdomen is soft.     Tenderness: There is no abdominal tenderness.  Musculoskeletal:        General: Normal range of motion.     Cervical back: Normal range of motion and neck supple.     Right lower leg: Edema (4+ with weepiing) present.  Lymphadenopathy:     Cervical: No cervical adenopathy.  Skin:    General: Skin is warm and dry.     Findings: Lesion (posteriorly at the top of the right calf. Pictured below. ) present. No erythema.     Comments: [name] is a skin tear at the mid right leg that is 1X3 CM.Extends into the deep subcutaneous tissue.   Neurological:     Mental Status: Dominique Farley is alert.  Psychiatric:        Mood and Affect: Mood normal.        Behavior: Behavior normal.         Assessment & Plan:   Dominique Farley was seen today for edema.  Diagnoses and all orders for this visit:  Edema of right lower leg -     US Venous Img Lower Unilateral Right; Future  Other orders -     furosemide (LASIX) 20 MG tablet; Take 1 tablet (20 mg total) by mouth daily.       I am having Dominique Farley start on furosemide. I am also having her maintain her aspirin EC, polyethylene glycol, propranolol, mupirocin ointment, fluorouracil, clobetasol cream, atorvastatin, warfarin, lisinopril, amLODipine, primidone, and cephALEXin. We will continue to administer cefTRIAXone.  Allergies as of 05/30/2020      Reactions   Codeine Other (See Comments)   Influenza Vaccines    Sulfa Antibiotics Hives      Medication List       Accurate as of May 30, 2020  3:34 PM. If you have any questions, ask your nurse or doctor.        amLODipine 5 MG tablet Commonly known as:  NORVASC TAKE 1 TABLET BY MOUTH EVERY DAY   aspirin EC 81 MG tablet Take 81 mg by mouth daily. Every other day   atorvastatin 40 MG tablet Commonly known as: LIPITOR TAKE 1 TABLET BY MOUTH AT BEDTIME   cephALEXin 500 MG capsule Commonly known as: KEFLEX Take 1 capsule (500 mg total) by mouth 4 (four) times daily for 14 days.   clobetasol cream 0.05 % Commonly  known as: TEMOVATE APPLY TO AFFECTED AREAS TWICE DAILY UNTIL PSORIATIC LESION RESOLVED   fluorouracil 5 % cream Commonly known as: EFUDEX Apply topically at bedtime.   furosemide 20 MG tablet Commonly known as: LASIX Take 1 tablet (20 mg total) by mouth daily. Started by: Claretta Fraise, MD   lisinopril 20 MG tablet Commonly known as: ZESTRIL TAKE 1 TABLET BY MOUTH EVERY DAY   mupirocin ointment 2 % Commonly known as: BACTROBAN Apply 1 application topically 2 (two) times daily.   polyethylene glycol 17 g packet Commonly known as: MIRALAX / GLYCOLAX Take 17 g by mouth daily.   primidone 50 MG tablet Commonly known as: MYSOLINE Take 1.5 tablets (75 mg total) by mouth 3 (three) times daily.   propranolol 60 MG tablet Commonly known as: INDERAL Take 0.5 tablets (30 mg total) by mouth 2 (two) times daily. For tremors   warfarin 5 MG tablet Commonly known as: COUMADIN Take as directed by the anticoagulation clinic. If you are unsure how to take this medication, talk to your nurse or doctor. Original instructions: TAKE 1-1.5 TABLETS BY MOUTH DAILY AS DIRECTED      [place] ordered. Elevated the leg. Follow up I week wwith Dr. Darnell Level & with Dr. Denna Haggard  Wound dressing applied. Clean and dress daily. Apply polysporin and gauze.   Follow-up: Return if symptoms worsen or fail to improve.  Claretta Fraise, M.D.

## 2020-05-31 ENCOUNTER — Ambulatory Visit (HOSPITAL_COMMUNITY)
Admission: RE | Admit: 2020-05-31 | Discharge: 2020-05-31 | Disposition: A | Discharge status: Home / Self Care | Payer: Medicare Other | Source: Ambulatory Visit | Attending: Family Medicine | Admitting: Family Medicine

## 2020-05-31 DIAGNOSIS — R600 Localized edema: Secondary | ICD-10-CM | POA: Diagnosis not present

## 2020-05-31 DIAGNOSIS — M79661 Pain in right lower leg: Secondary | ICD-10-CM | POA: Diagnosis not present

## 2020-06-03 NOTE — Progress Notes (Signed)
Pt daughter r/c about MRI Results

## 2020-06-06 ENCOUNTER — Ambulatory Visit: Payer: Medicare Other | Admitting: Physician Assistant

## 2020-06-06 ENCOUNTER — Encounter: Payer: Self-pay | Admitting: Physician Assistant

## 2020-06-06 ENCOUNTER — Other Ambulatory Visit: Payer: Self-pay

## 2020-06-06 DIAGNOSIS — L080 Pyoderma: Secondary | ICD-10-CM | POA: Diagnosis not present

## 2020-06-06 MED ORDER — MUPIROCIN 2 % EX OINT: 1.0000 "application " | TOPICAL_OINTMENT | Freq: Two times a day (BID) | CUTANEOUS | 2 refills | Status: DC

## 2020-06-06 MED ORDER — CEPHALEXIN 500 MG PO CAPS: 500.0000 mg | ORAL_CAPSULE | Freq: Two times a day (BID) | ORAL | 0 refills | Status: AC

## 2020-06-06 NOTE — Progress Notes (Signed)
   Follow-Up Visit   Subjective  Dominique Farley is a 87 y.o. female who presents for the following: Follow-up (Patient Is here to follow up on lesion on right lower leg. Patient used mupirocin. Also PCP gave patient oral antibiotic keflex for 2 weeks. She is almost done. Lesion is looking better per patients daughter. The screen door caught her leg. She bled a lot according to her daughter. She also has a wound on the posterior aspect of her right leg that is healing well with Keflex per patient's daughter She is taking Keflex and thinks that it is helping.   The following portions of the chart were reviewed this encounter and updated as appropriate:      Objective  Well appearing patient in no apparent distress; mood and affect are within normal limits.  A focused examination was performed including lower legs. Relevant physical exam findings are noted in the Assessment and Plan.  Objective  Right Lower Leg - Anterior: 4 cm avulsion wound with redness and dried blood.  Images     Assessment & Plan  Pyoderma Right Lower Leg - Anterior  Wound from an injury on right shin 1 week ago. Leg swelled really bad and they saw her PCP for possible blood clot. Ultrasound confirmed no blood clot.   Anaerobic and Aerobic Culture - Right Lower Leg - Anterior  mupirocin ointment (BACTROBAN) 2 % - Right Lower Leg - Anterior  Ordered Medications: cephALEXin (KEFLEX) 500 MG capsule    I, Mychal Decarlo, PA-C, have reviewed all documentation's for this visit.  The documentation on 06/06/20 for the exam, diagnosis, procedures and orders are all accurate and complete.

## 2020-06-09 ENCOUNTER — Encounter: Payer: Self-pay | Admitting: Dermatology

## 2020-06-09 NOTE — Progress Notes (Signed)
   Follow-Up Visit   Subjective  Dominique Farley is a 87 y.o. female who presents for the following: Skin Problem (Right lower leg- same spot grew back- bx'd & treated 10/05/00 lesion has grown back).  Nonhealing spot right calf. Location:  Duration:  Quality:  Associated Signs/Symptoms: Modifying Factors:  Severity:  Timing: Context:   Objective  Well appearing patient in no apparent distress; mood and affect are within normal limits. Objective  Right Lower Leg - Posterior: Ill-defined hemorrhagic 3 cm crust on right upper calf.  Nonfluctuant.  Probable SCCA.  Images      Objective  Left Lower Leg - Anterior: Bilateral edema with hyperpigmentation and some active inflammation lower legs from upper calf to ankles.    A focused examination was performed including Lower extremities. Relevant physical exam findings are noted in the Assessment and Plan.   Assessment & Plan    Encounter for screening for malignant neoplasm of skin Right Lower Leg - Posterior  Family told that I would certainly be pleased to obtain biopsy if the plan was to proceed with either excision by Mohs surgery or SRT therapy (this would be most inconvenient for family).  They will think this over and discuss this and get back to me in the next 2 weeks with a decision about proceeding with biopsy.  Venous stasis dermatitis of right lower extremity Right Lower Leg - Anterior  Venous stasis dermatitis of left lower extremity Left Lower Leg - Anterior  Although no active intervention planned, the presence of significant stasis will certainly affect the healing of any lower extremity procedure.  This was discussed in some detail with Dominique Farley and her daughter.      I, Lavonna Monarch, MD, have reviewed all documentation for this visit.  The documentation on 06/09/20 for the exam, diagnosis, procedures, and orders are all accurate and complete.

## 2020-06-12 LAB — ANAEROBIC AND AEROBIC CULTURE
AER RESULT:: NO GROWTH
MICRO NUMBER:: 11938541
MICRO NUMBER:: 11938542
SPECIMEN QUALITY:: ADEQUATE
SPECIMEN QUALITY:: ADEQUATE

## 2020-06-28 ENCOUNTER — Encounter: Payer: Self-pay | Admitting: Family Medicine

## 2020-06-28 ENCOUNTER — Ambulatory Visit (INDEPENDENT_AMBULATORY_CARE_PROVIDER_SITE_OTHER): Payer: Medicare Other | Admitting: Family Medicine

## 2020-06-28 ENCOUNTER — Other Ambulatory Visit: Payer: Self-pay

## 2020-06-28 VITALS — BP 148/86 | HR 66 | Temp 97.8°F | Ht 60.0 in | Wt 142.0 lb

## 2020-06-28 DIAGNOSIS — I4811 Longstanding persistent atrial fibrillation: Secondary | ICD-10-CM

## 2020-06-28 DIAGNOSIS — Z7901 Long term (current) use of anticoagulants: Secondary | ICD-10-CM

## 2020-06-28 DIAGNOSIS — L989 Disorder of the skin and subcutaneous tissue, unspecified: Secondary | ICD-10-CM

## 2020-06-28 LAB — HEMOGLOBIN, FINGERSTICK: Hemoglobin: 11.9 g/dL (ref 11.1–15.9)

## 2020-06-28 LAB — COAGUCHEK XS/INR WAIVED
INR: 1.7 — ABNORMAL HIGH (ref 0.9–1.1)
Prothrombin Time: 20.8 s

## 2020-06-28 LAB — POCT INR: INR: 1.7 — AB (ref 2–3)

## 2020-06-28 NOTE — Patient Instructions (Signed)
Go up to 1.5 tablets today (Friday) and tomorrow (Saturday). Then go back to normal dosing schedule of 1.5 tablets Monday/ Thursdays and 1 tablet daily all other days.

## 2020-06-28 NOTE — Progress Notes (Signed)
Subjective: CC: INR check PCP: Dominique Norlander, DO CBS:[facility] Dominique Farley is a 87 y.o. female presenting to clinic today for:  1. Afib Here for interval checkup for atrial fibrillation.  She has been compliant with 1-1/2 tablets on Mondays and Thursdays and 1 tablet daily all others.  She had a second round of Keflex since we last saw each other for the cellulitis in the right lower extremity.  In fact she had had some increased swelling shortly after we saw each other and ultrasound was obtained.  DVT was ruled out with this.  She has follow-up with Christiana Pellant on the 27th.  There was concern that this may in fact be a skin cancer versus abrasion that resulted in infection.  She finished antibiotics sometime over the weekend.  She is had no other changes in medications or diet.  Compliant with all medications as her daughters load her prescription box each week   ROS: Per HPI  Allergies  Allergen Reactions   Codeine Other (See Comments)   Influenza Vaccines    Sulfa Antibiotics Hives   Past Medical History:  Diagnosis Date   Atrial fibrillation ([place])    BCC (basal cell carcinoma of skin) 11/14/2004   Right Mid Cheek(Cx3,5FU)   BCC (basal cell carcinoma of skin) 04/16/2011   Right Crease(tx p bx)   BCC (basal cell carcinoma of skin) 06/16/2016   Left Nare(tx p bx)   BCC (basal cell carcinoma of skin) Atypical Basaloid 09/29/2018   Left Side Nose(tx p bx)   Hypertension    Osteoporosis    SCC (squamous cell carcinoma) 09/29/2018   Left Jawline(tx p bx)   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   Right Jawline(Cx3,5FU) and Left Upper Cheek(Cx3,5FU)   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   Left Jawline(tx p bx), Nose(tx p bx), Right Cheek(tx p bx), and Right Forearm(tx p bx)   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   Right [place](Cx3,5FU) and Left Outer Eye(Cx3,5FU)   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   Right Forearm(Cx3,5FU)    Squamous cell carcinoma in situ (SCCIS) 03/13/2005   Left Side of Nose(Cx3,5FU)   Squamous cell carcinoma in situ (SCCIS) 06/04/2005   Inner Right Cheek/Eye(Tx p Bx)   Squamous cell carcinoma in situ (SCCIS) 02/12/2010   Right Cheek(tx p bx)   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 2 09/29/2018   Upper Lip(tx p bx) and Bridge of Nose(tx p bx)   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   Left Forehead, Left Neck, Right Nose, and Right Hand   Stroke ([facility])    Tremor     Current Outpatient Medications:    amLODipine (NORVASC) 5 MG tablet, TAKE 1 TABLET BY MOUTH EVERY DAY, Disp: 30 tablet, Rfl: 1   aspirin EC 81 MG tablet, Take 81 mg by mouth daily. Every other day, Disp: , Rfl:    atorvastatin (LIPITOR) 40 MG tablet, TAKE 1 TABLET BY MOUTH AT BEDTIME, Disp: 30 tablet, Rfl: 1   clobetasol cream (TEMOVATE) 0.05 %, APPLY TO AFFECTED AREAS TWICE DAILY UNTIL PSORIATIC LESION RESOLVED, Disp: 120 g, Rfl: 2   fluorouracil (EFUDEX) 5 % cream, Apply topically at bedtime., Disp: 40 g, Rfl: 1   furosemide (LASIX) 20 MG tablet, Take 1 tablet (20 mg total) by mouth daily., Disp: 30 tablet, Rfl: 3   lisinopril (ZESTRIL) 20 MG tablet, TAKE 1 TABLET BY MOUTH EVERY DAY, Disp: 30 tablet,  Rfl: 1   mupirocin ointment (BACTROBAN) 2 %, Apply 1 application topically 2 (two) times daily., Disp: 22 g, Rfl: 2   mupirocin ointment (BACTROBAN) 2 %, Apply 1 application topically 2 (two) times daily., Disp: 22 g, Rfl: 2   polyethylene glycol (MIRALAX / GLYCOLAX) 17 g packet, Take 17 g by mouth daily., Disp: , Rfl:    primidone (MYSOLINE) 50 MG tablet, Take 1.5 tablets (75 mg total) by mouth 3 (three) times daily., Disp: 405 tablet, Rfl: 0   propranolol (INDERAL) 60 MG tablet, Take 0.5 tablets (30 mg total) by mouth 2 (two) times daily. For tremors, Disp: 30 tablet, Rfl: 11   warfarin (COUMADIN) 5 MG tablet, TAKE 1-1.5 TABLETS BY MOUTH DAILY AS DIRECTED, Disp: 120 tablet, Rfl: 2 Social History    Socioeconomic History   Marital status: Widowed    Spouse name: Not on file   Number of children: 3   Years of education: Not on file   Highest education level: Not on file  Occupational History   Occupation: Retired  Tobacco Use   Smoking status: Never   Smokeless tobacco: Never  Vaping Use   Vaping Use: Never used  Substance and Sexual Activity   Alcohol use: Never   Drug use: Never   Sexual activity: Not Currently  Other Topics Concern   Not on file  Social History Narrative   Not on file   Social Determinants of Health   Financial Resource Strain: Not on file  Food Insecurity: Not on file  Transportation Needs: Not on file  Physical Activity: Not on file  Stress: Not on file  Social Connections: Not on file  Intimate Partner Violence: Not on file   Family History  Problem Relation Age of Onset   Dementia Mother    Stroke Father    Heart disease Father    Diabetes Sister    Stroke Son     Objective: Office vital signs reviewed. BP (!) 148/86   Pulse 66   Temp 97.8 F (36.6 C) (Temporal)   Ht 5' (1.524 m)   Wt 142 lb (64.4 kg)   BMI 27.73 kg/m   Physical Examination:  General: Awake, alert, well nourished, No acute distress HEENT: Normal, sclera white Cardio: Irregularly irregular.  S1S2 heard, no murmurs appreciated Pulm: clear to auscultation bilaterally, no wheezes, rhonchi or rales; normal work of breathing on room air MSK: Slow gait Skin: Erythema has resolved.  She still has a crusty lesion noted along the posterior right calf  Assessment/ Plan: 87 y.o. female   Longstanding persistent atrial fibrillation ([place]) - Plan: CoaguChek XS/INR Waived, CMP14+EGFR, Hemoglobin, fingerstick, POCT INR  Chronic anticoagulation - Plan: CoaguChek XS/INR Waived, CMP14+EGFR, Hemoglobin, fingerstick, POCT INR  Skin lesion of right lower extremity  INR subtherapeutic I suspect that this is secondary to recent antibiotic use for what was cellulitis on the  right lower extremity.  She is to keep follow-up with dermatology as scheduled.  I am going to advance her to 1-1/2 tablets today and tomorrow and then she is to resume use of 1/2 tablets only 2 days/week with 1 tablet daily all others.  Follow-up visit has been scheduled for June 29 at 4 PM.  Awaiting recommendations from [facility] regarding lesion on the posterior right leg which was concerning for possible skin cancer but perhaps may have simply been infected abrasion.  No orders of the defined types were placed in this encounter.  No orders of the defined types were  placed in this encounter.    Dominique Norlander, DO [place] [phone]

## 2020-06-29 LAB — CMP14+EGFR
ALT: 15 IU/L (ref 0–32)
AST: 23 IU/L (ref 0–40)
Albumin/Globulin Ratio: 1.6 (ref 1.2–2.2)
Albumin: 3.9 g/dL (ref 3.5–4.6)
Alkaline Phosphatase: 108 IU/L (ref 44–121)
BUN/Creatinine Ratio: 18 (ref 12–28)
BUN: 14 mg/dL (ref 10–36)
Bilirubin Total: <0.2 mg/dL (ref 0.0–1.2)
CO2: 23 mmol/L (ref 20–29)
Calcium: 8.9 mg/dL (ref 8.7–10.3)
Chloride: 102 mmol/L (ref 96–106)
Creatinine, Ser: 0.77 mg/dL (ref 0.57–1.00)
Globulin, Total: 2.5 g/dL (ref 1.5–4.5)
Glucose: 108 mg/dL — ABNORMAL HIGH (ref 65–99)
Potassium: 4.2 mmol/L (ref 3.5–5.2)
Sodium: 139 mmol/L (ref 134–144)
Total Protein: 6.4 g/dL (ref 6.0–8.5)
eGFR: 73 mL/min/{1.73_m2} (ref >59)

## 2020-07-01 ENCOUNTER — Other Ambulatory Visit: Payer: Self-pay

## 2020-07-01 ENCOUNTER — Ambulatory Visit: Payer: Medicare Other | Admitting: Cardiology

## 2020-07-01 ENCOUNTER — Encounter: Payer: Self-pay | Admitting: Cardiology

## 2020-07-01 VITALS — BP 126/78 | HR 67 | Ht 60.0 in | Wt 142.0 lb

## 2020-07-01 DIAGNOSIS — I4891 Unspecified atrial fibrillation: Secondary | ICD-10-CM | POA: Diagnosis not present

## 2020-07-01 DIAGNOSIS — I10 Essential (primary) hypertension: Secondary | ICD-10-CM

## 2020-07-01 NOTE — Patient Instructions (Signed)
Medication Instructions:  Your physician recommends that you continue on your current medications as directed. Please refer to the Current Medication list given to you today.   *If you need a refill on your cardiac medications before your next appointment, please call your pharmacy*   Lab Work: None today  If you have labs (blood work) drawn today and your tests are completely normal, you will receive your results only by: [place] (if you have MyChart) OR A paper copy in the mail If you have any lab test that is abnormal or we need to change your treatment, we will call you to review the results.   Testing/Procedures: None today    Follow-Up: At [facility], you and your health needs are our priority.  As part of our continuing mission to provide you with exceptional heart care, we have created designated Provider Care Teams.  These Care Teams include your primary Cardiologist (physician) and Advanced Practice Providers (APPs -  Physician Assistants and Nurse Practitioners) who all work together to provide you with the care you need, when you need it.  We recommend signing up for the patient portal called "MyChart".  Sign up information is provided on this After Visit Summary.  MyChart is used to connect with patients for Virtual Visits (Telemedicine).  Patients are able to view lab/test results, encounter notes, upcoming appointments, etc.  Non-urgent messages can be sent to your provider as well.   To learn more about what you can do with MyChart, go to [email or web address].    Your next appointment:   1 year(s)  The format for your next appointment:   In Person  Provider:   Carlyle Dolly, MD   Other Instructions None

## 2020-07-01 NOTE — Progress Notes (Signed)
Clinical Summary Ms. Lookabaugh is a 87 y.o.female seen today for follow up of the following medical problems.     1. Long standing persistent afib - no recent palpitations - no lightheadendess or dizziness. - doing well on coumadin. Could not use DOAC due to being on primidone. INR follwoed by pcp    - no recent palpitations - compliant with meds. No bleeding on coumadin, INR followed by pcp   2. History of CVA - has been on ASA 81mg  every other day it appears   3.HTN - has some component of white coat HTN - remains compliant with meds     4. Tremor - she is on propranolol      Past Medical History:  Diagnosis Date   Atrial fibrillation ([place])    BCC (basal cell carcinoma of skin) 11/14/2004   Right Mid Cheek(Cx3,5FU)   BCC (basal cell carcinoma of skin) 04/16/2011   Right Crease(tx p bx)   BCC (basal cell carcinoma of skin) 06/16/2016   Left Nare(tx p bx)   BCC (basal cell carcinoma of skin) Atypical Basaloid 09/29/2018   Left Side Nose(tx p bx)   Hypertension    Osteoporosis    SCC (squamous cell carcinoma) 09/29/2018   Left Jawline(tx p bx)   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   Right Jawline(Cx3,5FU) and Left Upper Cheek(Cx3,5FU)   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   Left Jawline(tx p bx), Nose(tx p bx), Right Cheek(tx p bx), and Right Forearm(tx p bx)   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   Right Temple(Cx3,5FU) and Left Outer Eye(Cx3,5FU)   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   Right Forearm(Cx3,5FU)   Squamous cell carcinoma in situ (SCCIS) 03/13/2005   Left Side of Nose(Cx3,5FU)   Squamous cell carcinoma in situ (SCCIS) 06/04/2005   Inner Right Cheek/Eye(Tx p Bx)   Squamous cell carcinoma in situ (SCCIS) 02/12/2010   Right Cheek(tx p bx)   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 Every other day     atorvastatin (LIPITOR) 40 MG tablet TAKE 1 TABLET BY MOUTH AT BEDTIME 30 tablet 1   clobetasol cream (TEMOVATE) 0.05 % APPLY TO AFFECTED AREAS TWICE DAILY UNTIL PSORIATIC LESION RESOLVED 120 g 2   fluorouracil (EFUDEX) 5 % cream Apply topically at bedtime. 40 g 1   furosemide (LASIX) 20 MG tablet Take 1 tablet (20 mg total) by mouth daily. 30 tablet 3   lisinopril (ZESTRIL) 20 MG tablet TAKE 1 TABLET BY MOUTH EVERY DAY 30 tablet 1   mupirocin ointment (BACTROBAN) 2 % Apply 1 application topically 2 (two) times daily. 22 g 2   mupirocin ointment (BACTROBAN) 2 % Apply 1 application topically 2 (two) times daily. 22 g 2   polyethylene glycol (MIRALAX / GLYCOLAX) 17 g packet  Take 17 g by mouth daily.     primidone (MYSOLINE) 50 MG tablet Take 1.5 tablets (75 mg total) by mouth 3 (three) times daily. 405 tablet 0   propranolol (INDERAL) 60 MG tablet Take 0.5 tablets (30 mg total) by mouth 2 (two) times daily. For tremors 30 tablet 11   warfarin (COUMADIN) 5 MG tablet TAKE 1-1.5 TABLETS BY MOUTH DAILY AS DIRECTED 120 tablet 2   No current facility-administered medications for this visit.     Past Surgical History:  Procedure Laterality Date   CHOLECYSTECTOMY     skin biopsies       Allergies  Allergen Reactions   Codeine Other (See Comments)   Influenza Vaccines    Sulfa Antibiotics Hives      Family History  Problem Relation Age of Onset   Dementia Mother    Stroke Father    Heart disease Father    Diabetes Sister    Stroke Son      Social  History Ms. Barbuto reports that she has never smoked. She has never used smokeless tobacco. Ms. Strike reports no history of alcohol use.   Review of Systems CONSTITUTIONAL: No weight loss, fever, chills, weakness or fatigue.  HEENT: Eyes: No visual loss, blurred vision, double vision or yellow sclerae.No hearing loss, sneezing, congestion, runny nose or sore throat.  SKIN: No rash or itching.  CARDIOVASCULAR: per hpi RESPIRATORY: No shortness of breath, cough or sputum.  GASTROINTESTINAL: No anorexia, nausea, vomiting or diarrhea. No abdominal pain or blood.  GENITOURINARY: No burning on urination, no polyuria NEUROLOGICAL: No headache, dizziness, syncope, paralysis, ataxia, numbness or tingling in the extremities. No change in bowel or bladder control.  MUSCULOSKELETAL: No muscle, back pain, joint pain or stiffness.  LYMPHATICS: No enlarged nodes. No history of splenectomy.  PSYCHIATRIC: No history of depression or anxiety.  ENDOCRINOLOGIC: No reports of sweating, cold or heat intolerance. No polyuria or polydipsia.  Marland Kitchen   Physical Examination Today's Vitals   07/01/20 1541  BP: 126/78  Pulse: 67  SpO2: 94%  Weight: 142 lb (64.4 kg)  Height: 5' (1.524 m)   Body mass index is 27.73 kg/m.  Gen: resting comfortably, no acute distress HEENT: no scleral icterus, pupils equal round and reactive, no palptable cervical adenopathy,  CV: RRR, no m/r/g, no jvd Resp: Clear to auscultation bilaterally GI: abdomen is soft, non-tender, non-distended, normal bowel sounds, no hepatosplenomegaly MSK: extremities are warm, no edema.  Skin: warm, no rash Neuro:  no focal deficits Psych: appropriate affect   Diagnostic Studies     Assessment and Plan  1. Afib - on coumadin, could not take DOAC due to interaction with primidone - no recent symptoms, continue current meds. On propranolol as beta blocker due to coexisting tremor as well   2. HTN - she is at goal, continue current  meds     Arnoldo Lenis, M.D.

## 2020-07-04 ENCOUNTER — Other Ambulatory Visit: Payer: Self-pay | Admitting: Family Medicine

## 2020-07-04 DIAGNOSIS — I10 Essential (primary) hypertension: Secondary | ICD-10-CM

## 2020-07-04 DIAGNOSIS — I63512 Cerebral infarction due to unspecified occlusion or stenosis of left middle cerebral artery: Secondary | ICD-10-CM

## 2020-07-08 ENCOUNTER — Encounter: Payer: Self-pay | Admitting: Physician Assistant

## 2020-07-08 ENCOUNTER — Ambulatory Visit: Payer: Medicare Other | Admitting: Physician Assistant

## 2020-07-08 ENCOUNTER — Other Ambulatory Visit: Payer: Self-pay

## 2020-07-08 DIAGNOSIS — R234 Changes in skin texture: Secondary | ICD-10-CM | POA: Diagnosis not present

## 2020-07-08 DIAGNOSIS — Z1283 Encounter for screening for malignant neoplasm of skin: Secondary | ICD-10-CM

## 2020-07-08 NOTE — Progress Notes (Signed)
   Follow-Up Visit   Subjective  Dominique Farley is a 87 y.o. female who presents for the following: Follow-up (Follow is on right lower leg patient completed antibiotics and still using the mupirocin ). She is healing well without any pain. Almost completely clear. She has some crusts on her legs to addreaa   The following portions of the chart were reviewed this encounter and updated as appropriate:  Tobacco  Allergies  Meds  Problems  Med Hx  Surg Hx  Fam Hx       Objective  Well appearing patient in no apparent distress; mood and affect are within normal limits.  A focused examination was performed including face, and legs.. Relevant physical exam findings are noted in the Assessment and Plan.  Left Lower Leg - Anterior, Left Lower Leg - Posterior, Right Lower Leg - Anterior, Right Lower Leg - Posterior Few crusts. Wound healing nicely.                Assessment & Plan  Skin exam for malignant neoplasm (4) Left Lower Leg - Anterior; Right Lower Leg - Anterior; Left Lower Leg - Posterior; Right Lower Leg - Posterior  3 month follow up.  Return to clinic if any of the crusts begin to grow or become painful.  I, Hazle Ogburn, PA-C, have reviewed all documentation's for this visit.  The documentation on 07/08/20 for the exam, diagnosis, procedures and orders are all accurate and complete.

## 2020-07-10 ENCOUNTER — Other Ambulatory Visit: Payer: Self-pay

## 2020-07-10 ENCOUNTER — Ambulatory Visit (INDEPENDENT_AMBULATORY_CARE_PROVIDER_SITE_OTHER): Payer: Medicare Other | Admitting: Family Medicine

## 2020-07-10 VITALS — BP 132/78 | HR 55 | Temp 97.8°F | Ht 60.0 in | Wt 140.0 lb

## 2020-07-10 DIAGNOSIS — R791 Abnormal coagulation profile: Secondary | ICD-10-CM | POA: Diagnosis not present

## 2020-07-10 DIAGNOSIS — I4811 Longstanding persistent atrial fibrillation: Secondary | ICD-10-CM | POA: Diagnosis not present

## 2020-07-10 LAB — COAGUCHEK XS/INR WAIVED
INR: 1.8 — ABNORMAL HIGH (ref 0.9–1.1)
Prothrombin Time: 21.9 s

## 2020-07-10 LAB — POCT INR: INR: 1.8 — AB (ref 2–3)

## 2020-07-10 NOTE — Progress Notes (Signed)
Subjective: CC: Chronic anticoagulation follow-up PCP: Dominique Norlander, DO Dominique Farley is a 87 y.o. female presenting to clinic today for:  1.  Atrial fibrillation Patient's goal INR is 2-3.  She is currently anticoagulated with Coumadin and takes 1.5 tablets 2 days/week with 1 tablet daily all other days.  She has had no changes in her medications, oral intake.  No reports of bleeding episodes.   ROS: Per HPI  Allergies  Allergen Reactions   Codeine Other (See Comments)   Influenza Vaccines    Sulfa Antibiotics Hives   Past Medical History:  Diagnosis Date   Atrial fibrillation ([place])    BCC (basal cell carcinoma of skin) 11/14/2004   Right Mid Cheek(Cx3,5FU)   BCC (basal cell carcinoma of skin) 04/16/2011   Right Crease(tx p bx)   BCC (basal cell carcinoma of skin) 06/16/2016   Left Nare(tx p bx)   BCC (basal cell carcinoma of skin) Atypical Basaloid 09/29/2018   Left Side Nose(tx p bx)   Hypertension    Osteoporosis    SCC (squamous cell carcinoma) 09/29/2018   Left Jawline(tx p bx)   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   Right Jawline(Cx3,5FU) and Left Upper Cheek(Cx3,5FU)   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   Left Jawline(tx p bx), Nose(tx p bx), Right Cheek(tx p bx), and Right Forearm(tx p bx)   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   Right Temple(Cx3,5FU) and Left Outer Eye(Cx3,5FU)   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   Right Forearm(Cx3,5FU)   Squamous cell carcinoma in situ (SCCIS) 03/13/2005   Left Side of Nose(Cx3,5FU)   Squamous cell carcinoma in situ (SCCIS) 06/04/2005   Inner Right Cheek/Eye(Tx p Bx)   Squamous cell carcinoma in situ (SCCIS) 02/12/2010   Right Cheek(tx p bx)   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 2 09/29/2018   Upper Lip(tx p bx) and Bridge of Nose(tx p bx)   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   Left Forehead, Left Neck, Right Nose, and Right Hand    Stroke (HCC)    Tremor     Current Outpatient Medications:    amLODipine (NORVASC) 5 MG tablet, TAKE 1 TABLET BY MOUTH EVERY DAY, Disp: 30 tablet, Rfl: 0   aspirin EC 81 MG tablet, Take 81 mg by mouth 3 (three) times a week., Disp: , Rfl:    atorvastatin (LIPITOR) 40 MG tablet, TAKE 1 TABLET BY MOUTH AT BEDTIME, Disp: 30 tablet, Rfl: 0   clobetasol cream (TEMOVATE) 0.05 %, APPLY TO AFFECTED AREAS TWICE DAILY UNTIL PSORIATIC LESION RESOLVED, Disp: 120 g, Rfl: 2   fluorouracil (EFUDEX) 5 % cream, Apply topically at bedtime., Disp: 40 g, Rfl: 1   lisinopril (ZESTRIL) 20 MG tablet, TAKE 1 TABLET BY MOUTH EVERY DAY, Disp: 30 tablet, Rfl: 0   polyethylene glycol (MIRALAX / GLYCOLAX) 17 g packet, Take 17 g by mouth daily., Disp: , Rfl:    primidone (MYSOLINE) 50 MG tablet, Take 1.5 tablets (75 mg total) by mouth 3 (three) times daily., Disp: 405 tablet, Rfl: 0   propranolol (INDERAL) 60 MG tablet, Take 0.5 tablets (30 mg total) by mouth 2 (two) times daily. For tremors, Disp: 30 tablet, Rfl: 11   warfarin (COUMADIN) 5 MG tablet, TAKE 1-1.5 TABLETS BY MOUTH DAILY AS DIRECTED, Disp: 120 tablet, Rfl: 2 Social History   Socioeconomic History   Marital status: Widowed    Spouse name: Not on  file   Number of children: 3   Years of education: Not on file   Highest education level: Not on file  Occupational History   Occupation: Retired  Tobacco Use   Smoking status: Never   Smokeless tobacco: Never  Vaping Use   Vaping Use: Never used  Substance and Sexual Activity   Alcohol use: Never   Drug use: Never   Sexual activity: Not Currently  Other Topics Concern   Not on file  Social History Narrative   Not on file   Social Determinants of Health   Financial Resource Strain: Not on file  Food Insecurity: Not on file  Transportation Needs: Not on file  Physical Activity: Not on file  Stress: Not on file  Social Connections: Not on file  Intimate Partner Violence: Not on file   Family  History  Problem Relation Age of Onset   Dementia Mother    Stroke Father    Heart disease Father    Diabetes Sister    Stroke Son     Objective: Office vital signs reviewed. BP 132/78   Pulse (!) 55   Temp 97.8 F (36.6 C)   Ht 5' (1.524 m)   Wt 140 lb (63.5 kg)   SpO2 97%   BMI 27.34 kg/m   Physical Examination:  General: Awake, alert, well during elderly female, No acute distress Cardio: Irregularly irregular with rate control, S1S2 heard, no murmurs appreciated Pulm: clear to auscultation bilaterally, no wheezes, rhonchi or rales; normal work of breathing on room air  Assessment/ Plan: 87 y.o. female   Subtherapeutic international normalized ratio (INR) - Plan: POCT INR  Longstanding persistent atrial fibrillation ([place]) - Plan: CoaguChek XS/INR Waived, POCT INR  Increase to 7.5 mg Mondays, Tuesdays and Thursdays with 5 mg daily all other days.  We will plan to recheck her on 13 July  No orders of the defined types were placed in this encounter.  No orders of the defined types were placed in this encounter.    Dominique Norlander, DO [place] [phone]

## 2020-07-24 ENCOUNTER — Ambulatory Visit: Payer: Self-pay | Admitting: Family Medicine

## 2020-07-26 ENCOUNTER — Other Ambulatory Visit: Payer: Self-pay

## 2020-07-26 ENCOUNTER — Encounter: Payer: Self-pay | Admitting: Family Medicine

## 2020-07-26 ENCOUNTER — Ambulatory Visit (INDEPENDENT_AMBULATORY_CARE_PROVIDER_SITE_OTHER): Payer: Medicare Other | Admitting: Family Medicine

## 2020-07-26 VITALS — BP 126/73 | HR 82 | Temp 97.6°F | Ht 60.0 in | Wt 140.4 lb

## 2020-07-26 DIAGNOSIS — R791 Abnormal coagulation profile: Secondary | ICD-10-CM

## 2020-07-26 DIAGNOSIS — I4811 Longstanding persistent atrial fibrillation: Secondary | ICD-10-CM

## 2020-07-26 LAB — COAGUCHEK XS/INR WAIVED
INR: 1.6 — ABNORMAL HIGH (ref 0.9–1.1)
Prothrombin Time: 18.7 s

## 2020-07-26 LAB — POCT INR: INR: 1.6 — AB (ref 2–3)

## 2020-07-26 NOTE — Progress Notes (Signed)
Subjective: CC: Subtherapeutic INR PCP: Dominique Norlander, DO ZOX:[id number] Dominique Farley is a 87 y.o. female presenting to clinic today for:  1.  Subtherapeutic INR Patient is on Coumadin for atrial fibrillation.  Goal INR 2-3.  Last visit her INR was subtherapeutic at 1.8, despite increase in Coumadin.  Currently taking 7.5 mg daily for 3 days and then 5 mg daily all other days.  No bleeding, hematochezia or melena.  She continues to have easy bruising.  No chest pain, shortness of breath or heart palpitations.   ROS: Per HPI  Allergies  Allergen Reactions   Codeine Other (See Comments)   Influenza Vaccines    Sulfa Antibiotics Hives   Past Medical History:  Diagnosis Date   Atrial fibrillation ([place])    BCC (basal cell carcinoma of skin) 11/14/2004   Right Mid Cheek(Cx3,5FU)   BCC (basal cell carcinoma of skin) 04/16/2011   Right Crease(tx p bx)   BCC (basal cell carcinoma of skin) 06/16/2016   Left Nare(tx p bx)   BCC (basal cell carcinoma of skin) Atypical Basaloid 09/29/2018   Left Side Nose(tx p bx)   Hypertension    Osteoporosis    SCC (squamous cell carcinoma) 09/29/2018   Left Jawline(tx p bx)   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   Right Jawline(Cx3,5FU) and Left Upper Cheek(Cx3,5FU)   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   Left Jawline(tx p bx), Nose(tx p bx), Right Cheek(tx p bx), and Right Forearm(tx p bx)   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   Right Temple(Cx3,5FU) and Left Outer Eye(Cx3,5FU)   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   Right Forearm(Cx3,5FU)   Squamous cell carcinoma in situ (SCCIS) 03/13/2005   Left Side of Nose(Cx3,5FU)   Squamous cell carcinoma in situ (SCCIS) 06/04/2005   Inner Right Cheek/Eye(Tx p Bx)   Squamous cell carcinoma in situ (SCCIS) 02/12/2010   Right Cheek(tx p bx)   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 2 09/29/2018   Upper Lip(tx p bx) and Bridge of Nose(tx p bx)   Squamous  cell carcinoma in situ (SCCIS) x 4 08/29/2007   Left Forehead, Left Neck, Right Nose, and Right Hand   Stroke (HCC)    Tremor     Current Outpatient Medications:    amLODipine (NORVASC) 5 MG tablet, TAKE 1 TABLET BY MOUTH EVERY DAY, Disp: 30 tablet, Rfl: 0   aspirin EC 81 MG tablet, Take 81 mg by mouth 3 (three) times a week., Disp: , Rfl:    atorvastatin (LIPITOR) 40 MG tablet, TAKE 1 TABLET BY MOUTH AT BEDTIME, Disp: 30 tablet, Rfl: 0   clobetasol cream (TEMOVATE) 0.05 %, APPLY TO AFFECTED AREAS TWICE DAILY UNTIL PSORIATIC LESION RESOLVED, Disp: 120 g, Rfl: 2   fluorouracil (EFUDEX) 5 % cream, Apply topically at bedtime., Disp: 40 g, Rfl: 1   lisinopril (ZESTRIL) 20 MG tablet, TAKE 1 TABLET BY MOUTH EVERY DAY, Disp: 30 tablet, Rfl: 0   polyethylene glycol (MIRALAX / GLYCOLAX) 17 g packet, Take 17 g by mouth daily., Disp: , Rfl:    primidone (MYSOLINE) 50 MG tablet, Take 1.5 tablets (75 mg total) by mouth 3 (three) times daily., Disp: 405 tablet, Rfl: 0   propranolol (INDERAL) 60 MG tablet, Take 0.5 tablets (30 mg total) by mouth 2 (two) times daily. For tremors, Disp: 30 tablet, Rfl: 11   warfarin (COUMADIN) 5 MG tablet, TAKE 1-1.5 TABLETS BY MOUTH DAILY AS DIRECTED, Disp: 120  tablet, Rfl: 2 Social History   Socioeconomic History   Marital status: Widowed    Spouse name: Not on file   Number of children: 3   Years of education: Not on file   Highest education level: Not on file  Occupational History   Occupation: Retired  Tobacco Use   Smoking status: Never   Smokeless tobacco: Never  Vaping Use   Vaping Use: Never used  Substance and Sexual Activity   Alcohol use: Never   Drug use: Never   Sexual activity: Not Currently  Other Topics Concern   Not on file  Social History Narrative   Not on file   Social Determinants of Health   Financial Resource Strain: Not on file  Food Insecurity: Not on file  Transportation Needs: Not on file  Physical Activity: Not on file   Stress: Not on file  Social Connections: Not on file  Intimate Partner Violence: Not on file   Family History  Problem Relation Age of Onset   Dementia Mother    Stroke Father    Heart disease Father    Diabetes Sister    Stroke Son     Objective: Office vital signs reviewed. BP 126/73   Pulse 82   Temp 97.6 F (36.4 C)   Ht 5' (1.524 m)   Wt 140 lb 6.4 oz (63.7 kg)   SpO2 96%   BMI 27.42 kg/m   Physical Examination:  General: Awake, alert, well-appearing elderly female, No acute distress  Assessment/ Plan: 87 y.o. female   Subtherapeutic international normalized ratio (INR) - Plan: CoaguChek XS/INR Waived, POCT INR  Longstanding persistent atrial fibrillation ([place]) - Plan: POCT INR  Persistently subtherapeutic INR.  I find this somewhat confounding because despite multiple adjustments to her Coumadin her INR has remained subtherapeutic.  There are no apparent causes except for use of antibiotics several weeks ago.  I have adjusted her Coumadin up again to 7.5 mg 5 days/week with 5 mg daily all other days.  We will plan to recheck her in 1 week.  She and her daughter are aware of date and time.  Orders Placed This Encounter  Procedures   CoaguChek XS/INR Waived   POCT INR    This order was created through the anticoagulation tracking navigator section.   No orders of the defined types were placed in this encounter.    Dominique Norlander, DO [place] [phone]

## 2020-08-01 ENCOUNTER — Other Ambulatory Visit: Payer: Self-pay | Admitting: Family Medicine

## 2020-08-01 DIAGNOSIS — M79672 Pain in left foot: Secondary | ICD-10-CM | POA: Diagnosis not present

## 2020-08-01 DIAGNOSIS — L110 Acquired keratosis follicularis: Secondary | ICD-10-CM | POA: Diagnosis not present

## 2020-08-01 DIAGNOSIS — M79671 Pain in right foot: Secondary | ICD-10-CM | POA: Diagnosis not present

## 2020-08-01 DIAGNOSIS — I10 Essential (primary) hypertension: Secondary | ICD-10-CM

## 2020-08-01 DIAGNOSIS — I63512 Cerebral infarction due to unspecified occlusion or stenosis of left middle cerebral artery: Secondary | ICD-10-CM

## 2020-08-01 DIAGNOSIS — I739 Peripheral vascular disease, unspecified: Secondary | ICD-10-CM | POA: Diagnosis not present

## 2020-08-02 ENCOUNTER — Other Ambulatory Visit: Payer: Self-pay | Admitting: Family Medicine

## 2020-08-02 DIAGNOSIS — G250 Essential tremor: Secondary | ICD-10-CM

## 2020-08-05 ENCOUNTER — Encounter: Payer: Self-pay | Admitting: Family Medicine

## 2020-08-05 ENCOUNTER — Ambulatory Visit (INDEPENDENT_AMBULATORY_CARE_PROVIDER_SITE_OTHER): Payer: Medicare Other | Admitting: Family Medicine

## 2020-08-05 ENCOUNTER — Other Ambulatory Visit: Payer: Self-pay

## 2020-08-05 VITALS — BP 115/62 | HR 48 | Temp 97.5°F | Ht 60.0 in | Wt 140.4 lb

## 2020-08-05 DIAGNOSIS — Z7901 Long term (current) use of anticoagulants: Secondary | ICD-10-CM | POA: Diagnosis not present

## 2020-08-05 DIAGNOSIS — I6930 Unspecified sequelae of cerebral infarction: Secondary | ICD-10-CM

## 2020-08-05 DIAGNOSIS — I4811 Longstanding persistent atrial fibrillation: Secondary | ICD-10-CM

## 2020-08-05 DIAGNOSIS — R791 Abnormal coagulation profile: Secondary | ICD-10-CM | POA: Diagnosis not present

## 2020-08-05 DIAGNOSIS — I63512 Cerebral infarction due to unspecified occlusion or stenosis of left middle cerebral artery: Secondary | ICD-10-CM

## 2020-08-05 LAB — POCT INR: INR: 1.7 — AB (ref 2–3)

## 2020-08-05 LAB — COAGUCHEK XS/INR WAIVED
INR: 1.7 — ABNORMAL HIGH (ref 0.9–1.1)
Prothrombin Time: 20.3 s

## 2020-08-05 MED ORDER — WARFARIN SODIUM 5 MG PO TABS: 7.5000 mg | ORAL_TABLET | Freq: Every day | ORAL | 3 refills | Status: DC

## 2020-08-05 MED ORDER — PROPRANOLOL HCL 20 MG PO TABS: 20.0000 mg | ORAL_TABLET | Freq: Two times a day (BID) | ORAL | 0 refills | Status: DC

## 2020-08-05 NOTE — Patient Instructions (Signed)
CHANGE in Propranolol dose to '20mg'$  daily.  If her pulse remains below 55, I favor cutting this in half as well.  Plan for recheck in 1 week.  Call me sooner if concerns arise  Increase Warfarin to 1.5 tablets DAILY.

## 2020-08-05 NOTE — Progress Notes (Signed)
Subjective: CC: INR check PCP: [name], DO [id number] Dominique Farley is a 87 y.o. female presenting to clinic today for:  1.  INR check for atrial fibrillation Patient reports compliance with propranolol 15 mg twice daily, Coumadin 1.5 tablets daily except for 1 tablet daily on the weekends.  At last visit her INR was subtherapeutic at 1.6.  She is unfortunately had some difficulties with maintaining her Coumadin level since being treated with antibiotics about a month ago.  She denies any hematuria, epistaxis.  Continues to have easy bruising and bleeding.  Her pulse is lower than normal though this is not checked regularly at home.  Does not report feeling dizzy   ROS: Per HPI  Allergies  Allergen Reactions   Codeine Other (See Comments)   Influenza Vaccines    Sulfa Antibiotics Hives   Past Medical History:  Diagnosis Date   Atrial fibrillation ([place])    BCC (basal cell carcinoma of skin) 11/14/2004   Right Mid Cheek(Cx3,5FU)   BCC (basal cell carcinoma of skin) 04/16/2011   Right Crease(tx p bx)   BCC (basal cell carcinoma of skin) 06/16/2016   Left Nare(tx p bx)   BCC (basal cell carcinoma of skin) Atypical Basaloid 09/29/2018   Left Side Nose(tx p bx)   Hypertension    Osteoporosis    SCC (squamous cell carcinoma) 09/29/2018   Left Jawline(tx p bx)   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   Right Jawline(Cx3,5FU) and Left Upper Cheek(Cx3,5FU)   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   Left Jawline(tx p bx), Nose(tx p bx), Right Cheek(tx p bx), and Right Forearm(tx p bx)   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   Right Temple(Cx3,5FU) and Left Outer Eye(Cx3,5FU)   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   Right Forearm(Cx3,5FU)   Squamous cell carcinoma in situ (SCCIS) 03/13/2005   Left Side of Nose(Cx3,5FU)   Squamous cell carcinoma in situ (SCCIS) 06/04/2005   Inner Right Cheek/Eye(Tx p Bx)   Squamous cell carcinoma in situ  (SCCIS) 02/12/2010   Right Cheek(tx p bx)   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 2 09/29/2018   Upper Lip(tx p bx) and Bridge of Nose(tx p bx)   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   Left Forehead, Left Neck, Right Nose, and Right Hand   Stroke (HCC)    Tremor     Current Outpatient Medications:    amLODipine (NORVASC) 5 MG tablet, TAKE 1 TABLET BY MOUTH EVERY DAY, Disp: 30 tablet, Rfl: 3   aspirin EC 81 MG tablet, Take 81 mg by mouth 3 (three) times a week., Disp: , Rfl:    atorvastatin (LIPITOR) 40 MG tablet, TAKE 1 TABLET BY MOUTH AT BEDTIME, Disp: 30 tablet, Rfl: 3   clobetasol cream (TEMOVATE) 0.05 %, APPLY TO AFFECTED AREAS TWICE DAILY UNTIL PSORIATIC LESION RESOLVED, Disp: 120 g, Rfl: 2   fluorouracil (EFUDEX) 5 % cream, Apply topically at bedtime., Disp: 40 g, Rfl: 1   lisinopril (ZESTRIL) 20 MG tablet, TAKE 1 TABLET BY MOUTH EVERY DAY, Disp: 30 tablet, Rfl: 3   polyethylene glycol (MIRALAX / GLYCOLAX) 17 g packet, Take 17 g by mouth daily., Disp: , Rfl:    primidone (MYSOLINE) 50 MG tablet, TAKE 1 AND 1/2 TABLETS BY MOUTH THREE TIMES DAILY, Disp: 405 tablet, Rfl: 0   propranolol (INDERAL) 60 MG tablet, Take 0.5 tablets (30 mg total) by mouth 2 (two) times daily.  For tremors, Disp: 30 tablet, Rfl: 11   warfarin (COUMADIN) 5 MG tablet, TAKE 1-1.5 TABLETS BY MOUTH DAILY AS DIRECTED, Disp: 120 tablet, Rfl: 2 Social History   Socioeconomic History   Marital status: Widowed    Spouse name: Not on file   Number of children: 3   Years of education: Not on file   Highest education level: Not on file  Occupational History   Occupation: Retired  Tobacco Use   Smoking status: Never   Smokeless tobacco: Never  Vaping Use   Vaping Use: Never used  Substance and Sexual Activity   Alcohol use: Never   Drug use: Never   Sexual activity: Not Currently  Other Topics Concern   Not on file  Social History Narrative   Not on file   Social Determinants of Health    Financial Resource Strain: Not on file  Food Insecurity: Not on file  Transportation Needs: Not on file  Physical Activity: Not on file  Stress: Not on file  Social Connections: Not on file  Intimate Partner Violence: Not on file   Family History  Problem Relation Age of Onset   Dementia Mother    Stroke Father    Heart disease Father    Diabetes Sister    Stroke Son     Objective: Office vital signs reviewed. BP 115/62 Comment: MANUAL  Pulse (!) 45   Temp (!) 97.5 F (36.4 C)   Ht 5' (1.524 m)   Wt 140 lb 6.4 oz (63.7 kg)   SpO2 95%   BMI 27.42 kg/m   Physical Examination:  General: Awake, alert, nontoxic elderly female, No acute distress HEENT: Normal; sclera white Cardio: Irregularly irregular and bradycardic Pulm:  normal work of breathing on room air Extremities: warm, well perfused, No edema, cyanosis or clubbing; +2 pulses bilaterally Skin: Bruised shins and upper extremities noted  Assessment/ Plan: 87 y.o. female   Subtherapeutic international normalized ratio (INR) - Plan: CoaguChek XS/INR Waived, POCT INR  Longstanding persistent atrial fibrillation (HCC) - Plan: propranolol (INDERAL) 20 MG tablet, warfarin (COUMADIN) 5 MG tablet, POCT INR  Chronic anticoagulation - Plan: POCT INR  Cerebrovascular accident (CVA) due to occlusion of left middle cerebral artery (HCC) - Plan: warfarin (COUMADIN) 5 MG tablet, POCT INR  INR remains subtherapeutic despite increase in Coumadin.  Advised to go to 7.5 mg daily.  We will recheck her in 1 week  With regards to her heart rate, this bradycardia was verified manually.  Advised her to reduce her propranolol and have sent in the 20 mg twice daily.  Will CC cardiology as FYI.  We will recheck again in 1 week and if persistently low we will plan to reduce to 10 mg twice daily   Orders Placed This Encounter  Procedures   CoaguChek XS/INR Waived   No orders of the defined types were placed in this  encounter.    [name], DO [place] [phone]

## 2020-08-12 ENCOUNTER — Ambulatory Visit (INDEPENDENT_AMBULATORY_CARE_PROVIDER_SITE_OTHER): Payer: Medicare Other | Admitting: Family Medicine

## 2020-08-12 ENCOUNTER — Other Ambulatory Visit: Payer: Self-pay

## 2020-08-12 ENCOUNTER — Encounter: Payer: Self-pay | Admitting: Family Medicine

## 2020-08-12 VITALS — BP 144/78 | HR 62 | Temp 97.9°F | Ht 60.0 in | Wt 138.8 lb

## 2020-08-12 DIAGNOSIS — I4811 Longstanding persistent atrial fibrillation: Secondary | ICD-10-CM

## 2020-08-12 DIAGNOSIS — D692 Other nonthrombocytopenic purpura: Secondary | ICD-10-CM

## 2020-08-12 DIAGNOSIS — R791 Abnormal coagulation profile: Secondary | ICD-10-CM | POA: Diagnosis not present

## 2020-08-12 DIAGNOSIS — I7300 Raynaud's syndrome without gangrene: Secondary | ICD-10-CM | POA: Diagnosis not present

## 2020-08-12 LAB — POCT INR: INR: 2.5 (ref 2–3)

## 2020-08-12 LAB — COAGUCHEK XS/INR WAIVED
INR: 2.5 — ABNORMAL HIGH (ref 0.9–1.1)
Prothrombin Time: 30 s

## 2020-08-12 NOTE — Progress Notes (Signed)
Subjective: CC: INR check PCP: [name], DO [id number] Dominique Farley is a 87 y.o. female presenting to clinic today for:  1.  Chronic anticoagulation for atrial fibrillation Patient's goal INR is 2-3.  Her last INR was subtherapeutic.  Her prescription was increased to 7.5 mg of Coumadin daily.  She continues to have easy bruising but overall seems to be tolerating the medication increase without difficulty.  Her daughter brings over a tube of Bactroban ointment that has been being applied to her legs and she wonders if this could have caused the abnormality in her Coumadin level.  No reports of heart palpitations or dizziness.   ROS: Per HPI  Allergies  Allergen Reactions   Codeine Other (See Comments)   Influenza Vaccines    Sulfa Antibiotics Hives   Past Medical History:  Diagnosis Date   Atrial fibrillation ([place])    BCC (basal cell carcinoma of skin) 11/14/2004   Right Mid Cheek(Cx3,5FU)   BCC (basal cell carcinoma of skin) 04/16/2011   Right Crease(tx p bx)   BCC (basal cell carcinoma of skin) 06/16/2016   Left Nare(tx p bx)   BCC (basal cell carcinoma of skin) Atypical Basaloid 09/29/2018   Left Side Nose(tx p bx)   Hypertension    Osteoporosis    SCC (squamous cell carcinoma) 09/29/2018   Left Jawline(tx p bx)   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   Right Jawline(Cx3,5FU) and Left Upper Cheek(Cx3,5FU)   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   Left Jawline(tx p bx), Nose(tx p bx), Right Cheek(tx p bx), and Right Forearm(tx p bx)   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   Right Temple(Cx3,5FU) and Left Outer Eye(Cx3,5FU)   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   Right Forearm(Cx3,5FU)   Squamous cell carcinoma in situ (SCCIS) 03/13/2005   Left Side of Nose(Cx3,5FU)   Squamous cell carcinoma in situ (SCCIS) 06/04/2005   Inner Right Cheek/Eye(Tx p Bx)   Squamous cell carcinoma in situ (SCCIS) 02/12/2010   Right Cheek(tx  p bx)   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 2 09/29/2018   Upper Lip(tx p bx) and Bridge of Nose(tx p bx)   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   Left Forehead, Left Neck, Right Nose, and Right Hand   Stroke (HCC)    Tremor     Current Outpatient Medications:    amLODipine (NORVASC) 5 MG tablet, TAKE 1 TABLET BY MOUTH EVERY DAY, Disp: 30 tablet, Rfl: 3   aspirin EC 81 MG tablet, Take 81 mg by mouth 3 (three) times a week., Disp: , Rfl:    atorvastatin (LIPITOR) 40 MG tablet, TAKE 1 TABLET BY MOUTH AT BEDTIME, Disp: 30 tablet, Rfl: 3   clobetasol cream (TEMOVATE) 0.05 %, APPLY TO AFFECTED AREAS TWICE DAILY UNTIL PSORIATIC LESION RESOLVED, Disp: 120 g, Rfl: 2   fluorouracil (EFUDEX) 5 % cream, Apply topically at bedtime., Disp: 40 g, Rfl: 1   lisinopril (ZESTRIL) 20 MG tablet, TAKE 1 TABLET BY MOUTH EVERY DAY, Disp: 30 tablet, Rfl: 3   polyethylene glycol (MIRALAX / GLYCOLAX) 17 g packet, Take 17 g by mouth daily., Disp: , Rfl:    primidone (MYSOLINE) 50 MG tablet, TAKE 1 AND 1/2 TABLETS BY MOUTH THREE TIMES DAILY, Disp: 405 tablet, Rfl: 0   propranolol (INDERAL) 20 MG tablet, Take 1 tablet (20 mg total) by mouth 2 (two) times daily., Disp: 60 tablet, Rfl: 0  warfarin (COUMADIN) 5 MG tablet, Take 1.5 tablets (7.5 mg total) by mouth daily at 4 PM., Disp: 135 tablet, Rfl: 3 Social History   Socioeconomic History   Marital status: Widowed    Spouse name: Not on file   Number of children: 3   Years of education: Not on file   Highest education level: Not on file  Occupational History   Occupation: Retired  Tobacco Use   Smoking status: Never   Smokeless tobacco: Never  Vaping Use   Vaping Use: Never used  Substance and Sexual Activity   Alcohol use: Never   Drug use: Never   Sexual activity: Not Currently  Other Topics Concern   Not on file  Social History Narrative   Not on file   Social Determinants of Health   Financial Resource Strain: Not on file   Food Insecurity: Not on file  Transportation Needs: Not on file  Physical Activity: Not on file  Stress: Not on file  Social Connections: Not on file  Intimate Partner Violence: Not on file   Family History  Problem Relation Age of Onset   Dementia Mother    Stroke Father    Heart disease Father    Diabetes Sister    Stroke Son     Objective: Office vital signs reviewed. BP (!) 144/78   Pulse 62   Temp 97.9 F (36.6 C)   Ht 5' (1.524 m)   Wt 138 lb 12.8 oz (63 kg)   SpO2 96%   BMI 27.11 kg/m   Physical Examination:  General: Awake, alert, well nourished, No acute distress HEENT: Normal; sclera white Cardio: irregularly irregular with rate control, S1S2 heard, no murmurs appreciated Pulm: clear to auscultation bilaterally, no wheezes, rhonchi or rales; normal work of breathing on room air Extremities: Purple hue noted to toes bilaterally.  She has +1 pedal pulses bilaterally MSK: Slow gait.  Ambulates with use of cane  Assessment/ Plan: 87 y.o. female   Longstanding persistent atrial fibrillation ([place]) - Plan: CoaguChek XS/INR Waived, POCT INR  Purpura senilis (HCC)  Raynaud's phenomenon without gangrene - Plan: POCT INR  INR nearly therapeutic at 2.5.  Continue 7.5 mg of Coumadin daily.    Purpura likely secondary to anticoagulation and advanced age with thin skin.  Her feet seem consistent with Raynaud's phenomenon.  She had pedal pulses palpable.  No signs or symptoms concerning for gangrene.  We discussed signs and symptoms of peripheral artery disease including pain with ambulation but patient denied this today.  She will contact me if the color does not come back to normal with warmth and/or if she experiences any concerning symptoms or signs.  Appointment for 6-week follow-up scheduled prior to leaving today.  Orders Placed This Encounter  Procedures   CoaguChek XS/INR Waived   No orders of the defined types were placed in this encounter.    [name], DO [place] [phone]

## 2020-08-12 NOTE — Patient Instructions (Signed)
Raynaud Phenomenon  Raynaud phenomenon is a condition that affects the blood vessels (arteries) that carry blood to your fingers and toes. The arteries that supply blood to your ears, lips, nipples, or the tip of your nose might also be affected. Raynaud phenomenon causes the arteries to become narrow temporarily (spasm). As a result, the flow of blood to the affected areas is temporarily decreased. This usually occurs in response to cold temperatures or stress. During an attack, the skin in the affected areas turns white, then blue, andfinally red. You may also feel tingling or numbness in those areas. Attacks usually last for only a brief period, and then the blood flow to the area returns to normal. In most cases, Raynaud phenomenon does not causeserious health problems. What are the causes? In many cases, the cause of this condition is not known. The condition may occur on its own (primary Raynaud phenomenon) or may be associated with other diseases or factors (secondary Raynaud phenomenon). Possible causes may include: Diseases or medical conditions that damage the arteries. Injuries and repetitive actions that hurt the hands or feet. Being exposed to certain chemicals. Taking medicines that narrow the arteries. Other medical conditions, such as lupus, scleroderma, rheumatoid arthritis, thyroid problems, blood disorders, Sjogren syndrome, or atherosclerosis. What increases the risk? The following factors may make you more likely to develop this condition: Being 4-34 years old. Being female. Having a family history of Raynaud phenomenon. Living in a cold climate. Smoking. What are the signs or symptoms? Symptoms of this condition usually occur when you are exposed to cold temperatures or when you have emotional stress. The symptoms may last for a few minutes or up to several hours. They usually affect your fingers but may also affect your toes, nipples, lips, ears, or the tip of your nose.  Symptoms may include: Changes in skin color. The skin in the affected areas will turn pale or white. The skin may then change from white to bluish to red as normal blood flow returns to the area. Numbness, tingling, or pain in the affected areas. In severe cases, symptoms may include: Skin sores. Tissues decaying and dying (gangrene). How is this diagnosed? This condition may be diagnosed based on: Your symptoms and medical history. A physical exam. During the exam, you may be asked to put your hands in cold water to check for a reaction to cold temperature. Tests, such as: Blood tests to check for other diseases or conditions. A test to check the movement of blood through your arteries and veins (vascular ultrasound). A test in which the skin at the base of your fingernail is examined under a microscope (nailfold capillaroscopy). How is this treated? Treatment for this condition often involves making lifestyle changes and taking steps to control your exposure to cold temperatures. For more severe cases, medicine (calcium channel blockers) may be used to improve blood flow. Surgery is sometimes done to block thenerves that control the affected arteries, but this is rare. Follow these instructions at home: Avoiding cold temperatures Take these steps to avoid exposure to cold: If possible, stay indoors during cold weather. When you go outside during cold weather, dress in layers and wear mittens, a hat, a scarf, and warm footwear. Wear mittens or gloves when handling ice or frozen food. Use holders for glasses or cans containing cold drinks. Let warm water run for a while before taking a shower or bath. Warm up the car before driving in cold weather. Lifestyle If possible, avoid stressful and  emotional situations. Try to find ways to manage your stress, such as: Exercise. Yoga. Meditation. Biofeedback. Do not use any products that contain nicotine or tobacco, such as cigarettes and  e-cigarettes. If you need help quitting, ask your health care provider. Avoid secondhand smoke. Limit your use of caffeine. Switch to decaffeinated coffee, tea, and soda. Avoid chocolate. Avoid vibrating tools and machinery. General instructions Protect your hands and feet from injuries, cuts, or bruises. Avoid wearing tight rings or wristbands. Wear loose fitting socks and comfortable, roomy shoes. Take over-the-counter and prescription medicines only as told by your health care provider. Contact a health care provider if: Your discomfort becomes worse despite lifestyle changes. You develop sores on your fingers or toes that do not heal. Your fingers or toes turn black. You have breaks in the skin on your fingers or toes. You have a fever. You have pain or swelling in your joints. You have a rash. Your symptoms occur on only one side of your body. Summary Raynaud phenomenon is a condition that affects the arteries that carry blood to your fingers, toes, ears, lips, nipples, or the tip of your nose. In many cases, the cause of this condition is not known. Symptoms of this condition include changes in skin color, and numbness and tingling of the affected area. Treatment for this condition includes lifestyle changes, reducing exposure to cold temperatures, and using medicines for severe cases of the condition. Contact your health care provider if your condition worsens despite treatment. This information is not intended to replace advice given to you by your health care provider. Make sure you discuss any questions you have with your healthcare provider. Document Revised: 04/11/2019 Document Reviewed: 05/11/2019 Elsevier Patient Education  [place].

## 2020-08-27 ENCOUNTER — Other Ambulatory Visit: Payer: Self-pay

## 2020-08-27 ENCOUNTER — Ambulatory Visit (INDEPENDENT_AMBULATORY_CARE_PROVIDER_SITE_OTHER): Payer: Medicare Other | Admitting: Family Medicine

## 2020-08-27 ENCOUNTER — Encounter: Payer: Self-pay | Admitting: Family Medicine

## 2020-08-27 VITALS — BP 138/84 | HR 61 | Ht 60.0 in | Wt 140.0 lb

## 2020-08-27 DIAGNOSIS — S8011XA Contusion of right lower leg, initial encounter: Secondary | ICD-10-CM | POA: Diagnosis not present

## 2020-08-27 NOTE — Progress Notes (Signed)
Subjective:  Patient ID: Dominique Farley, female    DOB: 12/03/1926, 87 y.o.   MRN: [id number]  Patient Care Team: [name], DO as PCP - General (Family Medicine) Harl Bowie, Alphonse Guild, MD as PCP - Cardiology (Cardiology) Warren Danes, PA-C as Physician Assistant (Dermatology) Lavonna Monarch, MD as Consulting Physician (Dermatology)   Chief Complaint:  Skin Discoloration   HPI: Dominique Farley is a 87 y.o. female presenting on 08/27/2020 for Skin Discoloration   Pt presents today with complaints of skin discoloration to right lower leg, probable blood blister per pt. Onset several days ago. Unaware of injury or hitting let on anything. Denies pain, erythema, active bleeding, or other associated symptoms.    Relevant past medical, surgical, family, and social history reviewed and updated as indicated.  Allergies and medications reviewed and updated. Data reviewed: Chart in Epic.   Past Medical History:  Diagnosis Date   Atrial fibrillation ([place])    BCC (basal cell carcinoma of skin) 11/14/2004   Right Mid Cheek(Cx3,5FU)   BCC (basal cell carcinoma of skin) 04/16/2011   Right Crease(tx p bx)   BCC (basal cell carcinoma of skin) 06/16/2016   Left Nare(tx p bx)   BCC (basal cell carcinoma of skin) Atypical Basaloid 09/29/2018   Left Side Nose(tx p bx)   Hypertension    Osteoporosis    SCC (squamous cell carcinoma) 09/29/2018   Left Jawline(tx p bx)   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   Right Jawline(Cx3,5FU) and Left Upper Cheek(Cx3,5FU)   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   Left Jawline(tx p bx), Nose(tx p bx), Right Cheek(tx p bx), and Right Forearm(tx p bx)   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   Right Temple(Cx3,5FU) and Left Outer Eye(Cx3,5FU)   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   Right Forearm(Cx3,5FU)   Squamous cell carcinoma in situ (SCCIS) 03/13/2005   Left Side of Nose(Cx3,5FU)   Squamous cell  carcinoma in situ (SCCIS) 06/04/2005   Inner Right Cheek/Eye(Tx p Bx)   Squamous cell carcinoma in situ (SCCIS) 02/12/2010   Right Cheek(tx p bx)   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 2 09/29/2018   Upper Lip(tx p bx) and Bridge of Nose(tx p bx)   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   Left Forehead, Left Neck, Right Nose, and Right Hand   Stroke [facility])    Tremor     Past Surgical History:  Procedure Laterality Date   CHOLECYSTECTOMY     skin biopsies      Social History   Socioeconomic History   Marital status: Widowed    Spouse name: Not on file   Number of children: 3   Years of education: Not on file   Highest education level: Not on file  Occupational History   Occupation: Retired  Tobacco Use   Smoking status: Never   Smokeless tobacco: Never  Vaping Use   Vaping Use: Never used  Substance and Sexual Activity   Alcohol use: Never   Drug use: Never   Sexual activity: Not Currently  Other Topics Concern   Not on file  Social History Narrative   Not on file   Social Determinants of Health   Financial Resource Strain: Not on file  Food Insecurity: Not on file  Transportation Needs: Not on file  Physical Activity: Not on file  Stress: Not on file  Social Connections: Not on file  Intimate Partner Violence: Not on file    Outpatient Encounter Medications as of 08/27/2020  Medication Sig   amLODipine (NORVASC) 5 MG tablet TAKE 1 TABLET BY MOUTH EVERY DAY   aspirin EC 81 MG tablet Take 81 mg by mouth 3 (three) times a week.   atorvastatin (LIPITOR) 40 MG tablet TAKE 1 TABLET BY MOUTH AT BEDTIME   clobetasol cream (TEMOVATE) 0.05 % APPLY TO AFFECTED AREAS TWICE DAILY UNTIL PSORIATIC LESION RESOLVED   fluorouracil (EFUDEX) 5 % cream Apply topically at bedtime.   lisinopril (ZESTRIL) 20 MG tablet TAKE 1 TABLET BY MOUTH EVERY DAY   polyethylene glycol (MIRALAX / GLYCOLAX) 17 g packet Take 17 g by mouth daily.   primidone (MYSOLINE) 50 MG  tablet TAKE 1 AND 1/2 TABLETS BY MOUTH THREE TIMES DAILY   propranolol (INDERAL) 20 MG tablet Take 1 tablet (20 mg total) by mouth 2 (two) times daily.   warfarin (COUMADIN) 5 MG tablet Take 1.5 tablets (7.5 mg total) by mouth daily at 4 PM.   No facility-administered encounter medications on file as of 08/27/2020.    Allergies  Allergen Reactions   Codeine Other (See Comments)   Influenza Vaccines    Sulfa Antibiotics Hives    Review of Systems  Constitutional:  Negative for activity change, appetite change, chills, diaphoresis, fatigue, fever and unexpected weight change.  Musculoskeletal:  Positive for gait problem (uses cane).  Skin:  Positive for color change and wound. Negative for pallor and rash.  All other systems reviewed and are negative.      Objective:  BP 138/84   Pulse 61   Ht 5' (1.524 m)   Wt 140 lb (63.5 kg)   SpO2 97%   BMI 27.34 kg/m    Wt Readings from Last 3 Encounters:  08/27/20 140 lb (63.5 kg)  08/12/20 138 lb 12.8 oz (63 kg)  08/05/20 140 lb 6.4 oz (63.7 kg)    Physical Exam Vitals and nursing note reviewed.  Constitutional:      General: She is not in acute distress.    Appearance: Normal appearance. She is well-developed and well-groomed. She is not ill-appearing, toxic-appearing or diaphoretic.     Comments: Frail, elderly  HENT:     Head: Normocephalic and atraumatic.     Jaw: There is normal jaw occlusion.     Right Ear: Hearing normal.     Left Ear: Hearing normal.     Nose: Nose normal.     Mouth/Throat:     Lips: Pink.     Mouth: Mucous membranes are moist.     Pharynx: Oropharynx is clear. Uvula midline.  Eyes:     General: Lids are normal.     Extraocular Movements: Extraocular movements intact.     Conjunctiva/sclera: Conjunctivae normal.     Pupils: Pupils are equal, round, and reactive to light.  Neck:     Thyroid: No thyroid mass, thyromegaly or thyroid tenderness.     Vascular: No carotid bruit or JVD.     Trachea:  Trachea and phonation normal.  Cardiovascular:     Rate and Rhythm: Normal rate and regular rhythm.     Chest Wall: PMI is not displaced.     Pulses: Normal pulses.     Heart sounds: Normal heart sounds. No murmur heard.   No friction rub. No gallop.  Pulmonary:     Effort: Pulmonary effort is normal. No respiratory distress.     Breath sounds: Normal breath sounds. No  wheezing.  Abdominal:     General: Bowel sounds are normal. There is no distension or abdominal bruit.     Palpations: Abdomen is soft. There is no hepatomegaly or splenomegaly.     Tenderness: There is no abdominal tenderness. There is no right CVA tenderness or left CVA tenderness.     Hernia: No hernia is present.  Musculoskeletal:        General: Normal range of motion.     Cervical back: Normal range of motion and neck supple.     Right lower leg: No edema.     Left lower leg: No edema.  Lymphadenopathy:     Cervical: No cervical adenopathy.  Skin:    General: Skin is warm and dry.     Capillary Refill: Capillary refill takes less than 2 seconds.     Coloration: Skin is not cyanotic, jaundiced or pale.     Findings: Wound present. No erythema or rash.       Neurological:     General: No focal deficit present.     Mental Status: She is alert and oriented to person, place, and time.     Cranial Nerves: Cranial nerves are intact.     Sensory: Sensation is intact.     Motor: Motor function is intact.     Coordination: Coordination is intact.     Gait: Gait abnormal (uses cane).     Deep Tendon Reflexes: Reflexes are normal and symmetric.  Psychiatric:        Attention and Perception: Attention and perception normal.        Mood and Affect: Mood and affect normal.        Speech: Speech normal.        Behavior: Behavior normal. Behavior is cooperative.        Thought Content: Thought content normal.        Cognition and Memory: Cognition and memory normal.        Judgment: Judgment normal.    Results for  orders placed or performed in visit on 08/12/20  CoaguChek XS/INR Waived  Result Value Ref Range   INR 2.5 (H) 0.9 - 1.1   Prothrombin Time 30.0 sec  POCT INR  Result Value Ref Range   INR 2.5 2 - 3     I & D  Date/Time: 08/27/2020 4:05 PM Performed by: Baruch Gouty, FNP Authorized by: Baruch Gouty, FNP   Consent:    Consent obtained:  Verbal   Consent given by:  Patient (daughter)   Risks, benefits, and alternatives were discussed: yes     Risks discussed:  Bleeding, incomplete drainage, pain and infection   Alternatives discussed:  No treatment Universal protocol:    Procedure explained and questions answered to patient or proxy's satisfaction: yes     Relevant documents present and verified: yes     Immediately prior to procedure a time out was called: yes     Patient identity confirmed:  Verbally with patient Location:    Type:  Hematoma   Size:  1 cm   Location:  Lower extremity   Lower extremity location:  Leg   Leg location:  R lower leg Pre-procedure details:    Skin preparation:  Alcohol Sedation:    Sedation type:  None Anesthesia:    Anesthesia method:  None Procedure type:    Complexity:  Simple Procedure details:    Needle aspiration: yes     Needle size:  18 G  Incision types:  Single straight   Incision depth:  Subcutaneous   Drainage:  Bloody (dark, clotted)   Drainage amount:  Scant   Wound treatment:  Wound left open   Packing materials:  None (gauze and coban applied post procedure) Post-procedure details:    Procedure completion:  Tolerated well, no immediate complications   Pertinent labs & imaging results that were available during my care of the patient were reviewed by me and considered in my medical decision making.  Assessment & Plan:  Monchelle was seen today for skin discoloration.  Diagnoses and all orders for this visit:  Hematoma of right lower leg Hematoma drained in office. Pt tolerated well. No bleeding present. Bandage  applied and wound care discussed in detail.     Continue all other maintenance medications.  Follow up plan: Return if symptoms worsen or fail to improve.   Continue healthy lifestyle choices, including diet (rich in fruits, vegetables, and lean proteins, and low in salt and simple carbohydrates) and exercise (at least 30 minutes of moderate physical activity daily).  Educational handout given for hematoma  The above assessment and management plan was discussed with the patient. The patient verbalized understanding of and has agreed to the management plan. Patient is aware to call the clinic if they develop any new symptoms or if symptoms persist or worsen. Patient is aware when to return to the clinic for a follow-up visit. Patient educated on when it is appropriate to go to the emergency department.   Monia Pouch, FNP-C [facility] [phone]

## 2020-09-01 ENCOUNTER — Other Ambulatory Visit: Payer: Self-pay | Admitting: Family Medicine

## 2020-09-01 DIAGNOSIS — I4811 Longstanding persistent atrial fibrillation: Secondary | ICD-10-CM

## 2020-09-10 ENCOUNTER — Ambulatory Visit: Payer: Self-pay | Admitting: Family Medicine

## 2020-09-23 ENCOUNTER — Other Ambulatory Visit: Payer: Self-pay

## 2020-09-23 ENCOUNTER — Encounter: Payer: Self-pay | Admitting: Family Medicine

## 2020-09-23 ENCOUNTER — Ambulatory Visit (INDEPENDENT_AMBULATORY_CARE_PROVIDER_SITE_OTHER): Payer: Medicare Other | Admitting: Family Medicine

## 2020-09-23 VITALS — BP 130/80 | Temp 97.3°F | Ht 60.0 in | Wt 138.8 lb

## 2020-09-23 DIAGNOSIS — I4811 Longstanding persistent atrial fibrillation: Secondary | ICD-10-CM | POA: Diagnosis not present

## 2020-09-23 DIAGNOSIS — Z7901 Long term (current) use of anticoagulants: Secondary | ICD-10-CM

## 2020-09-23 LAB — COAGUCHEK XS/INR WAIVED
INR: 2.4 — ABNORMAL HIGH (ref 0.9–1.1)
Prothrombin Time: 28.5 s

## 2020-09-23 NOTE — Progress Notes (Signed)
Subjective: [id number] PCP: Dominique Norlander, DO [id number] Dominique Farley is a 87 y.o. female, who is brought to the office by her daughter [name].  She is presenting to clinic today for:  1.  Atrial fibrillation Patient is chronically anticoagulated with Coumadin with goal INR of 2-3.  Last visit was August 12, 2020 and INR was therapeutic at that time.  She is here for 6-week follow-up. No missed doses.  No reports of bleeding.   ROS: Per HPI  Allergies  Allergen Reactions   Codeine Other (See Comments)   Influenza Vaccines    Sulfa Antibiotics Hives   Past Medical History:  Diagnosis Date   Atrial fibrillation ([place])    BCC (basal cell carcinoma of skin) 11/14/2004   Right Mid Cheek(Cx3,5FU)   BCC (basal cell carcinoma of skin) 04/16/2011   Right Crease(tx p bx)   BCC (basal cell carcinoma of skin) 06/16/2016   Left Nare(tx p bx)   BCC (basal cell carcinoma of skin) Atypical Basaloid 09/29/2018   Left Side Nose(tx p bx)   Hypertension    Osteoporosis    SCC (squamous cell carcinoma) 09/29/2018   Left Jawline(tx p bx)   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   Right Jawline(Cx3,5FU) and Left Upper Cheek(Cx3,5FU)   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   Left Jawline(tx p bx), Nose(tx p bx), Right Cheek(tx p bx), and Right Forearm(tx p bx)   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   Right Temple(Cx3,5FU) and Left Outer Eye(Cx3,5FU)   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   Right Forearm(Cx3,5FU)   Squamous cell carcinoma in situ (SCCIS) 03/13/2005   Left Side of Nose(Cx3,5FU)   Squamous cell carcinoma in situ (SCCIS) 06/04/2005   Inner Right Cheek/Eye(Tx p Bx)   Squamous cell carcinoma in situ (SCCIS) 02/12/2010   Right Cheek(tx p bx)   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 2 09/29/2018   Upper Lip(tx p bx) and Bridge of Nose(tx p bx)   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   Left Forehead, Left Neck, Right Nose,  and Right Hand   Stroke ([facility])    Tremor     Current Outpatient Medications:    amLODipine (NORVASC) 5 MG tablet, TAKE 1 TABLET BY MOUTH EVERY DAY, Disp: 30 tablet, Rfl: 3   aspirin EC 81 MG tablet, Take 81 mg by mouth 3 (three) times a week., Disp: , Rfl:    atorvastatin (LIPITOR) 40 MG tablet, TAKE 1 TABLET BY MOUTH AT BEDTIME, Disp: 30 tablet, Rfl: 3   clobetasol cream (TEMOVATE) 0.05 %, APPLY TO AFFECTED AREAS TWICE DAILY UNTIL PSORIATIC LESION RESOLVED, Disp: 120 g, Rfl: 2   fluorouracil (EFUDEX) 5 % cream, Apply topically at bedtime., Disp: 40 g, Rfl: 1   lisinopril (ZESTRIL) 20 MG tablet, TAKE 1 TABLET BY MOUTH EVERY DAY, Disp: 30 tablet, Rfl: 3   polyethylene glycol (MIRALAX / GLYCOLAX) 17 g packet, Take 17 g by mouth daily., Disp: , Rfl:    primidone (MYSOLINE) 50 MG tablet, TAKE 1 AND 1/2 TABLETS BY MOUTH THREE TIMES DAILY, Disp: 405 tablet, Rfl: 0   propranolol (INDERAL) 20 MG tablet, TAKE 1 TABLET BY MOUTH TWICE DAILY, Disp: 60 tablet, Rfl: 0   warfarin (COUMADIN) 5 MG tablet, Take 1.5 tablets (7.5 mg total) by mouth daily at 4 PM., Disp: 135 tablet, Rfl: 3 Social History   Socioeconomic History   Marital status: Widowed    Spouse  name: Not on file   Number of children: 3   Years of education: Not on file   Highest education level: Not on file  Occupational History   Occupation: Retired  Tobacco Use   Smoking status: Never   Smokeless tobacco: Never  Vaping Use   Vaping Use: Never used  Substance and Sexual Activity   Alcohol use: Never   Drug use: Never   Sexual activity: Not Currently  Other Topics Concern   Not on file  Social History Narrative   Not on file   Social Determinants of Health   Financial Resource Strain: Not on file  Food Insecurity: Not on file  Transportation Needs: Not on file  Physical Activity: Not on file  Stress: Not on file  Social Connections: Not on file  Intimate Partner Violence: Not on file   Family History  Problem Relation  Age of Onset   Dementia Mother    Stroke Father    Heart disease Father    Diabetes Sister    Stroke Son     Objective: Office vital signs reviewed. BP 130/80   Temp (!) 97.3 F (36.3 C)   Ht 5' (1.524 m)   Wt 138 lb 12.8 oz (63 kg)   BMI 27.11 kg/m   Physical Examination:  General: Awake, alert, well appearing elderly female, No acute distress Cardio: regular rate  Pulm: Normal work of breathing on room air MSK: Ambulating with use cane  Assessment/ Plan: 87 y.o. female   Longstanding persistent atrial fibrillation ([place]) - Plan: CANCELED: Protime-INR  Chronic anticoagulation - Plan: CoaguChek XS/INR Waived, CANCELED: Protime-INR  Therapeutic INR 2.4. No changes to regimen.  Return in 6-8 weeks for recheck.  No orders of the defined types were placed in this encounter.  No orders of the defined types were placed in this encounter.    Dominique Norlander, DO [place] [phone]

## 2020-09-30 ENCOUNTER — Other Ambulatory Visit: Payer: Self-pay | Admitting: Family Medicine

## 2020-09-30 DIAGNOSIS — I4811 Longstanding persistent atrial fibrillation: Secondary | ICD-10-CM

## 2020-10-15 ENCOUNTER — Ambulatory Visit: Payer: Medicare Other | Admitting: Physician Assistant

## 2020-10-24 DIAGNOSIS — M79672 Pain in left foot: Secondary | ICD-10-CM | POA: Diagnosis not present

## 2020-10-24 DIAGNOSIS — L110 Acquired keratosis follicularis: Secondary | ICD-10-CM | POA: Diagnosis not present

## 2020-10-24 DIAGNOSIS — M79671 Pain in right foot: Secondary | ICD-10-CM | POA: Diagnosis not present

## 2020-10-24 DIAGNOSIS — I739 Peripheral vascular disease, unspecified: Secondary | ICD-10-CM | POA: Diagnosis not present

## 2020-10-30 ENCOUNTER — Other Ambulatory Visit: Payer: Self-pay | Admitting: Family Medicine

## 2020-10-30 DIAGNOSIS — G250 Essential tremor: Secondary | ICD-10-CM

## 2020-11-12 ENCOUNTER — Other Ambulatory Visit: Payer: Self-pay

## 2020-11-12 ENCOUNTER — Ambulatory Visit (INDEPENDENT_AMBULATORY_CARE_PROVIDER_SITE_OTHER): Payer: Medicare Other | Admitting: Family Medicine

## 2020-11-12 ENCOUNTER — Encounter: Payer: Self-pay | Admitting: Family Medicine

## 2020-11-12 VITALS — BP 132/86 | HR 92 | Temp 97.6°F | Ht 60.0 in | Wt 141.0 lb

## 2020-11-12 DIAGNOSIS — Z7901 Long term (current) use of anticoagulants: Secondary | ICD-10-CM | POA: Diagnosis not present

## 2020-11-12 DIAGNOSIS — I872 Venous insufficiency (chronic) (peripheral): Secondary | ICD-10-CM | POA: Diagnosis not present

## 2020-11-12 LAB — COAGUCHEK XS/INR WAIVED
INR: 2.7 — ABNORMAL HIGH (ref 0.9–1.1)
Prothrombin Time: 32.4 s

## 2020-11-12 NOTE — Progress Notes (Signed)
Subjective: CC: INR check PCP: Dominique Norlander, DO PQZ:RAQT Dominique Farley is a 87 y.o. female presenting to clinic today for:  1.  Atrial fibrillation Goal INR 2-3 Patient is compliant with Coumadin.  No reports of bleeding.  She has a lesion on the right upper extremity that she does not recall injuring herself but it looks to be a healing skin tear.  Her assistant noticed some swelling and irritation of the left lower extremity and she wanted take a look at this.  That redness has since resolved.   ROS: Per HPI  Allergies  Allergen Reactions   Codeine Other (See Comments)   Influenza Vaccines    Sulfa Antibiotics Hives   Past Medical History:  Diagnosis Date   Atrial fibrillation ([place])    BCC (basal cell carcinoma of skin) 11/14/2004   Right Mid Cheek(Cx3,5FU)   BCC (basal cell carcinoma of skin) 04/16/2011   Right Crease(tx p bx)   BCC (basal cell carcinoma of skin) 06/16/2016   Left Nare(tx p bx)   BCC (basal cell carcinoma of skin) Atypical Basaloid 09/29/2018   Left Side Nose(tx p bx)   Hypertension    Osteoporosis    SCC (squamous cell carcinoma) 09/29/2018   Left Jawline(tx p bx)   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   Right Jawline(Cx3,5FU) and Left Upper Cheek(Cx3,5FU)   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   Left Jawline(tx p bx), Nose(tx p bx), Right Cheek(tx p bx), and Right Forearm(tx p bx)   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   Right Temple(Cx3,5FU) and Left Outer Eye(Cx3,5FU)   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   Right Forearm(Cx3,5FU)   Squamous cell carcinoma in situ (SCCIS) 03/13/2005   Left Side of Nose(Cx3,5FU)   Squamous cell carcinoma in situ (SCCIS) 06/04/2005   Inner Right Cheek/Eye(Tx p Bx)   Squamous cell carcinoma in situ (SCCIS) 02/12/2010   Right Cheek(tx p bx)   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 2 09/29/2018   Upper Lip(tx p bx) and Bridge of Nose(tx p bx)   Squamous cell  carcinoma in situ (SCCIS) x 4 08/29/2007   Left Forehead, Left Neck, Right Nose, and Right Hand   Stroke ([facility])    Tremor     Current Outpatient Medications:    amLODipine (NORVASC) 5 MG tablet, TAKE 1 TABLET BY MOUTH EVERY DAY, Disp: 30 tablet, Rfl: 3   aspirin EC 81 MG tablet, Take 81 mg by mouth 3 (three) times a week., Disp: , Rfl:    atorvastatin (LIPITOR) 40 MG tablet, TAKE 1 TABLET BY MOUTH AT BEDTIME, Disp: 30 tablet, Rfl: 3   clobetasol cream (TEMOVATE) 0.05 %, APPLY TO AFFECTED AREAS TWICE DAILY UNTIL PSORIATIC LESION RESOLVED, Disp: 120 g, Rfl: 2   fluorouracil (EFUDEX) 5 % cream, Apply topically at bedtime., Disp: 40 g, Rfl: 1   lisinopril (ZESTRIL) 20 MG tablet, TAKE 1 TABLET BY MOUTH EVERY DAY, Disp: 30 tablet, Rfl: 3   polyethylene glycol (MIRALAX / GLYCOLAX) 17 g packet, Take 17 g by mouth daily., Disp: , Rfl:    primidone (MYSOLINE) 50 MG tablet, TAKE 1 AND 1/2 TABLETS BY MOUTH THREE TIMES DAILY, Disp: 405 tablet, Rfl: 0   propranolol (INDERAL) 20 MG tablet, TAKE 1 TABLET BY MOUTH TWICE DAILY, Disp: 60 tablet, Rfl: 1   warfarin (COUMADIN) 5 MG tablet, Take 1.5 tablets (7.5 mg total) by mouth daily at 4 PM., Disp: 135 tablet, Rfl: 3  Social History   Socioeconomic History   Marital status: Widowed    Spouse name: Not on file   Number of children: 3   Years of education: Not on file   Highest education level: Not on file  Occupational History   Occupation: Retired  Tobacco Use   Smoking status: Never   Smokeless tobacco: Never  Vaping Use   Vaping Use: Never used  Substance and Sexual Activity   Alcohol use: Never   Drug use: Never   Sexual activity: Not Currently  Other Topics Concern   Not on file  Social History Narrative   Not on file   Social Determinants of Health   Financial Resource Strain: Not on file  Food Insecurity: Not on file  Transportation Needs: Not on file  Physical Activity: Not on file  Stress: Not on file  Social Connections: Not on  file  Intimate Partner Violence: Not on file   Family History  Problem Relation Age of Onset   Dementia Mother    Stroke Father    Heart disease Father    Diabetes Sister    Stroke Son     Objective: Office vital signs reviewed. BP 132/86   Pulse 92   Temp 97.6 F (36.4 C)   Ht 5' (1.524 m)   Wt 141 lb (64 kg)   SpO2 97%   BMI 27.54 kg/m   Physical Examination:  General: Awake, alert, No acute distress Cardio: regular rate and rhythm, S1S2 heard, no murmurs appreciated Pulm: clear to auscultation bilaterally, no wheezes, rhonchi or rales; normal work of breathing on room air Extremities: warm, well perfused, Trace ankle edema, No cyanosis or clubbing; +2 pulses bilaterally Skin: Stasis dermatitis of bilateral lower extremities left greater than right.  She has a healing skin tear on the right upper extremity  Assessment/ Plan: 87 y.o. female   Chronic anticoagulation - Plan: CoaguChek XS/INR Waived  Venous stasis dermatitis of both lower extremities  INR therapeutic.  No changes.  Return in 6 weeks for recheck  Lasix prn edema for next few days.  Monitor for s/s of infection.  Zip up compression hose may be of benefit.  Elevate lower extremities  Orders Placed This Encounter  Procedures   CoaguChek XS/INR Waived   No orders of the defined types were placed in this encounter.    Dominique Norlander, DO [place] [phone]

## 2020-11-14 ENCOUNTER — Telehealth: Payer: Self-pay | Admitting: Orthopaedic Surgery

## 2020-11-14 NOTE — Telephone Encounter (Signed)
Pt daughter called and states pt would like to get scheduled for bil knee injections at the [facility].  CB [phone]

## 2020-11-14 NOTE — Telephone Encounter (Signed)
Called patient's daughter. Scheduled.

## 2020-11-20 ENCOUNTER — Telehealth: Payer: Self-pay | Admitting: Family Medicine

## 2020-11-20 DIAGNOSIS — R531 Weakness: Secondary | ICD-10-CM | POA: Insufficient documentation

## 2020-11-20 DIAGNOSIS — Z20822 Contact with and (suspected) exposure to covid-19: Secondary | ICD-10-CM | POA: Diagnosis not present

## 2020-11-20 DIAGNOSIS — I10 Essential (primary) hypertension: Secondary | ICD-10-CM | POA: Diagnosis not present

## 2020-11-20 DIAGNOSIS — J9811 Atelectasis: Secondary | ICD-10-CM | POA: Diagnosis not present

## 2020-11-20 DIAGNOSIS — I482 Chronic atrial fibrillation, unspecified: Secondary | ICD-10-CM | POA: Diagnosis not present

## 2020-11-20 DIAGNOSIS — Z7901 Long term (current) use of anticoagulants: Secondary | ICD-10-CM | POA: Diagnosis not present

## 2020-11-20 DIAGNOSIS — Z602 Problems related to living alone: Secondary | ICD-10-CM | POA: Diagnosis not present

## 2020-11-20 DIAGNOSIS — I951 Orthostatic hypotension: Secondary | ICD-10-CM | POA: Diagnosis not present

## 2020-11-20 DIAGNOSIS — R319 Hematuria, unspecified: Secondary | ICD-10-CM | POA: Diagnosis not present

## 2020-11-20 DIAGNOSIS — I517 Cardiomegaly: Secondary | ICD-10-CM | POA: Diagnosis not present

## 2020-11-20 DIAGNOSIS — Z79899 Other long term (current) drug therapy: Secondary | ICD-10-CM | POA: Diagnosis not present

## 2020-11-20 DIAGNOSIS — R42 Dizziness and giddiness: Secondary | ICD-10-CM | POA: Diagnosis not present

## 2020-11-20 DIAGNOSIS — J189 Pneumonia, unspecified organism: Secondary | ICD-10-CM | POA: Insufficient documentation

## 2020-11-20 DIAGNOSIS — R0902 Hypoxemia: Secondary | ICD-10-CM | POA: Diagnosis not present

## 2020-11-20 DIAGNOSIS — R310 Gross hematuria: Secondary | ICD-10-CM | POA: Diagnosis not present

## 2020-11-20 DIAGNOSIS — N3001 Acute cystitis with hematuria: Secondary | ICD-10-CM | POA: Diagnosis not present

## 2020-11-20 DIAGNOSIS — I69351 Hemiplegia and hemiparesis following cerebral infarction affecting right dominant side: Secondary | ICD-10-CM | POA: Diagnosis not present

## 2020-11-20 DIAGNOSIS — I4819 Other persistent atrial fibrillation: Secondary | ICD-10-CM | POA: Diagnosis not present

## 2020-11-20 DIAGNOSIS — J90 Pleural effusion, not elsewhere classified: Secondary | ICD-10-CM | POA: Diagnosis not present

## 2020-11-20 HISTORY — DX: Weakness: R53.1

## 2020-11-20 HISTORY — DX: Pneumonia, unspecified organism: J18.9

## 2020-11-20 NOTE — Telephone Encounter (Signed)
Pt's blood pressure is running high, daughter does not know the readings because the caregiver called her but did not give her any information other than that the blood pressure was high and the pt was feeling dizzy. Please call back and advise.

## 2020-11-20 NOTE — Telephone Encounter (Signed)
Daughter called and stated her bp was 161/113, 160/92, 160/98 with left side pain. Patient has a history of heart disease and stroke. Patient daughter advised to call EMS and have her evaluated.

## 2020-11-22 DIAGNOSIS — N3001 Acute cystitis with hematuria: Secondary | ICD-10-CM | POA: Insufficient documentation

## 2020-11-22 HISTORY — DX: Acute cystitis with hematuria: N30.01

## 2020-11-25 ENCOUNTER — Telehealth: Payer: Self-pay

## 2020-11-25 NOTE — Telephone Encounter (Addendum)
Transition Care Management Unsuccessful Follow-up Telephone Call  Date of discharge and from where:  [facility] 11/22/20  Diagnosis: pneumonia   Attempts:  2nd Attempt  Reason for unsuccessful TCM follow-up call:  Left voice message on home number    She has called back at some point and made TCM appt for 11/22  Transition Care Management Unsuccessful Follow-up Telephone Call  Date of discharge and from where:  [facility] 11/22/20 - pneumonia  Attempts:  3rd Attempt  Reason for unsuccessful TCM follow-up call:  Unable to reach patient

## 2020-11-25 NOTE — Telephone Encounter (Signed)
Transition Care Management Unsuccessful Follow-up Telephone Call  Date of discharge and from where:  [facility] 11/22/20  Diagnosis: pneumonia   Attempts:  1st Attempt  Reason for unsuccessful TCM follow-up call:  Left voice message on cell

## 2020-11-26 ENCOUNTER — Telehealth: Payer: Self-pay | Admitting: Family Medicine

## 2020-11-26 ENCOUNTER — Other Ambulatory Visit: Payer: Self-pay | Admitting: Family Medicine

## 2020-11-26 DIAGNOSIS — I10 Essential (primary) hypertension: Secondary | ICD-10-CM

## 2020-11-26 MED ORDER — LISINOPRIL 30 MG PO TABS: 30.0000 mg | ORAL_TABLET | Freq: Every day | ORAL | 1 refills | Status: DC

## 2020-11-26 NOTE — Telephone Encounter (Signed)
I am not aware of either of those antibiotics causing elevations of blood pressure.  I have sent over lisinopril 30 mg so that she will no longer have to take 1-1/2 tablets of her current dose going forward.  Please make sure that her caregiver knows that this is the 30 mg tablet being sent over and she will only require a singular tablet once she switches from current bottle

## 2020-11-26 NOTE — Telephone Encounter (Signed)
Pt needs a new rx for lisinopril with new instructions.--send to [facility]. [name] called to make sure and no more refills on file.

## 2020-11-26 NOTE — Telephone Encounter (Signed)
Have her start taking 1.5 tablets of the lisinopril daily to equal 30mg .  Continue monitoring BP.  Keep visit scheduled for next week.

## 2020-11-26 NOTE — Telephone Encounter (Signed)
TOC appt made for 12/03/20, daughter aware

## 2020-11-26 NOTE — Telephone Encounter (Signed)
Dominique Farley states she needs a new refill sent in to [facility]. States with giving her 1 1/2 she will run out. Also is wondering if abx AZITHROMYCIN 500MG   CEFDINIR 300MG  could be the cause to her bp being high?

## 2020-11-26 NOTE — Telephone Encounter (Signed)
Pt's daughter calling to talk to nurse abour pt's bp and pulse readings. Her bp was 165/88 and pulse was 60 - this was checked by caregiver and this was the lowest reading they were able to get. Please call back.

## 2020-11-26 NOTE — Telephone Encounter (Signed)
Dominique Farley aware

## 2020-11-27 NOTE — Telephone Encounter (Signed)
Spoke with [name] and she is aware

## 2020-11-28 ENCOUNTER — Other Ambulatory Visit: Payer: Self-pay | Admitting: Family Medicine

## 2020-11-28 DIAGNOSIS — I63512 Cerebral infarction due to unspecified occlusion or stenosis of left middle cerebral artery: Secondary | ICD-10-CM

## 2020-11-28 DIAGNOSIS — I10 Essential (primary) hypertension: Secondary | ICD-10-CM

## 2020-11-28 DIAGNOSIS — I4811 Longstanding persistent atrial fibrillation: Secondary | ICD-10-CM

## 2020-12-03 ENCOUNTER — Ambulatory Visit (INDEPENDENT_AMBULATORY_CARE_PROVIDER_SITE_OTHER): Payer: Medicare Other | Admitting: Family Medicine

## 2020-12-03 ENCOUNTER — Other Ambulatory Visit: Payer: Self-pay

## 2020-12-03 ENCOUNTER — Encounter: Payer: Self-pay | Admitting: Family Medicine

## 2020-12-03 ENCOUNTER — Other Ambulatory Visit: Payer: Self-pay | Admitting: Family Medicine

## 2020-12-03 VITALS — BP 132/75 | HR 71 | Temp 98.2°F | Ht 60.0 in | Wt 138.0 lb

## 2020-12-03 DIAGNOSIS — Z7901 Long term (current) use of anticoagulants: Secondary | ICD-10-CM

## 2020-12-03 DIAGNOSIS — Z09 Encounter for follow-up examination after completed treatment for conditions other than malignant neoplasm: Secondary | ICD-10-CM | POA: Diagnosis not present

## 2020-12-03 DIAGNOSIS — R791 Abnormal coagulation profile: Secondary | ICD-10-CM | POA: Diagnosis not present

## 2020-12-03 DIAGNOSIS — Z87440 Personal history of urinary (tract) infections: Secondary | ICD-10-CM

## 2020-12-03 DIAGNOSIS — I10 Essential (primary) hypertension: Secondary | ICD-10-CM | POA: Diagnosis not present

## 2020-12-03 DIAGNOSIS — J189 Pneumonia, unspecified organism: Secondary | ICD-10-CM

## 2020-12-03 LAB — COAGUCHEK XS/INR WAIVED
INR: 3.6 — ABNORMAL HIGH (ref 0.9–1.1)
Prothrombin Time: 42.8 s

## 2020-12-03 LAB — POCT INR: INR: 3.5 — AB (ref 2–3)

## 2020-12-03 MED ORDER — AMLODIPINE BESYLATE 5 MG PO TABS: 7.5000 mg | ORAL_TABLET | Freq: Every day | ORAL | 1 refills | Status: DC

## 2020-12-03 NOTE — Telephone Encounter (Signed)
It looks like you had started the lasix for PRN basis-is it appropriate to fill?

## 2020-12-03 NOTE — Progress Notes (Signed)
Subjective: CC:Pna PCP: [name], DO Dominique Farley is a 87 y.o. female who is brought to the office by her daughter.  She is presenting to clinic today for:  1. Hospital discharge follow up Patient was hospitalized for left lower lobe pneumonia and UTI.  She did not require oxygen.  She was treated with Rocephin and azithromycin but ultimately transitioned over to cefdinir and azithromycin at discharge.  Her urine culture grew E. coli that is sensitive to current antibiotics.  Her white blood cell count was normal at discharge.  She was discharged with home health physical therapy and Occupational Therapy.  She is here today for interval evaluation she has completed the antibiotics as prescribed.  She denies ever having had any cough, congestion, fevers or shortness of breath.  She denies any dysuria, hematuria.  She feels well.  Had some intermittent left lower quadrant pain but that resolved after using a heating pad.   ROS: Per HPI  Allergies  Allergen Reactions   Codeine Other (See Comments)   Influenza Vaccines    Sulfa Antibiotics Hives   Past Medical History:  Diagnosis Date   Atrial fibrillation ([place])    BCC (basal cell carcinoma of skin) 11/14/2004   Right Mid Cheek(Cx3,5FU)   BCC (basal cell carcinoma of skin) 04/16/2011   Right Crease(tx p bx)   BCC (basal cell carcinoma of skin) 06/16/2016   Left Nare(tx p bx)   BCC (basal cell carcinoma of skin) Atypical Basaloid 09/29/2018   Left Side Nose(tx p bx)   Hypertension    Osteoporosis    SCC (squamous cell carcinoma) 09/29/2018   Left Jawline(tx p bx)   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   Right Jawline(Cx3,5FU) and Left Upper Cheek(Cx3,5FU)   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   Left Jawline(tx p bx), Nose(tx p bx), Right Cheek(tx p bx), and Right Forearm(tx p bx)   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   Right Temple(Cx3,5FU) and Left Outer Eye(Cx3,5FU)   Squamous  cell carcinoma in situ (SCCIS) 11/14/2004   Right Forearm(Cx3,5FU)   Squamous cell carcinoma in situ (SCCIS) 03/13/2005   Left Side of Nose(Cx3,5FU)   Squamous cell carcinoma in situ (SCCIS) 06/04/2005   Inner Right Cheek/Eye(Tx p Bx)   Squamous cell carcinoma in situ (SCCIS) 02/12/2010   Right Cheek(tx p bx)   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 2 09/29/2018   Upper Lip(tx p bx) and Bridge of Nose(tx p bx)   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   Left Forehead, Left Neck, Right Nose, and Right Hand   Stroke ([facility])    Tremor     Current Outpatient Medications:    amLODipine (NORVASC) 5 MG tablet, TAKE 1 TABLET BY MOUTH EVERY DAY, Disp: 30 tablet, Rfl: 0   aspirin EC 81 MG tablet, Take 81 mg by mouth 3 (three) times a week., Disp: , Rfl:    atorvastatin (LIPITOR) 40 MG tablet, TAKE 1 TABLET BY MOUTH AT BEDTIME, Disp: 30 tablet, Rfl: 0   azithromycin (ZITHROMAX) 500 MG tablet, Take 500 mg by mouth daily., Disp: , Rfl:    cefdinir (OMNICEF) 300 MG capsule, Take 300 mg by mouth 2 (two) times daily., Disp: , Rfl:    clobetasol cream (TEMOVATE) 0.05 %, APPLY TO AFFECTED AREAS TWICE DAILY UNTIL PSORIATIC LESION RESOLVED, Disp: 120 g, Rfl: 2   fluorouracil (EFUDEX) 5 % cream, Apply topically at bedtime., Disp: 40 g,  Rfl: 1   furosemide (LASIX) 20 MG tablet, Take 20 mg by mouth daily., Disp: , Rfl:    lisinopril (ZESTRIL) 30 MG tablet, Take 1 tablet (30 mg total) by mouth daily. NOTE CHANGE IN DOSE, Disp: 90 tablet, Rfl: 1   polyethylene glycol (MIRALAX / GLYCOLAX) 17 g packet, Take 17 g by mouth daily., Disp: , Rfl:    primidone (MYSOLINE) 50 MG tablet, TAKE 1 AND 1/2 TABLETS BY MOUTH THREE TIMES DAILY, Disp: 405 tablet, Rfl: 0   propranolol (INDERAL) 20 MG tablet, TAKE 1 TABLET BY MOUTH TWICE DAILY, Disp: 60 tablet, Rfl: 0   warfarin (COUMADIN) 5 MG tablet, Take 1.5 tablets (7.5 mg total) by mouth daily at 4 PM., Disp: 135 tablet, Rfl: 3 Social History   Socioeconomic  History   Marital status: Widowed    Spouse name: Not on file   Number of children: 3   Years of education: Not on file   Highest education level: Not on file  Occupational History   Occupation: Retired  Tobacco Use   Smoking status: Never   Smokeless tobacco: Never  Vaping Use   Vaping Use: Never used  Substance and Sexual Activity   Alcohol use: Never   Drug use: Never   Sexual activity: Not Currently  Other Topics Concern   Not on file  Social History Narrative   Not on file   Social Determinants of Health   Financial Resource Strain: Not on file  Food Insecurity: Not on file  Transportation Needs: Not on file  Physical Activity: Not on file  Stress: Not on file  Social Connections: Not on file  Intimate Partner Violence: Not on file   Family History  Problem Relation Age of Onset   Dementia Mother    Stroke Father    Heart disease Father    Diabetes Sister    Stroke Son     Objective: Office vital signs reviewed. BP 132/75   Pulse 71   Temp 98.2 F (36.8 C) (Temporal)   Ht 5' (1.524 m)   Wt 138 lb (62.6 kg)   SpO2 96%   BMI 26.95 kg/m   Physical Examination:  General: Awake, alert, nontoxic elderly female, No acute distress HEENT: Normal; sclera white.  Moist mucous membranes Cardio: irregularly irregular with controlled rate, S1S2 heard, no murmurs appreciated Pulm: clear to auscultation bilaterally, no wheezes, rhonchi or rales; normal work of breathing on room air GU: No suprapubic tenderness to palpation.  No CVA tenderness palpation.  Assessment/ Plan: 87 y.o. female   Hospital discharge follow-up - Plan: POCT INR  Pneumonia of left lower lobe due to infectious organism - Plan: POCT INR  Recent urinary tract infection - Plan: POCT INR  Supratherapeutic INR - Plan: POCT INR  Chronic anticoagulation - Plan: CoaguChek XS/INR Waived, POCT INR  Essential hypertension - Plan: amLODipine (NORVASC) 5 MG tablet  INR is unfortunately  supratherapeutic and this is likely related to recent use of azithromycin for presumed left lower lobe pneumonia.  I reviewed her hospital discharge summary and it sounds like this was an empiric treatment as she was not symptomatic from a pulmonary standpoint.  I advised her to reduce her Coumadin to 0 tablets today then may resume normal daily dosing tomorrow.  She has follow-up visit scheduled on the 12th with me.  Asymptomatic from a UTI standpoint.  She is completed her antibiotics.  Continue p.o. hydration  Blood pressure now controlled with 7.5 mg of Norvasc.  I  have represcribed this to reflect current usage  No orders of the defined types were placed in this encounter.  No orders of the defined types were placed in this encounter.   [name], DO [place] [phone]

## 2020-12-11 ENCOUNTER — Telehealth: Payer: Self-pay | Admitting: Radiology

## 2020-12-11 ENCOUNTER — Encounter: Payer: Self-pay | Admitting: Orthopaedic Surgery

## 2020-12-11 ENCOUNTER — Other Ambulatory Visit: Payer: Self-pay

## 2020-12-11 ENCOUNTER — Ambulatory Visit (INDEPENDENT_AMBULATORY_CARE_PROVIDER_SITE_OTHER): Payer: Medicare Other | Admitting: Orthopaedic Surgery

## 2020-12-11 VITALS — Ht 60.0 in | Wt 138.0 lb

## 2020-12-11 DIAGNOSIS — M170 Bilateral primary osteoarthritis of knee: Secondary | ICD-10-CM

## 2020-12-11 NOTE — Progress Notes (Signed)
Office Visit Note   Patient: Dominique Farley           Date of Birth: [birth year]           MRN: [id number] Visit Date: 12/11/2020              Requested by: [name], DO [place],  [place] [address] PCP: [name], DO   Assessment & Plan: Visit Diagnoses:  1. Bilateral primary osteoarthritis of knee     Plan: Mrs. Mcnally is accompanied by her family member requesting bilateral Visco supplementation injections in her knees.  These need to be precertified with the insurance company so we will plan to see her back in 2 weeks proceed with injected the more symptomatic left knee.  She has had viscosupplementation in the past with excellent relief.  She has had x-rays demonstrating advanced osteoarthritis.  Follow-Up Instructions: Return in about 2 weeks (around 12/25/2020).   Orders:  No orders of the defined types were placed in this encounter.  No orders of the defined types were placed in this encounter.     Procedures: No procedures performed   Clinical Data: No additional findings.   Subjective: Chief Complaint  Patient presents with   Left Knee - Pain  Patient is here with complaint of left knee pain. She had called to make an appointment requesting injection. She completed a series of three Synvisc injections on 07/12/2019 with great relief. She ambulates with cane. She takes tylenol as needed for pain.  HPI  Review of Systems   Objective: Vital Signs: Ht 5' (1.524 m)   Wt 138 lb (62.6 kg)   BMI 26.95 kg/m   Physical Exam  Ortho Exam small effusions both knees.  Appears to have increased valgus bilaterally with mostly lateral joint pain.  Lacks few degrees to full extension of flexed probably 95 to 100 degrees without instability  Specialty Comments:  No specialty comments available.  Imaging: No results found.   PMFS History: Patient Active Problem List   Diagnosis Date Noted   Acute cystitis with hematuria 11/22/2020    Pneumonia of lower lobe due to infectious organism 11/20/2020   Weakness 11/20/2020   Squamous cell carcinoma in situ (SCCIS) of skin 10/25/2019   Unilateral primary osteoarthritis, left knee 07/12/2019   Bilateral primary osteoarthritis of knee 06/14/2019   Atrial fibrillation ([place]) 11/04/2018   Encounter for monitoring Coumadin therapy 11/04/2018   Cerebrovascular accident (CVA) due to occlusion of left middle cerebral artery ([place]) 10/18/2018   Unilateral primary osteoarthritis, right knee 10/05/2018   Tremor 08/24/2018   Essential hypertension 08/24/2018   Age related osteoporosis 08/24/2018   Past Medical History:  Diagnosis Date   Atrial fibrillation ([employer])    BCC (basal cell carcinoma of skin) 11/14/2004   Right Mid Cheek(Cx3,5FU)   BCC (basal cell carcinoma of skin) 04/16/2011   Right Crease(tx p bx)   BCC (basal cell carcinoma of skin) 06/16/2016   Left Nare(tx p bx)   BCC (basal cell carcinoma of skin) Atypical Basaloid 09/29/2018   Left Side Nose(tx p bx)   Hypertension    Osteoporosis    SCC (squamous cell carcinoma) 09/29/2018   Left Jawline(tx p bx)   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   Right Jawline(Cx3,5FU) and Left Upper Cheek(Cx3,5FU)   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   Left Jawline(tx p bx), Nose(tx p bx), Right Cheek(tx p bx), and Right Forearm(tx p bx)  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   Right Temple(Cx3,5FU) and Left Outer Eye(Cx3,5FU)   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   Right Forearm(Cx3,5FU)   Squamous cell carcinoma in situ (SCCIS) 03/13/2005   Left Side of Nose(Cx3,5FU)   Squamous cell carcinoma in situ (SCCIS) 06/04/2005   Inner Right Cheek/Eye(Tx p Bx)   Squamous cell carcinoma in situ (SCCIS) 02/12/2010   Right Cheek(tx p bx)   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 2 09/29/2018   Upper Lip(tx p bx) and Bridge of Nose(tx p bx)   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 4  08/29/2007   Left Forehead, Left Neck, Right Nose, and Right Hand   Stroke ([place])    Tremor     Family History  Problem Relation Age of Onset   Dementia Mother    Stroke Father    Heart disease Father    Diabetes Sister    Stroke Son     Past Surgical History:  Procedure Laterality Date   CHOLECYSTECTOMY     skin biopsies     Social History   Occupational History   Occupation: Retired  Tobacco Use   Smoking status: Never   Smokeless tobacco: Never  Vaping Use   Vaping Use: Never used  Substance and Sexual Activity   Alcohol use: Never   Drug use: Never   Sexual activity: Not Currently

## 2020-12-11 NOTE — Telephone Encounter (Signed)
[place] with [facility] office to submit for left knee gel injection.

## 2020-12-19 DIAGNOSIS — R2681 Unsteadiness on feet: Secondary | ICD-10-CM | POA: Diagnosis not present

## 2020-12-19 DIAGNOSIS — M6281 Muscle weakness (generalized): Secondary | ICD-10-CM | POA: Diagnosis not present

## 2020-12-23 ENCOUNTER — Encounter: Payer: Self-pay | Admitting: Family Medicine

## 2020-12-23 ENCOUNTER — Ambulatory Visit (INDEPENDENT_AMBULATORY_CARE_PROVIDER_SITE_OTHER): Payer: Medicare Other | Admitting: Family Medicine

## 2020-12-23 VITALS — BP 140/70 | HR 54 | Temp 97.3°F | Ht 60.0 in | Wt 136.4 lb

## 2020-12-23 DIAGNOSIS — I4811 Longstanding persistent atrial fibrillation: Secondary | ICD-10-CM

## 2020-12-23 DIAGNOSIS — Z7901 Long term (current) use of anticoagulants: Secondary | ICD-10-CM | POA: Diagnosis not present

## 2020-12-23 DIAGNOSIS — R791 Abnormal coagulation profile: Secondary | ICD-10-CM | POA: Diagnosis not present

## 2020-12-23 DIAGNOSIS — I6930 Unspecified sequelae of cerebral infarction: Secondary | ICD-10-CM | POA: Diagnosis not present

## 2020-12-23 DIAGNOSIS — I63512 Cerebral infarction due to unspecified occlusion or stenosis of left middle cerebral artery: Secondary | ICD-10-CM

## 2020-12-23 LAB — POCT INR: INR: 4.8 — AB (ref 2–3)

## 2020-12-23 NOTE — Progress Notes (Signed)
Subjective: CC: INR check PCP: [name], DO Dominique Farley is a 87 y.o. female presenting to clinic today for:  1.  Atrial fibrillation Goal INR 2-3 She reports compliance with Coumadin 7.5 mg daily.  Denies any hematuria, hematochezia, melena or epistaxis.  No change in exercise tolerance.  No change in diet.  Recently treated for pneumonia a few weeks ago.  INR was supratherapeutic last visit   ROS: Per HPI  Allergies  Allergen Reactions   Codeine Other (See Comments)   Influenza Vaccines    Sulfa Antibiotics Hives   Past Medical History:  Diagnosis Date   Atrial fibrillation ([place])    BCC (basal cell carcinoma of skin) 11/14/2004   Right Mid Cheek(Cx3,5FU)   BCC (basal cell carcinoma of skin) 04/16/2011   Right Crease(tx p bx)   BCC (basal cell carcinoma of skin) 06/16/2016   Left Nare(tx p bx)   BCC (basal cell carcinoma of skin) Atypical Basaloid 09/29/2018   Left Side Nose(tx p bx)   Hypertension    Osteoporosis    SCC (squamous cell carcinoma) 09/29/2018   Left Jawline(tx p bx)   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   Right Jawline(Cx3,5FU) and Left Upper Cheek(Cx3,5FU)   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   Left Jawline(tx p bx), Nose(tx p bx), Right Cheek(tx p bx), and Right Forearm(tx p bx)   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   Right Temple(Cx3,5FU) and Left Outer Eye(Cx3,5FU)   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   Right Forearm(Cx3,5FU)   Squamous cell carcinoma in situ (SCCIS) 03/13/2005   Left Side of Nose(Cx3,5FU)   Squamous cell carcinoma in situ (SCCIS) 06/04/2005   Inner Right Cheek/Eye(Tx p Bx)   Squamous cell carcinoma in situ (SCCIS) 02/12/2010   Right Cheek(tx p bx)   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 2 09/29/2018   Upper Lip(tx p bx) and Bridge of Nose(tx p bx)   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   Left Forehead, Left Neck, Right Nose, and Right  Hand   Stroke ([facility])    Tremor     Current Outpatient Medications:    amLODipine (NORVASC) 5 MG tablet, Take 1.5 tablets (7.5 mg total) by mouth daily., Disp: 135 tablet, Rfl: 1   aspirin EC 81 MG tablet, Take 81 mg by mouth 3 (three) times a week., Disp: , Rfl:    atorvastatin (LIPITOR) 40 MG tablet, TAKE 1 TABLET BY MOUTH AT BEDTIME, Disp: 30 tablet, Rfl: 0   clobetasol cream (TEMOVATE) 0.05 %, APPLY TO AFFECTED AREAS TWICE DAILY UNTIL PSORIATIC LESION RESOLVED, Disp: 120 g, Rfl: 2   lisinopril (ZESTRIL) 30 MG tablet, Take 1 tablet (30 mg total) by mouth daily. NOTE CHANGE IN DOSE, Disp: 90 tablet, Rfl: 1   polyethylene glycol (MIRALAX / GLYCOLAX) 17 g packet, Take 17 g by mouth daily., Disp: , Rfl:    primidone (MYSOLINE) 50 MG tablet, TAKE 1 AND 1/2 TABLETS BY MOUTH THREE TIMES DAILY, Disp: 405 tablet, Rfl: 0   propranolol (INDERAL) 20 MG tablet, TAKE 1 TABLET BY MOUTH TWICE DAILY, Disp: 60 tablet, Rfl: 0   warfarin (COUMADIN) 5 MG tablet, Take 1.5 tablets (7.5 mg total) by mouth daily at 4 PM., Disp: 135 tablet, Rfl: 3 Social History   Socioeconomic History   Marital status: Widowed    Spouse name: Not on file   Number of children: 3   Years of  education: Not on file   Highest education level: Not on file  Occupational History   Occupation: Retired  Tobacco Use   Smoking status: Never   Smokeless tobacco: Never  Vaping Use   Vaping Use: Never used  Substance and Sexual Activity   Alcohol use: Never   Drug use: Never   Sexual activity: Not Currently  Other Topics Concern   Not on file  Social History Narrative   Not on file   Social Determinants of Health   Financial Resource Strain: Not on file  Food Insecurity: Not on file  Transportation Needs: Not on file  Physical Activity: Not on file  Stress: Not on file  Social Connections: Not on file  Intimate Partner Violence: Not on file   Family History  Problem Relation Age of Onset   Dementia Mother    Stroke  Father    Heart disease Father    Diabetes Sister    Stroke Son     Objective: Office vital signs reviewed. BP 140/70   Pulse (!) 54   Temp (!) 97.3 F (36.3 C)   Ht 5' (1.524 m)   Wt 136 lb 6.4 oz (61.9 kg)   SpO2 91%   BMI 26.64 kg/m   Physical Examination:  General: Awake, alert, nontoxic-appearing elderly female, No acute distress HEENT: No conjunctival pallor Cardio: irregularly irregular with bradycardic rate Pulm: Clear to auscultation bilaterally.  Normal work of breathing on room air  Assessment/ Plan: 87 y.o. female   Supratherapeutic INR - Plan: POCT INR  Longstanding persistent atrial fibrillation ([place]) - Plan: POCT INR  Chronic anticoagulation - Plan: CoaguChek XS/INR Waived, POCT INR  Cerebrovascular accident (CVA) due to occlusion of left middle cerebral artery ([place]) - Plan: POCT INR  INR remains supratherapeutic at 4.8.  I have asked that she skip the next 2 days of Coumadin and then she will resume Coumadin at 7.5 mg only 3 days/week with 5 mg daily all other days.  We will recheck in 1 week.  Orders Placed This Encounter  Procedures   CoaguChek XS/INR Waived   No orders of the defined types were placed in this encounter.    [name], DO [place] [phone]

## 2020-12-24 LAB — COAGUCHEK XS/INR WAIVED
INR: 6.3 (ref 0.9–1.1)
Prothrombin Time: 75.9 s

## 2020-12-25 ENCOUNTER — Encounter: Payer: Self-pay | Admitting: Orthopaedic Surgery

## 2020-12-25 ENCOUNTER — Ambulatory Visit (INDEPENDENT_AMBULATORY_CARE_PROVIDER_SITE_OTHER): Payer: Medicare Other | Admitting: Orthopaedic Surgery

## 2020-12-25 ENCOUNTER — Other Ambulatory Visit: Payer: Self-pay

## 2020-12-25 DIAGNOSIS — M1712 Unilateral primary osteoarthritis, left knee: Secondary | ICD-10-CM

## 2020-12-25 DIAGNOSIS — M6281 Muscle weakness (generalized): Secondary | ICD-10-CM | POA: Diagnosis not present

## 2020-12-25 DIAGNOSIS — R2681 Unsteadiness on feet: Secondary | ICD-10-CM | POA: Diagnosis not present

## 2020-12-25 MED ORDER — HYLAN G-F 20 16 MG/2ML IX SOSY
16.0000 mg | PREFILLED_SYRINGE | INTRA_ARTICULAR | Status: AC | PRN
Start: 2020-12-25 — End: 2020-12-25
  Administered 2020-12-25: 16 mg via INTRA_ARTICULAR

## 2020-12-25 NOTE — Progress Notes (Signed)
Office Visit Note   Patient: Dominique Farley           Date of Birth: 1926-06-21           MRN: [id number] Visit Date: 12/25/2020              Requested by: [name], DO [place],  [place] [address] PCP: [name], DO   Assessment & Plan: Visit Diagnoses: No diagnosis found.  Plan:  Patient is a 87 year old woman who comes in today for her first Synvisc injection.  She is doing this and her left knee.  She has had the injections performed on well.  She is accompanied by caregiver  Follow-Up Instructions: No follow-ups on file.   Orders:  No orders of the defined types were placed in this encounter.  No orders of the defined types were placed in this encounter.     Procedures: Large Joint Inj: L knee on 12/25/2020 10:56 AM Indications: pain Details: 25 G 1.5 in needle, anteromedial approach  Arthrogram: No  Medications: 16 mg Hylan 16 MG/2ML Outcome: tolerated well, no immediate complications Procedure, treatment alternatives, risks and benefits explained, specific risks discussed. Consent was given by the patient.      Clinical Data: No additional findings.   Subjective: Chief Complaint  Patient presents with   Left Knee - Follow-up    Synvisc  Patient presents today for the first Synvisc injection in her left knee.    Review of Systems  All other systems reviewed and are negative.   Objective: Vital Signs: There were no vitals taken for this visit.  Physical Exam Patient is sitting comfortably in a chair with her caregiver nearby Ortho Exam Left knee no effusion no redness she does have some chronic bruising which her caregiver says she has off-and-on her knee.  She also has small skin tearing.  No signs of infection Specialty Comments:  No specialty comments available.  Imaging: No results found.   PMFS History: Patient Active Problem List   Diagnosis Date Noted   Acute cystitis with hematuria 11/22/2020    Pneumonia of lower lobe due to infectious organism 11/20/2020   Weakness 11/20/2020   Squamous cell carcinoma in situ (SCCIS) of skin 10/25/2019   Unilateral primary osteoarthritis, left knee 07/12/2019   Bilateral primary osteoarthritis of knee 06/14/2019   Atrial fibrillation ([place]) 11/04/2018   Encounter for monitoring Coumadin therapy 11/04/2018   Cerebrovascular accident (CVA) due to occlusion of left middle cerebral artery ([place]) 10/18/2018   Unilateral primary osteoarthritis, right knee 10/05/2018   Tremor 08/24/2018   Essential hypertension 08/24/2018   Age related osteoporosis 08/24/2018   Past Medical History:  Diagnosis Date   Atrial fibrillation ([place])    BCC (basal cell carcinoma of skin) 11/14/2004   Right Mid Cheek(Cx3,5FU)   BCC (basal cell carcinoma of skin) 04/16/2011   Right Crease(tx p bx)   BCC (basal cell carcinoma of skin) 06/16/2016   Left Nare(tx p bx)   BCC (basal cell carcinoma of skin) Atypical Basaloid 09/29/2018   Left Side Nose(tx p bx)   Hypertension    Osteoporosis    SCC (squamous cell carcinoma) 09/29/2018   Left Jawline(tx p bx)   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   Right Jawline(Cx3,5FU) and Left Upper Cheek(Cx3,5FU)   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   Left Jawline(tx p bx), Nose(tx p bx), Right Cheek(tx p bx), and Right Forearm(tx p bx)   SCC (  squamous cell carcinoma)-Well Diff x 2 05/16/2004   Right Temple(Cx3,5FU) and Left Outer Eye(Cx3,5FU)   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   Right Forearm(Cx3,5FU)   Squamous cell carcinoma in situ (SCCIS) 03/13/2005   Left Side of Nose(Cx3,5FU)   Squamous cell carcinoma in situ (SCCIS) 06/04/2005   Inner Right Cheek/Eye(Tx p Bx)   Squamous cell carcinoma in situ (SCCIS) 02/12/2010   Right Cheek(tx p bx)   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 2 09/29/2018   Upper Lip(tx p bx) and Bridge of Nose(tx p bx)   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 4  08/29/2007   Left Forehead, Left Neck, Right Nose, and Right Hand   Stroke ([place])    Tremor     Family History  Problem Relation Age of Onset   Dementia Mother    Stroke Father    Heart disease Father    Diabetes Sister    Stroke Son     Past Surgical History:  Procedure Laterality Date   CHOLECYSTECTOMY     skin biopsies     Social History   Occupational History   Occupation: Retired  Tobacco Use   Smoking status: Never   Smokeless tobacco: Never  Vaping Use   Vaping Use: Never used  Substance and Sexual Activity   Alcohol use: Never   Drug use: Never   Sexual activity: Not Currently

## 2020-12-27 DIAGNOSIS — R2681 Unsteadiness on feet: Secondary | ICD-10-CM | POA: Diagnosis not present

## 2020-12-27 DIAGNOSIS — M6281 Muscle weakness (generalized): Secondary | ICD-10-CM | POA: Diagnosis not present

## 2020-12-31 ENCOUNTER — Other Ambulatory Visit: Payer: Self-pay | Admitting: Family Medicine

## 2020-12-31 DIAGNOSIS — I4811 Longstanding persistent atrial fibrillation: Secondary | ICD-10-CM

## 2020-12-31 DIAGNOSIS — I63512 Cerebral infarction due to unspecified occlusion or stenosis of left middle cerebral artery: Secondary | ICD-10-CM

## 2021-01-01 ENCOUNTER — Encounter: Payer: Self-pay | Admitting: Orthopaedic Surgery

## 2021-01-01 ENCOUNTER — Ambulatory Visit: Payer: Medicare Other | Admitting: Orthopaedic Surgery

## 2021-01-01 DIAGNOSIS — M1712 Unilateral primary osteoarthritis, left knee: Secondary | ICD-10-CM

## 2021-01-01 NOTE — Progress Notes (Signed)
This patient is diagnosed with osteoarthritis of the knee(s).    Radiographs show evidence of joint space narrowing, osteophytes, subchondral sclerosis and/or subchondral cysts.  This patient has knee pain which interferes with functional and activities of daily living.    This patient has experienced inadequate response, adverse effects and/or intolerance with conservative treatments such as acetaminophen, NSAIDS, topical creams, physical therapy or regular exercise, knee bracing and/or weight loss.   This patient has experienced inadequate response or has a contraindication to intra articular steroid injections for at least 3 months.   This patient is not scheduled to have a total knee replacement within 6 months of starting treatment with viscosupplementation.  PROCEDURE NOTE:  Synvisc injection #  2 of 3   Injection 1 vial of Synvisc into left  knee  There were no vitals taken for this visit.  The left knee exam: there was no synovitis or infection   The knee was prepped sterilely  Ethyl chloride was used to anesthetize the skin A 20 g needle was used to inject the knee with 1 vial of Synvisc A sterile dressing was placed  There were no complications  Follow up one week  Encounter Diagnosis  Name Primary?   Unilateral primary osteoarthritis, left knee Yes   Call if any problem.  Precautions discussed.  Electronically Olmito and Olmito, MD 12/21/202211:13 AM

## 2021-01-02 ENCOUNTER — Encounter: Payer: Self-pay | Admitting: Family Medicine

## 2021-01-02 ENCOUNTER — Ambulatory Visit (INDEPENDENT_AMBULATORY_CARE_PROVIDER_SITE_OTHER): Payer: Medicare Other | Admitting: Family Medicine

## 2021-01-02 VITALS — BP 121/60 | HR 64 | Temp 97.3°F | Ht 60.0 in | Wt 135.0 lb

## 2021-01-02 DIAGNOSIS — I6930 Unspecified sequelae of cerebral infarction: Secondary | ICD-10-CM

## 2021-01-02 DIAGNOSIS — I4811 Longstanding persistent atrial fibrillation: Secondary | ICD-10-CM

## 2021-01-02 DIAGNOSIS — R791 Abnormal coagulation profile: Secondary | ICD-10-CM

## 2021-01-02 DIAGNOSIS — I63512 Cerebral infarction due to unspecified occlusion or stenosis of left middle cerebral artery: Secondary | ICD-10-CM

## 2021-01-02 LAB — POCT INR: INR: 2 (ref 2.0–3.0)

## 2021-01-02 LAB — COAGUCHEK XS/INR WAIVED
INR: 2 — ABNORMAL HIGH (ref 0.9–1.1)
Prothrombin Time: 24.4 s

## 2021-01-02 NOTE — Progress Notes (Signed)
Subjective:  Patient ID: Dominique Farley, female    DOB: 21-Apr-1926, 87 y.o.   MRN: [id number]  Patient Care Team: [name], DO as PCP - General (Family Medicine) Harl Bowie Alphonse Guild, MD as PCP - Cardiology (Cardiology) Warren Danes, PA-C as Physician Assistant (Dermatology) Lavonna Monarch, MD as Consulting Physician (Dermatology)   Chief Complaint:  Coagulation Disorder   HPI: Dominique Farley is a 87 y.o. female presenting on 01/02/2021 for Coagulation Disorder   Patient presents today for recheck of INR, she was supratherapeutic at last visit.  Coumadin dose was held for 2 days and then she resumed 7.5 mg on Sunday, Wednesday, and Friday and 5 mg all other days she is on longstanding anticoagulation due to persistent A-Fib and prior CVA.  She denies any abnormal bleeding or bruising.  No other hemoptysis, hematuria, melena, or hematochezia.  No changes in medication regimen or diet.  She denies chest pain, palpitations, shortness of breath, or inability to complete daily tasks as usual.    Relevant past medical, surgical, family, and social history reviewed and updated as indicated.  Allergies and medications reviewed and updated. Data reviewed: Chart in Epic.   Past Medical History:  Diagnosis Date   Atrial fibrillation ([place])    BCC (basal cell carcinoma of skin) 11/14/2004   Right Mid Cheek(Cx3,5FU)   BCC (basal cell carcinoma of skin) 04/16/2011   Right Crease(tx p bx)   BCC (basal cell carcinoma of skin) 06/16/2016   Left Nare(tx p bx)   BCC (basal cell carcinoma of skin) Atypical Basaloid 09/29/2018   Left Side Nose(tx p bx)   Hypertension    Osteoporosis    SCC (squamous cell carcinoma) 09/29/2018   Left Jawline(tx p bx)   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   Right Jawline(Cx3,5FU) and Left Upper Cheek(Cx3,5FU)   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   Left Jawline(tx p bx), Nose(tx p bx), Right Cheek(tx p bx), and Right Forearm(tx p bx)    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   Right Temple(Cx3,5FU) and Left Outer Eye(Cx3,5FU)   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   Right Forearm(Cx3,5FU)   Squamous cell carcinoma in situ (SCCIS) 03/13/2005   Left Side of Nose(Cx3,5FU)   Squamous cell carcinoma in situ (SCCIS) 06/04/2005   Inner Right Cheek/Eye(Tx p Bx)   Squamous cell carcinoma in situ (SCCIS) 02/12/2010   Right Cheek(tx p bx)   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 2 09/29/2018   Upper Lip(tx p bx) and Bridge of Nose(tx p bx)   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   Left Forehead, Left Neck, Right Nose, and Right Hand   Stroke [facility])    Tremor     Past Surgical History:  Procedure Laterality Date   CHOLECYSTECTOMY     skin biopsies      Social History   Socioeconomic History   Marital status: Widowed    Spouse name: Not on file   Number of children: 3   Years of education: Not on file   Highest education level: Not on file  Occupational History   Occupation: Retired  Tobacco Use   Smoking status: Never   Smokeless tobacco: Never  Vaping Use   Vaping Use: Never used  Substance and Sexual Activity   Alcohol use: Never   Drug use: Never   Sexual activity: Not Currently  Other Topics Concern   Not on file  Social History Narrative   Not on file   Social Determinants of Health   Financial Resource Strain: Not on file  Food Insecurity: Not on file  Transportation Needs: Not on file  Physical Activity: Not on file  Stress: Not on file  Social Connections: Not on file  Intimate Partner Violence: Not on file    Outpatient Encounter Medications as of 01/02/2021  Medication Sig   amLODipine (NORVASC) 5 MG tablet Take 1.5 tablets (7.5 mg total) by mouth daily.   aspirin EC 81 MG tablet Take 81 mg by mouth 3 (three) times a week.   atorvastatin (LIPITOR) 40 MG tablet TAKE 1 TABLET BY MOUTH AT BEDTIME   clobetasol cream (TEMOVATE) 0.05 % APPLY  TO AFFECTED AREAS TWICE DAILY UNTIL PSORIATIC LESION RESOLVED   lisinopril (ZESTRIL) 30 MG tablet Take 1 tablet (30 mg total) by mouth daily. NOTE CHANGE IN DOSE   polyethylene glycol (MIRALAX / GLYCOLAX) 17 g packet Take 17 g by mouth daily.   primidone (MYSOLINE) 50 MG tablet TAKE 1 AND 1/2 TABLETS BY MOUTH THREE TIMES DAILY   propranolol (INDERAL) 20 MG tablet TAKE 1 TABLET BY MOUTH TWICE DAILY   warfarin (COUMADIN) 5 MG tablet Take 1.5 tablets (7.5 mg total) by mouth daily at 4 PM.   No facility-administered encounter medications on file as of 01/02/2021.    Allergies  Allergen Reactions   Codeine Other (See Comments)   Influenza Vaccines    Sulfa Antibiotics Hives    Review of Systems  Constitutional:  Negative for activity change, appetite change, chills, diaphoresis, fatigue, fever and unexpected weight change.  Eyes:  Negative for photophobia and visual disturbance.  Respiratory:  Negative for shortness of breath.   Cardiovascular:  Negative for chest pain, palpitations and leg swelling.  Gastrointestinal:  Negative for anal bleeding and blood in stool.  Genitourinary:  Negative for hematuria.  All other systems reviewed and are negative.      Objective:  BP 121/60    Pulse 64    Temp (!) 97.3 F (36.3 C)    Ht 5' (1.524 m)    Wt 135 lb (61.2 kg)    SpO2 94%    BMI 26.37 kg/m    Wt Readings from Last 3 Encounters:  01/02/21 135 lb (61.2 kg)  12/23/20 136 lb 6.4 oz (61.9 kg)  12/11/20 138 lb (62.6 kg)    Physical Exam Vitals and nursing note reviewed.  Constitutional:      General: She is not in acute distress.    Appearance: Normal appearance. She is ill-appearing (chronically ill). She is not toxic-appearing or diaphoretic.  HENT:     Head: Normocephalic and atraumatic.     Mouth/Throat:     Mouth: Mucous membranes are moist.  Eyes:     Conjunctiva/sclera: Conjunctivae normal.     Pupils: Pupils are equal, round, and reactive to light.  Cardiovascular:      Rate and Rhythm: Normal rate. Rhythm irregularly irregular.     Heart sounds: Normal heart sounds.  Pulmonary:     Effort: Pulmonary effort is normal.     Breath sounds: Normal breath sounds.  Musculoskeletal:     Right lower leg: No edema.     Left lower leg: No edema.  Skin:    General: Skin is warm and dry.     Capillary Refill: Capillary refill takes less than 2 seconds.     Coloration: Skin is not pale.  Neurological:  General: No focal deficit present.     Mental Status: She is alert. Mental status is at baseline.     Gait: Gait abnormal (using walker).  Psychiatric:        Mood and Affect: Mood normal.        Behavior: Behavior normal.        Thought Content: Thought content normal.        Judgment: Judgment normal.    Results for orders placed or performed in visit on 01/02/21  POCT INR  Result Value Ref Range   INR 2.0 2.0 - 3.0       Pertinent labs & imaging results that were available during my care of the patient were reviewed by me and considered in my medical decision making.  Assessment & Plan:  Dominique Farley was seen today for coagulation disorder.  Diagnoses and all orders for this visit:  Supratherapeutic INR Longstanding persistent atrial fibrillation ([place]) Cerebrovascular accident (CVA) due to occlusion of left middle cerebral artery ([facility]) INR 2.0 today, at goal.  Continue current Coumadin regimen, 7.5 mg Sunday, Wednesday, Friday and 5 mg all other days.  We will recheck INR in 2 weeks to make sure staying therapeutic.  If remains therapeutic, can get back to every 6-week INR checks.  -     CoaguChek XS/INR Waived -     POCT INR     Continue all other maintenance medications.  Follow up plan: Return in about 2 weeks (around 01/16/2021) for INR.   Continue healthy lifestyle choices, including diet (rich in fruits, vegetables, and lean proteins, and low in salt and simple carbohydrates) and exercise (at least 30 minutes of moderate physical activity  daily).   The above assessment and management plan was discussed with the patient. The patient verbalized understanding of and has agreed to the management plan. Patient is aware to call the clinic if they develop any new symptoms or if symptoms persist or worsen. Patient is aware when to return to the clinic for a follow-up visit. Patient educated on when it is appropriate to go to the emergency department.   Monia Pouch, FNP-C [facility] [phone]

## 2021-01-03 DIAGNOSIS — M6281 Muscle weakness (generalized): Secondary | ICD-10-CM | POA: Diagnosis not present

## 2021-01-03 DIAGNOSIS — R2681 Unsteadiness on feet: Secondary | ICD-10-CM | POA: Diagnosis not present

## 2021-01-08 ENCOUNTER — Encounter: Payer: Self-pay | Admitting: Orthopaedic Surgery

## 2021-01-08 ENCOUNTER — Other Ambulatory Visit: Payer: Self-pay

## 2021-01-08 ENCOUNTER — Ambulatory Visit (INDEPENDENT_AMBULATORY_CARE_PROVIDER_SITE_OTHER): Payer: Medicare Other | Admitting: Orthopaedic Surgery

## 2021-01-08 DIAGNOSIS — M1712 Unilateral primary osteoarthritis, left knee: Secondary | ICD-10-CM | POA: Diagnosis not present

## 2021-01-08 MED ORDER — HYLAN G-F 20 16 MG/2ML IX SOSY
16.0000 mg | PREFILLED_SYRINGE | INTRA_ARTICULAR | Status: AC | PRN
  Administered 2021-01-08: 16 mg via INTRA_ARTICULAR

## 2021-01-08 MED ORDER — METHYLPREDNISOLONE ACETATE 40 MG/ML IJ SUSP
40.0000 mg | INTRAMUSCULAR | Status: AC | PRN
Start: 2021-01-08 — End: 2021-01-08
  Administered 2021-01-08: 12:00:00 40 mg via INTRA_ARTICULAR

## 2021-01-08 NOTE — Progress Notes (Signed)
Office Visit Note   Patient: Dominique Farley           Date of Birth: 21-Aug-1926           MRN: [id number] Visit Date: 01/08/2021              Requested by: [name], DO [place],  [place] [address] PCP: [name], DO   Assessment & Plan: Visit Diagnoses: No diagnosis found.  Plan: Patient is here for her third and final injection into her left knee she is tolerating the previous 2 without difficulty  Follow-Up Instructions: No follow-ups on file.   Orders:  No orders of the defined types were placed in this encounter.  No orders of the defined types were placed in this encounter.     Procedures: Large Joint Inj: L knee on 01/08/2021 11:31 AM Indications: pain and diagnostic evaluation Details: 25 G 1.5 in needle, anteromedial approach  Arthrogram: No  Medications: 40 mg methylPREDNISolone acetate 40 MG/ML; 16 mg Hylan 16 MG/2ML Outcome: tolerated well, no immediate complications Procedure, treatment alternatives, risks and benefits explained, specific risks discussed. Consent was given by the patient.     Clinical Data: No additional findings.   Subjective: Chief Complaint  Patient presents with   Left Knee - Follow-up    Synvisc Left  Patient presents today for the third Synvisc injection in her left knee.  HPI  Review of Systems   Objective: Vital Signs: There were no vitals taken for this visit.  Physical Exam  Ortho Exam Left knee no effusion no redness no swelling Specialty Comments:  No specialty comments available.  Imaging: No results found.   PMFS History: Patient Active Problem List   Diagnosis Date Noted   Acute cystitis with hematuria 11/22/2020   Pneumonia of lower lobe due to infectious organism 11/20/2020   Weakness 11/20/2020   Squamous cell carcinoma in situ (SCCIS) of skin 10/25/2019   Unilateral primary osteoarthritis, left knee 07/12/2019   Bilateral primary osteoarthritis of knee 06/14/2019    Atrial fibrillation ([place]) 11/04/2018   Encounter for monitoring Coumadin therapy 11/04/2018   Cerebrovascular accident (CVA) due to occlusion of left middle cerebral artery ([place]) 10/18/2018   Unilateral primary osteoarthritis, right knee 10/05/2018   Tremor 08/24/2018   Essential hypertension 08/24/2018   Age related osteoporosis 08/24/2018   Past Medical History:  Diagnosis Date   Atrial fibrillation ([place])    BCC (basal cell carcinoma of skin) 11/14/2004   Right Mid Cheek(Cx3,5FU)   BCC (basal cell carcinoma of skin) 04/16/2011   Right Crease(tx p bx)   BCC (basal cell carcinoma of skin) 06/16/2016   Left Nare(tx p bx)   BCC (basal cell carcinoma of skin) Atypical Basaloid 09/29/2018   Left Side Nose(tx p bx)   Hypertension    Osteoporosis    SCC (squamous cell carcinoma) 09/29/2018   Left Jawline(tx p bx)   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   Right Jawline(Cx3,5FU) and Left Upper Cheek(Cx3,5FU)   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   Left Jawline(tx p bx), Nose(tx p bx), Right Cheek(tx p bx), and Right Forearm(tx p bx)   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   Right Temple(Cx3,5FU) and Left Outer Eye(Cx3,5FU)   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   Right Forearm(Cx3,5FU)   Squamous cell carcinoma in situ (SCCIS) 03/13/2005   Left Side of Nose(Cx3,5FU)   Squamous cell carcinoma in situ (SCCIS) 06/04/2005  Inner Right Cheek/Eye(Tx p Bx)   Squamous cell carcinoma in situ (SCCIS) 02/12/2010   Right Cheek(tx p bx)   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 2 09/29/2018   Upper Lip(tx p bx) and Bridge of Nose(tx p bx)   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   Left Forehead, Left Neck, Right Nose, and Right Hand   Stroke ([place])    Tremor     Family History  Problem Relation Age of Onset   Dementia Mother    Stroke Father    Heart disease Father    Diabetes Sister    Stroke Son     Past Surgical  History:  Procedure Laterality Date   CHOLECYSTECTOMY     skin biopsies     Social History   Occupational History   Occupation: Retired  Tobacco Use   Smoking status: Never   Smokeless tobacco: Never  Vaping Use   Vaping Use: Never used  Substance and Sexual Activity   Alcohol use: Never   Drug use: Never   Sexual activity: Not Currently

## 2021-01-14 ENCOUNTER — Ambulatory Visit: Payer: Medicare Other | Admitting: Physician Assistant

## 2021-01-15 ENCOUNTER — Ambulatory Visit: Payer: Medicare Other | Admitting: Family Medicine

## 2021-01-16 ENCOUNTER — Ambulatory Visit (INDEPENDENT_AMBULATORY_CARE_PROVIDER_SITE_OTHER): Payer: Medicare Other | Admitting: Family Medicine

## 2021-01-16 ENCOUNTER — Encounter: Payer: Self-pay | Admitting: Family Medicine

## 2021-01-16 VITALS — BP 145/70 | HR 53 | Temp 98.0°F | Ht 60.0 in | Wt 135.0 lb

## 2021-01-16 DIAGNOSIS — Z7901 Long term (current) use of anticoagulants: Secondary | ICD-10-CM

## 2021-01-16 DIAGNOSIS — I6930 Unspecified sequelae of cerebral infarction: Secondary | ICD-10-CM

## 2021-01-16 DIAGNOSIS — I4811 Longstanding persistent atrial fibrillation: Secondary | ICD-10-CM

## 2021-01-16 DIAGNOSIS — M79671 Pain in right foot: Secondary | ICD-10-CM | POA: Diagnosis not present

## 2021-01-16 DIAGNOSIS — M79672 Pain in left foot: Secondary | ICD-10-CM | POA: Diagnosis not present

## 2021-01-16 DIAGNOSIS — I63512 Cerebral infarction due to unspecified occlusion or stenosis of left middle cerebral artery: Secondary | ICD-10-CM

## 2021-01-16 DIAGNOSIS — I739 Peripheral vascular disease, unspecified: Secondary | ICD-10-CM | POA: Diagnosis not present

## 2021-01-16 DIAGNOSIS — L110 Acquired keratosis follicularis: Secondary | ICD-10-CM | POA: Diagnosis not present

## 2021-01-16 LAB — POCT INR: INR: 1.3 — AB (ref 2.0–3.0)

## 2021-01-16 LAB — COAGUCHEK XS/INR WAIVED
INR: 1.3 — ABNORMAL HIGH (ref 0.9–1.1)
Prothrombin Time: 15.3 s

## 2021-01-16 NOTE — Progress Notes (Signed)
Subjective:  Patient ID: Dominique Farley, female    DOB: [birth year], 88 y.o.   MRN: [id number]  Patient Care Team: [name], DO as PCP - General (Family Medicine) Harl Bowie Alphonse Guild, MD as PCP - Cardiology (Cardiology) Warren Danes, PA-C as Physician Assistant (Dermatology) Lavonna Monarch, MD as Consulting Physician (Dermatology)   Chief Complaint:  Coagulation Disorder and coagulation check   HPI: Dominique Farley is a 88 y.o. female presenting on 01/16/2021 for Coagulation Disorder and coagulation check   Pt here for coumadin management. She is on anticoagulation for longstanding A-Fib and prior CVA. She states A-Fib is well controlled, no palpitations, chest pain, shortness of breath, or dizziness. No recurrent focal neurological deficits. INR was 2.0 at last visit and regimen was not changed. No new medications or dietary changes. No abnormal bleeding or bruising.       Relevant past medical, surgical, family, and social history reviewed and updated as indicated.  Allergies and medications reviewed and updated. Data reviewed: Chart in Epic.   Past Medical History:  Diagnosis Date   Atrial fibrillation ([place])    BCC (basal cell carcinoma of skin) 11/14/2004   Right Mid Cheek(Cx3,5FU)   BCC (basal cell carcinoma of skin) 04/16/2011   Right Crease(tx p bx)   BCC (basal cell carcinoma of skin) 06/16/2016   Left Nare(tx p bx)   BCC (basal cell carcinoma of skin) Atypical Basaloid 09/29/2018   Left Side Nose(tx p bx)   Hypertension    Osteoporosis    SCC (squamous cell carcinoma) 09/29/2018   Left Jawline(tx p bx)   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   Right Jawline(Cx3,5FU) and Left Upper Cheek(Cx3,5FU)   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   Left Jawline(tx p bx), Nose(tx p bx), Right Cheek(tx p bx), and Right Forearm(tx p bx)   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   Right Temple(Cx3,5FU) and Left Outer Eye(Cx3,5FU)    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   Right Forearm(Cx3,5FU)   Squamous cell carcinoma in situ (SCCIS) 03/13/2005   Left Side of Nose(Cx3,5FU)   Squamous cell carcinoma in situ (SCCIS) 06/04/2005   Inner Right Cheek/Eye(Tx p Bx)   Squamous cell carcinoma in situ (SCCIS) 02/12/2010   Right Cheek(tx p bx)   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 2 09/29/2018   Upper Lip(tx p bx) and Bridge of Nose(tx p bx)   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   Left Forehead, Left Neck, Right Nose, and Right Hand   Stroke [facility])    Tremor     Past Surgical History:  Procedure Laterality Date   CHOLECYSTECTOMY     skin biopsies      Social History   Socioeconomic History   Marital status: Widowed    Spouse name: Not on file   Number of children: 3   Years of education: Not on file   Highest education level: Not on file  Occupational History   Occupation: Retired  Tobacco Use   Smoking status: Never   Smokeless tobacco: Never  Vaping Use   Vaping Use: Never used  Substance and Sexual Activity   Alcohol use: Never   Drug use: Never   Sexual activity: Not Currently  Other Topics Concern   Not on file  Social History Narrative   Not on file   Social Determinants of Health   Financial Resource Strain: Not on file  Food  Insecurity: Not on file  Transportation Needs: Not on file  Physical Activity: Not on file  Stress: Not on file  Social Connections: Not on file  Intimate Partner Violence: Not on file    Outpatient Encounter Medications as of 01/16/2021  Medication Sig   amLODipine (NORVASC) 5 MG tablet Take 1.5 tablets (7.5 mg total) by mouth daily.   aspirin EC 81 MG tablet Take 81 mg by mouth 3 (three) times a week.   atorvastatin (LIPITOR) 40 MG tablet TAKE 1 TABLET BY MOUTH AT BEDTIME   clobetasol cream (TEMOVATE) 0.05 % APPLY TO AFFECTED AREAS TWICE DAILY UNTIL PSORIATIC LESION RESOLVED   lisinopril (ZESTRIL) 30 MG tablet Take 1 tablet (30  mg total) by mouth daily. NOTE CHANGE IN DOSE   polyethylene glycol (MIRALAX / GLYCOLAX) 17 g packet Take 17 g by mouth daily.   primidone (MYSOLINE) 50 MG tablet TAKE 1 AND 1/2 TABLETS BY MOUTH THREE TIMES DAILY   propranolol (INDERAL) 20 MG tablet TAKE 1 TABLET BY MOUTH TWICE DAILY   warfarin (COUMADIN) 5 MG tablet Take 1.5 tablets (7.5 mg total) by mouth daily at 4 PM.   No facility-administered encounter medications on file as of 01/16/2021.    Allergies  Allergen Reactions   Codeine Other (See Comments)   Influenza Vaccines    Sulfa Antibiotics Hives    Review of Systems  Constitutional:  Negative for activity change, appetite change, chills, diaphoresis, fatigue, fever and unexpected weight change.  Respiratory:  Negative for cough, chest tightness and shortness of breath.   Cardiovascular:  Negative for chest pain, palpitations and leg swelling.  Gastrointestinal:  Negative for anal bleeding and blood in stool.  Genitourinary:  Negative for hematuria and vaginal bleeding.  Skin:  Negative for color change and pallor.  Neurological:  Negative for dizziness, tremors, seizures, facial asymmetry, speech difficulty, weakness, light-headedness, numbness and headaches.  Psychiatric/Behavioral:  Negative for confusion.   All other systems reviewed and are negative.      Objective:  BP (!) 145/70    Pulse (!) 53    Temp 98 F (36.7 C)    Ht 5' (1.524 m)    Wt 135 lb (61.2 kg)    SpO2 96%    BMI 26.37 kg/m    Wt Readings from Last 3 Encounters:  01/16/21 135 lb (61.2 kg)  01/02/21 135 lb (61.2 kg)  12/23/20 136 lb 6.4 oz (61.9 kg)    Physical Exam Vitals and nursing note reviewed.  Constitutional:      General: She is not in acute distress.    Appearance: Normal appearance. She is not ill-appearing, toxic-appearing or diaphoretic.  HENT:     Head: Normocephalic and atraumatic.     Mouth/Throat:     Mouth: Mucous membranes are moist.  Eyes:     Conjunctiva/sclera:  Conjunctivae normal.     Pupils: Pupils are equal, round, and reactive to light.  Cardiovascular:     Rate and Rhythm: Normal rate. Rhythm irregularly irregular.     Heart sounds: No murmur heard. Pulmonary:     Effort: Pulmonary effort is normal. No respiratory distress.     Breath sounds: Normal breath sounds.  Musculoskeletal:     Right lower leg: No edema.     Left lower leg: No edema.  Skin:    General: Skin is warm and dry.     Capillary Refill: Capillary refill takes less than 2 seconds.     Findings: No bruising.  Neurological:  Mental Status: She is alert and oriented to person, place, and time. Mental status is at baseline.     Gait: Gait abnormal (using cane).  Psychiatric:        Mood and Affect: Mood normal.        Behavior: Behavior normal.    Results for orders placed or performed in visit on 01/16/21  POCT INR  Result Value Ref Range   INR 1.3 (A) 2.0 - 3.0       Pertinent labs & imaging results that were available during my care of the patient were reviewed by me and considered in my medical decision making.  Assessment & Plan:  Bruce was seen today for coagulation disorder and coagulation check.  Diagnoses and all orders for this visit:  Chronic anticoagulation Longstanding persistent atrial fibrillation ([place]) Cerebrovascular accident (CVA) due to occlusion of left middle cerebral artery (HCC) INR 1.3 today. Dose adjustments made and explained to pt and family. Anticoagulation calendar with daily dosing provided. Follow up in 1 week for reevaluation.  -     CoaguChek XS/INR Waived -     POCT INR     Continue all other maintenance medications.  Follow up plan: Return in about 1 week (around 01/23/2021) for INR.   Continue healthy lifestyle choices, including diet (rich in fruits, vegetables, and lean proteins, and low in salt and simple carbohydrates) and exercise (at least 30 minutes of moderate physical activity daily).  Educational handout  given for anticoagulation calendar  The above assessment and management plan was discussed with the patient. The patient verbalized understanding of and has agreed to the management plan. Patient is aware to call the clinic if they develop any new symptoms or if symptoms persist or worsen. Patient is aware when to return to the clinic for a follow-up visit. Patient educated on when it is appropriate to go to the emergency department.   Monia Pouch, FNP-C [facility] [phone]

## 2021-01-24 ENCOUNTER — Encounter: Payer: Self-pay | Admitting: Family Medicine

## 2021-01-24 ENCOUNTER — Ambulatory Visit (INDEPENDENT_AMBULATORY_CARE_PROVIDER_SITE_OTHER): Payer: Medicare Other | Admitting: Family Medicine

## 2021-01-24 VITALS — BP 158/78 | HR 53 | Temp 98.2°F | Ht 60.0 in | Wt 133.0 lb

## 2021-01-24 DIAGNOSIS — Z7901 Long term (current) use of anticoagulants: Secondary | ICD-10-CM | POA: Insufficient documentation

## 2021-01-24 DIAGNOSIS — R634 Abnormal weight loss: Secondary | ICD-10-CM

## 2021-01-24 DIAGNOSIS — I4811 Longstanding persistent atrial fibrillation: Secondary | ICD-10-CM

## 2021-01-24 LAB — COAGUCHEK XS/INR WAIVED
INR: 2.7 — ABNORMAL HIGH (ref 0.9–1.1)
Prothrombin Time: 31.8 s

## 2021-01-24 LAB — POCT INR: INR: 2.7 (ref 2.0–3.0)

## 2021-01-24 NOTE — Progress Notes (Signed)
Subjective:  Patient ID: Dominique Farley, female    DOB: 1927/01/05, 88 y.o.   MRN: [id number]  Patient Care Team: [name], DO as PCP - General (Family Medicine) Harl Bowie Alphonse Guild, MD as PCP - Cardiology (Cardiology) Warren Danes, PA-C as Physician Assistant (Dermatology) Lavonna Monarch, MD as Consulting Physician (Dermatology)   Chief Complaint:  Anticoagulation   HPI: Dominique Farley is a 88 y.o. female presenting on 01/24/2021 for Anticoagulation   Pt presents today for INR recheck. INR was 1.3 at last visit and regimen was adjusted slightly as pt is very sensitive to dose changes. She has been tolerating dose adjustment well without abnormal bleeding or bruising. Her A-Fib is well controlled and she denies any chest pain, shortness of breath, palpitations, dizziness, or syncope. No lower extremity swelling.  Daughter is concerned about weight loss. States pt does not eat her evening meals. She has lost 2 lbs since last visit. Pt denies any fatigue, night sweats, or pain.       Relevant past medical, surgical, family, and social history reviewed and updated as indicated.  Allergies and medications reviewed and updated. Data reviewed: Chart in Epic.   Past Medical History:  Diagnosis Date   Atrial fibrillation ([place])    BCC (basal cell carcinoma of skin) 11/14/2004   Right Mid Cheek(Cx3,5FU)   BCC (basal cell carcinoma of skin) 04/16/2011   Right Crease(tx p bx)   BCC (basal cell carcinoma of skin) 06/16/2016   Left Nare(tx p bx)   BCC (basal cell carcinoma of skin) Atypical Basaloid 09/29/2018   Left Side Nose(tx p bx)   Hypertension    Osteoporosis    SCC (squamous cell carcinoma) 09/29/2018   Left Jawline(tx p bx)   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   Right Jawline(Cx3,5FU) and Left Upper Cheek(Cx3,5FU)   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   Left Jawline(tx p bx), Nose(tx p bx), Right Cheek(tx p bx), and Right Forearm(tx p bx)   SCC  (squamous cell carcinoma)-Well Diff x 2 05/16/2004   Right Temple(Cx3,5FU) and Left Outer Eye(Cx3,5FU)   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   Right Forearm(Cx3,5FU)   Squamous cell carcinoma in situ (SCCIS) 03/13/2005   Left Side of Nose(Cx3,5FU)   Squamous cell carcinoma in situ (SCCIS) 06/04/2005   Inner Right Cheek/Eye(Tx p Bx)   Squamous cell carcinoma in situ (SCCIS) 02/12/2010   Right Cheek(tx p bx)   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 2 09/29/2018   Upper Lip(tx p bx) and Bridge of Nose(tx p bx)   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   Left Forehead, Left Neck, Right Nose, and Right Hand   Stroke [facility])    Tremor     Past Surgical History:  Procedure Laterality Date   CHOLECYSTECTOMY     skin biopsies      Social History   Socioeconomic History   Marital status: Widowed    Spouse name: Not on file   Number of children: 3   Years of education: Not on file   Highest education level: Not on file  Occupational History   Occupation: Retired  Tobacco Use   Smoking status: Never   Smokeless tobacco: Never  Vaping Use   Vaping Use: Never used  Substance and Sexual Activity   Alcohol use: Never   Drug use: Never   Sexual activity: Not Currently  Other Topics Concern   Not on file  Social History Narrative   Not on file   Social Determinants of Health   Financial Resource Strain: Not on file  Food Insecurity: Not on file  Transportation Needs: Not on file  Physical Activity: Not on file  Stress: Not on file  Social Connections: Not on file  Intimate Partner Violence: Not on file    Outpatient Encounter Medications as of 01/24/2021  Medication Sig   amLODipine (NORVASC) 5 MG tablet Take 1.5 tablets (7.5 mg total) by mouth daily.   aspirin EC 81 MG tablet Take 81 mg by mouth 3 (three) times a week.   atorvastatin (LIPITOR) 40 MG tablet TAKE 1 TABLET BY MOUTH AT BEDTIME   clobetasol cream (TEMOVATE) 0.05 % APPLY TO  AFFECTED AREAS TWICE DAILY UNTIL PSORIATIC LESION RESOLVED   lisinopril (ZESTRIL) 30 MG tablet Take 1 tablet (30 mg total) by mouth daily. NOTE CHANGE IN DOSE   polyethylene glycol (MIRALAX / GLYCOLAX) 17 g packet Take 17 g by mouth daily.   primidone (MYSOLINE) 50 MG tablet TAKE 1 AND 1/2 TABLETS BY MOUTH THREE TIMES DAILY   propranolol (INDERAL) 20 MG tablet TAKE 1 TABLET BY MOUTH TWICE DAILY   warfarin (COUMADIN) 5 MG tablet Take 1.5 tablets (7.5 mg total) by mouth daily at 4 PM.   No facility-administered encounter medications on file as of 01/24/2021.    Allergies  Allergen Reactions   Codeine Other (See Comments)   Influenza Vaccines    Sulfa Antibiotics Hives    Review of Systems  Constitutional:  Negative for activity change, appetite change, chills, diaphoresis, fatigue, fever and unexpected weight change.  Eyes:  Negative for photophobia and visual disturbance.  Respiratory:  Negative for cough and shortness of breath.   Cardiovascular:  Negative for chest pain, palpitations and leg swelling.  Gastrointestinal:  Negative for abdominal pain, anal bleeding and blood in stool.  Genitourinary:  Negative for decreased urine volume, difficulty urinating, hematuria and vaginal bleeding.  Skin:  Negative for color change and pallor.  Neurological:  Negative for dizziness, tremors, seizures, syncope, facial asymmetry, speech difficulty, weakness, light-headedness, numbness and headaches.  Psychiatric/Behavioral:  Negative for confusion.   All other systems reviewed and are negative.      Objective:  BP (!) 158/78    Pulse (!) 53    Temp 98.2 F (36.8 C)    Ht 5' (1.524 m)    Wt 133 lb (60.3 kg)    SpO2 96%    BMI 25.97 kg/m    Wt Readings from Last 3 Encounters:  01/24/21 133 lb (60.3 kg)  01/16/21 135 lb (61.2 kg)  01/02/21 135 lb (61.2 kg)    Physical Exam Vitals and nursing note reviewed.  Constitutional:      General: She is not in acute distress.    Appearance: She  is normal weight. She is not ill-appearing, toxic-appearing or diaphoretic.     Comments: Frail elderly  HENT:     Head: Normocephalic and atraumatic.     Mouth/Throat:     Mouth: Mucous membranes are moist.  Eyes:     Conjunctiva/sclera: Conjunctivae normal.     Pupils: Pupils are equal, round, and reactive to light.  Cardiovascular:     Rate and Rhythm: Normal rate. Rhythm irregularly irregular.     Heart sounds: Normal heart sounds.  Pulmonary:     Effort: Pulmonary effort is normal.     Breath sounds: Normal breath sounds.  Abdominal:     General: Bowel sounds  are normal.     Palpations: Abdomen is soft.  Musculoskeletal:     Right lower leg: No edema.     Left lower leg: No edema.  Skin:    General: Skin is warm and dry.     Capillary Refill: Capillary refill takes less than 2 seconds.  Neurological:     General: No focal deficit present.     Mental Status: She is alert. Mental status is at baseline.     Gait: Gait abnormal (using cane).  Psychiatric:        Mood and Affect: Mood normal.        Behavior: Behavior normal.        Thought Content: Thought content normal.        Judgment: Judgment normal.    Results for orders placed or performed in visit on 01/24/21  POCT INR  Result Value Ref Range   INR 2.7 2.0 - 3.0       Pertinent labs & imaging results that were available during my care of the patient were reviewed by me and considered in my medical decision making.  Assessment & Plan:  Dominique Farley was seen today for anticoagulation.  Diagnoses and all orders for this visit:  Chronic anticoagulation Longstanding persistent atrial fibrillation (HCC) INR 2.7 today, at goal, continue current regimen.        -    CoaguChek XS/INR Waived -     POCT INR  Weight loss Has lost weight since last visit. Daughter states pt not eating her evening meals. Discussed adding an Ensure or Boost in the evenings. If weight continues to drop at next visit, will investigate for  potential underlying causes. Pt and daughter aware.     Continue all other maintenance medications.  Follow up plan: Return in about 4 weeks (around 02/21/2021), or if symptoms worsen or fail to improve, for INR.   Continue healthy lifestyle choices, including diet (rich in fruits, vegetables, and lean proteins, and low in salt and simple carbohydrates) and exercise (at least 30 minutes of moderate physical activity daily).  Educational handout given for anticoag calendar   The above assessment and management plan was discussed with the patient. The patient verbalized understanding of and has agreed to the management plan. Patient is aware to call the clinic if they develop any new symptoms or if symptoms persist or worsen. Patient is aware when to return to the clinic for a follow-up visit. Patient educated on when it is appropriate to go to the emergency department.   Monia Pouch, FNP-C [facility] [phone]

## 2021-01-28 ENCOUNTER — Other Ambulatory Visit: Payer: Self-pay | Admitting: Family Medicine

## 2021-01-28 DIAGNOSIS — G250 Essential tremor: Secondary | ICD-10-CM

## 2021-01-28 DIAGNOSIS — I4811 Longstanding persistent atrial fibrillation: Secondary | ICD-10-CM

## 2021-01-28 DIAGNOSIS — I63512 Cerebral infarction due to unspecified occlusion or stenosis of left middle cerebral artery: Secondary | ICD-10-CM

## 2021-02-11 ENCOUNTER — Other Ambulatory Visit: Payer: Self-pay

## 2021-02-11 DIAGNOSIS — I4811 Longstanding persistent atrial fibrillation: Secondary | ICD-10-CM

## 2021-02-11 DIAGNOSIS — I63512 Cerebral infarction due to unspecified occlusion or stenosis of left middle cerebral artery: Secondary | ICD-10-CM

## 2021-02-11 MED ORDER — WARFARIN SODIUM 5 MG PO TABS: 7.5000 mg | ORAL_TABLET | Freq: Every day | ORAL | 3 refills | Status: DC

## 2021-02-28 ENCOUNTER — Encounter: Payer: Self-pay | Admitting: Family Medicine

## 2021-02-28 ENCOUNTER — Ambulatory Visit (INDEPENDENT_AMBULATORY_CARE_PROVIDER_SITE_OTHER): Payer: Medicare Other | Admitting: Family Medicine

## 2021-02-28 VITALS — BP 132/80 | HR 78 | Temp 97.6°F | Ht 60.0 in | Wt 138.0 lb

## 2021-02-28 DIAGNOSIS — I4811 Longstanding persistent atrial fibrillation: Secondary | ICD-10-CM

## 2021-02-28 DIAGNOSIS — Z7901 Long term (current) use of anticoagulants: Secondary | ICD-10-CM | POA: Diagnosis not present

## 2021-02-28 LAB — POCT INR: INR: 1.4 — AB (ref 2.0–3.0)

## 2021-02-28 LAB — COAGUCHEK XS/INR WAIVED
INR: 1.4 — ABNORMAL HIGH (ref 0.9–1.1)
Prothrombin Time: 17.4 s

## 2021-02-28 NOTE — Progress Notes (Signed)
Subjective:  Patient ID: Dominique Farley, female    DOB: 12-Nov-1926, 88 y.o.   MRN: [id number]  Patient Care Team: [name], DO as PCP - General (Family Medicine) Harl Bowie Alphonse Guild, MD as PCP - Cardiology (Cardiology) Warren Danes, PA-C as Physician Assistant (Dermatology) Lavonna Monarch, MD as Consulting Physician (Dermatology)   Chief Complaint:  Anticoagulation   HPI: Dominique Farley is a 88 y.o. female presenting on 02/28/2021 for Anticoagulation   Pt presents today for INR check. She has persistent A-Fib and CVA history. Last INR 01/24/2021 was normal and no adjustments were mae to regimen. She denies changes in diet or medications since last visit. No palpitations, chest pain, abnormal bleeding or bruising, shortness of breath, leg swelling, pallor, weakness, confusion, or recent falls.    Relevant past medical, surgical, family, and social history reviewed and updated as indicated.  Allergies and medications reviewed and updated. Data reviewed: Chart in Epic.   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   Left Jawline(tx p bx), Nose(tx p bx), Right Cheek(tx p bx), and Right Forearm(tx p bx)   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   Right Temple(Cx3,5FU) and Left Outer Eye(Cx3,5FU)   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   Right Forearm(Cx3,5FU)    Squamous cell carcinoma in situ (SCCIS) 03/13/2005   Left Side of Nose(Cx3,5FU)   Squamous cell carcinoma in situ (SCCIS) 06/04/2005   Inner Right Cheek/Eye(Tx p Bx)   Squamous cell carcinoma in situ (SCCIS) 02/12/2010   Right Cheek(tx p bx)   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)    Tremor     Past Surgical History:  Procedure Laterality Date   CHOLECYSTECTOMY     skin biopsies      Social History   Socioeconomic History   Marital status: Widowed    Spouse name: Not on file   Number of children: 3   Years of education: Not on file   Highest education level: Not on file  Occupational History   Occupation: Retired  Tobacco Use   Smoking status: Never   Smokeless tobacco: Never  Vaping Use   Vaping Use: Never used  Substance and Sexual Activity   Alcohol use: Never   Drug use: Never   Sexual activity: Not Currently  Other Topics Concern   Not on file  Social History Narrative   Not on file   Social Determinants of Health   Financial Resource Strain: Not on file  Food Insecurity: Not on file  Transportation Needs: Not on file  Physical Activity: Not  on file  Stress: Not on file  Social Connections: Not on file  Intimate Partner Violence: Not on file    Outpatient Encounter Medications as of 02/28/2021  Medication Sig   amLODipine (NORVASC) 5 MG tablet Take 1.5 tablets (7.5 mg total) by mouth daily.   aspirin EC 81 MG tablet Take 81 mg by mouth 3 (three) times a week.   atorvastatin (LIPITOR) 40 MG tablet TAKE 1 TABLET BY MOUTH AT BEDTIME   clobetasol cream (TEMOVATE) 0.05 % APPLY TO AFFECTED AREAS TWICE DAILY UNTIL PSORIATIC LESION RESOLVED   lisinopril (ZESTRIL) 30 MG tablet Take 1 tablet (30 mg total) by mouth daily. NOTE CHANGE IN DOSE   polyethylene glycol (MIRALAX  / GLYCOLAX) 17 g packet Take 17 g by mouth daily.   primidone (MYSOLINE) 50 MG tablet TAKE 1 AND 1/2 TABLETS BY MOUTH THREE TIMES DAILY   propranolol (INDERAL) 20 MG tablet TAKE 1 TABLET BY MOUTH TWICE DAILY   warfarin (COUMADIN) 5 MG tablet Take 1.5 tablets (7.5 mg total) by mouth daily at 4 PM. On Monday, Tuesday, and Saturday take 1 tablet.   No facility-administered encounter medications on file as of 02/28/2021.    Allergies  Allergen Reactions   Codeine Other (See Comments)   Influenza Vaccines    Sulfa Antibiotics Hives    Review of Systems  Constitutional:  Negative for activity change, appetite change, chills, diaphoresis, fatigue, fever and unexpected weight change.  Respiratory:  Negative for cough and shortness of breath.   Cardiovascular:  Negative for chest pain, palpitations and leg swelling.  Gastrointestinal:  Negative for anal bleeding.  Genitourinary:  Negative for hematuria.  Skin:  Negative for pallor.  Neurological:  Negative for dizziness, tremors, seizures, syncope, facial asymmetry, speech difficulty, weakness, light-headedness, numbness and headaches.  Hematological:  Does not bruise/bleed easily.  Psychiatric/Behavioral:  Negative for confusion.   All other systems reviewed and are negative.      Objective:  BP 132/80    Pulse 78    Temp 97.6 F (36.4 C) (Temporal)    Ht 5' (1.524 m)    Wt 138 lb (62.6 kg)    SpO2 96%    BMI 26.95 kg/m    Wt Readings from Last 3 Encounters:  02/28/21 138 lb (62.6 kg)  01/24/21 133 lb (60.3 kg)  01/16/21 135 lb (61.2 kg)    Physical Exam Vitals and nursing note reviewed.  Constitutional:      Comments: Frail elderly  HENT:     Head: Normocephalic and atraumatic.  Eyes:     Conjunctiva/sclera: Conjunctivae normal.     Pupils: Pupils are equal, round, and reactive to light.  Cardiovascular:     Rate and Rhythm: Normal rate. Rhythm regularly irregular.     Heart sounds: Normal heart sounds.  Pulmonary:      Effort: Pulmonary effort is normal.     Breath sounds: Normal breath sounds.  Musculoskeletal:     Right lower leg: No edema.     Left lower leg: No edema.  Skin:    General: Skin is warm and dry.     Capillary Refill: Capillary refill takes less than 2 seconds.  Neurological:     General: No focal deficit present.     Mental Status: She is alert and oriented to person, place, and time. Mental status is at baseline.     Gait: Gait abnormal (uses cane).  Psychiatric:        Mood and Affect: Mood normal.  Behavior: Behavior normal.        Thought Content: Thought content normal.        Judgment: Judgment normal.    Results for orders placed or performed in visit on 02/28/21  CoaguChek XS/INR Waived  Result Value Ref Range   INR 1.4 (H) 0.9 - 1.1   Prothrombin Time 17.4 sec  POCT INR  Result Value Ref Range   INR 1.4 (A) 2.0 - 3.0       Pertinent labs & imaging results that were available during my care of the patient were reviewed by me and considered in my medical decision making.  Assessment & Plan:  Bula was seen today for anticoagulation.  Diagnoses and all orders for this visit:  Longstanding persistent atrial fibrillation (HCC) Chronic anticoagulation INR not at goal today, 1.4. slight adjustment to regimen as pt is very sensitive to dose changes. Will repeat INR in one week. -     CoaguChek XS/INR Waived -     POCT INR     Continue all other maintenance medications.  Follow up plan: Return in about 1 week (around 03/07/2021) for INR.   Continue healthy lifestyle choices, including diet (rich in fruits, vegetables, and lean proteins, and low in salt and simple carbohydrates) and exercise (at least 30 minutes of moderate physical activity daily).  Educational handout given for anticoagulation calendar  The above assessment and management plan was discussed with the patient. The patient verbalized understanding of and has agreed to the management plan.  Patient is aware to call the clinic if they develop any new symptoms or if symptoms persist or worsen. Patient is aware when to return to the clinic for a follow-up visit. Patient educated on when it is appropriate to go to the emergency department.   Monia Pouch, FNP-C [facility] [phone]

## 2021-03-02 ENCOUNTER — Other Ambulatory Visit: Payer: Self-pay | Admitting: Family Medicine

## 2021-03-02 DIAGNOSIS — I63512 Cerebral infarction due to unspecified occlusion or stenosis of left middle cerebral artery: Secondary | ICD-10-CM

## 2021-03-02 DIAGNOSIS — I4811 Longstanding persistent atrial fibrillation: Secondary | ICD-10-CM

## 2021-03-02 DIAGNOSIS — L409 Psoriasis, unspecified: Secondary | ICD-10-CM

## 2021-03-06 ENCOUNTER — Encounter: Payer: Self-pay | Admitting: Family Medicine

## 2021-03-06 ENCOUNTER — Ambulatory Visit (INDEPENDENT_AMBULATORY_CARE_PROVIDER_SITE_OTHER): Payer: Medicare Other | Admitting: Family Medicine

## 2021-03-06 VITALS — BP 150/77 | HR 56 | Temp 97.4°F | Ht 60.0 in | Wt 138.0 lb

## 2021-03-06 DIAGNOSIS — Z7901 Long term (current) use of anticoagulants: Secondary | ICD-10-CM

## 2021-03-06 DIAGNOSIS — I6930 Unspecified sequelae of cerebral infarction: Secondary | ICD-10-CM | POA: Diagnosis not present

## 2021-03-06 DIAGNOSIS — I63512 Cerebral infarction due to unspecified occlusion or stenosis of left middle cerebral artery: Secondary | ICD-10-CM

## 2021-03-06 DIAGNOSIS — I4811 Longstanding persistent atrial fibrillation: Secondary | ICD-10-CM | POA: Diagnosis not present

## 2021-03-06 LAB — POCT INR
INR: 1.4 — AB (ref 2.0–3.0)
INR: 1.4 — AB (ref 2.0–3.0)

## 2021-03-06 LAB — COAGUCHEK XS/INR WAIVED
INR: 1.4 — ABNORMAL HIGH (ref 0.9–1.1)
Prothrombin Time: 17.3 s

## 2021-03-06 NOTE — Progress Notes (Signed)
Subjective:  Patient ID: Dominique Farley, female    DOB: 1926/06/07, 88 y.o.   MRN: [id number]  Patient Care Team: [name], DO as PCP - General (Family Medicine) Harl Bowie Alphonse Guild, MD as PCP - Cardiology (Cardiology) Warren Danes, PA-C as Physician Assistant (Dermatology) Lavonna Monarch, MD as Consulting Physician (Dermatology)   Chief Complaint:  Anticoagulation   HPI: Dominique Farley is a 88 y.o. female presenting on 03/06/2021 for Anticoagulation   Pt presents for INR check, previous INR 1.4. INR 1.4 again today.  She denies dietary changes or recent antibiotic therapy. She has been taking as prescribed. No abnormal bleeding or bruising. No leg swelling, cough, chest pain, shortness of breath, palpitations, dizziness, confusion, weakness or syncope.    Relevant past medical, surgical, family, and social history reviewed and updated as indicated.  Allergies and medications reviewed and updated. Data reviewed: Chart in Epic.   Past Medical History:  Diagnosis Date   Atrial fibrillation ([place])    BCC (basal cell carcinoma of skin) 11/14/2004   Right Mid Cheek(Cx3,5FU)   BCC (basal cell carcinoma of skin) 04/16/2011   Right Crease(tx p bx)   BCC (basal cell carcinoma of skin) 06/16/2016   Left Nare(tx p bx)   BCC (basal cell carcinoma of skin) Atypical Basaloid 09/29/2018   Left Side Nose(tx p bx)   Hypertension    Osteoporosis    SCC (squamous cell carcinoma) 09/29/2018   Left Jawline(tx p bx)   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   Right Jawline(Cx3,5FU) and Left Upper Cheek(Cx3,5FU)   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   Left Jawline(tx p bx), Nose(tx p bx), Right Cheek(tx p bx), and Right Forearm(tx p bx)   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   Right Temple(Cx3,5FU) and Left Outer Eye(Cx3,5FU)   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   Right Forearm(Cx3,5FU)   Squamous cell carcinoma in situ (SCCIS)  03/13/2005   Left Side of Nose(Cx3,5FU)   Squamous cell carcinoma in situ (SCCIS) 06/04/2005   Inner Right Cheek/Eye(Tx p Bx)   Squamous cell carcinoma in situ (SCCIS) 02/12/2010   Right Cheek(tx p bx)   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 2 09/29/2018   Upper Lip(tx p bx) and Bridge of Nose(tx p bx)   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   Left Forehead, Left Neck, Right Nose, and Right Hand   Stroke [facility])    Tremor     Past Surgical History:  Procedure Laterality Date   CHOLECYSTECTOMY     skin biopsies      Social History   Socioeconomic History   Marital status: Widowed    Spouse name: Not on file   Number of children: 3   Years of education: Not on file   Highest education level: Not on file  Occupational History   Occupation: Retired  Tobacco Use   Smoking status: Never   Smokeless tobacco: Never  Vaping Use   Vaping Use: Never used  Substance and Sexual Activity   Alcohol use: Never   Drug use: Never   Sexual activity: Not Currently  Other Topics Concern   Not on file  Social History Narrative   Not on file   Social Determinants of Health   Financial Resource Strain: Not on file  Food Insecurity: Not on file  Transportation Needs: Not on file  Physical Activity: Not on file  Stress: Not on file  Social Connections: Not on file  Intimate Partner Violence: Not on file    Outpatient Encounter Medications as of 03/06/2021  Medication Sig   amLODipine (NORVASC) 5 MG tablet Take 1.5 tablets (7.5 mg total) by mouth daily.   aspirin EC 81 MG tablet Take 81 mg by mouth 3 (three) times a week.   atorvastatin (LIPITOR) 40 MG tablet TAKE 1 TABLET BY MOUTH AT BEDTIME   clobetasol cream (TEMOVATE) 0.05 % APPLY TO THE AFFECTED AREA(S) TWICE DAILY UNTIL PSORIATIC LESION RESOLVED   lisinopril (ZESTRIL) 30 MG tablet Take 1 tablet (30 mg total) by mouth daily. NOTE CHANGE IN DOSE   polyethylene glycol (MIRALAX / GLYCOLAX) 17 g packet Take 17 g  by mouth daily.   primidone (MYSOLINE) 50 MG tablet TAKE 1 AND 1/2 TABLETS BY MOUTH THREE TIMES DAILY   propranolol (INDERAL) 20 MG tablet TAKE 1 TABLET BY MOUTH TWICE DAILY   warfarin (COUMADIN) 5 MG tablet Take 1.5 tablets (7.5 mg total) by mouth daily at 4 PM. On Monday, Tuesday, and Saturday take 1 tablet.   No facility-administered encounter medications on file as of 03/06/2021.    Allergies  Allergen Reactions   Codeine Other (See Comments)   Influenza Vaccines    Sulfa Antibiotics Hives    Review of Systems  Constitutional:  Negative for chills and fever.  Respiratory:  Negative for cough, chest tightness, shortness of breath and wheezing.   Cardiovascular:  Negative for chest pain, palpitations and leg swelling.  Gastrointestinal:  Negative for anal bleeding and blood in stool.  Genitourinary:  Negative for decreased urine volume and hematuria.  Neurological:  Negative for dizziness, tremors, seizures, syncope, facial asymmetry, speech difficulty, weakness, light-headedness, numbness and headaches.  Hematological:  Does not bruise/bleed easily.  Psychiatric/Behavioral:  Negative for confusion.   All other systems reviewed and are negative.      Objective:  BP (!) 150/77    Pulse (!) 56    Temp (!) 97.4 F (36.3 C) (Temporal)    Ht 5' (1.524 m)    Wt 62.6 kg    BMI 26.95 kg/m    Wt Readings from Last 3 Encounters:  03/06/21 62.6 kg  02/28/21 62.6 kg  01/24/21 60.3 kg    Physical Exam Vitals and nursing note reviewed.  Constitutional:      Comments: Frail elderly  HENT:     Head: Normocephalic and atraumatic.  Eyes:     Conjunctiva/sclera: Conjunctivae normal.     Pupils: Pupils are equal, round, and reactive to light.  Cardiovascular:     Rate and Rhythm: Normal rate. Rhythm irregularly irregular.     Heart sounds: Normal heart sounds. No murmur heard. Pulmonary:     Effort: Pulmonary effort is normal.     Breath sounds: Normal breath sounds.   Musculoskeletal:     Right lower leg: No edema.     Left lower leg: No edema.  Skin:    General: Skin is warm and dry.     Capillary Refill: Capillary refill takes less than 2 seconds.  Neurological:     General: No focal deficit present.     Mental Status: She is alert. Mental status is at baseline.     Gait: Gait abnormal (slow, uses walker).  Psychiatric:        Mood and Affect: Mood normal.        Thought Content: Thought content normal.        Judgment: Judgment normal.  Results for orders placed or performed in visit on 03/06/21  POCT INR  Result Value Ref Range   INR 1.4 (A) 2.0 - 3.0  POCT INR  Result Value Ref Range   INR 1.4 (A) 2.0 - 3.0       Pertinent labs & imaging results that were available during my care of the patient were reviewed by me and considered in my medical decision making.  Assessment & Plan:  Shebra was seen today for anticoagulation.  Diagnoses and all orders for this visit:  Chronic anticoagulation Longstanding persistent atrial fibrillation ([place]) Cerebrovascular accident (CVA) due to occlusion of left middle cerebral artery (HCC) -     CoaguChek XS/INR Waived - INR 1.4, not at goal will increase to 10 mg twice weekly and reevaluate in 10 days.     Continue all other maintenance medications.  Follow up plan: Return in about 10 days (around 03/16/2021), or if symptoms worsen or fail to improve, for INR.   Continue healthy lifestyle choices, including diet (rich in fruits, vegetables, and lean proteins, and low in salt and simple carbohydrates) and exercise (at least 30 minutes of moderate physical activity daily).    The above assessment and management plan was discussed with the patient. The patient verbalized understanding of and has agreed to the management plan. Patient is aware to call the clinic if they develop any new symptoms or if symptoms persist or worsen. Patient is aware when to return to the clinic for a follow-up visit.  Patient educated on when it is appropriate to go to the emergency department.   Addison Shantai Tiedeman, NP-S  I personally was present during the history, physical exam, and medical decision-making activities of this visit and have verified that the services and findings are accurately documented in the nurse practitioner student's note.  Monia Pouch, FNP-C [facility] [address] [place], [place] [address] [phone]

## 2021-03-18 ENCOUNTER — Encounter: Payer: Self-pay | Admitting: Family Medicine

## 2021-03-18 ENCOUNTER — Ambulatory Visit (INDEPENDENT_AMBULATORY_CARE_PROVIDER_SITE_OTHER): Payer: Medicare Other | Admitting: Family Medicine

## 2021-03-18 VITALS — BP 138/68 | HR 59 | Temp 98.0°F | Resp 20 | Ht 60.0 in | Wt 138.0 lb

## 2021-03-18 DIAGNOSIS — Z7901 Long term (current) use of anticoagulants: Secondary | ICD-10-CM | POA: Diagnosis not present

## 2021-03-18 DIAGNOSIS — I63512 Cerebral infarction due to unspecified occlusion or stenosis of left middle cerebral artery: Secondary | ICD-10-CM | POA: Diagnosis not present

## 2021-03-18 DIAGNOSIS — I6930 Unspecified sequelae of cerebral infarction: Secondary | ICD-10-CM | POA: Diagnosis not present

## 2021-03-18 DIAGNOSIS — I4811 Longstanding persistent atrial fibrillation: Secondary | ICD-10-CM | POA: Diagnosis not present

## 2021-03-18 LAB — POCT INR: INR: 2.2 (ref 2.0–3.0)

## 2021-03-18 LAB — COAGUCHEK XS/INR WAIVED
INR: 2.2 — ABNORMAL HIGH (ref 0.9–1.1)
Prothrombin Time: 26.9 s

## 2021-03-18 NOTE — Progress Notes (Signed)
Subjective:  Patient ID: Dominique Farley, female    DOB: June 14, 1926, 88 y.o.   MRN: [id number]  Patient Care Team: [name], DO as PCP - General (Family Medicine) Harl Bowie Alphonse Guild, MD as PCP - Cardiology (Cardiology) Warren Danes, PA-C as Physician Assistant (Dermatology) Lavonna Monarch, MD as Consulting Physician (Dermatology)   Chief Complaint:  Anticoagulation   HPI: Dominique Farley is a 88 y.o. female presenting on 03/18/2021 for Anticoagulation   Pt presents today for INR recheck. Regimen was adjusted at last visit due to subtherapeutic INR. Pt has been taking 5 mg 4 days per week and 10 mg three days per week of her warfarin. She has been taking 81 mg ASA three days per week. She denies worsening bruising. No abnormal bleeding. No chest pain, shortness of breath, palpitations, headaches, dizziness, focal neurological deficits, or confusion.    Relevant past medical, surgical, family, and social history reviewed and updated as indicated.  Allergies and medications reviewed and updated. Data reviewed: Chart in Epic.   Past Medical History:  Diagnosis Date   Atrial fibrillation ([place])    BCC (basal cell carcinoma of skin) 11/14/2004   Right Mid Cheek(Cx3,5FU)   BCC (basal cell carcinoma of skin) 04/16/2011   Right Crease(tx p bx)   BCC (basal cell carcinoma of skin) 06/16/2016   Left Nare(tx p bx)   BCC (basal cell carcinoma of skin) Atypical Basaloid 09/29/2018   Left Side Nose(tx p bx)   Hypertension    Osteoporosis    SCC (squamous cell carcinoma) 09/29/2018   Left Jawline(tx p bx)   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   Right Jawline(Cx3,5FU) and Left Upper Cheek(Cx3,5FU)   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   Left Jawline(tx p bx), Nose(tx p bx), Right Cheek(tx p bx), and Right Forearm(tx p bx)   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   Right Temple(Cx3,5FU) and Left Outer Eye(Cx3,5FU)   Squamous cell carcinoma in situ  (SCCIS) 11/14/2004   Right Forearm(Cx3,5FU)   Squamous cell carcinoma in situ (SCCIS) 03/13/2005   Left Side of Nose(Cx3,5FU)   Squamous cell carcinoma in situ (SCCIS) 06/04/2005   Inner Right Cheek/Eye(Tx p Bx)   Squamous cell carcinoma in situ (SCCIS) 02/12/2010   Right Cheek(tx p bx)   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 2 09/29/2018   Upper Lip(tx p bx) and Bridge of Nose(tx p bx)   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   Left Forehead, Left Neck, Right Nose, and Right Hand   Stroke [facility])    Tremor     Past Surgical History:  Procedure Laterality Date   CHOLECYSTECTOMY     skin biopsies      Social History   Socioeconomic History   Marital status: Widowed    Spouse name: Not on file   Number of children: 3   Years of education: Not on file   Highest education level: Not on file  Occupational History   Occupation: Retired  Tobacco Use   Smoking status: Never   Smokeless tobacco: Never  Vaping Use   Vaping Use: Never used  Substance and Sexual Activity   Alcohol use: Never   Drug use: Never   Sexual activity: Not Currently  Other Topics Concern   Not on file  Social History Narrative   Not on file   Social Determinants of Health   Financial Resource Strain: Not on file  Food  Insecurity: Not on file  Transportation Needs: Not on file  Physical Activity: Not on file  Stress: Not on file  Social Connections: Not on file  Intimate Partner Violence: Not on file    Outpatient Encounter Medications as of 03/18/2021  Medication Sig   amLODipine (NORVASC) 5 MG tablet Take 1.5 tablets (7.5 mg total) by mouth daily.   aspirin EC 81 MG tablet Take 81 mg by mouth 3 (three) times a week.   atorvastatin (LIPITOR) 40 MG tablet TAKE 1 TABLET BY MOUTH AT BEDTIME   clobetasol cream (TEMOVATE) 0.05 % APPLY TO THE AFFECTED AREA(S) TWICE DAILY UNTIL PSORIATIC LESION RESOLVED   lisinopril (ZESTRIL) 30 MG tablet Take 1 tablet (30 mg total) by mouth daily.  NOTE CHANGE IN DOSE   polyethylene glycol (MIRALAX / GLYCOLAX) 17 g packet Take 17 g by mouth daily.   primidone (MYSOLINE) 50 MG tablet TAKE 1 AND 1/2 TABLETS BY MOUTH THREE TIMES DAILY   propranolol (INDERAL) 20 MG tablet TAKE 1 TABLET BY MOUTH TWICE DAILY   warfarin (COUMADIN) 5 MG tablet Take 1.5 tablets (7.5 mg total) by mouth daily at 4 PM. On Monday, Tuesday, and Saturday take 1 tablet.   No facility-administered encounter medications on file as of 03/18/2021.    Allergies  Allergen Reactions   Codeine Other (See Comments)   Influenza Vaccines    Sulfa Antibiotics Hives    Review of Systems  Constitutional:  Negative for activity change, appetite change, chills, diaphoresis, fatigue, fever and unexpected weight change.  Respiratory:  Negative for cough and shortness of breath.   Cardiovascular:  Negative for chest pain, palpitations and leg swelling.  Gastrointestinal:  Negative for abdominal pain, anal bleeding and blood in stool.  Genitourinary:  Negative for hematuria and vaginal bleeding.  Neurological:  Negative for dizziness, tremors, seizures, syncope, facial asymmetry, speech difficulty, weakness, light-headedness, numbness and headaches.  Hematological:  Negative for adenopathy. Does not bruise/bleed easily.  Psychiatric/Behavioral:  Negative for confusion.   All other systems reviewed and are negative.      Objective:  BP 138/68    Pulse (!) 59    Temp 98 F (36.7 C)    Resp 20    Ht 5' (1.524 m)    Wt 138 lb (62.6 kg)    SpO2 94%    BMI 26.95 kg/m    Wt Readings from Last 3 Encounters:  03/18/21 138 lb (62.6 kg)  03/06/21 138 lb (62.6 kg)  02/28/21 138 lb (62.6 kg)    Physical Exam Vitals and nursing note reviewed.  Constitutional:      General: She is not in acute distress.    Appearance: She is not ill-appearing, toxic-appearing or diaphoretic.     Comments: Frail elderly  HENT:     Head: Normocephalic and atraumatic.  Eyes:     Conjunctiva/sclera:  Conjunctivae normal.     Pupils: Pupils are equal, round, and reactive to light.  Cardiovascular:     Rate and Rhythm: Normal rate. Rhythm irregularly irregular.     Heart sounds: Normal heart sounds.  Pulmonary:     Effort: Pulmonary effort is normal.     Breath sounds: Normal breath sounds.  Musculoskeletal:     Right lower leg: No edema.     Left lower leg: No edema.  Skin:    General: Skin is warm and dry.     Capillary Refill: Capillary refill takes less than 2 seconds.     Findings: Bruising (scattered  on bilateral upper arms) present.  Neurological:     General: No focal deficit present.     Mental Status: She is alert and oriented to person, place, and time. Mental status is at baseline.     Gait: Gait abnormal (slow, using cane).  Psychiatric:        Mood and Affect: Mood normal.        Behavior: Behavior normal.        Thought Content: Thought content normal.        Judgment: Judgment normal.    Results for orders placed or performed in visit on 03/18/21  CoaguChek XS/INR Waived  Result Value Ref Range   INR 2.2 (H) 0.9 - 1.1   Prothrombin Time 26.9 sec  POCT INR  Result Value Ref Range   INR 2.2 2.0 - 3.0       Pertinent labs & imaging results that were available during my care of the patient were reviewed by me and considered in my medical decision making.  Assessment & Plan:  Dera was seen today for anticoagulation.  Diagnoses and all orders for this visit:  Chronic anticoagulation Cerebrovascular accident (CVA) due to occlusion of left middle cerebral artery (HCC) Longstanding persistent atrial fibrillation (HCC) INR 2.2 today. No changes in regimen. Follow up in 4-6 weeks for recheck and management of chronic medical conditions.  -     CoaguChek XS/INR Waived -     POCT INR     Continue all other maintenance medications.  Follow up plan: Return in about 4 weeks (around 04/15/2021), or if symptoms worsen or fail to improve, for INR.   Continue  healthy lifestyle choices, including diet (rich in fruits, vegetables, and lean proteins, and low in salt and simple carbohydrates) and exercise (at least 30 minutes of moderate physical activity daily).  Educational handout given for anticoagulation calendar  The above assessment and management plan was discussed with the patient. The patient verbalized understanding of and has agreed to the management plan. Patient is aware to call the clinic if they develop any new symptoms or if symptoms persist or worsen. Patient is aware when to return to the clinic for a follow-up visit. Patient educated on when it is appropriate to go to the emergency department.   Monia Pouch, FNP-C [facility] [phone]

## 2021-03-27 ENCOUNTER — Telehealth: Payer: Self-pay | Admitting: Family Medicine

## 2021-03-27 DIAGNOSIS — L110 Acquired keratosis follicularis: Secondary | ICD-10-CM | POA: Diagnosis not present

## 2021-03-27 DIAGNOSIS — M79671 Pain in right foot: Secondary | ICD-10-CM | POA: Diagnosis not present

## 2021-03-27 DIAGNOSIS — M79672 Pain in left foot: Secondary | ICD-10-CM | POA: Diagnosis not present

## 2021-03-27 DIAGNOSIS — I739 Peripheral vascular disease, unspecified: Secondary | ICD-10-CM | POA: Diagnosis not present

## 2021-03-27 NOTE — Telephone Encounter (Signed)
Daughter wants to let pcp know that pt has bruises on her legs. Caregiver noticed it today. Daughter wants to know if he could be a reaction from warfarin (COUMADIN) 5 MG tablet rx. Please call back ?

## 2021-03-28 NOTE — Telephone Encounter (Signed)
Absolutely.  It is a blood thinner and she can develop bruising and bleeding MUCH more easily on this medication. ?

## 2021-03-28 NOTE — Telephone Encounter (Signed)
Patient daughter aware. °

## 2021-04-10 ENCOUNTER — Encounter: Payer: Self-pay | Admitting: Family Medicine

## 2021-04-10 ENCOUNTER — Ambulatory Visit (INDEPENDENT_AMBULATORY_CARE_PROVIDER_SITE_OTHER): Payer: Medicare Other | Admitting: Family Medicine

## 2021-04-10 VITALS — BP 145/85 | HR 60 | Temp 97.2°F | Ht 60.0 in | Wt 138.2 lb

## 2021-04-10 DIAGNOSIS — Z7901 Long term (current) use of anticoagulants: Secondary | ICD-10-CM | POA: Diagnosis not present

## 2021-04-10 DIAGNOSIS — I4811 Longstanding persistent atrial fibrillation: Secondary | ICD-10-CM

## 2021-04-10 DIAGNOSIS — S41112A Laceration without foreign body of left upper arm, initial encounter: Secondary | ICD-10-CM | POA: Diagnosis not present

## 2021-04-10 DIAGNOSIS — I63512 Cerebral infarction due to unspecified occlusion or stenosis of left middle cerebral artery: Secondary | ICD-10-CM

## 2021-04-10 DIAGNOSIS — I6930 Unspecified sequelae of cerebral infarction: Secondary | ICD-10-CM | POA: Diagnosis not present

## 2021-04-10 LAB — POCT INR: INR: 1.9 — AB (ref 2–3)

## 2021-04-10 LAB — COAGUCHEK XS/INR WAIVED
INR: 1.9 — ABNORMAL HIGH (ref 0.9–1.1)
Prothrombin Time: 23 s

## 2021-04-10 LAB — HEMOGLOBIN, FINGERSTICK: Hemoglobin: 14 g/dL (ref 11.1–15.9)

## 2021-04-10 NOTE — Progress Notes (Signed)
? ?Subjective: ?CC: INR check ?PCP: [name], DO ?ZSW:FUXN Dominique Farley is a 88 y.o. female presenting to clinic today for: ? ?1.  INR check ?Patient is chronically anticoagulated for atrial fibrillation.  Goal INR 2-3 ?She was last seen about a month ago and INR was therapeutic at 2.2.  She presents today for checkup.  No reports of abnormal bleeding.  She has prolonged bleeding of course if she gets cut or has a skin tear, which does occur often.  She is taking 2 tablets daily except for 1 tablet on Mondays, Tuesdays and Saturdays.  She has had no changes in diet and has not been on any medications outside of her prescribed medicines. ? ? ?ROS: Per HPI ? ?Allergies  ?Allergen Reactions  ? Codeine Other (See Comments)  ? Influenza Vaccines   ? Sulfa Antibiotics Hives  ? ?Past Medical History:  ?Diagnosis Date  ? Atrial fibrillation ([place])   ? BCC (basal cell carcinoma of skin) 11/14/2004  ? Right Mid Cheek(Cx3,5FU)  ? BCC (basal cell carcinoma of skin) 04/16/2011  ? Right Crease(tx p bx)  ? BCC (basal cell carcinoma of skin) 06/16/2016  ? Left Nare(tx p bx)  ? BCC (basal cell carcinoma of skin) Atypical Basaloid 09/29/2018  ? Left Side Nose(tx p bx)  ? Hypertension   ? Osteoporosis   ? SCC (squamous cell carcinoma) 09/29/2018  ? Left Jawline(tx p bx)  ? 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  ? Right Jawline(Cx3,5FU) and Left Upper Cheek(Cx3,5FU)  ? 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  ? Left Jawline(tx p bx), Nose(tx p bx), Right Cheek(tx p bx), and Right Forearm(tx p bx)  ? 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  ? Right Temple(Cx3,5FU) and Left Outer Eye(Cx3,5FU)  ? 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  ? Right Forearm(Cx3,5FU)  ? Squamous cell carcinoma in situ (SCCIS) 03/13/2005  ? Left Side of Nose(Cx3,5FU)  ? Squamous cell carcinoma in situ (SCCIS) 06/04/2005  ? Inner Right Cheek/Eye(Tx p Bx)  ? Squamous cell carcinoma in situ (SCCIS) 02/12/2010  ? Right  Cheek(tx p bx)  ? 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 2 09/29/2018  ? Upper Lip(tx p bx) and Bridge of Nose(tx p bx)  ? 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 Left Forehead, Left Neck, Right Nose, and Right Hand  ? Stroke [facility])   ? Tremor   ? ? ?Current Outpatient Medications:  ?  amLODipine (NORVASC) 5 MG tablet, Take 1.5 tablets (7.5 mg total) by mouth daily., Disp: 135 tablet, Rfl: 1 ?  aspirin EC 81 MG tablet, Take 81 mg by mouth 3 (three) times a week., Disp: , Rfl:  ?  atorvastatin (LIPITOR) 40 MG tablet, TAKE 1 TABLET BY MOUTH AT BEDTIME, Disp: 30 tablet, Rfl: 5 ?  clobetasol cream (TEMOVATE) 0.05 %, APPLY TO THE AFFECTED AREA(S) TWICE DAILY UNTIL PSORIATIC LESION RESOLVED, Disp: 120 g, Rfl: 2 ?  lisinopril (ZESTRIL) 30 MG tablet, Take 1 tablet (30 mg total) by mouth daily. NOTE CHANGE IN DOSE, Disp: 90 tablet, Rfl: 1 ?  polyethylene glycol (MIRALAX / GLYCOLAX) 17 g packet, Take 17 g by mouth daily., Disp: , Rfl:  ?  primidone (MYSOLINE) 50 MG tablet, TAKE 1 AND 1/2 TABLETS BY MOUTH THREE TIMES DAILY, Disp: 405 tablet, Rfl: 0 ?  propranolol (INDERAL) 20 MG tablet, TAKE 1 TABLET BY MOUTH TWICE DAILY, Disp: 60 tablet, Rfl: 2 ?  warfarin (COUMADIN)  5 MG tablet, Take 1.5 tablets (7.5 mg total) by mouth daily at 4 PM. On Monday, Tuesday, and Saturday take 1 tablet., Disp: 135 tablet, Rfl: 3 ?Social History  ? ?Socioeconomic History  ? Marital status: Widowed  ?  Spouse name: Not on file  ? Number of children: 3  ? Years of education: Not on file  ? Highest education level: Not on file  ?Occupational History  ? Occupation: Retired  ?Tobacco Use  ? Smoking status: Never  ? Smokeless tobacco: Never  ?Vaping Use  ? Vaping Use: Never used  ?Substance and Sexual Activity  ? Alcohol use: Never  ? Drug use: Never  ? Sexual activity: Not Currently  ?Other Topics Concern  ? Not on file  ?Social History Narrative  ? Not on file  ? ?Social Determinants of Health  ? ?Financial Resource Strain: Not on  file  ?Food Insecurity: Not on file  ?Transportation Needs: Not on file  ?Physical Activity: Not on file  ?Stress: Not on file  ?Social Connections: Not on file  ?Intimate Partner Violence: Not on file  ? ?Family History  ?Problem Relation Age of Onset  ? Dementia Mother   ? Stroke Father   ? Heart disease Father   ? Diabetes Sister   ? Stroke Son   ? ? ?Objective: ?Office vital signs reviewed. ?BP (!) 145/85   Pulse 60   Temp (!) 97.2 ?F (36.2 ?C)   Ht 5' (1.524 m)   Wt 138 lb 3.2 oz (62.7 kg)   SpO2 97%   BMI 26.99 kg/m?  ? ?Physical Examination:  ?General: Awake, alert, well nourished, No acute distress ?HEENT: Sclera white.  Moist mucous membranes ?Cardio: Irregularly irregular heartbeat with rate control.  S1S2 heard, murmur appreciated ?Pulm: clear to auscultation bilaterally, no wheezes, rhonchi or rales; normal work of breathing on room air ?MSK: Requires assistance for ambulation ?Skin: Linear, horizontal skin tear that is hemostatic appreciated along the left upper arm ? ?Assessment/ Plan: ?88 y.o. female  ? ?Longstanding persistent atrial fibrillation ([place]) - Plan: CoaguChek XS/INR Waived, Hemoglobin, fingerstick, POCT INR ? ?Chronic anticoagulation - Plan: CoaguChek XS/INR Waived, Hemoglobin, fingerstick, POCT INR ? ?Cerebrovascular accident (CVA) due to occlusion of left middle cerebral artery ([place]) - Plan: POCT INR ? ?Skin tear of left upper arm without complication, initial encounter ? ?INR is subtherapeutic at 1.9.  Increase to 2 tablets 5 days/week and 1 tablet 1 day/week.  2-week follow-up has been scheduled ? ?Skin tear was repaired with Steri-Strips here in office.  Supplies given to her daughter for future needs.  Advised to remove in 3 to 5 days if they have not already fallen off ? ?No orders of the defined types were placed in this encounter. ? ?No orders of the defined types were placed in this encounter. ? ? ? ?[name], DO ?[place] ?[phone] ? ?

## 2021-04-29 ENCOUNTER — Ambulatory Visit (INDEPENDENT_AMBULATORY_CARE_PROVIDER_SITE_OTHER): Payer: Medicare Other | Admitting: Family Medicine

## 2021-04-29 ENCOUNTER — Encounter: Payer: Self-pay | Admitting: Physician Assistant

## 2021-04-29 ENCOUNTER — Ambulatory Visit: Payer: Medicare Other | Admitting: Physician Assistant

## 2021-04-29 ENCOUNTER — Encounter: Payer: Self-pay | Admitting: Family Medicine

## 2021-04-29 ENCOUNTER — Other Ambulatory Visit: Payer: Self-pay | Admitting: Family Medicine

## 2021-04-29 VITALS — BP 135/78 | HR 71 | Temp 98.9°F | Ht 60.0 in | Wt 135.0 lb

## 2021-04-29 DIAGNOSIS — Z1283 Encounter for screening for malignant neoplasm of skin: Secondary | ICD-10-CM

## 2021-04-29 DIAGNOSIS — C44622 Squamous cell carcinoma of skin of right upper limb, including shoulder: Secondary | ICD-10-CM | POA: Diagnosis not present

## 2021-04-29 DIAGNOSIS — I63512 Cerebral infarction due to unspecified occlusion or stenosis of left middle cerebral artery: Secondary | ICD-10-CM

## 2021-04-29 DIAGNOSIS — I6930 Unspecified sequelae of cerebral infarction: Secondary | ICD-10-CM | POA: Diagnosis not present

## 2021-04-29 DIAGNOSIS — D0462 Carcinoma in situ of skin of left upper limb, including shoulder: Secondary | ICD-10-CM

## 2021-04-29 DIAGNOSIS — D0461 Carcinoma in situ of skin of right upper limb, including shoulder: Secondary | ICD-10-CM

## 2021-04-29 DIAGNOSIS — Z7901 Long term (current) use of anticoagulants: Secondary | ICD-10-CM | POA: Diagnosis not present

## 2021-04-29 DIAGNOSIS — Z85828 Personal history of other malignant neoplasm of skin: Secondary | ICD-10-CM | POA: Diagnosis not present

## 2021-04-29 DIAGNOSIS — Z808 Family history of malignant neoplasm of other organs or systems: Secondary | ICD-10-CM | POA: Diagnosis not present

## 2021-04-29 DIAGNOSIS — G250 Essential tremor: Secondary | ICD-10-CM

## 2021-04-29 DIAGNOSIS — D485 Neoplasm of uncertain behavior of skin: Secondary | ICD-10-CM

## 2021-04-29 DIAGNOSIS — I4811 Longstanding persistent atrial fibrillation: Secondary | ICD-10-CM

## 2021-04-29 DIAGNOSIS — R791 Abnormal coagulation profile: Secondary | ICD-10-CM | POA: Diagnosis not present

## 2021-04-29 LAB — COAGUCHEK XS/INR WAIVED
INR: 4.2 — ABNORMAL HIGH (ref 0.9–1.1)
Prothrombin Time: 50.5 s

## 2021-04-29 LAB — POCT INR: INR: 4.2 — AB (ref 2–3)

## 2021-04-29 NOTE — Progress Notes (Deleted)
? ?Follow-Up Visit ?  ?Subjective  ?Dominique Farley is a 88 y.o. female who presents for the following: Annual Exam (Patient here today with her daughter Dominique Farley) for skin check. Per patient no concerns, patient states that the lesions on her face do itch. Per patient's daughter she believes patient is scratching her face at night. Personal history and family history of non mole skin cancer. ).Her nurse assistant that takes care of her at home has been applying fluorouracil to her face off and on. Her face has scabs and redness from this.  ? ? ?The following portions of the chart were reviewed this encounter and updated as appropriate:  Tobacco  Allergies  Meds  Problems  Med Hx  Surg Hx  Fam Hx   ?  ? ?Objective  ?Well appearing patient in no apparent distress; mood and affect are within normal limits. ? ?A full examination was performed including scalp, head, eyes, ears, nose, lips, neck, chest, axillae, abdomen, back, buttocks, bilateral upper extremities, bilateral lower extremities, hands, feet, fingers, toes, fingernails, and toenails. All findings within normal limits unless otherwise noted below. ? ?Right Forearm - Posterior ?Hyperkeratotic scale with pink base  ? ? ? ? ? ? ?Right Dorsal Hand ?Crusted heme on a nodular base. ? ? ? ? ? ? ?Left Forearm - Anterior ?Thick crust.  ? ? ? ? ? ? ? ?Assessment & Plan  ?Neoplasm of uncertain behavior of skin (3) ?Right Forearm - Posterior ? ?Skin / nail biopsy ?Type of biopsy: tangential   ?Informed consent: discussed and consent obtained   ?Timeout: patient name, date of birth, surgical site, and procedure verified   ?Procedure prep:  Patient was prepped and draped in usual sterile fashion (Non sterile) ?Prep type:  Chlorhexidine ?Anesthesia: the lesion was anesthetized in a standard fashion   ?Anesthetic:  1% lidocaine w/ epinephrine 1-100,000 local infiltration ?Instrument used: flexible razor blade   ?Outcome: patient tolerated procedure well   ?Post-procedure  details: wound care instructions given   ? ?Destruction of lesion ?Complexity: simple   ?Destruction method: electrodesiccation and curettage   ?Informed consent: discussed and consent obtained   ?Timeout:  patient name, date of birth, surgical site, and procedure verified ?Anesthesia: the lesion was anesthetized in a standard fashion   ?Anesthetic:  1% lidocaine w/ epinephrine 1-100,000 local infiltration ?Curettage performed in three different directions: Yes   ?Electrodesiccation performed over the curetted area: Yes   ?Curettage cycles:  3 ?Margin per side (cm):  0.1 ?Final wound size (cm):  1.8 ?Hemostasis achieved with:  aluminum chloride ?Outcome: patient tolerated procedure well with no complications   ?Post-procedure details: wound care instructions given   ? ?Specimen 1 - Surgical pathology ?Differential Diagnosis: bcc vs scc- txpbx ? ?Check Margins: No ? ?Right Dorsal Hand ? ?Skin / nail biopsy ?Type of biopsy: tangential   ?Informed consent: discussed and consent obtained   ?Timeout: patient name, date of birth, surgical site, and procedure verified   ?Procedure prep:  Patient was prepped and draped in usual sterile fashion (Non sterile) ?Prep type:  Chlorhexidine ?Anesthesia: the lesion was anesthetized in a standard fashion   ?Anesthetic:  1% lidocaine w/ epinephrine 1-100,000 local infiltration ?Instrument used: flexible razor blade   ?Outcome: patient tolerated procedure well   ?Post-procedure details: wound care instructions given   ? ?Destruction of lesion ?Complexity: simple   ?Destruction method: electrodesiccation and curettage   ?Informed consent: discussed and consent obtained   ?Timeout:  patient name, date of birth,  surgical site, and procedure verified ?Anesthesia: the lesion was anesthetized in a standard fashion   ?Anesthetic:  1% lidocaine w/ epinephrine 1-100,000 local infiltration ?Curettage performed in three different directions: Yes   ?Electrodesiccation performed over the curetted  area: Yes   ?Curettage cycles:  3 ?Margin per side (cm):  0.1 ?Final wound size (cm):  1.7 ?Hemostasis achieved with:  aluminum chloride ?Outcome: patient tolerated procedure well with no complications   ?Post-procedure details: wound care instructions given   ? ?Specimen 2 - Surgical pathology ?Differential Diagnosis: bcc vs scc- txpbx ? ?Check Margins: No ? ?Left Forearm - Anterior ? ?Skin / nail biopsy ?Type of biopsy: tangential   ?Informed consent: discussed and consent obtained   ?Timeout: patient name, date of birth, surgical site, and procedure verified   ?Procedure prep:  Patient was prepped and draped in usual sterile fashion (Non sterile) ?Prep type:  Chlorhexidine ?Anesthesia: the lesion was anesthetized in a standard fashion   ?Anesthetic:  1% lidocaine w/ epinephrine 1-100,000 local infiltration ?Instrument used: flexible razor blade   ?Outcome: patient tolerated procedure well   ?Post-procedure details: wound care instructions given   ? ?Destruction of lesion ?Complexity: simple   ?Destruction method: electrodesiccation and curettage   ?Informed consent: discussed and consent obtained   ?Timeout:  patient name, date of birth, surgical site, and procedure verified ?Anesthesia: the lesion was anesthetized in a standard fashion   ?Anesthetic:  1% lidocaine w/ epinephrine 1-100,000 local infiltration ?Curettage performed in three different directions: Yes   ?Electrodesiccation performed over the curetted area: Yes   ?Curettage cycles:  3 ?Margin per side (cm):  0.1 ?Final wound size (cm):  1.8 ?Hemostasis achieved with:  aluminum chloride ?Outcome: patient tolerated procedure well with no complications   ?Post-procedure details: wound care instructions given   ? ?Specimen 3 - Surgical pathology ?Differential Diagnosis: bcc vs scc- txpbx ? ?Check Margins: No ? ? ? ?I, Ezekiah Massie, PA-C, have reviewed all documentation's for this visit.  The documentation on 04/29/21 for the exam, diagnosis, procedures  and orders are all accurate and complete. ?

## 2021-04-29 NOTE — Progress Notes (Signed)
? ?Subjective: ?CC: INR check ?PCP: [name], DO ?RDE:YCXK Decoursey is a 88 y.o. female presenting to clinic today for: ? ?1.  Chronic anticoagulation for atrial fibrillation ?Patient is accompanied by her other daughter today, who manages her medication.  She has been placing 2 tablets in her box 4 days/week and 1 tablet 3 days/week.  She has had a little bit more wheezing after she had some biopsies done of her lesions on her arms today.  Otherwise she has been doing well. ? ? ?ROS: Per HPI ? ?Allergies  ?Allergen Reactions  ? Codeine Other (See Comments)  ? Influenza Vaccines   ? Sulfa Antibiotics Hives  ? ?Past Medical History:  ?Diagnosis Date  ? Atrial fibrillation ([place])   ? BCC (basal cell carcinoma of skin) 11/14/2004  ? Right Mid Cheek(Cx3,5FU)  ? BCC (basal cell carcinoma of skin) 04/16/2011  ? Right Crease(tx p bx)  ? BCC (basal cell carcinoma of skin) 06/16/2016  ? Left Nare(tx p bx)  ? BCC (basal cell carcinoma of skin) Atypical Basaloid 09/29/2018  ? Left Side Nose(tx p bx)  ? Hypertension   ? Osteoporosis   ? SCC (squamous cell carcinoma) 09/29/2018  ? Left Jawline(tx p bx)  ? 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  ? Right Jawline(Cx3,5FU) and Left Upper Cheek(Cx3,5FU)  ? 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  ? Left Jawline(tx p bx), Nose(tx p bx), Right Cheek(tx p bx), and Right Forearm(tx p bx)  ? 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  ? Right Temple(Cx3,5FU) and Left Outer Eye(Cx3,5FU)  ? 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  ? Right Forearm(Cx3,5FU)  ? Squamous cell carcinoma in situ (SCCIS) 03/13/2005  ? Left Side of Nose(Cx3,5FU)  ? Squamous cell carcinoma in situ (SCCIS) 06/04/2005  ? Inner Right Cheek/Eye(Tx p Bx)  ? Squamous cell carcinoma in situ (SCCIS) 02/12/2010  ? Right Cheek(tx p bx)  ? 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 2 09/29/2018  ? Upper Lip(tx p bx) and Bridge of Nose(tx p bx)  ? Squamous cell carcinoma in situ (SCCIS)  x 4 08/29/2007  ? Left Forehead, Left Neck, Right Nose, and Right Hand  ? Stroke [facility])   ? Tremor   ? ? ?Current Outpatient Medications:  ?  amLODipine (NORVASC) 5 MG tablet, Take 1.5 tablets (7.5 mg total) by mouth daily., Disp: 135 tablet, Rfl: 1 ?  aspirin EC 81 MG tablet, Take 81 mg by mouth 3 (three) times a week., Disp: , Rfl:  ?  atorvastatin (LIPITOR) 40 MG tablet, TAKE 1 TABLET BY MOUTH AT BEDTIME, Disp: 30 tablet, Rfl: 5 ?  clobetasol cream (TEMOVATE) 0.05 %, APPLY TO THE AFFECTED AREA(S) TWICE DAILY UNTIL PSORIATIC LESION RESOLVED, Disp: 120 g, Rfl: 2 ?  lisinopril (ZESTRIL) 30 MG tablet, Take 1 tablet (30 mg total) by mouth daily. NOTE CHANGE IN DOSE, Disp: 90 tablet, Rfl: 1 ?  polyethylene glycol (MIRALAX / GLYCOLAX) 17 g packet, Take 17 g by mouth daily., Disp: , Rfl:  ?  primidone (MYSOLINE) 50 MG tablet, TAKE 1 AND 1/2 TABLETS BY MOUTH THREE TIMES DAILY, Disp: 405 tablet, Rfl: 0 ?  propranolol (INDERAL) 20 MG tablet, TAKE 1 TABLET BY MOUTH TWICE DAILY, Disp: 60 tablet, Rfl: 2 ?  warfarin (COUMADIN) 5 MG tablet, Take 1.5 tablets (7.5 mg total) by mouth daily at 4 PM. On Monday, Tuesday, and Saturday take 1 tablet., Disp: 135 tablet, Rfl: 3 ?Social History  ? ?  Socioeconomic History  ? Marital status: Widowed  ?  Spouse name: Not on file  ? Number of children: 3  ? Years of education: Not on file  ? Highest education level: Not on file  ?Occupational History  ? Occupation: Retired  ?Tobacco Use  ? Smoking status: Never  ? Smokeless tobacco: Never  ?Vaping Use  ? Vaping Use: Never used  ?Substance and Sexual Activity  ? Alcohol use: Never  ? Drug use: Never  ? Sexual activity: Not Currently  ?Other Topics Concern  ? Not on file  ?Social History Narrative  ? Not on file  ? ?Social Determinants of Health  ? ?Financial Resource Strain: Not on file  ?Food Insecurity: Not on file  ?Transportation Needs: Not on file  ?Physical Activity: Not on file  ?Stress: Not on file  ?Social Connections: Not on file   ?Intimate Partner Violence: Not on file  ? ?Family History  ?Problem Relation Age of Onset  ? Dementia Mother   ? Stroke Father   ? Heart disease Father   ? Diabetes Sister   ? Stroke Son   ? ? ?Objective: ?Office vital signs reviewed. ?BP 135/78   Pulse 71   Temp 98.9 ?F (37.2 ?C)   Ht 5' (1.524 m)   Wt 135 lb (61.2 kg)   SpO2 93%   BMI 26.37 kg/m?  ? ?Physical Examination:  ?General: Awake, alert, nontoxic elderly female, No acute distress ?Cardio: Irregularly irregular with rate control ?Pulm: Normal work of breathing on room air ?Skin: Several lesions on the lower legs.  She has a couple of postsurgical sites on the arms with some scant oozing. ? ?Assessment/ Plan: ?88 y.o. female  ? ?Supratherapeutic INR - Plan: POCT INR ? ?Longstanding persistent atrial fibrillation ([place]) - Plan: POCT INR ? ?Chronic anticoagulation - Plan: CoaguChek XS/INR Waived, POCT INR ? ?Cerebrovascular accident (CVA) due to occlusion of left middle cerebral artery ([place]) - Plan: POCT INR ? ?INR supratherapeutic at 4.2.  Sounds like there is some discrepancy in what is being reported as her dosages at home for Coumadin and what is being recommended.  We are just can start from scratch.  I am having her hold her Coumadin for the next 2 days and then I want her to resume 1.5 tabs daily except for 1 tablet on Mondays, Tuesdays and Saturdays.  This regimen was reiterated to her daughter today and we will reconvene again in 13 days for recheck.  Appointment has been scheduled. ? ?Orders Placed This Encounter  ?Procedures  ? CoaguChek XS/INR Waived  ? ?No orders of the defined types were placed in this encounter. ? ? ? ?[name], DO ?[place] ?[phone] ? ?

## 2021-04-29 NOTE — Patient Instructions (Signed)

## 2021-05-05 ENCOUNTER — Telehealth: Payer: Self-pay | Admitting: *Deleted

## 2021-05-05 NOTE — Progress Notes (Signed)
? ?Follow-Up Visit ?  ?Subjective  ?Dominique Farley is a 88 y.o. female who presents for the following: Annual Exam (Patient here today with her daughter [name]) for skin check. Per patient no concerns, patient states that the lesions on her face do itch. Per patient's daughter she believes patient is scratching her face at night. Personal history and family history of non mole skin cancer. ). ? ? ?The following portions of the chart were reviewed this encounter and updated as appropriate:  Tobacco  Allergies  Meds  Problems  Med Hx  Surg Hx  Fam Hx   ?  ? ?Objective  ?Well appearing patient in no apparent distress; mood and affect are within normal limits. ? ?All skin waist up examined. ? ?Right Forearm - Posterior ?Hyperkeratotic scale with pink base  ? ? ? ? ? ? ?Right Dorsal Hand ?Crusted heme on a nodular base. ? ? ? ? ? ? ?Left Forearm - Anterior ?Thick crust.  ? ? ? ? ? ? ? ?Assessment & Plan  ?SCC (squamous cell carcinoma), arm, right ?Right Forearm - Posterior ? ?Skin / nail biopsy ?Type of biopsy: tangential   ?Informed consent: discussed and consent obtained   ?Timeout: patient name, date of birth, surgical site, and procedure verified   ?Procedure prep:  Patient was prepped and draped in usual sterile fashion (Non sterile) ?Prep type:  Chlorhexidine ?Anesthesia: the lesion was anesthetized in a standard fashion   ?Anesthetic:  1% lidocaine w/ epinephrine 1-100,000 local infiltration ?Instrument used: flexible razor blade   ?Outcome: patient tolerated procedure well   ?Post-procedure details: wound care instructions given   ? ?Destruction of lesion ?Complexity: simple   ?Destruction method: electrodesiccation and curettage   ?Informed consent: discussed and consent obtained   ?Timeout:  patient name, date of birth, surgical site, and procedure verified ?Anesthesia: the lesion was anesthetized in a standard fashion   ?Anesthetic:  1% lidocaine w/ epinephrine 1-100,000 local infiltration ?Curettage  performed in three different directions: Yes   ?Electrodesiccation performed over the curetted area: Yes   ?Curettage cycles:  3 ?Margin per side (cm):  0.1 ?Final wound size (cm):  1.8 ?Hemostasis achieved with:  aluminum chloride ?Outcome: patient tolerated procedure well with no complications   ?Post-procedure details: wound care instructions given   ? ?Specimen 1 - Surgical pathology ?Differential Diagnosis: bcc vs scc- txpbx ? ?Check Margins: No ? 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 ?Skin / nail biopsy ?Type of biopsy: tangential   ?Informed consent: discussed and consent obtained   ?Timeout: patient name, date of birth, surgical site, and procedure verified   ?Procedure prep:  Patient was prepped and draped in usual sterile fashion (Non sterile) ?Prep type:  Chlorhexidine ?Anesthesia: the lesion was anesthetized in a standard fashion   ?Anesthetic:  1% lidocaine w/ epinephrine 1-100,000 local infiltration ?Instrument used: flexible razor blade   ?Outcome: patient tolerated procedure well   ?Post-procedure details: wound care instructions given   ? ?Destruction of lesion ?Complexity: simple   ?Destruction method: electrodesiccation and curettage   ?Informed consent: discussed and consent obtained   ?Timeout:  patient name, date of birth, surgical site, and procedure verified ?Anesthesia: the lesion was anesthetized in a standard fashion   ?Anesthetic:  1% lidocaine w/ epinephrine 1-100,000 local infiltration ?Curettage performed in three different directions: Yes   ?Electrodesiccation performed over the curetted area: Yes   ?Curettage cycles:  3 ?Margin per side (cm):  0.1 ?  Final wound size (cm):  1.7 ?Hemostasis achieved with:  aluminum chloride ?Outcome: patient tolerated procedure well with no complications   ?Post-procedure details: wound care instructions given   ? ?Specimen 2 - Surgical pathology ?Differential Diagnosis: bcc vs scc- txpbx ? ?Check  Margins: No ? ?Carcinoma in situ of skin of left upper extremity including shoulder ?Left Forearm - Anterior ? ?Skin / nail biopsy ?Type of biopsy: tangential   ?Informed consent: discussed and consent obtained   ?Timeout: patient name, date of birth, surgical site, and procedure verified   ?Procedure prep:  Patient was prepped and draped in usual sterile fashion (Non sterile) ?Prep type:  Chlorhexidine ?Anesthesia: the lesion was anesthetized in a standard fashion   ?Anesthetic:  1% lidocaine w/ epinephrine 1-100,000 local infiltration ?Instrument used: flexible razor blade   ?Outcome: patient tolerated procedure well   ?Post-procedure details: wound care instructions given   ? ?Destruction of lesion ?Complexity: simple   ?Destruction method: electrodesiccation and curettage   ?Informed consent: discussed and consent obtained   ?Timeout:  patient name, date of birth, surgical site, and procedure verified ?Anesthesia: the lesion was anesthetized in a standard fashion   ?Anesthetic:  1% lidocaine w/ epinephrine 1-100,000 local infiltration ?Curettage performed in three different directions: Yes   ?Electrodesiccation performed over the curetted area: Yes   ?Curettage cycles:  3 ?Margin per side (cm):  0.1 ?Final wound size (cm):  1.8 ?Hemostasis achieved with:  aluminum chloride ?Outcome: patient tolerated procedure well with no complications   ?Post-procedure details: wound care instructions given   ? ?Specimen 3 - Surgical pathology ?Differential Diagnosis: bcc vs scc- txpbx ? ?Check Margins: No ? ? ? ?I, Briyanna Billingham, PA-C, have reviewed all documentation's for this visit.  The documentation on 05/05/21 for the exam, diagnosis, procedures and orders are all accurate and complete. ?

## 2021-05-05 NOTE — Telephone Encounter (Signed)
Path to patients daughter. Patient doing well no concerns.  ?

## 2021-05-05 NOTE — Telephone Encounter (Signed)
-----   Message from Warren Danes, [place] sent at 05/05/2021 12:32 PM EDT ----- ?Check status.  All were treated with biopsy.  ?

## 2021-05-12 ENCOUNTER — Ambulatory Visit (INDEPENDENT_AMBULATORY_CARE_PROVIDER_SITE_OTHER): Payer: Medicare Other | Admitting: Family Medicine

## 2021-05-12 ENCOUNTER — Encounter: Payer: Self-pay | Admitting: Family Medicine

## 2021-05-12 VITALS — BP 137/78 | HR 68 | Temp 98.0°F | Ht 60.0 in | Wt 137.2 lb

## 2021-05-12 DIAGNOSIS — T148XXA Other injury of unspecified body region, initial encounter: Secondary | ICD-10-CM

## 2021-05-12 DIAGNOSIS — Z8673 Personal history of transient ischemic attack (TIA), and cerebral infarction without residual deficits: Secondary | ICD-10-CM | POA: Diagnosis not present

## 2021-05-12 DIAGNOSIS — Z5181 Encounter for therapeutic drug level monitoring: Secondary | ICD-10-CM

## 2021-05-12 DIAGNOSIS — Z7901 Long term (current) use of anticoagulants: Secondary | ICD-10-CM | POA: Diagnosis not present

## 2021-05-12 DIAGNOSIS — I4811 Longstanding persistent atrial fibrillation: Secondary | ICD-10-CM

## 2021-05-12 LAB — COAGUCHEK XS/INR WAIVED
INR: 2.3 — ABNORMAL HIGH (ref 0.9–1.1)
Prothrombin Time: 27.2 s

## 2021-05-12 LAB — POCT INR: INR: 2.3 (ref 2–3)

## 2021-05-12 NOTE — Progress Notes (Signed)
? ?Subjective: ?CC:INR ?PCP: [name], DO ?WUJ:WJXB Dominique Farley is a 88 y.o. female presenting to clinic today for: ? ?1. INR recheck for Afib ?INR was supratherapeutic at over 4 just 2 weeks ago.  There apparently was some confusion regarding her dosing schedule.  She is here for interval checkup.  She advised to take 1.5 tablet daily except for 1 tablet on Mondays, Tuesdays and Saturdays.  No reports of bleeding.  She does have several postbiopsy lesions on her arms and face.  They are wondering if there is anything else they can do for wound care besides Vaseline. ? ? ?ROS: Per HPI ? ?Allergies  ?Allergen Reactions  ? Codeine Other (See Comments)  ? Influenza Vaccines   ? Sulfa Antibiotics Hives  ? ?Past Medical History:  ?Diagnosis Date  ? Acute cystitis with hematuria 11/22/2020  ? Atrial fibrillation ([place])   ? BCC (basal cell carcinoma of skin) 11/14/2004  ? Right Mid Cheek(Cx3,5FU)  ? BCC (basal cell carcinoma of skin) 04/16/2011  ? Right Crease(tx p bx)  ? BCC (basal cell carcinoma of skin) 06/16/2016  ? Left Nare(tx p bx)  ? BCC (basal cell carcinoma of skin) Atypical Basaloid 09/29/2018  ? Left Side Nose(tx p bx)  ? Hypertension   ? Osteoporosis   ? Pneumonia of lower lobe due to infectious organism 11/20/2020  ? SCC (squamous cell carcinoma) 09/29/2018  ? Left Jawline(tx p bx)  ? 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  ? Right Jawline(Cx3,5FU) and Left Upper Cheek(Cx3,5FU)  ? 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  ? Left Jawline(tx p bx), Nose(tx p bx), Right Cheek(tx p bx), and Right Forearm(tx p bx)  ? 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  ? Right Temple(Cx3,5FU) and Left Outer Eye(Cx3,5FU)  ? 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  ? Right Forearm(Cx3,5FU)  ? Squamous cell carcinoma in situ (SCCIS) 03/13/2005  ? Left Side of Nose(Cx3,5FU)  ? Squamous cell carcinoma in situ (SCCIS) 06/04/2005  ? Inner Right Cheek/Eye(Tx p Bx)  ? Squamous cell carcinoma in  situ (SCCIS) 02/12/2010  ? Right Cheek(tx p bx)  ? 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 2 09/29/2018  ? Upper Lip(tx p bx) and Bridge of Nose(tx p bx)  ? 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 Left Forehead, Left Neck, Right Nose, and Right Hand  ? Stroke [facility])   ? Tremor   ? Weakness 11/20/2020  ? ? ?Current Outpatient Medications:  ?  amLODipine (NORVASC) 5 MG tablet, Take 1.5 tablets (7.5 mg total) by mouth daily., Disp: 135 tablet, Rfl: 1 ?  aspirin EC 81 MG tablet, Take 81 mg by mouth 3 (three) times a week., Disp: , Rfl:  ?  atorvastatin (LIPITOR) 40 MG tablet, TAKE 1 TABLET BY MOUTH AT BEDTIME, Disp: 30 tablet, Rfl: 5 ?  clobetasol cream (TEMOVATE) 0.05 %, APPLY TO THE AFFECTED AREA(S) TWICE DAILY UNTIL PSORIATIC LESION RESOLVED, Disp: 120 g, Rfl: 2 ?  lisinopril (ZESTRIL) 30 MG tablet, Take 1 tablet (30 mg total) by mouth daily. NOTE CHANGE IN DOSE, Disp: 90 tablet, Rfl: 1 ?  polyethylene glycol (MIRALAX / GLYCOLAX) 17 g packet, Take 17 g by mouth daily., Disp: , Rfl:  ?  primidone (MYSOLINE) 50 MG tablet, TAKE 1 AND 1/2 TABLETS BY MOUTH THREE TIMES DAILY, Disp: 405 tablet, Rfl: 0 ?  propranolol (INDERAL) 20 MG tablet, TAKE 1 TABLET BY MOUTH TWICE DAILY, Disp: 60 tablet,  Rfl: 2 ?  warfarin (COUMADIN) 5 MG tablet, Take 1.5 tablets (7.5 mg total) by mouth daily at 4 PM. On Monday, Tuesday, and Saturday take 1 tablet., Disp: 135 tablet, Rfl: 3 ?Social History  ? ?Socioeconomic History  ? Marital status: Widowed  ?  Spouse name: Not on file  ? Number of children: 3  ? Years of education: Not on file  ? Highest education level: Not on file  ?Occupational History  ? Occupation: Retired  ?Tobacco Use  ? Smoking status: Never  ? Smokeless tobacco: Never  ?Vaping Use  ? Vaping Use: Never used  ?Substance and Sexual Activity  ? Alcohol use: Never  ? Drug use: Never  ? Sexual activity: Not Currently  ?Other Topics Concern  ? Not on file  ?Social History Narrative  ? Not on file  ? ?Social  Determinants of Health  ? ?Financial Resource Strain: Not on file  ?Food Insecurity: Not on file  ?Transportation Needs: Not on file  ?Physical Activity: Not on file  ?Stress: Not on file  ?Social Connections: Not on file  ?Intimate Partner Violence: Not on file  ? ?Family History  ?Problem Relation Age of Onset  ? Dementia Mother   ? Stroke Father   ? Heart disease Father   ? Diabetes Sister   ? Stroke Son   ? ? ?Objective: ?Office vital signs reviewed. ?BP 137/78   Pulse 68   Temp 98 ?F (36.7 ?C)   Ht 5' (1.524 m)   Wt 137 lb 3.2 oz (62.2 kg)   SpO2 93%   BMI 26.80 kg/m?  ? ?Physical Examination:  ?General: Awake, alert, elderly female, No acute distress ?HEENT: Face with multiple postinflammatory changes ?Cardio: regular rate and rhythm, S1S2 heard, no murmurs appreciated ?Pulm: clear to auscultation bilaterally, no wheezes, rhonchi or rales; normal work of breathing on room air ?Skin: She has multiple postbiopsy sites.  Slightly exudative but appears to be serosanguineous. ? ?Assessment/ Plan: ?88 y.o. female  ? ?Longstanding persistent atrial fibrillation ([place]) - Plan: CoaguChek XS/INR Waived, POCT INR ? ?History of CVA in adulthood - Plan: POCT INR ? ?Chronic anticoagulation - Plan: CoaguChek XS/INR Waived, POCT INR ? ?Encounter for monitoring Coumadin therapy - Plan: CoaguChek XS/INR Waived, POCT INR ? ?Multiple wounds of skin ? ?INR is therapeutic.  Continue current regimen.  Follow-up in 6 weeks and this has been scheduled ? ?The wounds were dressed today with Xeroform, nonstick bandages and she was sent home with supplies.  Would err on the side of caution when it comes to adhesive bandages as I do think that her skin is so thin that she will continue to tear and have poor healing with repetitive irritation and trauma.  No evidence of secondary bacterial infection on exam.  Continue adequate wound care to promote healing ? ?No orders of the defined types were placed in this encounter. ? ?No orders of  the defined types were placed in this encounter. ? ? ? ?[name], DO ?[place] ?[phone] ? ?

## 2021-05-28 ENCOUNTER — Other Ambulatory Visit: Payer: Self-pay | Admitting: Family Medicine

## 2021-05-28 DIAGNOSIS — I10 Essential (primary) hypertension: Secondary | ICD-10-CM

## 2021-05-28 DIAGNOSIS — I4811 Longstanding persistent atrial fibrillation: Secondary | ICD-10-CM

## 2021-06-01 IMAGING — US US EXTREM LOW VENOUS*R*
1 series · 13 of 24 positions shown · non-contrast
Comparison: None.

CLINICAL DATA: Lower extremity pain and edema

EXAM:
RIGHT LOWER EXTREMITY VENOUS DUPLEX ULTRASOUND
TECHNIQUE: Gray-scale sonography with graded compression, as well as color
Doppler and duplex ultrasound were performed to evaluate the right
lower extremity deep venous system from the level of the common
femoral vein and including the common femoral, femoral, profunda
femoral, popliteal and calf veins including the posterior tibial,
peroneal and gastrocnemius veins when visible. The superficial great
saphenous vein was also interrogated. Spectral Doppler was utilized
to evaluate flow at rest and with distal augmentation maneuvers in
the common femoral, femoral and popliteal veins.

[Series 1: us extrem low venous*right* · 0.08mm/px · 13 of 39 slices shown]
[im 1/39]
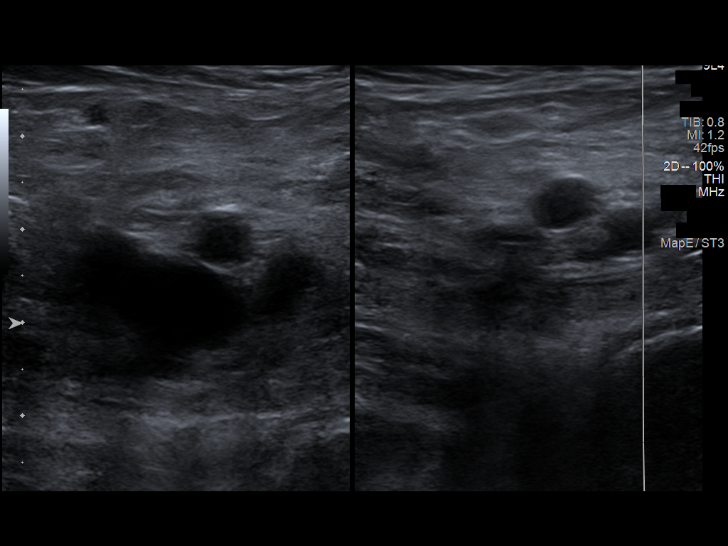
[im 4/39]
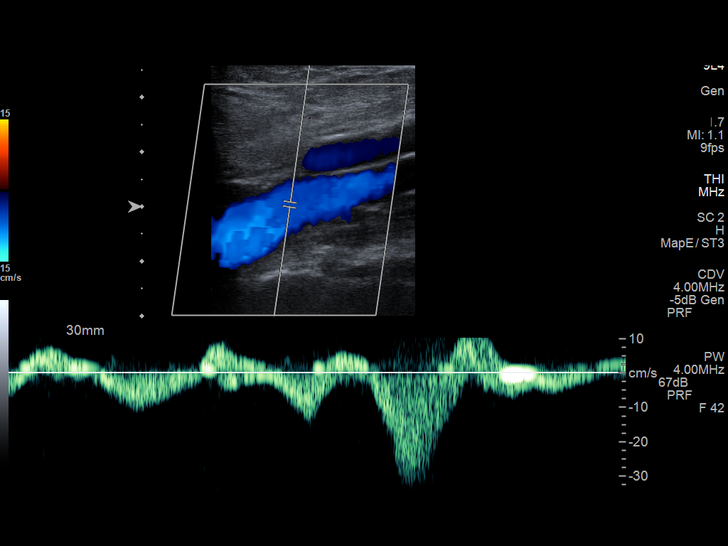
[im 7/39]
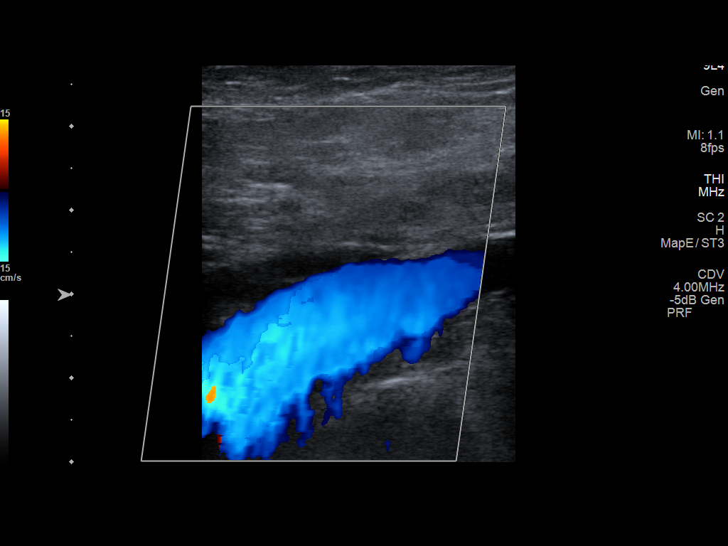
[im 10/39]
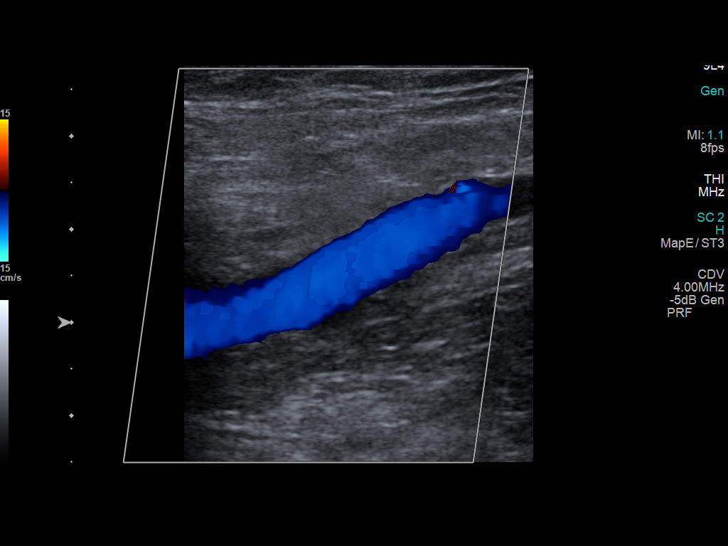
[im 14/39]
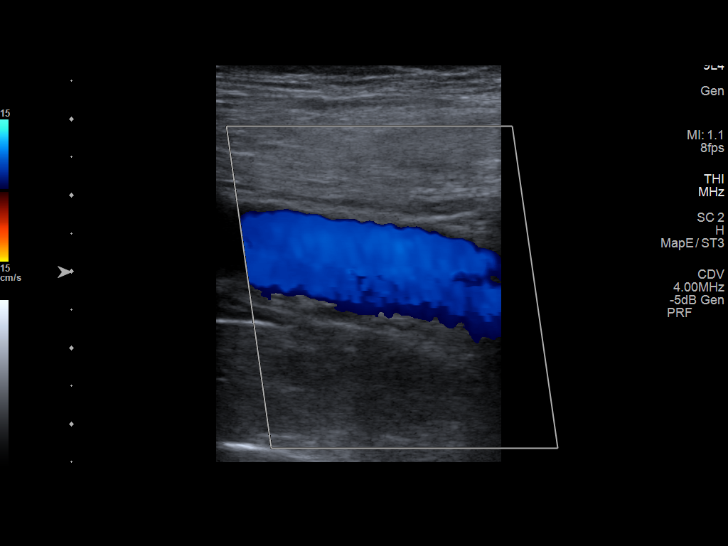
[im 17/39]
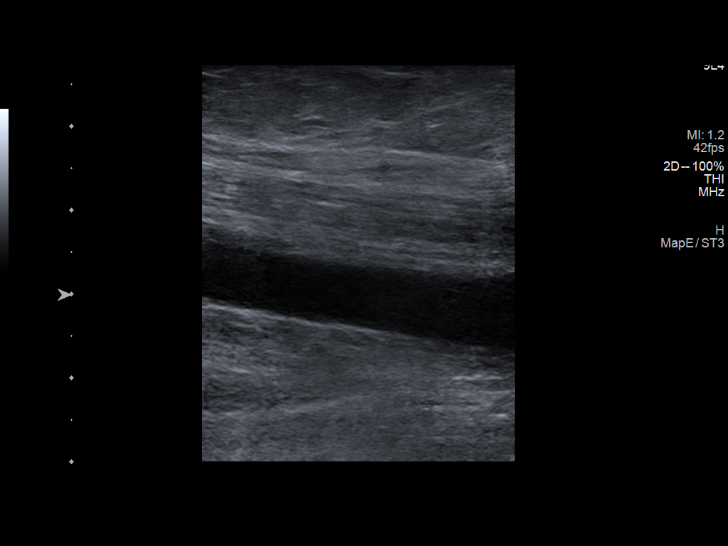
[im 20/39]
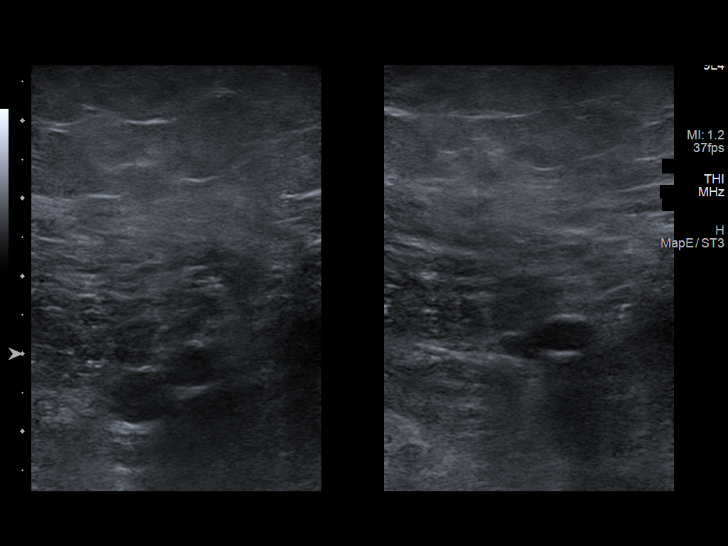
[im 22/39]
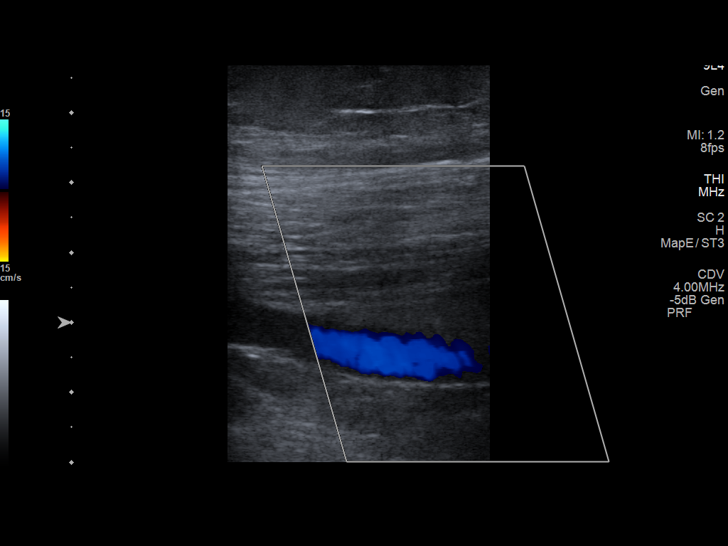
[im 25/39]
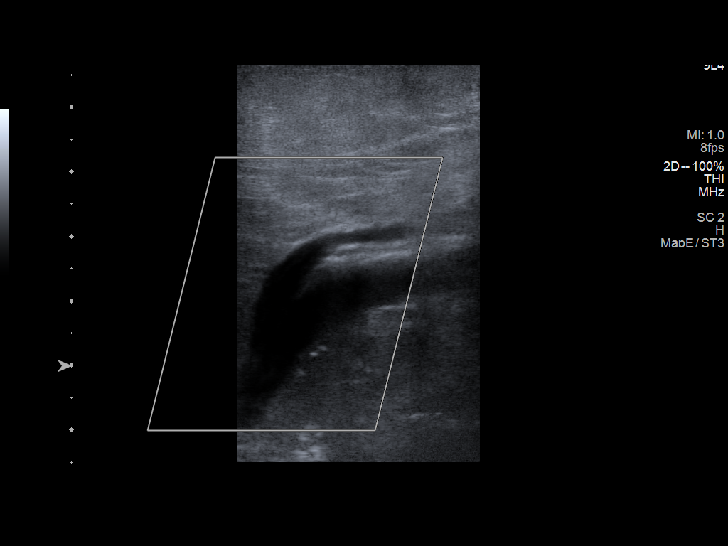
[im 29/39]
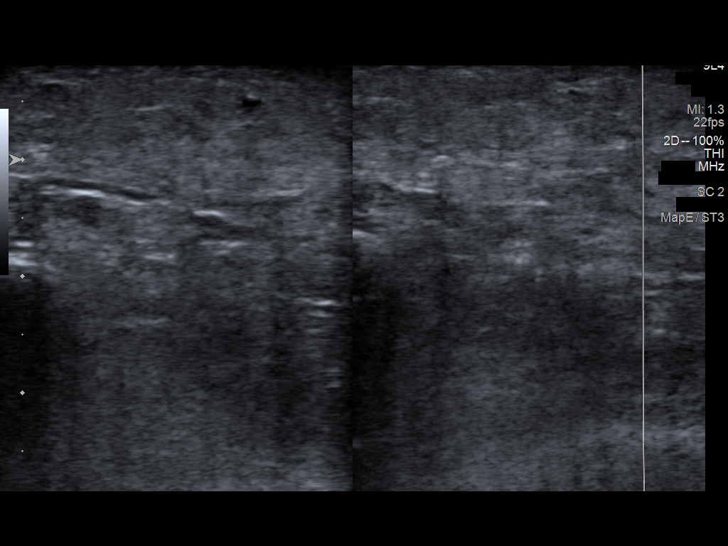
[im 32/39]
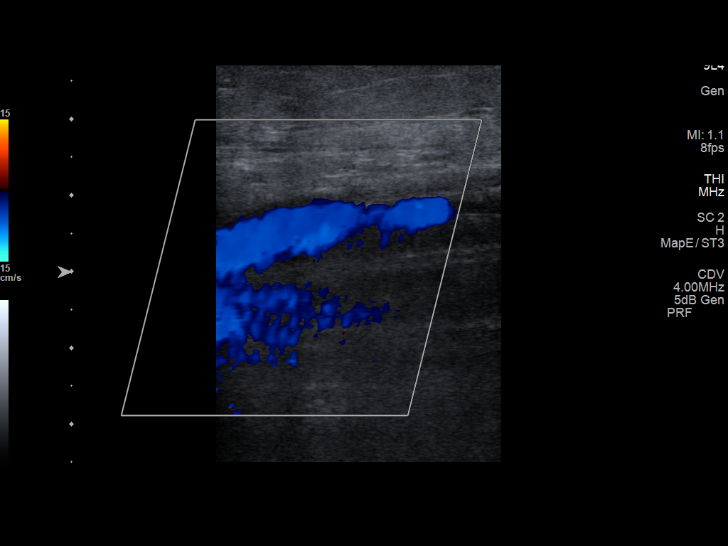
[im 35/39]
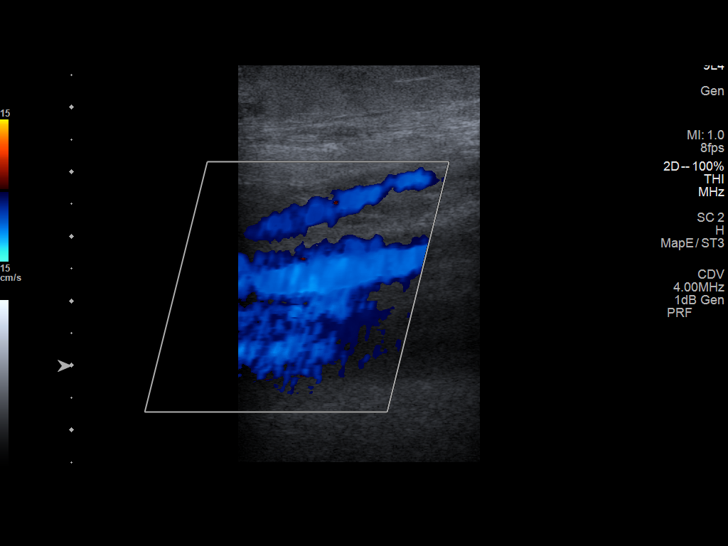
[im 39/39]
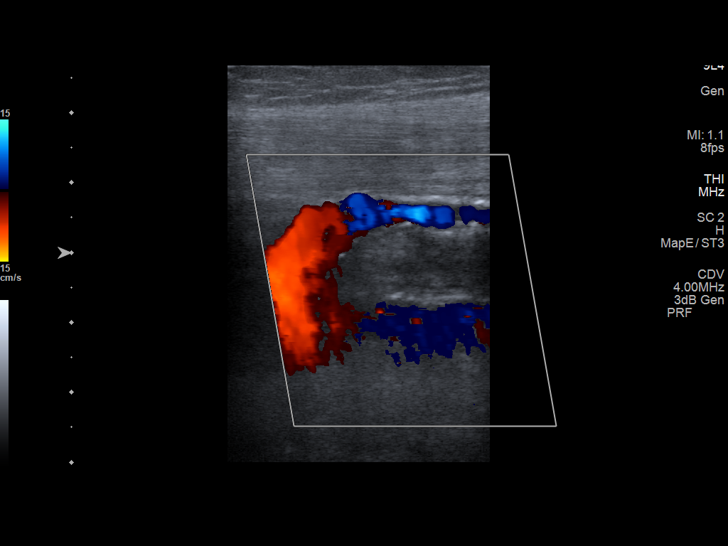

[13 of 24 positions shown; findings below may reference images not displayed]

FINDINGS: Contralateral Common Femoral Vein: Respiratory phasicity is normal
and symmetric with the symptomatic side. No evidence of thrombus.
Normal compressibility.

Common Femoral Vein: No evidence of thrombus. Normal
compressibility, respiratory phasicity and response to augmentation.

Saphenofemoral Junction: No evidence of thrombus. Normal
compressibility and flow on color Doppler imaging.

Profunda Femoral Vein: No evidence of thrombus. Normal
compressibility and flow on color Doppler imaging.

Femoral Vein: No evidence of thrombus. Normal compressibility,
respiratory phasicity and response to augmentation.

Popliteal Vein: No evidence of thrombus. Normal compressibility,
respiratory phasicity and response to augmentation.

Calf Veins: No evidence of thrombus. Normal compressibility and flow
on color Doppler imaging.

Superficial Great Saphenous Vein: No evidence of thrombus. Normal
compressibility.

Venous Reflux:  None.

Other Findings:  None.
IMPRESSION: No evidence of deep venous thrombosis in the right lower extremity.
Left common femoral vein also patent.

## 2021-06-10 ENCOUNTER — Telehealth: Payer: Self-pay

## 2021-06-10 NOTE — Telephone Encounter (Signed)
PHARMACY CALLED TO LET us KNOW MANUFACTORY CHANGED FOR WARFARIN SO THEY SAY IF NUMBERS CHANGE THIS IS WHY

## 2021-06-12 DIAGNOSIS — I739 Peripheral vascular disease, unspecified: Secondary | ICD-10-CM | POA: Diagnosis not present

## 2021-06-12 DIAGNOSIS — M79671 Pain in right foot: Secondary | ICD-10-CM | POA: Diagnosis not present

## 2021-06-12 DIAGNOSIS — M79672 Pain in left foot: Secondary | ICD-10-CM | POA: Diagnosis not present

## 2021-06-12 DIAGNOSIS — L110 Acquired keratosis follicularis: Secondary | ICD-10-CM | POA: Diagnosis not present

## 2021-06-23 ENCOUNTER — Ambulatory Visit (INDEPENDENT_AMBULATORY_CARE_PROVIDER_SITE_OTHER): Payer: Medicare Other | Admitting: Family Medicine

## 2021-06-23 ENCOUNTER — Encounter: Payer: Self-pay | Admitting: Family Medicine

## 2021-06-23 VITALS — BP 137/72 | HR 55 | Temp 97.6°F | Ht 60.0 in | Wt 133.4 lb

## 2021-06-23 DIAGNOSIS — R001 Bradycardia, unspecified: Secondary | ICD-10-CM

## 2021-06-23 DIAGNOSIS — R791 Abnormal coagulation profile: Secondary | ICD-10-CM

## 2021-06-23 DIAGNOSIS — I4811 Longstanding persistent atrial fibrillation: Secondary | ICD-10-CM | POA: Diagnosis not present

## 2021-06-23 LAB — POCT INR: INR: 1.3 — AB (ref 2–3)

## 2021-06-23 LAB — COAGUCHEK XS/INR WAIVED
INR: 1.3 — ABNORMAL HIGH (ref 0.9–1.1)
Prothrombin Time: 16.1 s

## 2021-06-23 MED ORDER — PROPRANOLOL HCL 20 MG PO TABS: 10.0000 mg | ORAL_TABLET | Freq: Two times a day (BID) | ORAL | 0 refills | Status: DC

## 2021-06-23 NOTE — Progress Notes (Signed)
Subjective: CC: INR check PCP: [name], DO NIO:EVOJ Rettinger is a 88 y.o. female presenting to clinic today for:  1.  Atrial fibrillation Patient is compliant with her Coumadin, propranolol.  She has quite a bit of bruising with even the littlest bump but does not report any bleeding, heart palpitations.  She did have an episode of dizziness when she was coming out of the shower.  Her blood pressure was slightly elevated at that time to 152/91 but her pulse was only 49.  Upon recheck of the blood pressure that had normalized but the pulse remained low.  She is treated with propranolol for both tremor and A-fib.  Has an appoint with Dr. Harl Bowie on Friday  ROS: Per HPI  Allergies  Allergen Reactions   Codeine Other (See Comments)   Influenza Vaccines    Sulfa Antibiotics Hives   Past Medical History:  Diagnosis Date   Acute cystitis with hematuria 11/22/2020   Atrial fibrillation ([place])    BCC (basal cell carcinoma of skin) 11/14/2004   Right Mid Cheek(Cx3,5FU)   BCC (basal cell carcinoma of skin) 04/16/2011   Right Crease(tx p bx)   BCC (basal cell carcinoma of skin) 06/16/2016   Left Nare(tx p bx)   BCC (basal cell carcinoma of skin) Atypical Basaloid 09/29/2018   Left Side Nose(tx p bx)   Hypertension    Osteoporosis    Pneumonia of lower lobe due to infectious organism 11/20/2020   SCC (squamous cell carcinoma) 09/29/2018   Left Jawline(tx p bx)   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   Right Jawline(Cx3,5FU) and Left Upper Cheek(Cx3,5FU)   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   Left Jawline(tx p bx), Nose(tx p bx), Right Cheek(tx p bx), and Right Forearm(tx p bx)   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   Right Temple(Cx3,5FU) and Left Outer Eye(Cx3,5FU)   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   Right Forearm(Cx3,5FU)   Squamous cell carcinoma in situ (SCCIS) 03/13/2005   Left Side of Nose(Cx3,5FU)   Squamous cell  carcinoma in situ (SCCIS) 06/04/2005   Inner Right Cheek/Eye(Tx p Bx)   Squamous cell carcinoma in situ (SCCIS) 02/12/2010   Right Cheek(tx p bx)   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 2 09/29/2018   Upper Lip(tx p bx) and Bridge of Nose(tx p bx)   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   Left Forehead, Left Neck, Right Nose, and Right Hand   Stroke ([place])    Tremor    Weakness 11/20/2020    Current Outpatient Medications:    amLODipine (NORVASC) 5 MG tablet, TAKE 1 AND 1/2 TABLETS BY MOUTH DAILY, Disp: 135 tablet, Rfl: 0   aspirin EC 81 MG tablet, Take 81 mg by mouth 3 (three) times a week., Disp: , Rfl:    atorvastatin (LIPITOR) 40 MG tablet, TAKE 1 TABLET BY MOUTH AT BEDTIME, Disp: 30 tablet, Rfl: 5   clobetasol cream (TEMOVATE) 0.05 %, APPLY TO THE AFFECTED AREA(S) TWICE DAILY UNTIL PSORIATIC LESION RESOLVED, Disp: 120 g, Rfl: 2   lisinopril (ZESTRIL) 30 MG tablet, Take 1 tablet (30 mg total) by mouth daily. NOTE CHANGE IN DOSE, Disp: 90 tablet, Rfl: 1   polyethylene glycol (MIRALAX / GLYCOLAX) 17 g packet, Take 17 g by mouth daily., Disp: , Rfl:    primidone (MYSOLINE) 50 MG tablet, TAKE 1 AND 1/2 TABLETS BY MOUTH THREE TIMES DAILY, Disp: 405 tablet, Rfl:  0   propranolol (INDERAL) 20 MG tablet, TAKE 1 TABLET BY MOUTH TWICE DAILY, Disp: 180 tablet, Rfl: 0   warfarin (COUMADIN) 5 MG tablet, Take 1.5 tablets (7.5 mg total) by mouth daily at 4 PM. On Monday, Tuesday, and Saturday take 1 tablet., Disp: 135 tablet, Rfl: 3 Social History   Socioeconomic History   Marital status: Widowed    Spouse name: Not on file   Number of children: 3   Years of education: Not on file   Highest education level: Not on file  Occupational History   Occupation: Retired  Tobacco Use   Smoking status: Never   Smokeless tobacco: Never  Vaping Use   Vaping Use: Never used  Substance and Sexual Activity   Alcohol use: Never   Drug use: Never   Sexual activity: Not Currently   Other Topics Concern   Not on file  Social History Narrative   Not on file   Social Determinants of Health   Financial Resource Strain: Not on file  Food Insecurity: Not on file  Transportation Needs: Not on file  Physical Activity: Not on file  Stress: Not on file  Social Connections: Not on file  Intimate Partner Violence: Not on file   Family History  Problem Relation Age of Onset   Dementia Mother    Stroke Father    Heart disease Father    Diabetes Sister    Stroke Son     Objective: Office vital signs reviewed. BP 137/72   Pulse (!) 55   Temp 97.6 F (36.4 C)   Ht 5' (1.524 m)   Wt 133 lb 6.4 oz (60.5 kg)   SpO2 95%   BMI 26.05 kg/m   Physical Examination:  General: Awake, alert, nontoxic appearing elderly female, No acute distress HEENT: sclera white, MMM Cardio: irregularly irregular with bradycardic rhythm.  S1S2 heard, no murmurs appreciated Pulm: clear to auscultation bilaterally, no wheezes, rhonchi or rales; normal work of breathing on room air Skin: Multiple petechiae and abrasions noted along the lower extremities.  Assessment/ Plan: 88 y.o. female   Subtherapeutic international normalized ratio (INR) - Plan: POCT INR  Longstanding persistent atrial fibrillation ([place]) - Plan: CoaguChek XS/INR Waived, POCT INR, propranolol (INDERAL) 20 MG tablet  Bradycardia  Subtherapeutic INR, likely secondary to the change in manufacturer of her Coumadin.  I discussed with her daughter that she will increase her Coumadin to 1.5 tablets daily except for 1 tablet daily on Saturdays.  We have set her up for next Tuesday for repeat INR testing.  Additionally, bradycardic on exam.  I would like her to reduce her Inderal to 10 mg twice daily.  She will be reassessed on Friday by her cardiologist and if persistently bradycardic and having symptomology I recommend coming off of the beta-blocker totally and relying solely on her primidone for tremor control as I think  that the risk of the medication would outweigh the benefit at that point.  Both she and her daughter voiced good understanding and will follow-up accordingly  Orders Placed This Encounter  Procedures   CoaguChek XS/INR Waived   No orders of the defined types were placed in this encounter.    [name], DO [place] [phone]

## 2021-06-23 NOTE — Patient Instructions (Signed)
1.5 tablets daily EVERY day EXCEPT only 1 tablet on SAT now since the blood is too thick (INR 1.3 today). Goal INR 2-3. REDUCE Propranolol to '10mg'$  twice daily (1/2 tablet of the 20 mg)

## 2021-06-27 ENCOUNTER — Other Ambulatory Visit: Payer: Self-pay | Admitting: Cardiology

## 2021-06-27 ENCOUNTER — Ambulatory Visit (INDEPENDENT_AMBULATORY_CARE_PROVIDER_SITE_OTHER): Payer: Medicare Other

## 2021-06-27 ENCOUNTER — Ambulatory Visit (INDEPENDENT_AMBULATORY_CARE_PROVIDER_SITE_OTHER): Payer: Medicare Other | Admitting: Cardiology

## 2021-06-27 ENCOUNTER — Encounter: Payer: Self-pay | Admitting: Cardiology

## 2021-06-27 VITALS — BP 102/56 | HR 51 | Ht 60.0 in | Wt 134.8 lb

## 2021-06-27 DIAGNOSIS — I10 Essential (primary) hypertension: Secondary | ICD-10-CM | POA: Diagnosis not present

## 2021-06-27 DIAGNOSIS — I4891 Unspecified atrial fibrillation: Secondary | ICD-10-CM | POA: Diagnosis not present

## 2021-06-27 DIAGNOSIS — R001 Bradycardia, unspecified: Secondary | ICD-10-CM

## 2021-06-27 NOTE — Progress Notes (Signed)
Clinical Summary Dominique Farley is a 88 y.o.female seen today for follow up of the following medical problems.      1. Long standing persistent afib - diagnosed 10/2018 during 10/2018 - no recent palpitations - no lightheadendess or dizziness. - doing well on coumadin. Could not use DOAC due to being on primidone. INR follwoed by pcp     - low HRs recent pcp visit, lowered propranolol to '10mg'$  bid. From pcp note would be ok coming off propanolol if neccesary and treating tremor with just primidone.  - feels nervous inside.  - no bleeding on coumadin.    2. History of CVA - has been on ASA '81mg'$  every other day it appears - admit 10/2018 at [facility], left middle cerebral. Recs were for coumadin at that time and ASA every today.  - left MCA thrombectomy   3.HTN - has some component of white coat HTN - compliant with meds     4. Tremor - she is on propranolol, primidone         Past Medical History:  Diagnosis Date   Acute cystitis with hematuria 11/22/2020   Atrial fibrillation ([facility])    BCC (basal cell carcinoma of skin) 11/14/2004   Right Mid Cheek(Cx3,5FU)   BCC (basal cell carcinoma of skin) 04/16/2011   Right Crease(tx p bx)   BCC (basal cell carcinoma of skin) 06/16/2016   Left Nare(tx p bx)   BCC (basal cell carcinoma of skin) Atypical Basaloid 09/29/2018   Left Side Nose(tx p bx)   Hypertension    Osteoporosis    Pneumonia of lower lobe due to infectious organism 11/20/2020   SCC (squamous cell carcinoma) 09/29/2018   Left Jawline(tx p bx)   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   Right Jawline(Cx3,5FU) and Left Upper Cheek(Cx3,5FU)   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   Left Jawline(tx p bx), Nose(tx p bx), Right Cheek(tx p bx), and Right Forearm(tx p bx)   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   Right Temple(Cx3,5FU) and Left Outer Eye(Cx3,5FU)   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   Right  Forearm(Cx3,5FU)   Squamous cell carcinoma in situ (SCCIS) 03/13/2005   Left Side of Nose(Cx3,5FU)   Squamous cell carcinoma in situ (SCCIS) 06/04/2005   Inner Right Cheek/Eye(Tx p Bx)   Squamous cell carcinoma in situ (SCCIS) 02/12/2010   Right Cheek(tx p bx)   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     atorvastatin (LIPITOR) 40 MG tablet TAKE 1 TABLET BY MOUTH AT BEDTIME 30 tablet 5   clobetasol cream (TEMOVATE) 0.05 % APPLY TO THE AFFECTED AREA(S) TWICE DAILY UNTIL PSORIATIC LESION RESOLVED 120 g 2   lisinopril (ZESTRIL) 30 MG tablet Take 1 tablet (30  mg total) by mouth daily. NOTE CHANGE IN DOSE 90 tablet 1   polyethylene glycol (MIRALAX / GLYCOLAX) 17 g packet Take 17 g by mouth daily.     primidone (MYSOLINE) 50 MG tablet TAKE 1 AND 1/2 TABLETS BY MOUTH THREE TIMES DAILY 405 tablet 0   propranolol (INDERAL) 20 MG tablet Take 0.5 tablets (10 mg total) by mouth 2 (two) times daily. 180 tablet 0   warfarin (COUMADIN) 5 MG tablet Take 1.5 tablets (7.5 mg total) by mouth daily at 4 PM. On Monday, Tuesday, and Saturday take 1 tablet. 135 tablet 3   No current facility-administered medications for this visit.     Past Surgical History:  Procedure Laterality Date   CHOLECYSTECTOMY     skin biopsies       Allergies  Allergen Reactions   Codeine Other (See Comments)   Influenza Vaccines    Sulfa Antibiotics Hives       Family History  Problem Relation Age of Onset   Dementia Mother    Stroke Father    Heart disease Father    Diabetes Sister    Stroke Son      Social History [name] reports that she has never smoked. She has never used smokeless tobacco. [name] reports no history of alcohol use.   Review of Systems CONSTITUTIONAL: No weight loss, fever, chills, weakness or fatigue.  HEENT: Eyes: No visual loss, blurred vision, double vision or yellow sclerae.No hearing loss, sneezing, congestion, runny nose or sore throat.  SKIN: No rash or itching.  CARDIOVASCULAR: per hpi RESPIRATORY: No shortness of breath, cough or sputum.  GASTROINTESTINAL: No anorexia, nausea, vomiting or diarrhea. No abdominal pain or blood.  GENITOURINARY: No burning on urination, no polyuria NEUROLOGICAL: No headache, dizziness, syncope, paralysis, ataxia, numbness or tingling in the extremities. No change in bowel or bladder control.  MUSCULOSKELETAL: No muscle, back pain, joint pain or stiffness.  LYMPHATICS: No enlarged nodes. No history of splenectomy.  PSYCHIATRIC: No history of depression or anxiety.  ENDOCRINOLOGIC: No reports of sweating, cold or heat intolerance. No polyuria or polydipsia.  Marland Kitchen   Physical Examination Today's Vitals   06/27/21 1547  BP: (!) 102/56  Pulse: (!) 51  SpO2: 90%  Weight: 134 lb 12.8 oz (61.1 kg)  Height: 5' (1.524 m)   Body mass index is 26.33 kg/m.  Gen: resting comfortably, no acute distress HEENT: no scleral icterus, pupils equal round and reactive, no palptable cervical adenopathy,  CV: irreg, no mr/g no jvd Resp: Clear to auscultation bilaterally GI: abdomen is soft, non-tender, non-distended, normal bowel sounds, no hepatosplenomegaly MSK: extremities are warm, no edema.  Skin: warm, no rash Neuro:  no focal deficits Psych: appropriate affect   Diagnostic Studies     Assessment and Plan   1. Afib - on coumadin, could not take DOAC due to  interaction with primidone - low HRs at times, will obtain 72 hr monitor to better evaluate. If neccesary may have to stop beta blocker, pcp was ok to stop and treat tremor without it.    2. HTN - at goal, continue current meds       Arnoldo Lenis, M.D.

## 2021-06-27 NOTE — Patient Instructions (Signed)
Medication Instructions:  Continue all current medications.  Labwork: none  Testing/Procedures: Your physician has recommended that you wear a 3 day event monitor. Event monitors are medical devices that record the heart's electrical activity. Doctors most often [place] these monitors to diagnose arrhythmias. Arrhythmias are problems with the speed or rhythm of the heartbeat. The monitor is a small, portable device. You can wear one while you do your normal daily activities. This is usually used to diagnose what is causing palpitations/syncope (passing out). Office will contact with results via phone or letter.     Follow-Up: 6 months   Any Other Special Instructions Will Be Listed Below (If Applicable).   If you need a refill on your cardiac medications before your next appointment, please call your pharmacy.

## 2021-07-01 ENCOUNTER — Ambulatory Visit (INDEPENDENT_AMBULATORY_CARE_PROVIDER_SITE_OTHER): Payer: Medicare Other | Admitting: Family Medicine

## 2021-07-01 ENCOUNTER — Encounter: Payer: Self-pay | Admitting: Family Medicine

## 2021-07-01 VITALS — BP 131/70 | HR 82 | Temp 97.2°F | Ht 60.0 in | Wt 134.8 lb

## 2021-07-01 DIAGNOSIS — I4811 Longstanding persistent atrial fibrillation: Secondary | ICD-10-CM

## 2021-07-01 DIAGNOSIS — Z5181 Encounter for therapeutic drug level monitoring: Secondary | ICD-10-CM

## 2021-07-01 DIAGNOSIS — Z7901 Long term (current) use of anticoagulants: Secondary | ICD-10-CM

## 2021-07-01 DIAGNOSIS — R791 Abnormal coagulation profile: Secondary | ICD-10-CM | POA: Diagnosis not present

## 2021-07-01 DIAGNOSIS — Z8673 Personal history of transient ischemic attack (TIA), and cerebral infarction without residual deficits: Secondary | ICD-10-CM | POA: Diagnosis not present

## 2021-07-01 LAB — COAGUCHEK XS/INR WAIVED
INR: 2 — ABNORMAL HIGH (ref 0.9–1.1)
Prothrombin Time: 23.4 s

## 2021-07-01 LAB — POCT INR: INR: 2 (ref 2–3)

## 2021-07-01 NOTE — Progress Notes (Signed)
Subjective: CC: INR check PCP: Dominique Norlander, DO PJK:DTOI Dominique Farley is a 88 y.o. female presenting to clinic today for:  1.  Atrial fibrillation Goal INR 2-3 Patient is treated with Coumadin.  At her last visit her INR was subtherapeutic.  She is here for interval checkup.  Coumadin was advanced to 1.5 tablets, to total 7.5 mg, daily except for 1 tablet on Saturdays, to total 5 mg.  She denies any bleeding.  Her skin continues to bruise easily infection gets a new bruise on her right knee after bumping into Dr. Harl Farley last week.  She will be getting a Zio patch soon to monitor her heart rate since she has been having some bradycardic issues.  However, her daughter notes that she has seem to positively respond to the reduction in the Inderal which was made last time.  Her heart rate has been better and her blood pressures have been holding pretty steadily in the 130s over 70s.    ROS: Per HPI  Allergies  Allergen Reactions   Codeine Other (See Comments)   Influenza Vaccines    Sulfa Antibiotics Hives   Past Medical History:  Diagnosis Date   Acute cystitis with hematuria 11/22/2020   Atrial fibrillation ([place])    BCC (basal cell carcinoma of skin) 11/14/2004   Right Mid Cheek(Cx3,5FU)   BCC (basal cell carcinoma of skin) 04/16/2011   Right Crease(tx p bx)   BCC (basal cell carcinoma of skin) 06/16/2016   Left Nare(tx p bx)   BCC (basal cell carcinoma of skin) Atypical Basaloid 09/29/2018   Left Side Nose(tx p bx)   Hypertension    Osteoporosis    Pneumonia of lower lobe due to infectious organism 11/20/2020   SCC (squamous cell carcinoma) 09/29/2018   Left Jawline(tx p bx)   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   Right Jawline(Cx3,5FU) and Left Upper Cheek(Cx3,5FU)   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   Left Jawline(tx p bx), Nose(tx p bx), Right Cheek(tx p bx), and Right Forearm(tx p bx)   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   Right  Temple(Cx3,5FU) and Left Outer Eye(Cx3,5FU)   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   Right Forearm(Cx3,5FU)   Squamous cell carcinoma in situ (SCCIS) 03/13/2005   Left Side of Nose(Cx3,5FU)   Squamous cell carcinoma in situ (SCCIS) 06/04/2005   Inner Right Cheek/Eye(Tx p Bx)   Squamous cell carcinoma in situ (SCCIS) 02/12/2010   Right Cheek(tx p bx)   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 2 09/29/2018   Upper Lip(tx p bx) and Bridge of Nose(tx p bx)   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   Left Forehead, Left Neck, Right Nose, and Right Hand   Stroke ([place])    Tremor    Weakness 11/20/2020    Current Outpatient Medications:    amLODipine (NORVASC) 5 MG tablet, TAKE 1 AND 1/2 TABLETS BY MOUTH DAILY, Disp: 135 tablet, Rfl: 0   aspirin EC 81 MG tablet, Take 81 mg by mouth 3 (three) times a week., Disp: , Rfl:    atorvastatin (LIPITOR) 40 MG tablet, TAKE 1 TABLET BY MOUTH AT BEDTIME, Disp: 30 tablet, Rfl: 5   clobetasol cream (TEMOVATE) 0.05 %, APPLY TO THE AFFECTED AREA(S) TWICE DAILY UNTIL PSORIATIC LESION RESOLVED, Disp: 120 g, Rfl: 2   lisinopril (ZESTRIL) 30 MG tablet, Take 1 tablet (30 mg total) by mouth daily. NOTE CHANGE IN DOSE, Disp:  90 tablet, Rfl: 1   polyethylene glycol (MIRALAX / GLYCOLAX) 17 g packet, Take 17 g by mouth daily., Disp: , Rfl:    primidone (MYSOLINE) 50 MG tablet, TAKE 1 AND 1/2 TABLETS BY MOUTH THREE TIMES DAILY, Disp: 405 tablet, Rfl: 0   propranolol (INDERAL) 20 MG tablet, Take 0.5 tablets (10 mg total) by mouth 2 (two) times daily., Disp: 180 tablet, Rfl: 0   warfarin (COUMADIN) 5 MG tablet, Take 1.5 tablets (7.5 mg total) by mouth daily at 4 PM. On Monday, Tuesday, and Saturday take 1 tablet., Disp: 135 tablet, Rfl: 3 Social History   Socioeconomic History   Marital status: Widowed    Spouse name: Not on file   Number of children: 3   Years of education: Not on file   Highest education level: Not on file  Occupational  History   Occupation: Retired  Tobacco Use   Smoking status: Never   Smokeless tobacco: Never  Vaping Use   Vaping Use: Never used  Substance and Sexual Activity   Alcohol use: Never   Drug use: Never   Sexual activity: Not Currently  Other Topics Concern   Not on file  Social History Narrative   Not on file   Social Determinants of Health   Financial Resource Strain: Not on file  Food Insecurity: Not on file  Transportation Needs: Not on file  Physical Activity: Not on file  Stress: Not on file  Social Connections: Not on file  Intimate Partner Violence: Not on file   Family History  Problem Relation Age of Onset   Dementia Mother    Stroke Father    Heart disease Father    Diabetes Sister    Stroke Son     Objective: Office vital signs reviewed. BP 131/70   Pulse 82   Temp (!) 97.2 F (36.2 C)   Ht 5' (1.524 m)   Wt 134 lb 12.8 oz (61.1 kg)   SpO2 95%   BMI 26.33 kg/m   Physical Examination:  General: Awake, alert, well-appearing elderly female, No acute distress HEENT: Sclera white Cardio: Irregularly irregular with rate control, S1S2 heard, no murmurs appreciated Pulm: clear to auscultation bilaterally, no wheezes, rhonchi or rales; normal work of breathing on room air Skin: Various areas of ecchymosis along the lower extremities and upper extremities with very large ecchymotic area along the right lateral knee   Assessment/ Plan: 88 y.o. female   Longstanding persistent atrial fibrillation ([facility]) - Plan: CoaguChek XS/INR Waived  Anticoagulation goal of INR 2 to 3  History of CVA in adulthood - Plan: CoaguChek XS/INR Waived  INR therapeutic at 2.0 today.  Continue current regimen.  May follow-up in 6 weeks  Orders Placed This Encounter  Procedures   CoaguChek XS/INR Waived   No orders of the defined types were placed in this encounter.    Dominique Norlander, DO [place] [phone]

## 2021-07-04 DIAGNOSIS — R001 Bradycardia, unspecified: Secondary | ICD-10-CM

## 2021-07-08 ENCOUNTER — Telehealth: Payer: Self-pay | Admitting: Family Medicine

## 2021-07-08 DIAGNOSIS — I4811 Longstanding persistent atrial fibrillation: Secondary | ICD-10-CM

## 2021-07-08 MED ORDER — PROPRANOLOL HCL 20 MG PO TABS: 10.0000 mg | ORAL_TABLET | Freq: Two times a day (BID) | ORAL | 1 refills | Status: DC

## 2021-07-11 DIAGNOSIS — R001 Bradycardia, unspecified: Secondary | ICD-10-CM | POA: Diagnosis not present

## 2021-07-27 ENCOUNTER — Other Ambulatory Visit: Payer: Self-pay | Admitting: Family Medicine

## 2021-07-27 DIAGNOSIS — G250 Essential tremor: Secondary | ICD-10-CM

## 2021-07-30 ENCOUNTER — Ambulatory Visit: Payer: Medicare Other | Admitting: Physician Assistant

## 2021-08-01 ENCOUNTER — Ambulatory Visit: Payer: Medicare Other | Admitting: Family Medicine

## 2021-08-12 ENCOUNTER — Encounter: Payer: Self-pay | Admitting: Family Medicine

## 2021-08-12 ENCOUNTER — Ambulatory Visit (INDEPENDENT_AMBULATORY_CARE_PROVIDER_SITE_OTHER): Payer: Medicare Other | Admitting: Family Medicine

## 2021-08-12 VITALS — BP 115/72 | HR 79 | Temp 97.1°F | Ht 60.0 in | Wt 136.8 lb

## 2021-08-12 DIAGNOSIS — I4811 Longstanding persistent atrial fibrillation: Secondary | ICD-10-CM

## 2021-08-12 DIAGNOSIS — F4321 Adjustment disorder with depressed mood: Secondary | ICD-10-CM

## 2021-08-12 DIAGNOSIS — Z634 Disappearance and death of family member: Secondary | ICD-10-CM | POA: Diagnosis not present

## 2021-08-12 DIAGNOSIS — I10 Essential (primary) hypertension: Secondary | ICD-10-CM

## 2021-08-12 DIAGNOSIS — Z5181 Encounter for therapeutic drug level monitoring: Secondary | ICD-10-CM | POA: Diagnosis not present

## 2021-08-12 DIAGNOSIS — Z7901 Long term (current) use of anticoagulants: Secondary | ICD-10-CM | POA: Diagnosis not present

## 2021-08-12 LAB — POCT INR: INR: 1.8 — AB (ref 2–3)

## 2021-08-12 LAB — COAGUCHEK XS/INR WAIVED
INR: 1.8 — ABNORMAL HIGH (ref 0.9–1.1)
Prothrombin Time: 21 s

## 2021-08-12 NOTE — Patient Instructions (Signed)
2 tablets today. Then go back to 1.5 daily, except 1 tablet on Saturdays. We'll recheck again in 2 weeks.

## 2021-08-12 NOTE — Progress Notes (Signed)
 Subjective: CC: A-fib PCP: Gottschalk, Ashly M, DO HPI:Dominique Farley is a 88 y.o. female presenting to clinic today for:  1.  Atrial fibrillation Patient is chronically anticoagulated with goal INR of 2-3.  She is brought to the office by her daughter.  She continues to take 1.5 tablets daily except for 1 tablet on Saturdays.  No bleeding reported.  2.  Grief Patient reports the passing of her son, who is the eldest child.  She notes that he passed away from complications of a stroke.  He was a pastor and a very well loved member of the community.  She knows that he is "in a better place" but still feels the loss.   ROS: Per HPI  Allergies  Allergen Reactions   Codeine Other (See Comments)   Influenza Vaccines    Sulfa Antibiotics Hives   Past Medical History:  Diagnosis Date   Acute cystitis with hematuria 11/22/2020   Atrial fibrillation (HCC)    BCC (basal cell carcinoma of skin) 11/14/2004   Right Mid Cheek(Cx3,5FU)   BCC (basal cell carcinoma of skin) 04/16/2011   Right Crease(tx p bx)   BCC (basal cell carcinoma of skin) 06/16/2016   Left Nare(tx p bx)   BCC (basal cell carcinoma of skin) Atypical Basaloid 09/29/2018   Left Side Nose(tx p bx)   Hypertension    Osteoporosis    Pneumonia of lower lobe due to infectious organism 11/20/2020   SCC (squamous cell carcinoma) 09/29/2018   Left Jawline(tx p bx)   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   Right Jawline(Cx3,5FU) and Left Upper Cheek(Cx3,5FU)   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   Left Jawline(tx p bx), Nose(tx p bx), Right Cheek(tx p bx), and Right Forearm(tx p bx)   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   Right Temple(Cx3,5FU) and Left Outer Eye(Cx3,5FU)   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   Right Forearm(Cx3,5FU)   Squamous cell carcinoma in situ (SCCIS) 03/13/2005   Left Side of Nose(Cx3,5FU)   Squamous cell carcinoma in situ (SCCIS) 06/04/2005   Inner  Right Cheek/Eye(Tx p Bx)   Squamous cell carcinoma in situ (SCCIS) 02/12/2010   Right Cheek(tx p bx)   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 2 09/29/2018   Upper Lip(tx p bx) and Bridge of Nose(tx p bx)   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   Left Forehead, Left Neck, Right Nose, and Right Hand   Stroke (HCC)    Tremor    Weakness 11/20/2020    Current Outpatient Medications:    amLODipine (NORVASC) 5 MG tablet, TAKE 1 AND 1/2 TABLETS BY MOUTH DAILY, Disp: 135 tablet, Rfl: 0   aspirin EC 81 MG tablet, Take 81 mg by mouth 3 (three) times a week., Disp: , Rfl:    atorvastatin (LIPITOR) 40 MG tablet, TAKE 1 TABLET BY MOUTH AT BEDTIME, Disp: 30 tablet, Rfl: 5   clobetasol cream (TEMOVATE) 0.05 %, APPLY TO THE AFFECTED AREA(S) TWICE DAILY UNTIL PSORIATIC LESION RESOLVED, Disp: 120 g, Rfl: 2   lisinopril (ZESTRIL) 30 MG tablet, Take 1 tablet (30 mg total) by mouth daily. NOTE CHANGE IN DOSE, Disp: 90 tablet, Rfl: 1   polyethylene glycol (MIRALAX / GLYCOLAX) 17 g packet, Take 17 g by mouth daily., Disp: , Rfl:    primidone (MYSOLINE) 50 MG tablet, TAKE 1 AND 1/2 TABLETS BY MOUTH THREE TIMES DAILY, Disp: 405 tablet, Rfl: 2     propranolol (INDERAL) 20 MG tablet, Take 0.5 tablets (10 mg total) by mouth 2 (two) times daily., Disp: 90 tablet, Rfl: 1   warfarin (COUMADIN) 5 MG tablet, Take 1.5 tablets (7.5 mg total) by mouth daily at 4 PM. On Monday, Tuesday, and Saturday take 1 tablet., Disp: 135 tablet, Rfl: 3 Social History   Socioeconomic History   Marital status: Widowed    Spouse name: Not on file   Number of children: 3   Years of education: Not on file   Highest education level: Not on file  Occupational History   Occupation: Retired  Tobacco Use   Smoking status: Never   Smokeless tobacco: Never  Vaping Use   Vaping Use: Never used  Substance and Sexual Activity   Alcohol use: Never   Drug use: Never   Sexual activity: Not Currently  Other Topics Concern    Not on file  Social History Narrative   Not on file   Social Determinants of Health   Financial Resource Strain: Not on file  Food Insecurity: Not on file  Transportation Needs: Not on file  Physical Activity: Not on file  Stress: Not on file  Social Connections: Not on file  Intimate Partner Violence: Not on file   Family History  Problem Relation Age of Onset   Dementia Mother    Stroke Father    Heart disease Father    Diabetes Sister    Stroke Son     Objective: Office vital signs reviewed. BP 115/72   Pulse 79   Temp (!) 97.1 F (36.2 C)   Ht 5' (1.524 m)   Wt 136 lb 12.8 oz (62.1 kg)   SpO2 94%   BMI 26.72 kg/m   Physical Examination:  General: Awake, alert, well nourished, No acute distress HEENT: Sclera injected Cardio: Irregularly irregular with rate control, S1S2 heard, no murmurs appreciated Pulm: clear to auscultation bilaterally, no wheezes, rhonchi or rales; normal work of breathing on room air Psych: Tearful  Assessment/ Plan: 88 y.o. female   Longstanding persistent atrial fibrillation (HCC) - Plan: CoaguChek XS/INR Waived, CBC, POCT INR  Anticoagulation goal of INR 2 to 3 - Plan: CoaguChek XS/INR Waived, CBC, POCT INR  Chronic anticoagulation - Plan: CoaguChek XS/INR Waived, CBC, POCT INR  Essential hypertension - Plan: CMP14+EGFR, POCT INR  Grief at loss of child  INR subtherapeutic at 1.8 today.  Increase to 2 tablets today then resume 1.5 tablets daily except for 1 tablet on Saturdays.  Follow-up in 2 weeks, sooner if concerns arise  Blood pressure remains well controlled.  Understandably grieving the loss of her son.  She has good support by her family.  We will continue to monitor this closely and low threshold for grief counseling and or other interventions if needed  No orders of the defined types were placed in this encounter.  No orders of the defined types were placed in this encounter.    [name], DO [place] [phone]

## 2021-08-13 LAB — CBC
Hematocrit: 39.8 % (ref 34.0–46.6)
Hemoglobin: 12.9 g/dL (ref 11.1–15.9)
MCH: 32.1 pg (ref 26.6–33.0)
MCHC: 32.4 g/dL (ref 31.5–35.7)
MCV: 99 fL — ABNORMAL HIGH (ref 79–97)
Platelets: 257 10*3/uL (ref 150–450)
RBC: 4.02 x10E6/uL (ref 3.77–5.28)
RDW: 12.9 % (ref 11.7–15.4)
WBC: 7.6 10*3/uL (ref 3.4–10.8)

## 2021-08-13 LAB — CMP14+EGFR
ALT: 20 IU/L (ref 0–32)
AST: 21 IU/L (ref 0–40)
Albumin/Globulin Ratio: 1.7 (ref 1.2–2.2)
Albumin: 4.1 g/dL (ref 3.6–4.6)
Alkaline Phosphatase: 105 IU/L (ref 44–121)
BUN/Creatinine Ratio: 29 — ABNORMAL HIGH (ref 12–28)
BUN: 18 mg/dL (ref 10–36)
Bilirubin Total: <0.2 mg/dL (ref 0.0–1.2)
CO2: 27 mmol/L (ref 20–29)
Calcium: 8.9 mg/dL (ref 8.7–10.3)
Chloride: 105 mmol/L (ref 96–106)
Creatinine, Ser: 0.63 mg/dL (ref 0.57–1.00)
Globulin, Total: 2.4 g/dL (ref 1.5–4.5)
Glucose: 101 mg/dL — ABNORMAL HIGH (ref 70–99)
Potassium: 4.3 mmol/L (ref 3.5–5.2)
Sodium: 143 mmol/L (ref 134–144)
Total Protein: 6.5 g/dL (ref 6.0–8.5)
eGFR: 83 mL/min/{1.73_m2} (ref >59)

## 2021-08-21 DIAGNOSIS — M79672 Pain in left foot: Secondary | ICD-10-CM | POA: Diagnosis not present

## 2021-08-21 DIAGNOSIS — I739 Peripheral vascular disease, unspecified: Secondary | ICD-10-CM | POA: Diagnosis not present

## 2021-08-21 DIAGNOSIS — L110 Acquired keratosis follicularis: Secondary | ICD-10-CM | POA: Diagnosis not present

## 2021-08-21 DIAGNOSIS — M79671 Pain in right foot: Secondary | ICD-10-CM | POA: Diagnosis not present

## 2021-08-26 ENCOUNTER — Other Ambulatory Visit: Payer: Self-pay | Admitting: Family Medicine

## 2021-08-26 ENCOUNTER — Ambulatory Visit (INDEPENDENT_AMBULATORY_CARE_PROVIDER_SITE_OTHER): Payer: Medicare Other | Admitting: Family Medicine

## 2021-08-26 ENCOUNTER — Encounter: Payer: Self-pay | Admitting: Family Medicine

## 2021-08-26 VITALS — BP 117/72 | HR 59 | Temp 97.3°F | Ht 60.0 in | Wt 132.6 lb

## 2021-08-26 DIAGNOSIS — Z7901 Long term (current) use of anticoagulants: Secondary | ICD-10-CM | POA: Diagnosis not present

## 2021-08-26 DIAGNOSIS — Z5181 Encounter for therapeutic drug level monitoring: Secondary | ICD-10-CM | POA: Diagnosis not present

## 2021-08-26 DIAGNOSIS — I4811 Longstanding persistent atrial fibrillation: Secondary | ICD-10-CM | POA: Diagnosis not present

## 2021-08-26 DIAGNOSIS — I63512 Cerebral infarction due to unspecified occlusion or stenosis of left middle cerebral artery: Secondary | ICD-10-CM

## 2021-08-26 DIAGNOSIS — L02422 Furuncle of left axilla: Secondary | ICD-10-CM | POA: Diagnosis not present

## 2021-08-26 LAB — POCT INR: INR: 2 (ref 2–3)

## 2021-08-26 LAB — COAGUCHEK XS/INR WAIVED
INR: 2 — ABNORMAL HIGH (ref 0.9–1.1)
Prothrombin Time: 24.4 s

## 2021-08-26 MED ORDER — CEPHALEXIN 250 MG PO CAPS: 250.0000 mg | ORAL_CAPSULE | Freq: Four times a day (QID) | ORAL | 0 refills | Status: AC

## 2021-08-26 NOTE — Patient Instructions (Signed)
Skin Abscess  A skin abscess is an infected area of your skin that contains pus and other material. An abscess can happen in any part of your body. Some abscesses break open (rupture) on their own. Most continue to get worse unless they are treated. The infection can spread deeper into the body and into your blood, which can make you feel sick. A skin abscess is caused by germs that enter the skin through a cut or scrape. It can also be caused by blocked oil and sweat glands or infected hair follicles. This condition is usually treated by: Draining the pus. Taking antibiotic medicines. Placing a warm, wet washcloth over the abscess. Follow these instructions at home: Medicines  Take over-the-counter and prescription medicines only as told by your doctor. If you were prescribed an antibiotic medicine, take it as told by your doctor. Do not stop taking the antibiotic even if you start to feel better. Abscess care  If you have an abscess that has not drained, place a warm, clean, wet washcloth over the abscess several times a day. Do this as told by your doctor. Follow instructions from your doctor about how to take care of your abscess. Make sure you: Cover the abscess with a bandage (dressing). Change your bandage or gauze as told by your doctor. Wash your hands with soap and water before you change the bandage or gauze. If you cannot use soap and water, use hand sanitizer. Check your abscess every day for signs that the infection is getting worse. Check for: More redness, swelling, or pain. More fluid or blood. Warmth. More pus or a bad smell. General instructions To avoid spreading the infection: Do not share personal care items, towels, or hot tubs with others. Avoid making skin-to-skin contact with other people. Keep all follow-up visits as told by your doctor. This is important. Contact a doctor if: You have more redness, swelling, or pain around your abscess. You have more fluid  or blood coming from your abscess. Your abscess feels warm when you touch it. You have more pus or a bad smell coming from your abscess. Your muscles ache. You feel sick. Get help right away if: You have very bad (severe) pain. You see red streaks on your skin spreading away from the abscess. You see redness that spreads quickly. You have a fever or chills. Summary A skin abscess is an infected area of your skin that contains pus and other material. The abscess is caused by germs that enter the skin through a cut or scrape. It can also be caused by blocked oil and sweat glands or infected hair follicles. Follow your doctor's instructions on caring for your abscess, taking medicines, preventing infections, and keeping follow-up visits. This information is not intended to replace advice given to you by your health care provider. Make sure you discuss any questions you have with your health care provider. Document Revised: 04/03/2021 Document Reviewed: 10/07/2020 Elsevier Patient Education  2023 Elsevier Inc.  

## 2021-08-26 NOTE — Progress Notes (Signed)
Subjective: CC: Atrial fibrillation PCP: [name], DO QIW:LNLG Noto is a 88 y.o. female presenting to clinic today for:  1.  Atrial fibrillation Goal INR 2-3 Patient was seen 2 weeks ago and her INR was subtherapeutic at that visit.  She is instructed to increase to 2 tablets that day then resume her normal dosing of 1.5 tablets daily except for 1 tablet on Saturdays.  She is here for interval follow-up.  She is accompanied today's visit by her daughter who notes that she has been doing well since our last visit.  No bleeding reported.  No heart palpitations reported.  2.  Skin lesion Patient has a spot under her left arm that looks like a pimple that she scratched off.  Does not report any significant drainage.  No fevers, chills.   ROS: Per HPI  Allergies  Allergen Reactions   Codeine Other (See Comments)   Influenza Vaccines    Sulfa Antibiotics Hives   Past Medical History:  Diagnosis Date   Acute cystitis with hematuria 11/22/2020   Atrial fibrillation ([place])    BCC (basal cell carcinoma of skin) 11/14/2004   Right Mid Cheek(Cx3,5FU)   BCC (basal cell carcinoma of skin) 04/16/2011   Right Crease(tx p bx)   BCC (basal cell carcinoma of skin) 06/16/2016   Left Nare(tx p bx)   BCC (basal cell carcinoma of skin) Atypical Basaloid 09/29/2018   Left Side Nose(tx p bx)   Hypertension    Osteoporosis    Pneumonia of lower lobe due to infectious organism 11/20/2020   SCC (squamous cell carcinoma) 09/29/2018   Left Jawline(tx p bx)   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   Right Jawline(Cx3,5FU) and Left Upper Cheek(Cx3,5FU)   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   Left Jawline(tx p bx), Nose(tx p bx), Right Cheek(tx p bx), and Right Forearm(tx p bx)   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   Right Temple(Cx3,5FU) and Left Outer Eye(Cx3,5FU)   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   Right Forearm(Cx3,5FU)   Squamous  cell carcinoma in situ (SCCIS) 03/13/2005   Left Side of Nose(Cx3,5FU)   Squamous cell carcinoma in situ (SCCIS) 06/04/2005   Inner Right Cheek/Eye(Tx p Bx)   Squamous cell carcinoma in situ (SCCIS) 02/12/2010   Right Cheek(tx p bx)   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 2 09/29/2018   Upper Lip(tx p bx) and Bridge of Nose(tx p bx)   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   Left Forehead, Left Neck, Right Nose, and Right Hand   Stroke ([place])    Tremor    Weakness 11/20/2020    Current Outpatient Medications:    amLODipine (NORVASC) 5 MG tablet, TAKE 1 AND 1/2 TABLETS BY MOUTH DAILY, Disp: 135 tablet, Rfl: 0   aspirin EC 81 MG tablet, Take 81 mg by mouth 3 (three) times a week., Disp: , Rfl:    atorvastatin (LIPITOR) 40 MG tablet, TAKE 1 TABLET BY MOUTH AT BEDTIME, Disp: 30 tablet, Rfl: 0   clobetasol cream (TEMOVATE) 0.05 %, APPLY TO THE AFFECTED AREA(S) TWICE DAILY UNTIL PSORIATIC LESION RESOLVED, Disp: 120 g, Rfl: 2   lisinopril (ZESTRIL) 30 MG tablet, Take 1 tablet (30 mg total) by mouth daily. NOTE CHANGE IN DOSE, Disp: 90 tablet, Rfl: 1   polyethylene glycol (MIRALAX / GLYCOLAX) 17 g packet, Take 17 g by mouth daily., Disp: , Rfl:    primidone (MYSOLINE)  50 MG tablet, TAKE 1 AND 1/2 TABLETS BY MOUTH THREE TIMES DAILY, Disp: 405 tablet, Rfl: 2   propranolol (INDERAL) 20 MG tablet, Take 0.5 tablets (10 mg total) by mouth 2 (two) times daily., Disp: 90 tablet, Rfl: 1   warfarin (COUMADIN) 5 MG tablet, Take 1.5 tablets (7.5 mg total) by mouth daily at 4 PM. On Monday, Tuesday, and Saturday take 1 tablet., Disp: 135 tablet, Rfl: 3 Social History   Socioeconomic History   Marital status: Widowed    Spouse name: Not on file   Number of children: 3   Years of education: Not on file   Highest education level: Not on file  Occupational History   Occupation: Retired  Tobacco Use   Smoking status: Never   Smokeless tobacco: Never  Vaping Use   Vaping Use: Never used   Substance and Sexual Activity   Alcohol use: Never   Drug use: Never   Sexual activity: Not Currently  Other Topics Concern   Not on file  Social History Narrative   Not on file   Social Determinants of Health   Financial Resource Strain: Not on file  Food Insecurity: Not on file  Transportation Needs: Not on file  Physical Activity: Not on file  Stress: Not on file  Social Connections: Not on file  Intimate Partner Violence: Not on file   Family History  Problem Relation Age of Onset   Dementia Mother    Stroke Father    Heart disease Father    Diabetes Sister    Stroke Son     Objective: Office vital signs reviewed. BP 117/72   Pulse (!) 59   Temp (!) 97.3 F (36.3 C)   Ht 5' (1.524 m)   Wt 132 lb 9.6 oz (60.1 kg)   SpO2 95%   BMI 25.90 kg/m   Physical Examination:  General: Awake, alert, nontoxic elderly female, No acute distress HEENT: Sclera white.  Resting tremor present Cardio: regular rate and rhythm, S1S2 heard, no murmurs appreciated Pulm: clear to auscultation bilaterally, no wheezes, rhonchi or rales; normal work of breathing on room air Skin: There appears to be a mildly indurated abscess that is approximately grape size underneath the left axilla.  There is a central punctum.  No fluctuance.  Moderate erythema appreciated  Assessment/ Plan: 88 y.o. female   Longstanding persistent atrial fibrillation ([place]) - Plan: CoaguChek XS/INR Waived, POCT INR  Anticoagulation goal of INR 2 to 3 - Plan: CoaguChek XS/INR Waived, POCT INR  Furuncle of left axilla - Plan: cephALEXin (KEFLEX) 250 MG capsule  A-fib is controlled and INR is therapeutic at 2.0 today.  Continue current regimen.  Lesion of concern is furuncle versus abscess.  Regardless we will empirically treat with antibiotics.  Keflex given 4 times daily.  Home care instructions reviewed including warm compresses and reasons for reevaluation.  She voiced good understanding will follow-up as  needed or in 6 to 8 weeks as scheduled for INR checkup  Orders Placed This Encounter  Procedures   CoaguChek XS/INR Waived   No orders of the defined types were placed in this encounter.    [name], DO [place] [phone]

## 2021-09-02 ENCOUNTER — Other Ambulatory Visit: Payer: Self-pay | Admitting: Family Medicine

## 2021-09-02 DIAGNOSIS — I10 Essential (primary) hypertension: Secondary | ICD-10-CM

## 2021-09-16 ENCOUNTER — Telehealth: Payer: Self-pay | Admitting: Cardiology

## 2021-09-16 NOTE — Telephone Encounter (Signed)
Sleepiness would not be related to coming off propanolol, actually being on propranolol can cause fatigue and often this improves with stopping medication. She was on a low dose so stopping at once was ok. The tremor would increase of propranolol, I would suggest she touch base with her pcp to see if anything else could be done for tremor. Her low heart rates really would prohibit restarting propanolol  Dominique Abts MD

## 2021-09-16 NOTE — Telephone Encounter (Signed)
Daughter states that since being off of the propanolol, patient is napping more and seems to shake a lot more. States that she does not know if this is because she went off the medication cold [place]. Wants to know if Dr. Harl Bowie thinks she needs to be back on this medication. Please advise

## 2021-09-16 NOTE — Telephone Encounter (Signed)
lmtcb

## 2021-09-16 NOTE — Telephone Encounter (Signed)
Daughter made aware, verbalized understanding. States that patient looked more alert today and that her hands were not shaking as bad but will discuss further with pcp at next visit.

## 2021-09-16 NOTE — Telephone Encounter (Signed)
Daughter was returning call

## 2021-09-16 NOTE — Telephone Encounter (Signed)
Pt c/o medication issue:  1. Name of Medication:   propranolol (INDERAL) 20 MG tablet  2. How are you currently taking this medication (dosage and times per day)?  As prescribed  3. Are you having a reaction (difficulty breathing--STAT)? No  4. What is your medication issue?   Daughter called stated patient has been having shaking in her hands.  Daughter stated she feels the symptoms the patient is having is related to the patient stopping taking this medication.  Daughter stated the patient has been sitting around and napping a lot.

## 2021-09-24 ENCOUNTER — Other Ambulatory Visit: Payer: Self-pay | Admitting: Family Medicine

## 2021-09-24 DIAGNOSIS — I63512 Cerebral infarction due to unspecified occlusion or stenosis of left middle cerebral artery: Secondary | ICD-10-CM

## 2021-10-14 ENCOUNTER — Ambulatory Visit (INDEPENDENT_AMBULATORY_CARE_PROVIDER_SITE_OTHER): Payer: Medicare Other | Admitting: Family Medicine

## 2021-10-14 ENCOUNTER — Encounter: Payer: Self-pay | Admitting: Family Medicine

## 2021-10-14 VITALS — BP 127/76 | Temp 98.2°F | Ht 60.0 in | Wt 138.0 lb

## 2021-10-14 DIAGNOSIS — I4811 Longstanding persistent atrial fibrillation: Secondary | ICD-10-CM | POA: Diagnosis not present

## 2021-10-14 DIAGNOSIS — Z5181 Encounter for therapeutic drug level monitoring: Secondary | ICD-10-CM

## 2021-10-14 DIAGNOSIS — D692 Other nonthrombocytopenic purpura: Secondary | ICD-10-CM | POA: Diagnosis not present

## 2021-10-14 DIAGNOSIS — Z7901 Long term (current) use of anticoagulants: Secondary | ICD-10-CM

## 2021-10-14 DIAGNOSIS — G250 Essential tremor: Secondary | ICD-10-CM

## 2021-10-14 LAB — POCT INR: INR: 2.1 (ref 2–3)

## 2021-10-14 NOTE — Progress Notes (Signed)
Subjective: CC:INR check PCP: [name], DO BDZ:HGDJ Kadel is a 88 y.o. female presenting to clinic today for:  1.  Atrial fibrillation, goal INR 2-3 Patient is compliant with 7.5 mg daily and 5 mg 1 day/week.  No bleeding reported.  She has easy bruising along bilateral upper extremities and lower extremities.  No falls reported.  No heart palpitations, chest pain or shortness of breath.  2.  Tremor Patient has discontinued the propranolol and they have noticed an uptick in her tremor.  She continues to take 75 mg of primidone 3 times daily.  Has not reached out to neurology to see if this could be upped any further but is asking that I reach out to him today   ROS: Per HPI  Allergies  Allergen Reactions   Codeine Other (See Comments)   Influenza Vaccines    Sulfa Antibiotics Hives   Past Medical History:  Diagnosis Date   Acute cystitis with hematuria 11/22/2020   Atrial fibrillation ([place])    BCC (basal cell carcinoma of skin) 11/14/2004   Right Mid Cheek(Cx3,5FU)   BCC (basal cell carcinoma of skin) 04/16/2011   Right Crease(tx p bx)   BCC (basal cell carcinoma of skin) 06/16/2016   Left Nare(tx p bx)   BCC (basal cell carcinoma of skin) Atypical Basaloid 09/29/2018   Left Side Nose(tx p bx)   Hypertension    Osteoporosis    Pneumonia of lower lobe due to infectious organism 11/20/2020   SCC (squamous cell carcinoma) 09/29/2018   Left Jawline(tx p bx)   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   Right Jawline(Cx3,5FU) and Left Upper Cheek(Cx3,5FU)   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   Left Jawline(tx p bx), Nose(tx p bx), Right Cheek(tx p bx), and Right Forearm(tx p bx)   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   Right Temple(Cx3,5FU) and Left Outer Eye(Cx3,5FU)   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   Right Forearm(Cx3,5FU)   Squamous cell carcinoma in situ (SCCIS) 03/13/2005   Left Side of Nose(Cx3,5FU)    Squamous cell carcinoma in situ (SCCIS) 06/04/2005   Inner Right Cheek/Eye(Tx p Bx)   Squamous cell carcinoma in situ (SCCIS) 02/12/2010   Right Cheek(tx p bx)   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 2 09/29/2018   Upper Lip(tx p bx) and Bridge of Nose(tx p bx)   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   Left Forehead, Left Neck, Right Nose, and Right Hand   Stroke (HCC)    Tremor    Weakness 11/20/2020    Current Outpatient Medications:    amLODipine (NORVASC) 5 MG tablet, TAKE 1 AND 1/2 TABLETS BY MOUTH DAILY, Disp: 135 tablet, Rfl: 1   aspirin EC 81 MG tablet, Take 81 mg by mouth 3 (three) times a week., Disp: , Rfl:    atorvastatin (LIPITOR) 40 MG tablet, TAKE 1 TABLET BY MOUTH AT BEDTIME, Disp: 30 tablet, Rfl: 2   clobetasol cream (TEMOVATE) 0.05 %, APPLY TO THE AFFECTED AREA(S) TWICE DAILY UNTIL PSORIATIC LESION RESOLVED, Disp: 120 g, Rfl: 2   lisinopril (ZESTRIL) 30 MG tablet, Take 1 tablet (30 mg total) by mouth daily. NOTE CHANGE IN DOSE, Disp: 90 tablet, Rfl: 1   polyethylene glycol (MIRALAX / GLYCOLAX) 17 g packet, Take 17 g by mouth daily., Disp: , Rfl:    primidone (MYSOLINE) 50 MG tablet, TAKE 1 AND 1/2 TABLETS BY MOUTH THREE TIMES DAILY,  Disp: 405 tablet, Rfl: 2   warfarin (COUMADIN) 5 MG tablet, Take 1.5 tablets (7.5 mg total) by mouth daily at 4 PM. On Monday, Tuesday, and Saturday take 1 tablet., Disp: 135 tablet, Rfl: 3 Social History   Socioeconomic History   Marital status: Widowed    Spouse name: Not on file   Number of children: 3   Years of education: Not on file   Highest education level: Not on file  Occupational History   Occupation: Retired  Tobacco Use   Smoking status: Never   Smokeless tobacco: Never  Vaping Use   Vaping Use: Never used  Substance and Sexual Activity   Alcohol use: Never   Drug use: Never   Sexual activity: Not Currently  Other Topics Concern   Not on file  Social History Narrative   Not on file   Social  Determinants of Health   Financial Resource Strain: Not on file  Food Insecurity: Not on file  Transportation Needs: Not on file  Physical Activity: Not on file  Stress: Not on file  Social Connections: Not on file  Intimate Partner Violence: Not on file   Family History  Problem Relation Age of Onset   Dementia Mother    Stroke Father    Heart disease Father    Diabetes Sister    Stroke Son     Objective: Office vital signs reviewed. BP 127/76   Temp 98.2 F (36.8 C)   Ht 5' (1.524 m)   Wt 138 lb (62.6 kg)   BMI 26.95 kg/m   Physical Examination:  General: Awake, alert, nontoxic elderly female, No acute distress HEENT: sclera white, MMM Cardio: irregularly irregular, rate controlled. S1S2 heard, no murmurs appreciated Pulm: clear to auscultation bilaterally, no wheezes, rhonchi or rales; normal work of breathing on room air Skin: senile purpura present  Assessment/ Plan: 88 y.o. female   Longstanding persistent atrial fibrillation ([place]) - Plan: POCT INR  Anticoagulation goal of INR 2 to 3 - Plan: CoaguChek XS/INR Waived, POCT INR  Purpura senilis (HCC) - Plan: POCT INR  Essential tremor - Plan: POCT INR  INR therapeutic at 2.1.  No changes.  8-week follow-up made.  I will reach out to her neurologist with regards to the primidone.  She has had some increasing tremor since discontinuation of the propranolol but her blood pressure and rate have nicely stabilized.  Orders Placed This Encounter  Procedures   CoaguChek XS/INR Waived   No orders of the defined types were placed in this encounter.    [name], DO [place] [phone]

## 2021-10-15 LAB — COAGUCHEK XS/INR WAIVED
INR: 2.1 — ABNORMAL HIGH (ref 0.9–1.1)
Prothrombin Time: 25 s

## 2021-10-15 NOTE — Telephone Encounter (Signed)
Lm making betty aware

## 2021-10-15 NOTE — Telephone Encounter (Signed)
-----   Message from [name], DO sent at 10/15/2021  1:23 PM EDT ----- Let patient know Dr Leonie Man said ok to increase Primidone to '100mg'$  3 times daily if she can tolerate it for her tremor.  Make sure she has a follow up scheduled with them.   ----- Message ----- From: Garvin Fila, MD Sent: 10/15/2021   8:26 AM EDT To: [name], DO  If patient is willing we can try increasing to 100 mg 3 times daily if no side effects ----- Message ----- From: [name], DO Sent: 10/14/2021   5:10 PM EDT To: Garvin Fila, MD  Any more room for increase in that primidone?  Since propranolol off, her tremor has increased.  BP and HR look good on the upside.

## 2021-10-29 ENCOUNTER — Ambulatory Visit: Payer: Medicare Other | Admitting: Physician Assistant

## 2021-10-30 DIAGNOSIS — M79672 Pain in left foot: Secondary | ICD-10-CM | POA: Diagnosis not present

## 2021-10-30 DIAGNOSIS — L110 Acquired keratosis follicularis: Secondary | ICD-10-CM | POA: Diagnosis not present

## 2021-10-30 DIAGNOSIS — I739 Peripheral vascular disease, unspecified: Secondary | ICD-10-CM | POA: Diagnosis not present

## 2021-10-30 DIAGNOSIS — M79671 Pain in right foot: Secondary | ICD-10-CM | POA: Diagnosis not present

## 2021-11-17 ENCOUNTER — Telehealth: Payer: Self-pay | Admitting: Family Medicine

## 2021-11-17 NOTE — Telephone Encounter (Signed)
Maybe the manufacturer is dc'ing this particular dose?  We can send in the '20mg'$  but she'd have to do 1.5 tablets daily.

## 2021-11-17 NOTE — Telephone Encounter (Signed)
I called daughter and she states [facility] told her that this medication has been d/c by provider that she does not need this medication anymore- I let daughter know this is not the case and I will call pharmacy.  I called [facility] and they state they have fixed it in their system as lisinpril '30mg'$  once a day and amlodipine '5mg'$  1 and a half a day   Daughter betty is aware

## 2021-12-01 ENCOUNTER — Other Ambulatory Visit: Payer: Self-pay | Admitting: Family Medicine

## 2021-12-01 DIAGNOSIS — I10 Essential (primary) hypertension: Secondary | ICD-10-CM

## 2021-12-09 ENCOUNTER — Ambulatory Visit: Payer: Medicare Other | Admitting: Nurse Practitioner

## 2021-12-09 ENCOUNTER — Encounter: Payer: Self-pay | Admitting: Family Medicine

## 2021-12-09 ENCOUNTER — Ambulatory Visit (INDEPENDENT_AMBULATORY_CARE_PROVIDER_SITE_OTHER): Payer: Medicare Other | Admitting: Family Medicine

## 2021-12-09 VITALS — BP 135/82 | HR 70 | Temp 97.7°F | Ht 60.0 in | Wt 133.4 lb

## 2021-12-09 DIAGNOSIS — S51811A Laceration without foreign body of right forearm, initial encounter: Secondary | ICD-10-CM | POA: Diagnosis not present

## 2021-12-09 DIAGNOSIS — Z7901 Long term (current) use of anticoagulants: Secondary | ICD-10-CM

## 2021-12-09 DIAGNOSIS — R630 Anorexia: Secondary | ICD-10-CM

## 2021-12-09 DIAGNOSIS — F4321 Adjustment disorder with depressed mood: Secondary | ICD-10-CM

## 2021-12-09 DIAGNOSIS — Z5181 Encounter for therapeutic drug level monitoring: Secondary | ICD-10-CM | POA: Diagnosis not present

## 2021-12-09 DIAGNOSIS — R791 Abnormal coagulation profile: Secondary | ICD-10-CM

## 2021-12-09 DIAGNOSIS — F99 Mental disorder, not otherwise specified: Secondary | ICD-10-CM

## 2021-12-09 DIAGNOSIS — I4811 Longstanding persistent atrial fibrillation: Secondary | ICD-10-CM | POA: Diagnosis not present

## 2021-12-09 LAB — COAGUCHEK XS/INR WAIVED
INR: 3.8 — ABNORMAL HIGH (ref 0.9–1.1)
Prothrombin Time: 45.7 s

## 2021-12-09 LAB — POCT INR: INR: 3.8 — AB (ref 2–3)

## 2021-12-09 MED ORDER — MUPIROCIN CALCIUM 2 % EX CREA: 1.0000 | TOPICAL_CREAM | Freq: Two times a day (BID) | CUTANEOUS | 0 refills | Status: AC

## 2021-12-09 MED ORDER — MIRTAZAPINE 7.5 MG PO TABS: 7.5000 mg | ORAL_TABLET | Freq: Every day | ORAL | 0 refills | Status: DC

## 2021-12-09 NOTE — Patient Instructions (Signed)
Skin Tear A skin tear is a wound in which the top layers of skin peel off. This is a common problem for older people. It can also be a problem for people who take certain medicines for too long. To repair the skin, your doctor may use: Tape. Skin tape (adhesive) strips. A bandage (dressing) may also be placed over the tape or skin tape strips. Follow these instructions at home: Keep your wound clean Clean the wound as told by your doctor. You may be told to keep the wound dry for the first few days. If you are told to clean the wound: Wash the wound with mild soap and water, a wound cleanser, or a salt-water (saline) solution. If you use soap, rinse the wound with water to get all the soap off. Do not rub the wound dry. Pat the wound gently with a clean towel or let it air-dry. Change any bandage as told by your doctor. This includes changing the bandage if it gets wet, gets dirty, or starts to smell bad. To change your bandage: Wash your hands with soap and water for at least 20 seconds before and after you change your bandage. If you cannot use soap and water, use hand sanitizer. Leave tape or skin tape strips alone unless you are told to take them off. You may trim the edges of the tape strips if they curl up. Watch for signs of infection Check your wound every day for signs of infection. Check for: Redness, swelling, or pain. More fluid or blood. Warmth. Pus or a bad smell.  Protect your wound Do not scratch or pick at the wound. Protect the injured area until it has healed. Medicines Take or apply over-the-counter and prescription medicines only as told by your doctor. If you were prescribed an antibiotic medicine, take or apply it as told by your doctor. Do not stop using it even if your condition gets better. General instructions  Keep the bandage dry. Do not take baths, swim, use a hot tub, or do anything that puts your wound underwater. Ask your doctor about taking showers or  sponge baths. Keep all follow-up visits. Contact a doctor if: You have any of these signs of infection in your wound: Redness, swelling, or pain. More fluid or blood. Warmth. Pus or a bad smell. Get help right away if: You have a red streak that goes away from the skin tear. You have a fever and chills, and your symptoms get worse all of a sudden. Summary A skin tear is a wound in which the top layers of skin peel off. To repair the skin, your doctor may use tape or skin tape strips. Change any bandage as told by your doctor. Take or apply over-the-counter and prescription medicines only as told by your doctor. Contact a doctor if you have signs of infection. This information is not intended to replace advice given to you by your health care provider. Make sure you discuss any questions you have with your health care provider. Document Revised: 04/05/2019 Document Reviewed: 04/05/2019 Elsevier Patient Education  2023 Elsevier Inc.  

## 2021-12-09 NOTE — Progress Notes (Signed)
Subjective: CC: Skin tear PCP: [name], DO YPP:JKDT Dominique Farley is a 88 y.o. female presenting to clinic today for:  1.  Skin tear Patient is brought to the office by her daughter.  She notes that she noticed a skin tear on her forearm yesterday.  Uncertain as to the method of injury but apparently it bled pretty profusely in the bathroom.  She has been applying topical antibiotic and utilizing some Xeroform bandages that they have left over.  No fevers.  The bleeding seems to have stopped.  2.  Grief reaction ?  Memory issues Her daughter voices concern over her mother's lack of eating.  She frequently prepares meals for her mother but sometimes they go totally untouched.  She is not sure if this is manifestations of grief due to her son passing away a few months ago or if she has some type of memory problem.  She feels like she "has just given up".  The patient denies feeling like she does not have a good appetite but she does look somewhat tearful when her son is brought up.  She does not have a caregiver come in regularly and her daughters are both very involved in her care as well.  3.  A-fib INR goal 2-3 Takes 1.5 tablets daily except for 1 tablet on Saturdays.  Bleeding from the skin tear as above but otherwise no concerns reported.  Never did advance her medication for the tremor because there was no one to monitor her closely in the evening time and they are afraid that she would have an adverse reaction.  ROS: Per HPI  Allergies  Allergen Reactions   Codeine Other (See Comments)   Influenza Vaccines    Sulfa Antibiotics Hives   Past Medical History:  Diagnosis Date   Acute cystitis with hematuria 11/22/2020   Atrial fibrillation ([place])    BCC (basal cell carcinoma of skin) 11/14/2004   Right Mid Cheek(Cx3,5FU)   BCC (basal cell carcinoma of skin) 04/16/2011   Right Crease(tx p bx)   BCC (basal cell carcinoma of skin) 06/16/2016   Left Nare(tx p bx)   BCC (basal  cell carcinoma of skin) Atypical Basaloid 09/29/2018   Left Side Nose(tx p bx)   Hypertension    Osteoporosis    Pneumonia of lower lobe due to infectious organism 11/20/2020   SCC (squamous cell carcinoma) 09/29/2018   Left Jawline(tx p bx)   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   Right Jawline(Cx3,5FU) and Left Upper Cheek(Cx3,5FU)   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   Left Jawline(tx p bx), Nose(tx p bx), Right Cheek(tx p bx), and Right Forearm(tx p bx)   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   Right Temple(Cx3,5FU) and Left Outer Eye(Cx3,5FU)   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   Right Forearm(Cx3,5FU)   Squamous cell carcinoma in situ (SCCIS) 03/13/2005   Left Side of Nose(Cx3,5FU)   Squamous cell carcinoma in situ (SCCIS) 06/04/2005   Inner Right Cheek/Eye(Tx p Bx)   Squamous cell carcinoma in situ (SCCIS) 02/12/2010   Right Cheek(tx p bx)   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 2 09/29/2018   Upper Lip(tx p bx) and Bridge of Nose(tx p bx)   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   Left Forehead, Left Neck, Right Nose, and Right Hand   Stroke ([place])    Tremor    Weakness 11/20/2020    Current Outpatient  Medications:    amLODipine (NORVASC) 5 MG tablet, TAKE 1 AND 1/2 TABLETS BY MOUTH DAILY, Disp: 135 tablet, Rfl: 1   aspirin EC 81 MG tablet, Take 81 mg by mouth 3 (three) times a week., Disp: , Rfl:    atorvastatin (LIPITOR) 40 MG tablet, TAKE 1 TABLET BY MOUTH AT BEDTIME, Disp: 30 tablet, Rfl: 2   clobetasol cream (TEMOVATE) 0.05 %, APPLY TO THE AFFECTED AREA(S) TWICE DAILY UNTIL PSORIATIC LESION RESOLVED, Disp: 120 g, Rfl: 2   lisinopril (ZESTRIL) 30 MG tablet, TAKE 1 TABLET BY MOUTH EVERY DAY (note DOSE CHANGE), Disp: 90 tablet, Rfl: 0   mirtazapine (REMERON) 7.5 MG tablet, Take 1 tablet (7.5 mg total) by mouth at bedtime., Disp: 90 tablet, Rfl: 0   mupirocin cream (BACTROBAN) 2 %, Apply 1 Application  topically 2 (two) times daily for 7 days., Disp: 15 g, Rfl: 0   polyethylene glycol (MIRALAX / GLYCOLAX) 17 g packet, Take 17 g by mouth daily., Disp: , Rfl:    primidone (MYSOLINE) 50 MG tablet, TAKE 1 AND 1/2 TABLETS BY MOUTH THREE TIMES DAILY, Disp: 405 tablet, Rfl: 2   warfarin (COUMADIN) 5 MG tablet, Take 1.5 tablets (7.5 mg total) by mouth daily at 4 PM. On Monday, Tuesday, and Saturday take 1 tablet., Disp: 135 tablet, Rfl: 3 Social History   Socioeconomic History   Marital status: Widowed    Spouse name: Not on file   Number of children: 3   Years of education: Not on file   Highest education level: Not on file  Occupational History   Occupation: Retired  Tobacco Use   Smoking status: Never   Smokeless tobacco: Never  Vaping Use   Vaping Use: Never used  Substance and Sexual Activity   Alcohol use: Never   Drug use: Never   Sexual activity: Not Currently  Other Topics Concern   Not on file  Social History Narrative   Not on file   Social Determinants of Health   Financial Resource Strain: Not on file  Food Insecurity: Not on file  Transportation Needs: Not on file  Physical Activity: Not on file  Stress: Not on file  Social Connections: Not on file  Intimate Partner Violence: Not on file   Family History  Problem Relation Age of Onset   Dementia Mother    Stroke Father    Heart disease Father    Diabetes Sister    Stroke Son     Objective: Office vital signs reviewed. BP 135/82   Pulse 70   Temp 97.7 F (36.5 C)   Ht 5' (1.524 m)   Wt 133 lb 6.4 oz (60.5 kg)   SpO2 97%   BMI 26.05 kg/m   Physical Examination:  General: Awake, alert, No acute distress HEENT: sclera white, MMM Cardio: regular rate and rhythm, S1S2 heard, no murmurs appreciated Skin: 2 and half inch by 3 inch area of braised skin on the dorsal aspect of the right forearm.  There is some slight oozing.  No warmth, erythema or purulence. Psych: Appears depressed Neuro: She simply  has a masked facies.  She has tremor.  See MMSE below     12/09/2021    9:38 AM  MMSE - Mini Mental State Exam  Orientation to time 2  Orientation to Place 5  Registration 2  Attention/ Calculation 0  Recall 0  Language- name 2 objects 2  Language- repeat 0  Language- follow 3 step command 3  Language- read &  follow direction 0  Write a sentence 0  Copy design 1  Total score 15    Assessment/ Plan: 88 y.o. female   Supratherapeutic INR - Plan: POCT INR  Anticoagulation goal of INR 2 to 3 - Plan: CoaguChek XS/INR Waived, POCT INR  Longstanding persistent atrial fibrillation (HCC) - Plan: CoaguChek XS/INR Waived, POCT INR  Skin tear of right forearm without complication, initial encounter - Plan: mupirocin cream (BACTROBAN) 2 %, POCT INR  Grief reaction - Plan: mirtazapine (REMERON) 7.5 MG tablet  Decreased appetite - Plan: mirtazapine (REMERON) 7.5 MG tablet  Abnormal MMSE  INR supratherapeutic at 3.8.  She will skip today's 1.5 tablets and resume 1.5 tablets only on Wednesdays, Fridays, Sunday and Monday.  She will have only 1 tablet Thursday and Saturday.  A calendar was provided and we will reassess again on Monday.  May consider dropping an additional half tablet pending that result.  Of note I suspect that her Coumadin level is abnormal because her manufacture of Coumadin changed.  I prescribed Bactroban for application to that right arm.  Continue Xeroform bandages.  This time no evidence of infection but I worry about her developing infection given frailty.  I think she has a couple of things going on.  I certainly believe that she likely has some level of impaired cognition that is likely exacerbated by recent grief.  I am going to start her on mirtazapine and then reassess her MMSE in 4 to 6 weeks.  If it still low, I will be glad to reach out to Dr. Leonie Man, to see if we can get a more in-depth assessment.  Orders Placed This Encounter  Procedures   CoaguChek  XS/INR Waived   POCT INR    This order was created through the anticoagulation tracking navigator section.   Meds ordered this encounter  Medications   mupirocin cream (BACTROBAN) 2 %    Sig: Apply 1 Application topically 2 (two) times daily for 7 days.    Dispense:  15 g    Refill:  0   mirtazapine (REMERON) 7.5 MG tablet    Sig: Take 1 tablet (7.5 mg total) by mouth at bedtime.    Dispense:  90 tablet    Refill:  [place], DO [place] [phone]

## 2021-12-15 ENCOUNTER — Ambulatory Visit (INDEPENDENT_AMBULATORY_CARE_PROVIDER_SITE_OTHER): Payer: Medicare Other | Admitting: Family Medicine

## 2021-12-15 ENCOUNTER — Encounter: Payer: Self-pay | Admitting: Family Medicine

## 2021-12-15 VITALS — BP 137/75 | HR 81 | Temp 97.6°F | Ht 60.0 in | Wt 136.6 lb

## 2021-12-15 DIAGNOSIS — R791 Abnormal coagulation profile: Secondary | ICD-10-CM | POA: Diagnosis not present

## 2021-12-15 DIAGNOSIS — Z5181 Encounter for therapeutic drug level monitoring: Secondary | ICD-10-CM | POA: Diagnosis not present

## 2021-12-15 DIAGNOSIS — I4811 Longstanding persistent atrial fibrillation: Secondary | ICD-10-CM | POA: Diagnosis not present

## 2021-12-15 DIAGNOSIS — Z7901 Long term (current) use of anticoagulants: Secondary | ICD-10-CM

## 2021-12-15 DIAGNOSIS — H01005 Unspecified blepharitis left lower eyelid: Secondary | ICD-10-CM

## 2021-12-15 DIAGNOSIS — S51811D Laceration without foreign body of right forearm, subsequent encounter: Secondary | ICD-10-CM | POA: Diagnosis not present

## 2021-12-15 DIAGNOSIS — I6930 Unspecified sequelae of cerebral infarction: Secondary | ICD-10-CM | POA: Diagnosis not present

## 2021-12-15 DIAGNOSIS — I63512 Cerebral infarction due to unspecified occlusion or stenosis of left middle cerebral artery: Secondary | ICD-10-CM

## 2021-12-15 LAB — POCT INR: INR: 1.8 — AB (ref 2–3)

## 2021-12-15 MED ORDER — ERYTHROMYCIN 5 MG/GM OP OINT: 1.0000 | TOPICAL_OINTMENT | Freq: Two times a day (BID) | OPHTHALMIC | 0 refills | Status: AC

## 2021-12-15 NOTE — Progress Notes (Signed)
Subjective: CC:INR recheck PCP: Dominique Norlander, DO NWG:NFAO Dominique Farley is a 88 y.o. female presenting to clinic today for:  1. INR check Here for interval INR check.  She was noted to be supratherapeutic at her last visit with INR at 3.8.  She was instructed to skip that days 1/2 tablet dose and resume 1.5 tablets only on Wednesdays, Fridays, Sundays and Mondays with 1 tablet Thursdays and Saturdays.  Calendar was provided.  She is brought today by her other daughter.  Additionally, her mother seems to be sleeping better with the mirtazapine.  Too soon to tell if this can help mood and or appetite.   ROS: Per HPI  Allergies  Allergen Reactions   Codeine Other (See Comments)   Influenza Vaccines    Sulfa Antibiotics Hives   Past Medical History:  Diagnosis Date   Acute cystitis with hematuria 11/22/2020   Atrial fibrillation (HCC)    BCC (basal cell carcinoma of skin) 11/14/2004   Right Mid Cheek(Cx3,5FU)   BCC (basal cell carcinoma of skin) 04/16/2011   Right Crease(tx p bx)   BCC (basal cell carcinoma of skin) 06/16/2016   Left Nare(tx p bx)   BCC (basal cell carcinoma of skin) Atypical Basaloid 09/29/2018   Left Side Nose(tx p bx)   Hypertension    Osteoporosis    Pneumonia of lower lobe due to infectious organism 11/20/2020   SCC (squamous cell carcinoma) 09/29/2018   Left Jawline(tx p bx)   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   Right Jawline(Cx3,5FU) and Left Upper Cheek(Cx3,5FU)   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   Left Jawline(tx p bx), Nose(tx p bx), Right Cheek(tx p bx), and Right Forearm(tx p bx)   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   Right Temple(Cx3,5FU) and Left Outer Eye(Cx3,5FU)   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   Right Forearm(Cx3,5FU)   Squamous cell carcinoma in situ (SCCIS) 03/13/2005   Left Side of Nose(Cx3,5FU)   Squamous cell carcinoma in situ (SCCIS) 06/04/2005   Inner Right  Cheek/Eye(Tx p Bx)   Squamous cell carcinoma in situ (SCCIS) 02/12/2010   Right Cheek(tx p bx)   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 2 09/29/2018   Upper Lip(tx p bx) and Bridge of Nose(tx p bx)   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   Left Forehead, Left Neck, Right Nose, and Right Hand   Stroke ([place])    Tremor    Weakness 11/20/2020    Current Outpatient Medications:    amLODipine (NORVASC) 5 MG tablet, TAKE 1 AND 1/2 TABLETS BY MOUTH DAILY, Disp: 135 tablet, Rfl: 1   aspirin EC 81 MG tablet, Take 81 mg by mouth 3 (three) times a week., Disp: , Rfl:    atorvastatin (LIPITOR) 40 MG tablet, TAKE 1 TABLET BY MOUTH AT BEDTIME, Disp: 30 tablet, Rfl: 2   clobetasol cream (TEMOVATE) 0.05 %, APPLY TO THE AFFECTED AREA(S) TWICE DAILY UNTIL PSORIATIC LESION RESOLVED, Disp: 120 g, Rfl: 2   lisinopril (ZESTRIL) 30 MG tablet, TAKE 1 TABLET BY MOUTH EVERY DAY (note DOSE CHANGE), Disp: 90 tablet, Rfl: 0   mirtazapine (REMERON) 7.5 MG tablet, Take 1 tablet (7.5 mg total) by mouth at bedtime., Disp: 90 tablet, Rfl: 0   mupirocin cream (BACTROBAN) 2 %, Apply 1 Application topically 2 (two) times daily for 7 days., Disp: 15 g, Rfl: 0   polyethylene glycol (MIRALAX / GLYCOLAX) 17 g packet, Take  17 g by mouth daily., Disp: , Rfl:    primidone (MYSOLINE) 50 MG tablet, TAKE 1 AND 1/2 TABLETS BY MOUTH THREE TIMES DAILY, Disp: 405 tablet, Rfl: 2   warfarin (COUMADIN) 5 MG tablet, Take 1.5 tablets (7.5 mg total) by mouth daily at 4 PM. On Monday, Tuesday, and Saturday take 1 tablet., Disp: 135 tablet, Rfl: 3 Social History   Socioeconomic History   Marital status: Widowed    Spouse name: Not on file   Number of children: 3   Years of education: Not on file   Highest education level: Not on file  Occupational History   Occupation: Retired  Tobacco Use   Smoking status: Never   Smokeless tobacco: Never  Vaping Use   Vaping Use: Never used  Substance and Sexual Activity   Alcohol  use: Never   Drug use: Never   Sexual activity: Not Currently  Other Topics Concern   Not on file  Social History Narrative   Not on file   Social Determinants of Health   Financial Resource Strain: Not on file  Food Insecurity: Not on file  Transportation Needs: Not on file  Physical Activity: Not on file  Stress: Not on file  Social Connections: Not on file  Intimate Partner Violence: Not on file   Family History  Problem Relation Age of Onset   Dementia Mother    Stroke Father    Heart disease Father    Diabetes Sister    Stroke Son     Objective: Office vital signs reviewed. BP 137/75   Pulse 81   Temp 97.6 F (36.4 C)   Ht 5' (1.524 m)   Wt 136 lb 9.6 oz (62 kg)   SpO2 96%   BMI 26.68 kg/m   Physical Examination:  General: Awake, alert, nontoxic elderly female, No acute distress HEENT: Left lower eyelid with inflammation and slight erythema.  Her eye is watering Cardio: regular rate and rhythm, S1S2 heard, no murmurs appreciated Pulm: clear to auscultation bilaterally, no wheezes, rhonchi or rales; normal work of breathing on room air Skin: Right forearm with healing and much shallower skin tear noted.  No appreciable oozing of the wound including exudates or bleeding.  No erythema or warmth.  Assessment/ Plan: 88 y.o. female   Anticoagulation goal of INR 2 to 3 - Plan: CoaguChek XS/INR Waived, POCT INR  Longstanding persistent atrial fibrillation (HCC) - Plan: POCT INR  Cerebrovascular accident (CVA) due to occlusion of left middle cerebral artery ([place]) - Plan: POCT INR  Blepharitis of left lower eyelid, unspecified type - Plan: erythromycin ophthalmic ointment  Skin tear of right forearm without complication, subsequent encounter  Subtherapeutic now 1.8.  She will increase to 2 tablets today, then resume 1.5 tablets daily except for 2 days/week 1 tablet.  1 week follow-up scheduled.  If INR therapeutic at next visit, may return in 6 to 8 weeks with  me  Blepharitis present on left eye leg.  Erythromycin topically twice daily for 1 week if needed.  Skin tear is healing.  No evidence of secondary infection.  This was redressed during today's visit.  Continue skin care/wound care as directed  Orders Placed This Encounter  Procedures   CoaguChek XS/INR Waived   No orders of the defined types were placed in this encounter.    Dominique Norlander, DO [place] [phone]

## 2021-12-16 LAB — COAGUCHEK XS/INR WAIVED
INR: 1.8 — ABNORMAL HIGH (ref 0.9–1.1)
Prothrombin Time: 21.1 s

## 2021-12-23 ENCOUNTER — Encounter: Payer: Self-pay | Admitting: Family Medicine

## 2021-12-23 ENCOUNTER — Ambulatory Visit (INDEPENDENT_AMBULATORY_CARE_PROVIDER_SITE_OTHER): Payer: Medicare Other | Admitting: Family Medicine

## 2021-12-23 VITALS — BP 122/80 | HR 84 | Temp 97.5°F | Ht 61.0 in | Wt 134.4 lb

## 2021-12-23 DIAGNOSIS — I4811 Longstanding persistent atrial fibrillation: Secondary | ICD-10-CM

## 2021-12-23 DIAGNOSIS — Z7901 Long term (current) use of anticoagulants: Secondary | ICD-10-CM | POA: Diagnosis not present

## 2021-12-23 DIAGNOSIS — Z5181 Encounter for therapeutic drug level monitoring: Secondary | ICD-10-CM | POA: Diagnosis not present

## 2021-12-23 DIAGNOSIS — I6930 Unspecified sequelae of cerebral infarction: Secondary | ICD-10-CM | POA: Diagnosis not present

## 2021-12-23 DIAGNOSIS — I63512 Cerebral infarction due to unspecified occlusion or stenosis of left middle cerebral artery: Secondary | ICD-10-CM

## 2021-12-23 LAB — POCT INR: INR: 2.1 (ref 2.0–3.0)

## 2021-12-23 LAB — COAGUCHEK XS/INR WAIVED
INR: 2.1 — ABNORMAL HIGH (ref 0.9–1.1)
Prothrombin Time: 25 s

## 2021-12-23 NOTE — Progress Notes (Signed)
Subjective:  Patient ID: Dominique Farley, female    DOB: 1926-07-21, 88 y.o.   MRN: [id number]  Patient Care Team: [name], DO as PCP - General (Family Medicine) Harl Bowie Alphonse Guild, MD as PCP - Cardiology (Cardiology) Warren Danes, PA-C as Physician Assistant (Dermatology) Lavonna Monarch, MD (Inactive) as Consulting Physician (Dermatology)   Chief Complaint:  Coagulation Disorder   HPI: Dominique Farley is a 88 y.o. female presenting on 12/23/2021 for Coagulation Disorder   Last INR subtherapeutic at 1.8.  She is currently on 1.5 tablets daily except for 2 days/week 1 tablet. She denies any abnormal bleeding or bruising, no changes in diet. No shortness of breath, chest pain, palpitations, increased weakness or confusion.     Relevant past medical, surgical, family, and social history reviewed and updated as indicated.  Allergies and medications reviewed and updated. Data reviewed: Chart in Epic.   Past Medical History:  Diagnosis Date   Acute cystitis with hematuria 11/22/2020   Atrial fibrillation (HCC)    BCC (basal cell carcinoma of skin) 11/14/2004   Right Mid Cheek(Cx3,5FU)   BCC (basal cell carcinoma of skin) 04/16/2011   Right Crease(tx p bx)   BCC (basal cell carcinoma of skin) 06/16/2016   Left Nare(tx p bx)   BCC (basal cell carcinoma of skin) Atypical Basaloid 09/29/2018   Left Side Nose(tx p bx)   Hypertension    Osteoporosis    Pneumonia of lower lobe due to infectious organism 11/20/2020   SCC (squamous cell carcinoma) 09/29/2018   Left Jawline(tx p bx)   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   Right Jawline(Cx3,5FU) and Left Upper Cheek(Cx3,5FU)   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   Left Jawline(tx p bx), Nose(tx p bx), Right Cheek(tx p bx), and Right Forearm(tx p bx)   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   Right Temple(Cx3,5FU) and Left Outer Eye(Cx3,5FU)   Squamous cell carcinoma in situ (SCCIS)  11/14/2004   Right Forearm(Cx3,5FU)   Squamous cell carcinoma in situ (SCCIS) 03/13/2005   Left Side of Nose(Cx3,5FU)   Squamous cell carcinoma in situ (SCCIS) 06/04/2005   Inner Right Cheek/Eye(Tx p Bx)   Squamous cell carcinoma in situ (SCCIS) 02/12/2010   Right Cheek(tx p bx)   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 2 09/29/2018   Upper Lip(tx p bx) and Bridge of Nose(tx p bx)   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   Left Forehead, Left Neck, Right Nose, and Right Hand   Stroke ([place])    Tremor    Weakness 11/20/2020    Past Surgical History:  Procedure Laterality Date   CHOLECYSTECTOMY     skin biopsies      Social History   Socioeconomic History   Marital status: Widowed    Spouse name: Not on file   Number of children: 3   Years of education: Not on file   Highest education level: Not on file  Occupational History   Occupation: Retired  Tobacco Use   Smoking status: Never   Smokeless tobacco: Never  Vaping Use   Vaping Use: Never used  Substance and Sexual Activity   Alcohol use: Never   Drug use: Never   Sexual activity: Not Currently  Other Topics Concern   Not on file  Social History Narrative   Not on file   Social Determinants of Health   Financial Resource Strain: Not on file  Food Insecurity:  Not on file  Transportation Needs: Not on file  Physical Activity: Not on file  Stress: Not on file  Social Connections: Not on file  Intimate Partner Violence: Not on file    Outpatient Encounter Medications as of 12/23/2021  Medication Sig   amLODipine (NORVASC) 5 MG tablet TAKE 1 AND 1/2 TABLETS BY MOUTH DAILY   aspirin EC 81 MG tablet Take 81 mg by mouth 3 (three) times a week.   atorvastatin (LIPITOR) 40 MG tablet TAKE 1 TABLET BY MOUTH AT BEDTIME   clobetasol cream (TEMOVATE) 0.05 % APPLY TO THE AFFECTED AREA(S) TWICE DAILY UNTIL PSORIATIC LESION RESOLVED   lisinopril (ZESTRIL) 30 MG tablet TAKE 1 TABLET BY MOUTH EVERY DAY  (note DOSE CHANGE)   polyethylene glycol (MIRALAX / GLYCOLAX) 17 g packet Take 17 g by mouth daily.   primidone (MYSOLINE) 50 MG tablet TAKE 1 AND 1/2 TABLETS BY MOUTH THREE TIMES DAILY   warfarin (COUMADIN) 5 MG tablet Take 1.5 tablets (7.5 mg total) by mouth daily at 4 PM. On Monday, Tuesday, and Saturday take 1 tablet.   mirtazapine (REMERON) 7.5 MG tablet Take 1 tablet (7.5 mg total) by mouth at bedtime. (Patient not taking: Reported on 12/23/2021)   No facility-administered encounter medications on file as of 12/23/2021.    Allergies  Allergen Reactions   Codeine Other (See Comments)   Influenza Vaccines    Remeron [Mirtazapine] Other (See Comments)    Dizziness    Sulfa Antibiotics Hives    Review of Systems  Constitutional:  Negative for activity change, appetite change, chills, diaphoresis, fatigue, fever and unexpected weight change.  Respiratory:  Negative for cough and shortness of breath.   Cardiovascular:  Negative for palpitations and leg swelling.  Gastrointestinal:  Negative for anal bleeding.  Genitourinary:  Negative for difficulty urinating, hematuria and vaginal bleeding.  Neurological:  Negative for dizziness, tremors, seizures, syncope, facial asymmetry, speech difficulty, weakness, light-headedness, numbness and headaches.  Hematological:  Does not bruise/bleed easily.  All other systems reviewed and are negative.       Objective:  BP 122/80   Pulse 84   Temp (!) 97.5 F (36.4 C) (Temporal)   Ht '5\' 1"'$  (1.549 m)   Wt 134 lb 6.4 oz (61 kg)   SpO2 97%   BMI 25.39 kg/m    Wt Readings from Last 3 Encounters:  12/23/21 134 lb 6.4 oz (61 kg)  12/15/21 136 lb 9.6 oz (62 kg)  12/09/21 133 lb 6.4 oz (60.5 kg)    Physical Exam Vitals and nursing note reviewed.  Constitutional:      General: She is not in acute distress.    Appearance: She is not toxic-appearing or diaphoretic.     Comments: Frail elderly  HENT:     Head: Normocephalic and atraumatic.      Mouth/Throat:     Mouth: Mucous membranes are moist.  Eyes:     Pupils: Pupils are equal, round, and reactive to light.  Cardiovascular:     Rate and Rhythm: Normal rate. Rhythm irregularly irregular.     Heart sounds: No murmur heard. Musculoskeletal:     Right lower leg: No edema.     Left lower leg: No edema.  Skin:    General: Skin is warm and dry.     Capillary Refill: Capillary refill takes less than 2 seconds.     Findings: Bruising (scattered bruising to bilateral upper arms) present.  Neurological:     General: No focal  deficit present.     Mental Status: She is alert. Mental status is at baseline.     Gait: Gait abnormal (slow, using cane).  Psychiatric:        Mood and Affect: Mood normal.        Behavior: Behavior normal.        Thought Content: Thought content normal.        Judgment: Judgment normal.     Results for orders placed or performed in visit on 12/23/21  POCT INR  Result Value Ref Range   INR 2.1 2.0 - 3.0       Pertinent labs & imaging results that were available during my care of the patient were reviewed by me and considered in my medical decision making.  Assessment & Plan:  Joanny was seen today for coagulation disorder.  Diagnoses and all orders for this visit:  Anticoagulation goal of INR 2 to 3 Longstanding persistent atrial fibrillation ([place]) Cerebrovascular accident (CVA) due to occlusion of left middle cerebral artery (HCC) INR 2.1 today, at [facility]. No changes to current regimen. Has visit with cardiology in 2 weeks, can repeat INR at that visit. Follow up here in 4 weeks for repeat INR. -     CoaguChek XS/INR Waived -     POCT INR     Continue all other maintenance medications.  Follow up plan: Return in about 6 weeks (around 02/03/2022), or if symptoms worsen or fail to improve, for INR.   Continue healthy lifestyle choices, including diet (rich in fruits, vegetables, and lean proteins, and low in salt and simple  carbohydrates) and exercise (at least 30 minutes of moderate physical activity daily).  Educational handout given for anticoagulation calendar.   The above assessment and management plan was discussed with the patient. The patient verbalized understanding of and has agreed to the management plan. Patient is aware to call the clinic if they develop any new symptoms or if symptoms persist or worsen. Patient is aware when to return to the clinic for a follow-up visit. Patient educated on when it is appropriate to go to the emergency department.   Monia Pouch, FNP-C [facility] [phone]

## 2021-12-26 ENCOUNTER — Other Ambulatory Visit: Payer: Self-pay | Admitting: Family Medicine

## 2021-12-26 DIAGNOSIS — I63512 Cerebral infarction due to unspecified occlusion or stenosis of left middle cerebral artery: Secondary | ICD-10-CM

## 2021-12-30 ENCOUNTER — Other Ambulatory Visit: Payer: Self-pay | Admitting: Family Medicine

## 2021-12-30 DIAGNOSIS — L409 Psoriasis, unspecified: Secondary | ICD-10-CM

## 2022-01-07 ENCOUNTER — Encounter: Payer: Self-pay | Admitting: Cardiology

## 2022-01-07 ENCOUNTER — Ambulatory Visit: Payer: Medicare Other | Attending: Cardiology | Admitting: Cardiology

## 2022-01-07 VITALS — BP 120/75 | HR 69 | Ht 60.0 in | Wt 138.6 lb

## 2022-01-07 DIAGNOSIS — D6869 Other thrombophilia: Secondary | ICD-10-CM

## 2022-01-07 DIAGNOSIS — I4811 Longstanding persistent atrial fibrillation: Secondary | ICD-10-CM | POA: Diagnosis not present

## 2022-01-07 DIAGNOSIS — I10 Essential (primary) hypertension: Secondary | ICD-10-CM

## 2022-01-07 DIAGNOSIS — E782 Mixed hyperlipidemia: Secondary | ICD-10-CM | POA: Diagnosis not present

## 2022-01-07 NOTE — Progress Notes (Signed)
Clinical Summary Ms. Bencosme is a 88 y.o.female seen today for follow up of the following medical problems.      1. Long standing persistent afib - diagnosed 10/2018 during 10/2018 - no recent palpitations - no lightheadendess or dizziness. - doing well on coumadin. Could not use DOAC due to being on primidone. INR follwoed by pcp       - low HRs recent pcp visit, lowered propranolol to '10mg'$  bid. From pcp note would be ok coming off propanolol if neccesary and treating tremor with just primidone.  - feels nervous inside.  - no bleeding on coumadin.   07/2021 3 day monitor: Patient in afib throughout study. Rates 36-112, Avg HR 62  - after monitor given low HRs propranolol was stopped  - no recent palpitations - no bleeding on coumadin.     2. History of CVA - has been on ASA '81mg'$  every other day it appears - admit 10/2018 at [facility], left middle cerebral. Recs were for coumadin at that time and ASA   - left MCA thrombectomy   3.HTN - has some component of white coat HTN   - compliant with meds - home bp's 120s/70s     4. Tremor - she is on propranolol, primidone    5.Hyperlipidemia - labs per pcp - she is on atorvastatin   Past Medical History:  Diagnosis Date   Acute cystitis with hematuria 11/22/2020   Atrial fibrillation (HCC)    BCC (basal cell carcinoma of skin) 11/14/2004   Right Mid Cheek(Cx3,5FU)   BCC (basal cell carcinoma of skin) 04/16/2011   Right Crease(tx p bx)   BCC (basal cell carcinoma of skin) 06/16/2016   Left Nare(tx p bx)   BCC (basal cell carcinoma of skin) Atypical Basaloid 09/29/2018   Left Side Nose(tx p bx)   Hypertension    Osteoporosis    Pneumonia of lower lobe due to infectious organism 11/20/2020   SCC (squamous cell carcinoma) 09/29/2018   Left Jawline(tx p bx)   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   Right Jawline(Cx3,5FU) and Left Upper Cheek(Cx3,5FU)   SCC (squamous cell carcinoma) x 4  06/16/2016   Left Jawline(tx p bx), Nose(tx p bx), Right Cheek(tx p bx), and Right Forearm(tx p bx)   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   Right Temple(Cx3,5FU) and Left Outer Eye(Cx3,5FU)   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   Right Forearm(Cx3,5FU)   Squamous cell carcinoma in situ (SCCIS) 03/13/2005   Left Side of Nose(Cx3,5FU)   Squamous cell carcinoma in situ (SCCIS) 06/04/2005   Inner Right Cheek/Eye(Tx p Bx)   Squamous cell carcinoma in situ (SCCIS) 02/12/2010   Right Cheek(tx p bx)   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 2 09/29/2018   Upper Lip(tx p bx) and Bridge of Nose(tx p bx)   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   Left Forehead, Left Neck, Right Nose, and Right Hand   Stroke (HCC)    Tremor    Weakness 11/20/2020     Allergies  Allergen Reactions   Codeine Other (See Comments)   Influenza Vaccines    Remeron [Mirtazapine] Other (See Comments)    Dizziness    Sulfa Antibiotics Hives     Current Outpatient Medications  Medication Sig Dispense Refill   amLODipine (NORVASC) 5 MG tablet TAKE 1 AND 1/2 TABLETS BY MOUTH DAILY 135 tablet 1   aspirin EC  81 MG tablet Take 81 mg by mouth 3 (three) times a week.     atorvastatin (LIPITOR) 40 MG tablet TAKE 1 TABLET BY MOUTH AT BEDTIME 30 tablet 0   clobetasol cream (TEMOVATE) 0.05 % APPLY TO THE AFFECTED AREA(S) TWICE DAILY UNTIL PSORIATIC LESION RESOLVED 120 g 2   lisinopril (ZESTRIL) 30 MG tablet TAKE 1 TABLET BY MOUTH EVERY DAY (note DOSE CHANGE) 90 tablet 0   polyethylene glycol (MIRALAX / GLYCOLAX) 17 g packet Take 17 g by mouth daily.     primidone (MYSOLINE) 50 MG tablet TAKE 1 AND 1/2 TABLETS BY MOUTH THREE TIMES DAILY 405 tablet 2   warfarin (COUMADIN) 5 MG tablet Take 1.5 tablets (7.5 mg total) by mouth daily at 4 PM. On Monday, Tuesday, and Saturday take 1 tablet. (Patient taking differently: Take 7.5 mg by mouth daily at 4 PM. Patient takes 1 1/2 tablets  Monday through Saturday and 1 tablet on Sundays) 135 tablet 3   mirtazapine (REMERON) 7.5 MG tablet Take 1 tablet (7.5 mg total) by mouth at bedtime. (Patient not taking: Reported on 12/23/2021) 90 tablet 0   No current facility-administered medications for this visit.     Past Surgical History:  Procedure Laterality Date   CHOLECYSTECTOMY     skin biopsies       Allergies  Allergen Reactions   Codeine Other (See Comments)   Influenza Vaccines    Remeron [Mirtazapine] Other (See Comments)    Dizziness    Sulfa Antibiotics Hives      Family History  Problem Relation Age of Onset   Dementia Mother    Stroke Father    Heart disease Father    Diabetes Sister    Stroke Son      Social History Ms. Bartley reports that she has never smoked. She has never used smokeless tobacco. Ms. Abruzzo reports no history of alcohol use.   Review of Systems CONSTITUTIONAL: No weight loss, fever, chills, weakness or fatigue.  HEENT: Eyes: No visual loss, blurred vision, double vision or yellow sclerae.No hearing loss, sneezing, congestion, runny nose or sore throat.  SKIN: No rash or itching.  CARDIOVASCULAR: per hpi RESPIRATORY: No shortness of breath, cough or sputum.  GASTROINTESTINAL: No anorexia, nausea, vomiting or diarrhea. No abdominal pain or blood.  GENITOURINARY: No burning on urination, no polyuria NEUROLOGICAL: No headache, dizziness, syncope, paralysis, ataxia, numbness or tingling in the extremities. No change in bowel or bladder control.  MUSCULOSKELETAL: No muscle, back pain, joint pain or stiffness.  LYMPHATICS: No enlarged nodes. No history of splenectomy.  PSYCHIATRIC: No history of depression or anxiety.  ENDOCRINOLOGIC: No reports of sweating, cold or heat intolerance. No polyuria or polydipsia.  Marland Kitchen   Physical Examination Today's Vitals   01/07/22 1413 01/07/22 1440  BP: (!) 140/64 120/75  Pulse: 69   SpO2: 94%   Weight: 138 lb 9.6 oz (62.9 kg)   Height: 5'  (1.524 m)   PainSc: 0-No pain    Body mass index is 27.07 kg/m.  Gen: resting comfortably, no acute distress HEENT: no scleral icterus, pupils equal round and reactive, no palptable cervical adenopathy,  CV: irreg, no m/r/ gno jvd Resp: Clear to auscultation bilaterally GI: abdomen is soft, non-tender, non-distended, normal bowel sounds, no hepatosplenomegaly MSK: extremities are warm, no edema.  Skin: warm, no rash Neuro:  no focal deficits Psych: appropriate affect   Diagnostic Studies  07/2021 3 day monitor 3 day monitor   Patient in afib throughout study.  Rates 36-112, Avg HR 62   Rare ventricular ectopy in the form of PVCs   No symptoms reported     Patch Wear Time:  3 days and 18 hours (2023-06-23T15:57:54-0400 to 2023-06-27T10:03:52-0400)   Atrial Fibrillation occurred continuously (100% burden), ranging from 36-112 bpm (avg of 62 bpm). Isolated VEs were rare (<1.0%), and no VE Couplets or VE Triplets were present.    Assessment and Plan  1. Afib/acquired thrombophilia - on coumadin, could not take DOAC due to interaction with primidone - low HRs at times on monitor, we stopped her propanolol - continue current meds   2. HTN - at goal, continue current meds  3.Hyperlipidemia - labs per pcp - continue atorvastatin   F/u 6 months  Arnoldo Lenis, M.D.

## 2022-01-07 NOTE — Patient Instructions (Signed)

## 2022-01-15 DIAGNOSIS — L110 Acquired keratosis follicularis: Secondary | ICD-10-CM | POA: Diagnosis not present

## 2022-01-15 DIAGNOSIS — M79672 Pain in left foot: Secondary | ICD-10-CM | POA: Diagnosis not present

## 2022-01-15 DIAGNOSIS — I739 Peripheral vascular disease, unspecified: Secondary | ICD-10-CM | POA: Diagnosis not present

## 2022-01-15 DIAGNOSIS — M79671 Pain in right foot: Secondary | ICD-10-CM | POA: Diagnosis not present

## 2022-01-20 ENCOUNTER — Encounter: Payer: Self-pay | Admitting: Family Medicine

## 2022-01-20 ENCOUNTER — Ambulatory Visit (INDEPENDENT_AMBULATORY_CARE_PROVIDER_SITE_OTHER): Payer: Medicare Other | Admitting: Family Medicine

## 2022-01-20 VITALS — BP 132/68 | HR 83 | Temp 97.3°F | Ht 60.0 in | Wt 133.8 lb

## 2022-01-20 DIAGNOSIS — I4811 Longstanding persistent atrial fibrillation: Secondary | ICD-10-CM

## 2022-01-20 DIAGNOSIS — Z5181 Encounter for therapeutic drug level monitoring: Secondary | ICD-10-CM | POA: Diagnosis not present

## 2022-01-20 DIAGNOSIS — L089 Local infection of the skin and subcutaneous tissue, unspecified: Secondary | ICD-10-CM

## 2022-01-20 DIAGNOSIS — Z7901 Long term (current) use of anticoagulants: Secondary | ICD-10-CM

## 2022-01-20 LAB — POCT INR: INR: 2 (ref 2–3)

## 2022-01-20 LAB — COAGUCHEK XS/INR WAIVED
INR: 2 — ABNORMAL HIGH (ref 0.9–1.1)
Prothrombin Time: 23.8 s

## 2022-01-20 MED ORDER — MUPIROCIN 2 % EX OINT: 1.0000 | TOPICAL_OINTMENT | Freq: Two times a day (BID) | CUTANEOUS | 0 refills | Status: AC

## 2022-01-20 NOTE — Progress Notes (Signed)
Subjective: CC: INR follow-up PCP: [name], DO Dominique Farley is a 89 y.o. female presenting to clinic today for:  1.  Atrial fibrillation Patient has been taking 1 and half tablets daily except for 1 tablet on Saturdays.  No bleeding.  No heart palpitations.  No dizziness or falls.  She is brought to the office by her daughter today.  2.  Skin lesion She identifies a skin lesion on her right upper back that was identified yesterday.  Appears slightly bigger today.  It is not using anything but appears to be pus field.  Denies any pain or fevers.  Not utilizing anything on this lesion today   ROS: Per HPI  Allergies  Allergen Reactions   Codeine Other (See Comments)   Influenza Vaccines    Remeron [Mirtazapine] Other (See Comments)    Dizziness    Sulfa Antibiotics Hives   Past Medical History:  Diagnosis Date   Acute cystitis with hematuria 11/22/2020   Atrial fibrillation (HCC)    BCC (basal cell carcinoma of skin) 11/14/2004   Right Mid Cheek(Cx3,5FU)   BCC (basal cell carcinoma of skin) 04/16/2011   Right Crease(tx p bx)   BCC (basal cell carcinoma of skin) 06/16/2016   Left Nare(tx p bx)   BCC (basal cell carcinoma of skin) Atypical Basaloid 09/29/2018   Left Side Nose(tx p bx)   Hypertension    Osteoporosis    Pneumonia of lower lobe due to infectious organism 11/20/2020   SCC (squamous cell carcinoma) 09/29/2018   Left Jawline(tx p bx)   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   Right Jawline(Cx3,5FU) and Left Upper Cheek(Cx3,5FU)   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   Left Jawline(tx p bx), Nose(tx p bx), Right Cheek(tx p bx), and Right Forearm(tx p bx)   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   Right Temple(Cx3,5FU) and Left Outer Eye(Cx3,5FU)   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   Right Forearm(Cx3,5FU)   Squamous cell carcinoma in situ (SCCIS) 03/13/2005   Left Side of Nose(Cx3,5FU)   Squamous  cell carcinoma in situ (SCCIS) 06/04/2005   Inner Right Cheek/Eye(Tx p Bx)   Squamous cell carcinoma in situ (SCCIS) 02/12/2010   Right Cheek(tx p bx)   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 2 09/29/2018   Upper Lip(tx p bx) and Bridge of Nose(tx p bx)   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   Left Forehead, Left Neck, Right Nose, and Right Hand   Stroke ([place])    Tremor    Weakness 11/20/2020    Current Outpatient Medications:    amLODipine (NORVASC) 5 MG tablet, TAKE 1 AND 1/2 TABLETS BY MOUTH DAILY, Disp: 135 tablet, Rfl: 1   aspirin EC 81 MG tablet, Take 81 mg by mouth 3 (three) times a week., Disp: , Rfl:    atorvastatin (LIPITOR) 40 MG tablet, TAKE 1 TABLET BY MOUTH AT BEDTIME, Disp: 30 tablet, Rfl: 0   clobetasol cream (TEMOVATE) 0.05 %, APPLY TO THE AFFECTED AREA(S) TWICE DAILY UNTIL PSORIATIC LESION RESOLVED, Disp: 120 g, Rfl: 2   lisinopril (ZESTRIL) 30 MG tablet, TAKE 1 TABLET BY MOUTH EVERY DAY (note DOSE CHANGE), Disp: 90 tablet, Rfl: 0   polyethylene glycol (MIRALAX / GLYCOLAX) 17 g packet, Take 17 g by mouth daily., Disp: , Rfl:    primidone (MYSOLINE) 50 MG tablet, TAKE 1 AND 1/2 TABLETS BY MOUTH THREE TIMES DAILY, Disp: 405 tablet,  Rfl: 2   warfarin (COUMADIN) 5 MG tablet, Take 1.5 tablets (7.5 mg total) by mouth daily at 4 PM. On Monday, Tuesday, and Saturday take 1 tablet. (Patient taking differently: Take 7.5 mg by mouth daily at 4 PM. Patient takes 1 1/2 tablets Monday through Saturday and 1 tablet on Sundays), Disp: 135 tablet, Rfl: 3   mirtazapine (REMERON) 7.5 MG tablet, Take 1 tablet (7.5 mg total) by mouth at bedtime. (Patient not taking: Reported on 12/23/2021), Disp: 90 tablet, Rfl: 0 Social History   Socioeconomic History   Marital status: Widowed    Spouse name: Not on file   Number of children: 3   Years of education: Not on file   Highest education level: Not on file  Occupational History   Occupation: Retired  Tobacco Use   Smoking  status: Never   Smokeless tobacco: Never  Vaping Use   Vaping Use: Never used  Substance and Sexual Activity   Alcohol use: Never   Drug use: Never   Sexual activity: Not Currently  Other Topics Concern   Not on file  Social History Narrative   Not on file   Social Determinants of Health   Financial Resource Strain: Not on file  Food Insecurity: Not on file  Transportation Needs: Not on file  Physical Activity: Not on file  Stress: Not on file  Social Connections: Not on file  Intimate Partner Violence: Not on file   Family History  Problem Relation Age of Onset   Dementia Mother    Stroke Father    Heart disease Father    Diabetes Sister    Stroke Son     Objective: Office vital signs reviewed. BP 132/68   Pulse 83   Temp (!) 97.3 F (36.3 C)   Ht 5' (1.524 m)   Wt 133 lb 12.8 oz (60.7 kg)   SpO2 95%   BMI 26.13 kg/m   Physical Examination:  General: Awake, alert, well appearing elderly female, No acute distress HEENT: Sclera white.  Moist mucous membranes. Cardio: Irregularly irregular with murmur present Pulm: clear to auscultation bilaterally, no wheezes, rhonchi or rales; normal work of breathing on room air    Assessment/ Plan: 89 y.o. female   Anticoagulation goal of INR 2 to 3 - Plan: CoaguChek XS/INR Waived, POCT INR  Longstanding persistent atrial fibrillation (HCC) - Plan: CoaguChek XS/INR Waived, POCT INR  Skin pustule - Plan: mupirocin ointment (BACTROBAN) 2 %  INR at goal at 2.0 today.  No changes.  I gave them a copy of her schedule today and we will follow-up in 6 weeks, sooner if concerns arise  For the lesion on her back because it does appear to be pustular in nature and going to give her a little Bactroban cream to apply topically.  Continue to monitor closely  Orders Placed This Encounter  Procedures   CoaguChek XS/INR Waived   No orders of the defined types were placed in this encounter.    [name], DO [place] [phone]

## 2022-01-27 ENCOUNTER — Other Ambulatory Visit: Payer: Self-pay | Admitting: Family Medicine

## 2022-01-27 DIAGNOSIS — I63512 Cerebral infarction due to unspecified occlusion or stenosis of left middle cerebral artery: Secondary | ICD-10-CM

## 2022-02-23 ENCOUNTER — Other Ambulatory Visit: Payer: Self-pay | Admitting: Family Medicine

## 2022-02-23 DIAGNOSIS — I63512 Cerebral infarction due to unspecified occlusion or stenosis of left middle cerebral artery: Secondary | ICD-10-CM

## 2022-02-23 DIAGNOSIS — I10 Essential (primary) hypertension: Secondary | ICD-10-CM

## 2022-02-23 DIAGNOSIS — I4811 Longstanding persistent atrial fibrillation: Secondary | ICD-10-CM

## 2022-02-26 ENCOUNTER — Emergency Department (HOSPITAL_COMMUNITY): Payer: Medicare Other

## 2022-02-26 ENCOUNTER — Other Ambulatory Visit: Payer: Self-pay

## 2022-02-26 ENCOUNTER — Emergency Department (HOSPITAL_COMMUNITY)
Admission: EM | Admit: 2022-02-26 | Discharge: 2022-02-26 | Disposition: A | Discharge status: Home / Self Care | Payer: Medicare Other | Attending: Student | Admitting: Student

## 2022-02-26 DIAGNOSIS — M4802 Spinal stenosis, cervical region: Secondary | ICD-10-CM | POA: Diagnosis not present

## 2022-02-26 DIAGNOSIS — W1830XA Fall on same level, unspecified, initial encounter: Secondary | ICD-10-CM | POA: Diagnosis not present

## 2022-02-26 DIAGNOSIS — S8011XA Contusion of right lower leg, initial encounter: Secondary | ICD-10-CM | POA: Diagnosis not present

## 2022-02-26 DIAGNOSIS — Z7982 Long term (current) use of aspirin: Secondary | ICD-10-CM | POA: Insufficient documentation

## 2022-02-26 DIAGNOSIS — Z79899 Other long term (current) drug therapy: Secondary | ICD-10-CM | POA: Diagnosis not present

## 2022-02-26 DIAGNOSIS — Z743 Need for continuous supervision: Secondary | ICD-10-CM | POA: Diagnosis not present

## 2022-02-26 DIAGNOSIS — Y9301 Activity, walking, marching and hiking: Secondary | ICD-10-CM | POA: Insufficient documentation

## 2022-02-26 DIAGNOSIS — Z7901 Long term (current) use of anticoagulants: Secondary | ICD-10-CM | POA: Insufficient documentation

## 2022-02-26 DIAGNOSIS — R0902 Hypoxemia: Secondary | ICD-10-CM | POA: Diagnosis not present

## 2022-02-26 DIAGNOSIS — M4312 Spondylolisthesis, cervical region: Secondary | ICD-10-CM | POA: Diagnosis not present

## 2022-02-26 DIAGNOSIS — D72829 Elevated white blood cell count, unspecified: Secondary | ICD-10-CM | POA: Insufficient documentation

## 2022-02-26 DIAGNOSIS — S0990XA Unspecified injury of head, initial encounter: Secondary | ICD-10-CM | POA: Diagnosis not present

## 2022-02-26 DIAGNOSIS — I951 Orthostatic hypotension: Secondary | ICD-10-CM | POA: Insufficient documentation

## 2022-02-26 DIAGNOSIS — Y92010 Kitchen of single-family (private) house as the place of occurrence of the external cause: Secondary | ICD-10-CM | POA: Diagnosis not present

## 2022-02-26 DIAGNOSIS — I10 Essential (primary) hypertension: Secondary | ICD-10-CM | POA: Insufficient documentation

## 2022-02-26 DIAGNOSIS — R42 Dizziness and giddiness: Secondary | ICD-10-CM | POA: Diagnosis not present

## 2022-02-26 DIAGNOSIS — S8012XA Contusion of left lower leg, initial encounter: Secondary | ICD-10-CM | POA: Diagnosis not present

## 2022-02-26 DIAGNOSIS — Z8673 Personal history of transient ischemic attack (TIA), and cerebral infarction without residual deficits: Secondary | ICD-10-CM | POA: Diagnosis not present

## 2022-02-26 DIAGNOSIS — Z85828 Personal history of other malignant neoplasm of skin: Secondary | ICD-10-CM | POA: Insufficient documentation

## 2022-02-26 DIAGNOSIS — E860 Dehydration: Secondary | ICD-10-CM | POA: Insufficient documentation

## 2022-02-26 DIAGNOSIS — G9389 Other specified disorders of brain: Secondary | ICD-10-CM | POA: Diagnosis not present

## 2022-02-26 LAB — CBC WITH DIFFERENTIAL/PLATELET
Abs Immature Granulocytes: 0.02 10*3/uL (ref 0.00–0.07)
Basophils Absolute: 0 10*3/uL (ref 0.0–0.1)
Basophils Relative: 0 %
Eosinophils Absolute: 0 10*3/uL (ref 0.0–0.5)
Eosinophils Relative: 0 %
HCT: 39.2 % (ref 36.0–46.0)
Hemoglobin: 12.7 g/dL (ref 12.0–15.0)
Immature Granulocytes: 0 %
Lymphocytes Relative: 6 %
Lymphs Abs: 0.7 10*3/uL (ref 0.7–4.0)
MCH: 32.2 pg (ref 26.0–34.0)
MCHC: 32.4 g/dL (ref 30.0–36.0)
MCV: 99.2 fL (ref 80.0–100.0)
Monocytes Absolute: 0.7 10*3/uL (ref 0.1–1.0)
Monocytes Relative: 7 %
Neutro Abs: 9.2 10*3/uL — ABNORMAL HIGH (ref 1.7–7.7)
Neutrophils Relative %: 87 %
Platelets: 225 10*3/uL (ref 150–400)
RBC: 3.95 MIL/uL (ref 3.87–5.11)
RDW: 14.4 % (ref 11.5–15.5)
WBC: 10.6 10*3/uL — ABNORMAL HIGH (ref 4.0–10.5)
nRBC: 0 % (ref 0.0–0.2)

## 2022-02-26 LAB — COMPREHENSIVE METABOLIC PANEL
ALT: 23 U/L (ref 0–44)
AST: 31 U/L (ref 15–41)
Albumin: 3.6 g/dL (ref 3.5–5.0)
Alkaline Phosphatase: 73 U/L (ref 38–126)
Anion gap: 8 (ref 5–15)
BUN: 22 mg/dL (ref 8–23)
CO2: 24 mmol/L (ref 22–32)
Calcium: 8.8 mg/dL — ABNORMAL LOW (ref 8.9–10.3)
Chloride: 105 mmol/L (ref 98–111)
Creatinine, Ser: 0.6 mg/dL (ref 0.44–1.00)
GFR, Estimated: >60 mL/min (ref >60)
Glucose, Bld: 123 mg/dL — ABNORMAL HIGH (ref 70–99)
Potassium: 4.1 mmol/L (ref 3.5–5.1)
Sodium: 137 mmol/L (ref 135–145)
Total Bilirubin: 0.8 mg/dL (ref 0.3–1.2)
Total Protein: 6.7 g/dL (ref 6.5–8.1)

## 2022-02-26 LAB — TROPONIN I (HIGH SENSITIVITY)
Troponin I (High Sensitivity): 27 ng/L — ABNORMAL HIGH (ref <18)
Troponin I (High Sensitivity): 34 ng/L — ABNORMAL HIGH (ref <18)

## 2022-02-26 LAB — URINALYSIS, ROUTINE W REFLEX MICROSCOPIC
Bilirubin Urine: NEGATIVE
Glucose, UA: NEGATIVE mg/dL
Hgb urine dipstick: NEGATIVE
Ketones, ur: 20 mg/dL — AB
Leukocytes,Ua: NEGATIVE
Nitrite: NEGATIVE
Protein, ur: NEGATIVE mg/dL
Specific Gravity, Urine: 1.019 (ref 1.005–1.030)
pH: 5 (ref 5.0–8.0)

## 2022-02-26 LAB — PROTIME-INR
INR: 1.6 — ABNORMAL HIGH (ref 0.8–1.2)
Prothrombin Time: 18.7 seconds — ABNORMAL HIGH (ref 11.4–15.2)

## 2022-02-26 MED ORDER — SODIUM CHLORIDE 0.9 % IV BOLUS: 500.0000 mL | Freq: Once | INTRAVENOUS | Status: DC

## 2022-02-26 MED ORDER — LACTATED RINGERS IV BOLUS
1000.0000 mL | Freq: Once | INTRAVENOUS | Status: AC
  Administered 2022-02-26: 1000 mL via INTRAVENOUS

## 2022-02-26 NOTE — ED Notes (Signed)
Cleaned and dressed pts bilateral skin tears

## 2022-02-26 NOTE — ED Provider Notes (Signed)
Pt signed out by Dr. Matilde Sprang pending UA.  UA + ketones, but no infection.  Pt feels much better.  She is able to ambulate.  She is stable for d/c.  Return if worse.    Isla Pence, MD 02/26/22 1754

## 2022-02-26 NOTE — ED Notes (Signed)
Pt walked from one side of their room to the other, without any difficulty. Pt held onto writer's hand and did well. Pt back on her stretcher, RN and MD notified.

## 2022-02-26 NOTE — ED Provider Notes (Signed)
[facility] Provider Note  CSN: [id number] Arrival date & time: 02/26/22 1014  Chief Complaint(s) Dizziness and Fall  HPI Dominique Farley is a 89 y.o. female with PMH HTN, multiple squamous cells of the face that been removed, previous CVA, baseline tremor who presents emergency department for evaluation of a fall.  Patient states that she got out of bed this morning quickly and walked into the kitchen, when she felt weak at the knees and fell to the ground.  Currently denies chest pain, shortness of breath, abdominal pain, nausea, vomiting or other systemic symptoms.  She arrives with bruising to bilateral lower extremities but patient states that these are old bruises and did not happen today.   Past Medical History Past Medical History:  Diagnosis Date   Acute cystitis with hematuria 11/22/2020   Atrial fibrillation ([place])    BCC (basal cell carcinoma of skin) 11/14/2004   Right Mid Cheek(Cx3,5FU)   BCC (basal cell carcinoma of skin) 04/16/2011   Right Crease(tx p bx)   BCC (basal cell carcinoma of skin) 06/16/2016   Left Nare(tx p bx)   BCC (basal cell carcinoma of skin) Atypical Basaloid 09/29/2018   Left Side Nose(tx p bx)   Hypertension    Osteoporosis    Pneumonia of lower lobe due to infectious organism 11/20/2020   SCC (squamous cell carcinoma) 09/29/2018   Left Jawline(tx p bx)   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   Right Jawline(Cx3,5FU) and Left Upper Cheek(Cx3,5FU)   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   Left Jawline(tx p bx), Nose(tx p bx), Right Cheek(tx p bx), and Right Forearm(tx p bx)   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   Right Temple(Cx3,5FU) and Left Outer Eye(Cx3,5FU)   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   Right Forearm(Cx3,5FU)   Squamous cell carcinoma in situ (SCCIS) 03/13/2005   Left Side of Nose(Cx3,5FU)   Squamous cell carcinoma in situ (SCCIS) 06/04/2005    Inner Right Cheek/Eye(Tx p Bx)   Squamous cell carcinoma in situ (SCCIS) 02/12/2010   Right Cheek(tx p bx)   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 2 09/29/2018   Upper Lip(tx p bx) and Bridge of Nose(tx p bx)   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   Left Forehead, Left Neck, Right Nose, and Right Hand   Stroke ([place])    Tremor    Weakness 11/20/2020   Patient Active Problem List   Diagnosis Date Noted   Chronic anticoagulation 01/24/2021   Squamous cell carcinoma in situ (SCCIS) of skin 10/25/2019   Unilateral primary osteoarthritis, left knee 07/12/2019   Bilateral primary osteoarthritis of knee 06/14/2019   Atrial fibrillation ([place]) 11/04/2018   Anticoagulation goal of INR 2 to 3 11/04/2018   Cerebrovascular accident (CVA) due to occlusion of left middle cerebral artery ([place]) 10/18/2018   Unilateral primary osteoarthritis, right knee 10/05/2018   Tremor 08/24/2018   Essential hypertension 08/24/2018   Age related osteoporosis 08/24/2018   Home Medication(s) Prior to Admission medications   Medication Sig Start Date End Date Taking? Authorizing Provider  amLODipine (NORVASC) 5 MG tablet TAKE 1 AND 1/2 TABLETS BY MOUTH DAILY 02/23/22  Yes Ronnie Doss M, DO  aspirin EC 81 MG tablet Take 81 mg by mouth 3 (three) times a week.   Yes [provider]  atorvastatin (LIPITOR) 40 MG tablet TAKE 1 TABLET BY MOUTH AT BEDTIME 01/27/22  Yes Gottschalk,  Ashly M, DO  lisinopril (ZESTRIL) 30 MG tablet TAKE 1 TABLET BY MOUTH EVERY DAY (note DOSE CHANGE) 02/23/22  Yes Gottschalk, Ashly M, DO  polyethylene glycol (MIRALAX / GLYCOLAX) 17 g packet Take 17 g by mouth daily.   Yes [provider]  primidone (MYSOLINE) 50 MG tablet TAKE 1 AND 1/2 TABLETS BY MOUTH THREE TIMES DAILY 07/28/21  Yes Ronnie Doss M, DO  warfarin (COUMADIN) 5 MG tablet TAKE 1 AND 1/2 TABLETS BY MOUTH DAILY AT 4pm EXCEPT ON MONDAYS, TUESDAYS, AND SATURDAYS TAKE 1 TABLET  DAILY. Patient taking differently: Takes 1 and 1/2 tablet every day except 1 tablet on Saturday 02/23/22  Yes Gottschalk, Ashly M, DO  clobetasol cream (TEMOVATE) 0.05 % APPLY TO THE AFFECTED AREA(S) TWICE DAILY UNTIL PSORIATIC LESION RESOLVED Patient not taking: Reported on 02/26/2022 12/30/21   [name], DO  mirtazapine (REMERON) 7.5 MG tablet Take 1 tablet (7.5 mg total) by mouth at bedtime. Patient not taking: Reported on 12/23/2021 12/09/21   [name], DO                                                                                                                                    Past Surgical History Past Surgical History:  Procedure Laterality Date   CHOLECYSTECTOMY     skin biopsies     Family History Family History  Problem Relation Age of Onset   Dementia Mother    Stroke Father    Heart disease Father    Diabetes Sister    Stroke Son     Social History Social History   Tobacco Use   Smoking status: Never   Smokeless tobacco: Never  Vaping Use   Vaping Use: Never used  Substance Use Topics   Alcohol use: Never   Drug use: Never   Allergies Codeine, Influenza vaccines, Remeron [mirtazapine], and Sulfa antibiotics  Review of Systems Review of Systems  Neurological:  Positive for light-headedness.    Physical Exam Vital Signs  I have reviewed the triage vital signs BP (!) 164/67   Pulse 85   Temp 98.6 F (37 C) (Oral)   Resp (!) 23   Ht 5' 5"$  (1.651 m)   Wt 60.7 kg   SpO2 98%   BMI 22.27 kg/m   Physical Exam Vitals and nursing note reviewed.  Constitutional:      General: She is not in acute distress.    Appearance: She is well-developed.  HENT:     Head: Normocephalic and atraumatic.  Eyes:     Conjunctiva/sclera: Conjunctivae normal.  Cardiovascular:     Rate and Rhythm: Normal rate and regular rhythm.     Heart sounds: No murmur heard. Pulmonary:     Effort: Pulmonary effort is normal. No respiratory distress.      Breath sounds: Normal breath sounds.  Abdominal:     Palpations: Abdomen is soft.  Tenderness: There is no abdominal tenderness.  Musculoskeletal:        General: No swelling.     Cervical back: Neck supple.  Skin:    General: Skin is warm and dry.     Capillary Refill: Capillary refill takes less than 2 seconds.     Findings: Bruising present.  Neurological:     Mental Status: She is alert.  Psychiatric:        Mood and Affect: Mood normal.     ED Results and Treatments Labs (all labs ordered are listed, but only abnormal results are displayed) Labs Reviewed  COMPREHENSIVE METABOLIC PANEL - Abnormal; Notable for the following components:      Result Value   Glucose, Bld 123 (*)    Calcium 8.8 (*)    All other components within normal limits  CBC WITH DIFFERENTIAL/PLATELET - Abnormal; Notable for the following components:   WBC 10.6 (*)    Neutro Abs 9.2 (*)    All other components within normal limits  URINALYSIS, ROUTINE W REFLEX MICROSCOPIC - Abnormal; Notable for the following components:   Ketones, ur 20 (*)    All other components within normal limits  PROTIME-INR - Abnormal; Notable for the following components:   Prothrombin Time 18.7 (*)    INR 1.6 (*)    All other components within normal limits  TROPONIN I (HIGH SENSITIVITY) - Abnormal; Notable for the following components:   Troponin I (High Sensitivity) 27 (*)    All other components within normal limits  TROPONIN I (HIGH SENSITIVITY) - Abnormal; Notable for the following components:   Troponin I (High Sensitivity) 34 (*)    All other components within normal limits                                                                                                                          Radiology CT Cervical Spine Wo Contrast  Result Date: 02/26/2022 CLINICAL DATA:  89 year old female with dizziness and fall. EXAM: CT CERVICAL SPINE WITHOUT CONTRAST TECHNIQUE: Multidetector CT imaging of the cervical  spine was performed without intravenous contrast. Multiplanar CT image reconstructions were also generated. RADIATION DOSE REDUCTION: This exam was performed according to the departmental dose-optimization program which includes automated exposure control, adjustment of the mA and/or kV according to patient size and/or use of iterative reconstruction technique. COMPARISON:  Head CT today reported separately. Report of CTA head and neck [facility] 10/17/2018, thoracic radiographs 04/30/2017 (no images available). FINDINGS: Alignment: Preserved cervical lordosis. Mild degenerative appearing retrolisthesis of C3 on C4. Cervicothoracic junction alignment is within normal limits. Bilateral posterior element alignment is within normal limits. Skull base and vertebrae: Visualized skull base is intact. No atlanto-occipital dissociation. Incidental motion artifact of the visible mandible. C1 and C2 appear intact and aligned. No acute osseous abnormality identified. Soft tissues and spinal canal: No prevertebral fluid or swelling. No visible canal hematoma. Age-appropriate cervical carotid calcified atherosclerosis in the visible neck. Disc levels: Fairly  age-appropriate cervical spine degeneration throughout. C2-C3 ankylosis primarily affecting the posterior elements. Mild adjacent segment C3-C4 spondylolisthesis. Mild if any associated cervical spinal stenosis. Upper chest: T3 mild compression fracture is age indeterminate, not reported on a 2019 thoracic spine radiographic series. Other visible upper thoracic levels appear grossly intact. Negative lung apices. IMPRESSION: 1. No other acute traumatic injury identified in the cervical spine. 2. Mild T3 compression fracture is age indeterminate. 3. Age-appropriate cervical spine degeneration, incidental C2-C3 ankylosis. Electronically Signed   By: Genevie Ann M.D.   On: 02/26/2022 12:41   CT HEAD WO CONTRAST (5MM)  Result Date: 02/26/2022 CLINICAL DATA:  Head  trauma. EXAM: CT HEAD WITHOUT CONTRAST TECHNIQUE: Contiguous axial images were obtained from the base of the skull through the vertex without intravenous contrast. RADIATION DOSE REDUCTION: This exam was performed according to the departmental dose-optimization program which includes automated exposure control, adjustment of the mA and/or kV according to patient size and/or use of iterative reconstruction technique. COMPARISON:  None Available. FINDINGS: Brain: There is periventricular white matter decreased attenuation consistent with small vessel ischemic changes. Ventricles, sulci and cisterns are prominent consistent with age related involutional changes. No acute intracranial hemorrhage, mass effect or shift. No hydrocephalus. Left frontal periventricular encephalomalacia is consistent with an old CVA and there is compensatory dilatation left lateral ventricle. Vascular: No hyperdense vessel or unexpected calcification. Skull: Normal. Negative for fracture or focal lesion. Sinuses/Orbits: No acute finding. IMPRESSION: Atrophy and chronic small vessel ischemic changes. Chronic left-sided CVA. No acute intracranial process identified. Electronically Signed   By: Sammie Bench M.D.   On: 02/26/2022 12:38    Pertinent labs & imaging results that were available during my care of the patient were reviewed by me and considered in my medical decision making (see MDM for details).  Medications Ordered in ED Medications  sodium chloride 0.9 % bolus 500 mL (500 mLs Intravenous Not Given 02/26/22 1741)  lactated ringers bolus 1,000 mL (0 mLs Intravenous Stopped 02/26/22 1350)                                                                                                                                     Procedures Procedures  (including critical care time)  Medical Decision Making / ED Course   This patient presents to the ED for concern of fall, syncope, this involves an extensive number of treatment  options, and is a complaint that carries with it a high risk of complications and morbidity.  The differential diagnosis includes orthostatic syncope, cardiogenic syncope, vasovagal syncope, fracture, hematoma, contusion, ligamentous injury, electrolyte abnormality, UTI  MDM: Patient seen emergency room for evaluation of a fall.  Physical exam with senile purpura on the lower extremities bilaterally but is otherwise unremarkable.  No significant tenderness palpation over these areas.  Laboratory evaluation with a mild leukocytosis to 10.6 INR subtherapeutic at 1.6, high-sensitivity troponin 27 and a very minimal uptrend to  34 but this is almost certainly demand ischemia but the patient has no chest pain or shortness of breath and I have low suspicion for ACS at this time.  CT head and C-spine unremarkable.  ECG with A-fib but rate controlled and no evidence of dysrhythmia.  At time of signout, patient pending urinalysis.  Please see provider signout note for continuation of workup.  Anticipate discharge home regardless as the patient is very well-appearing.  If she is able to ambulate she is almost certainly safe for discharge.  Suspect orthostatic syncope today given symptoms initiated after rapid change in movement.   Additional history obtained: -Additional history obtained from multiple family members -External records from outside source obtained and reviewed including: Chart review including previous notes, labs, imaging, consultation notes   Lab Tests: -I ordered, reviewed, and interpreted labs.   The pertinent results include:   Labs Reviewed  COMPREHENSIVE METABOLIC PANEL - Abnormal; Notable for the following components:      Result Value   Glucose, Bld 123 (*)    Calcium 8.8 (*)    All other components within normal limits  CBC WITH DIFFERENTIAL/PLATELET - Abnormal; Notable for the following components:   WBC 10.6 (*)    Neutro Abs 9.2 (*)    All other components within normal limits   URINALYSIS, ROUTINE W REFLEX MICROSCOPIC - Abnormal; Notable for the following components:   Ketones, ur 20 (*)    All other components within normal limits  PROTIME-INR - Abnormal; Notable for the following components:   Prothrombin Time 18.7 (*)    INR 1.6 (*)    All other components within normal limits  TROPONIN I (HIGH SENSITIVITY) - Abnormal; Notable for the following components:   Troponin I (High Sensitivity) 27 (*)    All other components within normal limits  TROPONIN I (HIGH SENSITIVITY) - Abnormal; Notable for the following components:   Troponin I (High Sensitivity) 34 (*)    All other components within normal limits      EKG   EKG Interpretation  Date/Time:  Thursday February 26 2022 10:38:17 EST Ventricular Rate:  77 PR Interval:    QRS Duration: 92 QT Interval:  401 QTC Calculation: 454 R Axis:   56 Text Interpretation: Atrial fibrillation RSR' in V1 or V2, probably normal variant Confirmed by [place] ([id number]) on 02/26/2022 11:43:37 AM         Imaging Studies ordered: I ordered imaging studies including CT head and C-spine I independently visualized and interpreted imaging. I agree with the radiologist interpretation   Medicines ordered and prescription drug management: Meds ordered this encounter  Medications   lactated ringers bolus 1,000 mL   sodium chloride 0.9 % bolus 500 mL    -I have reviewed the patients home medicines and have made adjustments as needed  Critical interventions none   Cardiac Monitoring: The patient was maintained on a cardiac monitor.  I personally viewed and interpreted the cardiac monitored which showed an underlying rhythm of: Rate controlled A-fib  Social Determinants of Health:  Factors impacting patients care include: none   Reevaluation: After the interventions noted above, I reevaluated the patient and found that they have :improved  Co morbidities that complicate the patient evaluation  Past  Medical History:  Diagnosis Date   Acute cystitis with hematuria 11/22/2020   Atrial fibrillation ([place])    BCC (basal cell carcinoma of skin) 11/14/2004   Right Mid Cheek(Cx3,5FU)   BCC (basal cell carcinoma of skin) 04/16/2011  Right Crease(tx p bx)   BCC (basal cell carcinoma of skin) 06/16/2016   Left Nare(tx p bx)   BCC (basal cell carcinoma of skin) Atypical Basaloid 09/29/2018   Left Side Nose(tx p bx)   Hypertension    Osteoporosis    Pneumonia of lower lobe due to infectious organism 11/20/2020   SCC (squamous cell carcinoma) 09/29/2018   Left Jawline(tx p bx)   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   Right Jawline(Cx3,5FU) and Left Upper Cheek(Cx3,5FU)   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   Left Jawline(tx p bx), Nose(tx p bx), Right Cheek(tx p bx), and Right Forearm(tx p bx)   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   Right Temple(Cx3,5FU) and Left Outer Eye(Cx3,5FU)   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   Right Forearm(Cx3,5FU)   Squamous cell carcinoma in situ (SCCIS) 03/13/2005   Left Side of Nose(Cx3,5FU)   Squamous cell carcinoma in situ (SCCIS) 06/04/2005   Inner Right Cheek/Eye(Tx p Bx)   Squamous cell carcinoma in situ (SCCIS) 02/12/2010   Right Cheek(tx p bx)   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 2 09/29/2018   Upper Lip(tx p bx) and Bridge of Nose(tx p bx)   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   Left Forehead, Left Neck, Right Nose, and Right Hand   Stroke [facility])    Tremor    Weakness 11/20/2020      Dispostion: I considered admission for this patient, and disposition pending urinalysis and reevaluation by oncoming provider.  Please see provider signout for continuation of workup.     Final Clinical Impression(s) / ED Diagnoses Final diagnoses:  Dehydration  Orthostatic hypotension     @PCDICTATION$ @    Teressa Lower, MD 02/26/22 1958

## 2022-02-26 NOTE — ED Triage Notes (Signed)
[facility]. Pt states she has help or caregivers day M_F but not at nights. This am pt. Sat on side of bed and felt dizziness. She went to kitchen where the dizziness became worse where she lowered herself down to the floor. Pt c/o pain in left hip from fall. She has bilateral skin tears on both arms. Pain is worse with movement but otherwise not painful. Pt on warfarin. Staes previous stroke of 4 yrs ago, and she has tremors normally.

## 2022-02-26 NOTE — ED Notes (Addendum)
EDP in room assessing during triage

## 2022-03-03 ENCOUNTER — Other Ambulatory Visit: Payer: Self-pay | Admitting: Family Medicine

## 2022-03-03 DIAGNOSIS — R630 Anorexia: Secondary | ICD-10-CM

## 2022-03-03 DIAGNOSIS — F4321 Adjustment disorder with depressed mood: Secondary | ICD-10-CM

## 2022-03-10 ENCOUNTER — Encounter: Payer: Self-pay | Admitting: Family Medicine

## 2022-03-10 ENCOUNTER — Ambulatory Visit (INDEPENDENT_AMBULATORY_CARE_PROVIDER_SITE_OTHER): Payer: Medicare Other | Admitting: Family Medicine

## 2022-03-10 VITALS — BP 122/76 | HR 60 | Temp 98.6°F | Ht 65.0 in | Wt 136.0 lb

## 2022-03-10 DIAGNOSIS — I4811 Longstanding persistent atrial fibrillation: Secondary | ICD-10-CM

## 2022-03-10 DIAGNOSIS — D692 Other nonthrombocytopenic purpura: Secondary | ICD-10-CM

## 2022-03-10 DIAGNOSIS — Z8673 Personal history of transient ischemic attack (TIA), and cerebral infarction without residual deficits: Secondary | ICD-10-CM | POA: Insufficient documentation

## 2022-03-10 DIAGNOSIS — Z7901 Long term (current) use of anticoagulants: Secondary | ICD-10-CM

## 2022-03-10 DIAGNOSIS — R54 Age-related physical debility: Secondary | ICD-10-CM | POA: Diagnosis not present

## 2022-03-10 DIAGNOSIS — Z5181 Encounter for therapeutic drug level monitoring: Secondary | ICD-10-CM | POA: Diagnosis not present

## 2022-03-10 LAB — COAGUCHEK XS/INR WAIVED
INR: 2 — ABNORMAL HIGH (ref 0.9–1.1)
Prothrombin Time: 24.2 s

## 2022-03-10 LAB — POCT INR: INR: 2 (ref 2–3)

## 2022-03-10 NOTE — Progress Notes (Signed)
Subjective: [id number] PCP: [name], DO [id number] Dominique Farley is a 89 y.o. female presenting to clinic today for:  1. Afib Goal 2-3 Patient here for interval follow up on INR.  Seen in ER last week due to dehydration. She is accompanied today's visit by [name], her daughter.  She does report that she had a fall but this was mechanical because she was rushing to open a door.  She ultimately was dehydrated slightly but she is not quite sure how she could possibly be dehydrated as she eats and drinks pretty well.  She had some bruises.  Denies any bleeding.  Compliant with her Coumadin 1.5 tablets daily except for 1 tablet on Saturdays.  No chest pain, shortness of breath.   ROS: Per HPI  Allergies  Allergen Reactions   Codeine Other (See Comments)   Influenza Vaccines    Remeron [Mirtazapine] Other (See Comments)    Dizziness    Sulfa Antibiotics Hives   Past Medical History:  Diagnosis Date   Acute cystitis with hematuria 11/22/2020   Atrial fibrillation ([facility])    BCC (basal cell carcinoma of skin) 11/14/2004   Right Mid Cheek(Cx3,5FU)   BCC (basal cell carcinoma of skin) 04/16/2011   Right Crease(tx p bx)   BCC (basal cell carcinoma of skin) 06/16/2016   Left Nare(tx p bx)   BCC (basal cell carcinoma of skin) Atypical Basaloid 09/29/2018   Left Side Nose(tx p bx)   Hypertension    Osteoporosis    Pneumonia of lower lobe due to infectious organism 11/20/2020   SCC (squamous cell carcinoma) 09/29/2018   Left Jawline(tx p bx)   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   Right Jawline(Cx3,5FU) and Left Upper Cheek(Cx3,5FU)   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   Left Jawline(tx p bx), Nose(tx p bx), Right Cheek(tx p bx), and Right Forearm(tx p bx)   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   Right Temple(Cx3,5FU) and Left Outer Eye(Cx3,5FU)   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   Right Forearm(Cx3,5FU)   Squamous cell carcinoma in  situ (SCCIS) 03/13/2005   Left Side of Nose(Cx3,5FU)   Squamous cell carcinoma in situ (SCCIS) 06/04/2005   Inner Right Cheek/Eye(Tx p Bx)   Squamous cell carcinoma in situ (SCCIS) 02/12/2010   Right Cheek(tx p bx)   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 2 09/29/2018   Upper Lip(tx p bx) and Bridge of Nose(tx p bx)   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   Left Forehead, Left Neck, Right Nose, and Right Hand   Stroke ([facility]CC)    Tremor    Weakness 11/20/2020    Current Outpatient Medications:    amLODipine (NORVASC) 5 MG tablet, TAKE 1 AND 1/2 TABLETS BY MOUTH DAILY, Disp: 135 tablet, Rfl: 0   aspirin EC 81 MG tablet, Take 81 mg by mouth 3 (three) times a week., Disp: , Rfl:    atorvastatin (LIPITOR) 40 MG tablet, TAKE 1 TABLET BY MOUTH AT BEDTIME, Disp: 30 tablet, Rfl: 1   clobetasol cream (TEMOVATE) 0.05 %, APPLY TO THE AFFECTED AREA(S) TWICE DAILY UNTIL PSORIATIC LESION RESOLVED (Patient not taking: Reported on 02/26/2022), Disp: 120 g, Rfl: 2   lisinopril (ZESTRIL) 30 MG tablet, TAKE 1 TABLET BY MOUTH EVERY DAY (note DOSE CHANGE), Disp: 90 tablet, Rfl: 0   mirtazapine (REMERON) 7.5 MG tablet, TAKE 1 TABLET BY MOUTH AT BEDTIME, Disp: 90 tablet, Rfl: 0   polyethylene  glycol (MIRALAX / GLYCOLAX) 17 g packet, Take 17 g by mouth daily., Disp: , Rfl:    primidone (MYSOLINE) 50 MG tablet, TAKE 1 AND 1/2 TABLETS BY MOUTH THREE TIMES DAILY, Disp: 405 tablet, Rfl: 2   warfarin (COUMADIN) 5 MG tablet, TAKE 1 AND 1/2 TABLETS BY MOUTH DAILY AT 4pm EXCEPT ON MONDAYS, TUESDAYS, AND SATURDAYS TAKE 1 TABLET DAILY. (Patient taking differently: Takes 1 and 1/2 tablet every day except 1 tablet on Saturday), Disp: 135 tablet, Rfl: 3 Social History   Socioeconomic History   Marital status: Widowed    Spouse name: Not on file   Number of children: 3   Years of education: Not on file   Highest education level: Not on file  Occupational History   Occupation: Retired  Tobacco Use    Smoking status: Never   Smokeless tobacco: Never  Vaping Use   Vaping Use: Never used  Substance and Sexual Activity   Alcohol use: Never   Drug use: Never   Sexual activity: Not Currently  Other Topics Concern   Not on file  Social History Narrative   Not on file   Social Determinants of Health   Financial Resource Strain: Not on file  Food Insecurity: Not on file  Transportation Needs: Not on file  Physical Activity: Not on file  Stress: Not on file  Social Connections: Not on file  Intimate Partner Violence: Not on file   Family History  Problem Relation Age of Onset   Dementia Mother    Stroke Father    Heart disease Father    Diabetes Sister    Stroke Son     Objective: Office vital signs reviewed. BP 122/76   Pulse 60   Temp 98.6 F (37 C)   Ht '5\' 5"'$  (1.651 m)   Wt 136 lb (61.7 kg)   SpO2 98%   BMI 22.63 kg/m   Physical Examination:  General: Awake, alert, nontoxic elderly female.  No acute distress HEENT: sclera white, MMM Cardio: regular rate and rhythm, S1S2 heard, no murmurs appreciated Pulm: clear to auscultation bilaterally, no wheezes, rhonchi or rales; normal work of breathing on room air Skin: Senile purpura noted throughout bilateral upper extremity  Assessment/ Plan: 89 y.o. 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  No changes.  Follow-up in 2 months  Reinforced need for adequate hydration.  Utilize cane for ambulation to reduce fall risk.  No orders of the defined types were placed in this encounter.  No orders of the defined types were placed in this encounter.    [name], DO [place] [phone]

## 2022-03-13 ENCOUNTER — Ambulatory Visit (INDEPENDENT_AMBULATORY_CARE_PROVIDER_SITE_OTHER): Payer: Medicare Other | Admitting: Family Medicine

## 2022-03-13 ENCOUNTER — Encounter: Payer: Self-pay | Admitting: Family Medicine

## 2022-03-13 VITALS — BP 135/88 | HR 77 | Temp 97.2°F | Ht 65.0 in | Wt 137.4 lb

## 2022-03-13 DIAGNOSIS — L03116 Cellulitis of left lower limb: Secondary | ICD-10-CM

## 2022-03-13 DIAGNOSIS — L0390 Cellulitis, unspecified: Secondary | ICD-10-CM

## 2022-03-13 MED ORDER — DOXYCYCLINE HYCLATE 100 MG PO TABS: 100.0000 mg | ORAL_TABLET | Freq: Two times a day (BID) | ORAL | 0 refills | Status: DC

## 2022-03-13 NOTE — Progress Notes (Signed)
   Acute Office Visit  Subjective:     Patient ID: Dominique Farley, female    DOB: [birth year], 89 y.o.   MRN: [id number]  Chief Complaint  Patient presents with   skin tear    HPI Patient is in today for a skin tear that occurred 4-5 days ago. She scraped her leg along the recliner. She   ROS      Objective:    BP 135/88   Pulse 77   Temp (!) 97.2 F (36.2 C) (Temporal)   Ht '5\' 5"'$  (1.651 m)   Wt 137 lb 6 oz (62.3 kg)   SpO2 98%   BMI 22.86 kg/m    Physical Exam Vitals and nursing note reviewed.  Constitutional:      General: She is not in acute distress.    Appearance: She is not ill-appearing, toxic-appearing or diaphoretic.  Pulmonary:     Effort: Pulmonary effort is normal. No respiratory distress.  Skin:    General: Skin is warm and dry.     Comments: 9 cm laceration to left lower leg on medial aspect with surround erythema, warmth, and tenderness. No exudate.   Neurological:     Mental Status: She is alert and oriented to person, place, and time. Mental status is at baseline.  Psychiatric:        Mood and Affect: Mood normal.        Behavior: Behavior normal.           Assessment & Plan:   Dominique Farley was seen today for skin tear.  Diagnoses and all orders for this visit:  Wound cellulitis Wound cleansed and Duoderm applied today. Doxycyline as below for cellulitis. Follow up early next week, sooner for worsening symptoms. May need INR at follow up visit given abx use with coumadin.  -     doxycycline (VIBRA-TABS) 100 MG tablet; Take 1 tablet (100 mg total) by mouth 2 (two) times daily for 7 days. 1 po bid  The patient indicates understanding of these issues and agrees with the plan.  Gwenlyn Perking, FNP

## 2022-03-17 ENCOUNTER — Ambulatory Visit (INDEPENDENT_AMBULATORY_CARE_PROVIDER_SITE_OTHER): Payer: Medicare Other | Admitting: Family Medicine

## 2022-03-17 ENCOUNTER — Encounter: Payer: Self-pay | Admitting: Family Medicine

## 2022-03-17 VITALS — BP 116/82 | HR 82 | Temp 98.2°F | Ht 65.0 in | Wt 137.0 lb

## 2022-03-17 DIAGNOSIS — L03116 Cellulitis of left lower limb: Secondary | ICD-10-CM | POA: Diagnosis not present

## 2022-03-17 DIAGNOSIS — Z7901 Long term (current) use of anticoagulants: Secondary | ICD-10-CM

## 2022-03-17 DIAGNOSIS — S8992XA Unspecified injury of left lower leg, initial encounter: Secondary | ICD-10-CM | POA: Diagnosis not present

## 2022-03-17 DIAGNOSIS — I4811 Longstanding persistent atrial fibrillation: Secondary | ICD-10-CM

## 2022-03-17 DIAGNOSIS — T148XXA Other injury of unspecified body region, initial encounter: Secondary | ICD-10-CM | POA: Diagnosis not present

## 2022-03-17 DIAGNOSIS — Z5181 Encounter for therapeutic drug level monitoring: Secondary | ICD-10-CM | POA: Diagnosis not present

## 2022-03-17 LAB — COAGUCHEK XS/INR WAIVED
INR: 2.3 — ABNORMAL HIGH (ref 0.9–1.1)
Prothrombin Time: 28 s

## 2022-03-17 LAB — POCT INR: INR: 2.3 (ref 2–3)

## 2022-03-17 MED ORDER — CEFTRIAXONE SODIUM 1 G IJ SOLR: 1.0000 g | Freq: Once | INTRAMUSCULAR | Status: DC

## 2022-03-17 MED ORDER — CEPHALEXIN 500 MG PO CAPS: 500.0000 mg | ORAL_CAPSULE | Freq: Four times a day (QID) | ORAL | 0 refills | Status: AC

## 2022-03-17 NOTE — Progress Notes (Signed)
Subjective: [id number] wound PCP: [name], DO [id number] Dominique Farley is a 89 y.o. female presenting to clinic today for:  1. Wound cellulitis Patient with LLE wound and cellulitis.  She apparently hit the leg against a recliner and tore the skin.  Seen 3/1 and placed on Doxy.  Has been using the dressing that was provided and changing as directed.  Leg is redder and more swollen today.  She is compliant with coumadin for Afib. No overt bleeding by scant blood on bandage today, which was changed yesterday.  No fevers, change in mentation, nausea, vomiting.   ROS: Per HPI  Allergies  Allergen Reactions   Codeine Other (See Comments)   Influenza Vaccines    Remeron [Mirtazapine] Other (See Comments)    Dizziness    Sulfa Antibiotics Hives   Past Medical History:  Diagnosis Date   Acute cystitis with hematuria 11/22/2020   Atrial fibrillation (HCC)    BCC (basal cell carcinoma of skin) 11/14/2004   Right Mid Cheek(Cx3,5FU)   BCC (basal cell carcinoma of skin) 04/16/2011   Right Crease(tx p bx)   BCC (basal cell carcinoma of skin) 06/16/2016   Left Nare(tx p bx)   BCC (basal cell carcinoma of skin) Atypical Basaloid 09/29/2018   Left Side Nose(tx p bx)   Hypertension    Osteoporosis    Pneumonia of lower lobe due to infectious organism 11/20/2020   SCC (squamous cell carcinoma) 09/29/2018   Left Jawline(tx p bx)   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   Right Jawline(Cx3,5FU) and Left Upper Cheek(Cx3,5FU)   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   Left Jawline(tx p bx), Nose(tx p bx), Right Cheek(tx p bx), and Right Forearm(tx p bx)   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   Right Temple(Cx3,5FU) and Left Outer Eye(Cx3,5FU)   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   Right Forearm(Cx3,5FU)   Squamous cell carcinoma in situ (SCCIS) 03/13/2005   Left Side of Nose(Cx3,5FU)   Squamous cell carcinoma in situ (SCCIS) 06/04/2005    Inner Right Cheek/Eye(Tx p Bx)   Squamous cell carcinoma in situ (SCCIS) 02/12/2010   Right Cheek(tx p bx)   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 2 09/29/2018   Upper Lip(tx p bx) and Bridge of Nose(tx p bx)   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 Start 03/18/2022, Disp: 40 capsule, Rfl: 0   clobetasol cream (TEMOVATE) 0.05 %, APPLY TO THE AFFECTED AREA(S) TWICE DAILY UNTIL PSORIATIC LESION RESOLVED, Disp: 120 g, Rfl: 2   lisinopril (ZESTRIL) 30 MG tablet, TAKE 1 TABLET BY MOUTH EVERY DAY (note DOSE CHANGE), Disp: 90 tablet, Rfl: 0   polyethylene glycol (MIRALAX / GLYCOLAX) 17 g packet, Take 17 g by mouth daily., Disp: , Rfl:    primidone (MYSOLINE) 50  MG tablet, TAKE 1 AND 1/2 TABLETS BY MOUTH THREE TIMES DAILY, Disp: 405 tablet, Rfl: 2   warfarin (COUMADIN) 5 MG tablet, TAKE 1 AND 1/2 TABLETS BY MOUTH DAILY AT 4pm EXCEPT ON MONDAYS, TUESDAYS, AND SATURDAYS TAKE 1 TABLET DAILY. (Patient taking differently: Takes 1 and 1/2 tablet every day except 1 tablet on Saturday), Disp: 135 tablet, Rfl: 3  Current Facility-Administered Medications:    cefTRIAXone (ROCEPHIN) injection 1 g, 1 g, Intramuscular, Once, [name], DO Social History   Socioeconomic History   Marital status: Widowed    Spouse name: Not on file   Number of children: 3   Years of education: Not on file   Highest education level: Not on file  Occupational History    Occupation: Retired  Tobacco Use   Smoking status: Never   Smokeless tobacco: Never  Vaping Use   Vaping Use: Never used  Substance and Sexual Activity   Alcohol use: Never   Drug use: Never   Sexual activity: Not Currently  Other Topics Concern   Not on file  Social History Narrative   Not on file   Social Determinants of Health   Financial Resource Strain: Not on file  Food Insecurity: Not on file  Transportation Needs: Not on file  Physical Activity: Not on file  Stress: Not on file  Social Connections: Not on file  Intimate Partner Violence: Not on file   Family History  Problem Relation Age of Onset   Dementia Mother    Stroke Father    Heart disease Father    Diabetes Sister    Stroke Son     Objective: Office vital signs reviewed. BP 116/82   Pulse 82   Temp 98.2 F (36.8 C)   Ht '5\' 5"'$  (1.651 m)   Wt 137 lb (62.1 kg)   BMI 22.80 kg/m   Physical Examination:  General: Awake, alert, thin, frail appearing female, No acute distress HEENT: sclera white, no conjunctival pallor Extremities: LLE with erythema, induration and a very large hemostatic skin tear with possible eschar vs hematoma. No purulence. Serosanginous discharge on bandage MSK: ambulates with assistance  Assessment/ Plan: 89 y.o. female   Left leg cellulitis - Plan: cefTRIAXone (ROCEPHIN) injection 1 g, cephALEXin (KEFLEX) 500 MG capsule, AMB referral to wound care center  Nonhealing nonsurgical wound - Plan: AMB referral to wound care center, CBC with Differential  Anticoagulation goal of INR 2 to 3 - Plan: CoaguChek XS/INR Waived  Rocephin.  Start Keflex QID tomorrow.  Xeroform bandages changed daily.  STAT referral to wound care.  Close follow up in 1 week, sooner if worsening or develops systemic symptoms.  INR was rechecked today given use of Doxycycline.  It was in range at 2.3 NO changes to regimen.  Will repeat again in 1 week.   Orders Placed This Encounter  Procedures    CoaguChek XS/INR Waived   CBC with Differential   AMB referral to wound care center    Referral Priority:   Urgent    Referral Type:   Consultation    Number of Visits Requested:   1   Meds ordered this encounter  Medications   cefTRIAXone (ROCEPHIN) injection 1 g   cephALEXin (KEFLEX) 500 MG capsule    Sig: Take 1 capsule (500 mg total) by mouth 4 (four) times daily for 10 days. Start 03/18/2022    Dispense:  40 capsule    Refill:  0     Park Beck Windell Moulding,  DO [place] [phone]

## 2022-03-17 NOTE — Patient Instructions (Signed)
STOP Doxy START Cephalexin tomorrow 03/18/22 Rocephin given today Do wound care/ bandage changes as directed. Monitor for worsening. Call if fevers, redness is spreading, altered mentation Wound care referral in place. You should receive a call about this

## 2022-03-18 ENCOUNTER — Telehealth: Payer: Self-pay | Admitting: Family Medicine

## 2022-03-18 LAB — CBC WITH DIFFERENTIAL/PLATELET
Basophils Absolute: 0 10*3/uL (ref 0.0–0.2)
Basos: 0 %
EOS (ABSOLUTE): 0.1 10*3/uL (ref 0.0–0.4)
Eos: 1 %
Hematocrit: 36.2 % (ref 34.0–46.6)
Hemoglobin: 12.2 g/dL (ref 11.1–15.9)
Immature Grans (Abs): 0 10*3/uL (ref 0.0–0.1)
Immature Granulocytes: 0 %
Lymphocytes Absolute: 1.7 10*3/uL (ref 0.7–3.1)
Lymphs: 17 %
MCH: 32.8 pg (ref 26.6–33.0)
MCHC: 33.7 g/dL (ref 31.5–35.7)
MCV: 97 fL (ref 79–97)
Monocytes Absolute: 1.1 10*3/uL — ABNORMAL HIGH (ref 0.1–0.9)
Monocytes: 10 %
Neutrophils Absolute: 7.5 10*3/uL — ABNORMAL HIGH (ref 1.4–7.0)
Neutrophils: 72 %
Platelets: 414 10*3/uL (ref 150–450)
RBC: 3.72 x10E6/uL — ABNORMAL LOW (ref 3.77–5.28)
RDW: 13.1 % (ref 11.7–15.4)
WBC: 10.4 10*3/uL (ref 3.4–10.8)

## 2022-03-19 ENCOUNTER — Telehealth: Payer: Self-pay | Admitting: Family Medicine

## 2022-03-24 ENCOUNTER — Ambulatory Visit: Payer: Medicare Other

## 2022-03-24 ENCOUNTER — Telehealth: Payer: Self-pay | Admitting: Family Medicine

## 2022-03-24 ENCOUNTER — Other Ambulatory Visit: Payer: Self-pay | Admitting: Family Medicine

## 2022-03-24 DIAGNOSIS — Z885 Allergy status to narcotic agent status: Secondary | ICD-10-CM | POA: Diagnosis not present

## 2022-03-24 DIAGNOSIS — L97919 Non-pressure chronic ulcer of unspecified part of right lower leg with unspecified severity: Secondary | ICD-10-CM | POA: Diagnosis not present

## 2022-03-24 DIAGNOSIS — S81802D Unspecified open wound, left lower leg, subsequent encounter: Secondary | ICD-10-CM | POA: Diagnosis not present

## 2022-03-24 DIAGNOSIS — L97929 Non-pressure chronic ulcer of unspecified part of left lower leg with unspecified severity: Secondary | ICD-10-CM | POA: Diagnosis not present

## 2022-03-24 DIAGNOSIS — Z888 Allergy status to other drugs, medicaments and biological substances status: Secondary | ICD-10-CM | POA: Diagnosis not present

## 2022-03-24 DIAGNOSIS — T1490XA Injury, unspecified, initial encounter: Secondary | ICD-10-CM | POA: Diagnosis not present

## 2022-03-24 DIAGNOSIS — Z79899 Other long term (current) drug therapy: Secondary | ICD-10-CM | POA: Diagnosis not present

## 2022-03-24 DIAGNOSIS — I63512 Cerebral infarction due to unspecified occlusion or stenosis of left middle cerebral artery: Secondary | ICD-10-CM

## 2022-03-24 DIAGNOSIS — M79605 Pain in left leg: Secondary | ICD-10-CM | POA: Diagnosis not present

## 2022-03-24 DIAGNOSIS — Z8673 Personal history of transient ischemic attack (TIA), and cerebral infarction without residual deficits: Secondary | ICD-10-CM | POA: Diagnosis not present

## 2022-03-24 DIAGNOSIS — Z887 Allergy status to serum and vaccine status: Secondary | ICD-10-CM | POA: Diagnosis not present

## 2022-03-24 DIAGNOSIS — I878 Other specified disorders of veins: Secondary | ICD-10-CM | POA: Diagnosis not present

## 2022-03-24 DIAGNOSIS — Z882 Allergy status to sulfonamides status: Secondary | ICD-10-CM | POA: Diagnosis not present

## 2022-03-24 DIAGNOSIS — M7989 Other specified soft tissue disorders: Secondary | ICD-10-CM | POA: Diagnosis not present

## 2022-03-24 DIAGNOSIS — M810 Age-related osteoporosis without current pathological fracture: Secondary | ICD-10-CM | POA: Diagnosis not present

## 2022-03-24 DIAGNOSIS — M79604 Pain in right leg: Secondary | ICD-10-CM | POA: Diagnosis not present

## 2022-03-24 DIAGNOSIS — Z85828 Personal history of other malignant neoplasm of skin: Secondary | ICD-10-CM | POA: Diagnosis not present

## 2022-03-24 DIAGNOSIS — I872 Venous insufficiency (chronic) (peripheral): Secondary | ICD-10-CM | POA: Diagnosis not present

## 2022-03-24 DIAGNOSIS — E785 Hyperlipidemia, unspecified: Secondary | ICD-10-CM | POA: Diagnosis not present

## 2022-03-24 DIAGNOSIS — I10 Essential (primary) hypertension: Secondary | ICD-10-CM | POA: Diagnosis not present

## 2022-03-24 DIAGNOSIS — I83019 Varicose veins of right lower extremity with ulcer of unspecified site: Secondary | ICD-10-CM | POA: Insufficient documentation

## 2022-03-24 DIAGNOSIS — I4891 Unspecified atrial fibrillation: Secondary | ICD-10-CM | POA: Diagnosis not present

## 2022-03-24 DIAGNOSIS — L03116 Cellulitis of left lower limb: Secondary | ICD-10-CM | POA: Diagnosis not present

## 2022-03-24 DIAGNOSIS — E119 Type 2 diabetes mellitus without complications: Secondary | ICD-10-CM | POA: Diagnosis not present

## 2022-03-24 NOTE — Telephone Encounter (Signed)
Wound care center calling to let me know that Dominique Farley was not able to tolerate bedside debridement of her wound.  They plan on bring her into the surgical suite on 04/02/2022.  They have advised her to take her last Coumadin tablet this Friday.  I would like to see her back 2 to 3 days after she has discontinued Coumadin as she will need to have a Lovenox bridge given her high risk of clotting.  She has history of CVA and persistent atrial fibrillation.  She is expected to go into surgery sometime around noon, so my plan is to have her take her last dose of Lovenox the day prior.

## 2022-03-27 DIAGNOSIS — S81802D Unspecified open wound, left lower leg, subsequent encounter: Secondary | ICD-10-CM | POA: Diagnosis not present

## 2022-03-31 ENCOUNTER — Ambulatory Visit: Payer: Medicare Other | Admitting: Family Medicine

## 2022-03-31 DIAGNOSIS — L97929 Non-pressure chronic ulcer of unspecified part of left lower leg with unspecified severity: Secondary | ICD-10-CM | POA: Diagnosis not present

## 2022-03-31 DIAGNOSIS — S81802D Unspecified open wound, left lower leg, subsequent encounter: Secondary | ICD-10-CM | POA: Diagnosis not present

## 2022-04-01 ENCOUNTER — Telehealth: Payer: Self-pay

## 2022-04-01 NOTE — Telephone Encounter (Signed)
Please call Roderic Palau at [facility], [phone], extension [phone].  Patient it scheduled for surgery tomorrow at 12:45 pm.  According to her daughter she stopped Coumadin last Friday, 03/27/22.  Note in chart states she was to come back here to discuss Lovenox bridge when she stopped Coumadin but daughter states she has not done so.  What should they do?  Do they need to postpone surgery?

## 2022-04-01 NOTE — Telephone Encounter (Signed)
She was supposed to have the Lovenox to bridge so VERY unfortunate that she has not done this.  She can go through with surgery but basically since they never came back in for INR check and no lovenox started, she risks complications from blood clots/ strokes etc.

## 2022-04-02 ENCOUNTER — Telehealth: Payer: Self-pay | Admitting: Family Medicine

## 2022-04-02 DIAGNOSIS — Z79899 Other long term (current) drug therapy: Secondary | ICD-10-CM | POA: Diagnosis not present

## 2022-04-02 DIAGNOSIS — L97921 Non-pressure chronic ulcer of unspecified part of left lower leg limited to breakdown of skin: Secondary | ICD-10-CM | POA: Diagnosis not present

## 2022-04-02 DIAGNOSIS — L97929 Non-pressure chronic ulcer of unspecified part of left lower leg with unspecified severity: Secondary | ICD-10-CM | POA: Diagnosis not present

## 2022-04-02 DIAGNOSIS — I10 Essential (primary) hypertension: Secondary | ICD-10-CM | POA: Diagnosis not present

## 2022-04-02 DIAGNOSIS — I878 Other specified disorders of veins: Secondary | ICD-10-CM | POA: Diagnosis not present

## 2022-04-02 DIAGNOSIS — E11622 Type 2 diabetes mellitus with other skin ulcer: Secondary | ICD-10-CM | POA: Diagnosis not present

## 2022-04-02 DIAGNOSIS — I4891 Unspecified atrial fibrillation: Secondary | ICD-10-CM | POA: Diagnosis not present

## 2022-04-02 DIAGNOSIS — Z882 Allergy status to sulfonamides status: Secondary | ICD-10-CM | POA: Diagnosis not present

## 2022-04-02 DIAGNOSIS — Z8673 Personal history of transient ischemic attack (TIA), and cerebral infarction without residual deficits: Secondary | ICD-10-CM | POA: Diagnosis not present

## 2022-04-02 NOTE — Telephone Encounter (Signed)
[facility] patient...Marland KitchenMarland KitchenMarland Kitchen     Patient is having surgery and I spoke with Maudie Mercury, the care coordinator for this patient.  She said Ms Arceo will be resuming her Coumadin tomorrow and she would like to know when you want her to follow up with [place] to have PT/INR checked.  Please advise.

## 2022-04-02 NOTE — Telephone Encounter (Signed)
Kim at [facility] informed that patient needs to follow up and check PT/INR in one week.  Appointment scheduled with Alvie Heidelberg on 04/09/22.

## 2022-04-09 ENCOUNTER — Ambulatory Visit (INDEPENDENT_AMBULATORY_CARE_PROVIDER_SITE_OTHER): Payer: Medicare Other | Admitting: Family Medicine

## 2022-04-09 ENCOUNTER — Encounter: Payer: Self-pay | Admitting: Family Medicine

## 2022-04-09 VITALS — BP 130/60 | HR 64 | Temp 98.3°F | Ht 65.0 in | Wt 137.0 lb

## 2022-04-09 DIAGNOSIS — I4811 Longstanding persistent atrial fibrillation: Secondary | ICD-10-CM | POA: Diagnosis not present

## 2022-04-09 DIAGNOSIS — Z7901 Long term (current) use of anticoagulants: Secondary | ICD-10-CM

## 2022-04-09 DIAGNOSIS — I6930 Unspecified sequelae of cerebral infarction: Secondary | ICD-10-CM

## 2022-04-09 DIAGNOSIS — Z5181 Encounter for therapeutic drug level monitoring: Secondary | ICD-10-CM

## 2022-04-09 DIAGNOSIS — I63512 Cerebral infarction due to unspecified occlusion or stenosis of left middle cerebral artery: Secondary | ICD-10-CM

## 2022-04-09 LAB — POCT INR: INR: 1.9 — AB (ref 2.0–3.0)

## 2022-04-09 LAB — COAGUCHEK XS/INR WAIVED
INR: 1.9 — ABNORMAL HIGH (ref 0.9–1.1)
Prothrombin Time: 22.9 s

## 2022-04-09 NOTE — Progress Notes (Signed)
   Acute Office Visit  Subjective:  Patient ID: Dominique Farley, female    DOB: 03/15/1926, 89 y.o.   MRN: [id number]  Chief Complaint  Patient presents with   Acute Visit   HPI Patient is in today for INR check for history of Afib.  Goal A1C is 2-3  Patient had debridement of wound on 04/02/22 and per notes held coumadin from 03/27/22 until debridement. Restarted coumadin on Friday after surgery 04/03/22. Follows with Dr. Ladona Horns, tomorrow 04/10/22.   ROS As per HPI   Objective:  Ht 5\' 5"  (1.651 m)   BMI 22.80 kg/m     04/09/2022    3:21 PM 04/09/2022    2:53 PM 03/17/2022    2:57 PM  Vitals with BMI  Height  5\' 5"  5\' 5"   Weight  137 lbs 137 lbs  BMI  [id number]  Systolic [id number]  [id number]  Diastolic 60  82  Pulse  64 82    Physical Exam Constitutional:      General: She is not in acute distress.    Appearance: Normal appearance. She is not ill-appearing, toxic-appearing or diaphoretic.  Cardiovascular:     Rate and Rhythm: Normal rate. Rhythm irregular.     Pulses: Normal pulses.     Heart sounds: Normal heart sounds. No murmur heard.    No gallop.  Pulmonary:     Effort: Pulmonary effort is normal. No respiratory distress.     Breath sounds: Normal breath sounds. No stridor. No wheezing, rhonchi or rales.  Skin:    General: Skin is dry.     Capillary Refill: Capillary refill takes less than 2 seconds.     Findings: Bruising present.     Comments: Wrapped wound on left lower extremity   Neurological:     General: No focal deficit present.     Mental Status: She is alert and oriented to person, place, and time. Mental status is at baseline.     Motor: No weakness.  Psychiatric:        Mood and Affect: Mood normal.        Behavior: Behavior normal.        Thought Content: Thought content normal.        Judgment: Judgment normal.    Assessment & Plan:  1. Longstanding persistent atrial fibrillation ([place]) 2. Encounter for monitoring Coumadin therapy Continue current therapy.  Just slightly below goal. Will not change therapy at this time given that patient held dose for ~1 week for debridement. Instructed pt to follow up in 1 week for recheck.  - CoaguChek XS/INR Waived - POCT INR  3. Cerebrovascular accident (CVA) due to occlusion of left middle cerebral artery ([place])  The above assessment and management plan was discussed with the patient. The patient verbalized understanding of and has agreed to the management plan using shared-decision making. Patient is aware to call the clinic if they develop any new symptoms or if symptoms fail to improve or worsen. Patient is aware when to return to the clinic for a follow-up visit. Patient educated on when it is appropriate to go to the emergency department.   Donzetta Kohut, DNP-FNP [facility] [address] [place],  [address] [phone]

## 2022-04-10 DIAGNOSIS — L97929 Non-pressure chronic ulcer of unspecified part of left lower leg with unspecified severity: Secondary | ICD-10-CM | POA: Diagnosis not present

## 2022-04-10 DIAGNOSIS — L97919 Non-pressure chronic ulcer of unspecified part of right lower leg with unspecified severity: Secondary | ICD-10-CM | POA: Diagnosis not present

## 2022-04-10 DIAGNOSIS — S81802D Unspecified open wound, left lower leg, subsequent encounter: Secondary | ICD-10-CM | POA: Diagnosis not present

## 2022-04-10 DIAGNOSIS — I83029 Varicose veins of left lower extremity with ulcer of unspecified site: Secondary | ICD-10-CM | POA: Diagnosis not present

## 2022-04-10 DIAGNOSIS — I83019 Varicose veins of right lower extremity with ulcer of unspecified site: Secondary | ICD-10-CM | POA: Diagnosis not present

## 2022-04-16 ENCOUNTER — Telehealth: Payer: Self-pay | Admitting: Family Medicine

## 2022-04-17 DIAGNOSIS — I878 Other specified disorders of veins: Secondary | ICD-10-CM | POA: Diagnosis not present

## 2022-04-17 DIAGNOSIS — Z8673 Personal history of transient ischemic attack (TIA), and cerebral infarction without residual deficits: Secondary | ICD-10-CM | POA: Diagnosis not present

## 2022-04-17 DIAGNOSIS — Z882 Allergy status to sulfonamides status: Secondary | ICD-10-CM | POA: Diagnosis not present

## 2022-04-17 DIAGNOSIS — Z885 Allergy status to narcotic agent status: Secondary | ICD-10-CM | POA: Diagnosis not present

## 2022-04-17 DIAGNOSIS — Z85828 Personal history of other malignant neoplasm of skin: Secondary | ICD-10-CM | POA: Diagnosis not present

## 2022-04-17 DIAGNOSIS — L97929 Non-pressure chronic ulcer of unspecified part of left lower leg with unspecified severity: Secondary | ICD-10-CM | POA: Diagnosis not present

## 2022-04-17 DIAGNOSIS — Z79899 Other long term (current) drug therapy: Secondary | ICD-10-CM | POA: Diagnosis not present

## 2022-04-17 DIAGNOSIS — S51012A Laceration without foreign body of left elbow, initial encounter: Secondary | ICD-10-CM | POA: Diagnosis not present

## 2022-04-17 DIAGNOSIS — I10 Essential (primary) hypertension: Secondary | ICD-10-CM | POA: Diagnosis not present

## 2022-04-17 DIAGNOSIS — E785 Hyperlipidemia, unspecified: Secondary | ICD-10-CM | POA: Diagnosis not present

## 2022-04-17 DIAGNOSIS — Z887 Allergy status to serum and vaccine status: Secondary | ICD-10-CM | POA: Diagnosis not present

## 2022-04-17 DIAGNOSIS — E119 Type 2 diabetes mellitus without complications: Secondary | ICD-10-CM | POA: Diagnosis not present

## 2022-04-17 DIAGNOSIS — I83029 Varicose veins of left lower extremity with ulcer of unspecified site: Secondary | ICD-10-CM | POA: Diagnosis not present

## 2022-04-17 DIAGNOSIS — I4891 Unspecified atrial fibrillation: Secondary | ICD-10-CM | POA: Diagnosis not present

## 2022-04-17 DIAGNOSIS — Z888 Allergy status to other drugs, medicaments and biological substances status: Secondary | ICD-10-CM | POA: Diagnosis not present

## 2022-04-17 DIAGNOSIS — M810 Age-related osteoporosis without current pathological fracture: Secondary | ICD-10-CM | POA: Diagnosis not present

## 2022-04-17 DIAGNOSIS — S81802D Unspecified open wound, left lower leg, subsequent encounter: Secondary | ICD-10-CM | POA: Diagnosis not present

## 2022-04-17 DIAGNOSIS — I872 Venous insufficiency (chronic) (peripheral): Secondary | ICD-10-CM | POA: Diagnosis not present

## 2022-04-21 ENCOUNTER — Encounter (HOSPITAL_BASED_OUTPATIENT_CLINIC_OR_DEPARTMENT_OTHER): Payer: Medicare Other | Admitting: General Surgery

## 2022-04-22 ENCOUNTER — Ambulatory Visit (INDEPENDENT_AMBULATORY_CARE_PROVIDER_SITE_OTHER): Payer: Medicare Other | Admitting: Family Medicine

## 2022-04-22 ENCOUNTER — Other Ambulatory Visit: Payer: Self-pay | Admitting: Family Medicine

## 2022-04-22 ENCOUNTER — Encounter: Payer: Self-pay | Admitting: Family Medicine

## 2022-04-22 VITALS — BP 136/78 | HR 86 | Temp 98.7°F | Ht 65.0 in

## 2022-04-22 DIAGNOSIS — I4811 Longstanding persistent atrial fibrillation: Secondary | ICD-10-CM | POA: Diagnosis not present

## 2022-04-22 DIAGNOSIS — L089 Local infection of the skin and subcutaneous tissue, unspecified: Secondary | ICD-10-CM

## 2022-04-22 DIAGNOSIS — I63512 Cerebral infarction due to unspecified occlusion or stenosis of left middle cerebral artery: Secondary | ICD-10-CM

## 2022-04-22 DIAGNOSIS — Z7901 Long term (current) use of anticoagulants: Secondary | ICD-10-CM | POA: Diagnosis not present

## 2022-04-22 DIAGNOSIS — Z5181 Encounter for therapeutic drug level monitoring: Secondary | ICD-10-CM | POA: Diagnosis not present

## 2022-04-22 DIAGNOSIS — G250 Essential tremor: Secondary | ICD-10-CM

## 2022-04-22 LAB — COAGUCHEK XS/INR WAIVED
INR: 2.3 — ABNORMAL HIGH (ref 0.9–1.1)
Prothrombin Time: 28.2 s

## 2022-04-22 MED ORDER — MUPIROCIN 2 % EX OINT: 1.0000 | TOPICAL_OINTMENT | Freq: Two times a day (BID) | CUTANEOUS | 0 refills | Status: AC

## 2022-04-22 NOTE — Patient Instructions (Signed)
Skin Abscess  A skin abscess is an infected spot of skin. It can have pus in it. An abscess can happen in any part of your body. Some abscesses break open (rupture) on their own. Most keep getting worse unless they are treated. If your abscess is not treated, the infection can spread deeper into your body and blood. This can make you feel sick. What are the causes? Germs that enter your skin. This may happen if you have: A cut or scrape. A wound from a needle or an insect bite. Blocked oil or sweat glands. A problem with the spot where your hair goes into your skin. A fluid-filled sac called a cyst under your skin. What increases the risk? Having problems with how your blood moves through your body. Having a weak body defense system (immune system). Having diabetes. Having dry and irritated skin. Needing to get shots often. Putting drugs into your body with a needle. Having a splinter or something else in your skin. Smoking. What are the signs or symptoms? A firm bump under your skin that hurts. A bump with pus at the top. Redness and swelling. Warm or tender spots. A sore on the skin. How is this treated? You may need to: Put a heat pack or a warm, wet washcloth on the spot. Have the pus drained. Take antibiotics. Follow these instructions at home: Medicines Take over-the-counter and prescription medicines only as told by your doctor. If you were prescribed antibiotics, take them as told by your doctor. Do not stop taking them even if you start to feel better. Abscess care  If you have an abscess that has not drained, put heat on it. Use the heat source that your doctor recommends, such as a moist heat pack or a heating pad. Place a towel between your skin and the heat source. Leave the heat on for 20-30 minutes. If your skin turns bright red, take off the heat right away to prevent burns. The risk of burns is higher if you cannot feel pain, heat, or cold. Follow  instructions from your doctor about how to take care of your abscess. Make sure you: Cover the abscess with a bandage. Wash your hands with soap and water for at least 20 seconds before and after you change your bandage. If you cannot use soap and water, use hand sanitizer. Change your bandage as told by your doctor. Check your abscess every day for signs that the infection is getting worse. Check for: More redness, swelling, or pain. More fluid or blood. Warmth. More pus or a worse smell. General instructions To keep the infection from spreading: Do not share personal items or towels. Do not go in a hot tub with others. Avoid making skin contact with others. Be careful when you get rid of used bandages or any pus from the abscess. Do not smoke or use any products that contain nicotine or tobacco. If you need help quitting, ask your doctor. Contact a doctor if: You see red streaks on your skin near the abscess. You have any signs of worse infection. You vomit every time you eat or drink. You have a fever, chills, or muscle aches. The cyst or abscess comes back. Get help right away if: You have very bad pain. You make less pee (urine) than normal. This information is not intended to replace advice given to you by your health care provider. Make sure you discuss any questions you have with your health care provider. Document Revised: 08/13/2021   Document Reviewed: 08/13/2021 Elsevier Patient Education  2023 Elsevier Inc.  

## 2022-04-22 NOTE — Progress Notes (Signed)
Subjective: PF:XTKW PCP: [name], DO IOX:[id number] Dominique Farley is a 89 y.o. female presenting to clinic today for:  1. Afib Goal INR 2-3 Inr slightly subtherapeutic last week.  She is here for recheck.  She has had her left lower extremity debridement.  No bleeding from that lesion but they worry about some risk of developing infection in that area due to wound.  She notes a new wound on the right inner thigh that seems to be abscess in nature.  They put a bandage on the thigh in efforts to heal it.   ROS: Per HPI  Allergies  Allergen Reactions   Codeine Other (See Comments)   Influenza Vaccines    Remeron [Mirtazapine] Other (See Comments)    Dizziness    Sulfa Antibiotics Hives   Past Medical History:  Diagnosis Date   Acute cystitis with hematuria 11/22/2020   Atrial fibrillation (HCC)    BCC (basal cell carcinoma of skin) 11/14/2004   Right Mid Cheek(Cx3,5FU)   BCC (basal cell carcinoma of skin) 04/16/2011   Right Crease(tx p bx)   BCC (basal cell carcinoma of skin) 06/16/2016   Left Nare(tx p bx)   BCC (basal cell carcinoma of skin) Atypical Basaloid 09/29/2018   Left Side Nose(tx p bx)   Hypertension    Osteoporosis    Pneumonia of lower lobe due to infectious organism 11/20/2020   SCC (squamous cell carcinoma) 09/29/2018   Left Jawline(tx p bx)   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   Right Jawline(Cx3,5FU) and Left Upper Cheek(Cx3,5FU)   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   Left Jawline(tx p bx), Nose(tx p bx), Right Cheek(tx p bx), and Right Forearm(tx p bx)   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   Right Temple(Cx3,5FU) and Left Outer Eye(Cx3,5FU)   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   Right Forearm(Cx3,5FU)   Squamous cell carcinoma in situ (SCCIS) 03/13/2005   Left Side of Nose(Cx3,5FU)   Squamous cell carcinoma in situ (SCCIS) 06/04/2005   Inner Right Cheek/Eye(Tx p Bx)   Squamous cell carcinoma in  situ (SCCIS) 02/12/2010   Right Cheek(tx p bx)   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 2 09/29/2018   Upper Lip(tx p bx) and Bridge of Nose(tx p bx)   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   Left Forehead, Left Neck, Right Nose, and Right Hand   Stroke (HCC)    Tremor    Weakness 11/20/2020    Current Outpatient Medications:    amLODipine (NORVASC) 5 MG tablet, TAKE 1 AND 1/2 TABLETS BY MOUTH DAILY, Disp: 135 tablet, Rfl: 0   aspirin EC 81 MG tablet, Take 81 mg by mouth 3 (three) times a week., Disp: , Rfl:    atorvastatin (LIPITOR) 40 MG tablet, TAKE 1 TABLET BY MOUTH AT BEDTIME, Disp: 30 tablet, Rfl: 0   clobetasol cream (TEMOVATE) 0.05 %, APPLY TO THE AFFECTED AREA(S) TWICE DAILY UNTIL PSORIATIC LESION RESOLVED, Disp: 120 g, Rfl: 2   lisinopril (ZESTRIL) 30 MG tablet, TAKE 1 TABLET BY MOUTH EVERY DAY (note DOSE CHANGE), Disp: 90 tablet, Rfl: 0   polyethylene glycol (MIRALAX / GLYCOLAX) 17 g packet, Take 17 g by mouth daily., Disp: , Rfl:    primidone (MYSOLINE) 50 MG tablet, TAKE 1 AND 1/2 TABLETS BY MOUTH THREE TIMES DAILY, Disp: 405 tablet, Rfl: 0   warfarin (COUMADIN) 5 MG tablet, TAKE 1 AND 1/2 TABLETS BY MOUTH DAILY  AT 4pm EXCEPT ON MONDAYS, TUESDAYS, AND SATURDAYS TAKE 1 TABLET DAILY. (Patient taking differently: Takes 1 and 1/2 tablet every day except 1 tablet on Saturday), Disp: 135 tablet, Rfl: 3  Current Facility-Administered Medications:    cefTRIAXone (ROCEPHIN) injection 1 g, 1 g, Intramuscular, Once, [name], DO Social History   Socioeconomic History   Marital status: Widowed    Spouse name: Not on file   Number of children: 3   Years of education: Not on file   Highest education level: Not on file  Occupational History   Occupation: Retired  Tobacco Use   Smoking status: Never   Smokeless tobacco: Never  Vaping Use   Vaping Use: Never used  Substance and Sexual Activity   Alcohol use: Never   Drug use: Never   Sexual activity:  Not Currently  Other Topics Concern   Not on file  Social History Narrative   Not on file   Social Determinants of Health   Financial Resource Strain: Not on file  Food Insecurity: Not on file  Transportation Needs: Not on file  Physical Activity: Not on file  Stress: Not on file  Social Connections: Not on file  Intimate Partner Violence: Not on file   Family History  Problem Relation Age of Onset   Dementia Mother    Stroke Father    Heart disease Father    Diabetes Sister    Stroke Son     Objective: Office vital signs reviewed. BP 136/78   Pulse 86   Temp 98.7 F (37.1 C)   Ht 5\' 5"  (1.651 m)   SpO2 98%   BMI 22.80 kg/m   Physical Examination:  General: Awake, alert, well-appearing elderly female, No acute distress HEENT: sclera white, MMM Cardio: regular rate  Skin: Several purpura.  She has left lower extremity wrapped in bandage.  Right inner thigh with kidney bean sized area of induration.  There is a central punctum to suggest abscess but there is no palpable fluctuance.  Slight erythema and warmth noted that does not extend beyond the borders of the kidney bean sized area  Assessment/ Plan: 89 y.o. female   Longstanding persistent atrial fibrillation - Plan: CoaguChek XS/INR Waived  Encounter for monitoring Coumadin therapy - Plan: CoaguChek XS/INR Waived  Anticoagulation goal of INR 2 to 3 - Plan: CoaguChek XS/INR Waived  Soft tissue infection - Plan: mupirocin ointment (BACTROBAN) 2 %  INR therapeutic at 2.2.  No changes.  I think that the lesion of concern is likely a small abscess.  As there was no fluctuance I did not perform I&D but I did recommend warm compresses to this area to promote any drainage that may be present.  I have also placed her on a topical antibiotic to cover for staph.  I would like her to contact me if anything is getting worse.  She will be reevaluated by wound care on Friday and hopefully they can repeat at this lesion to  make sure that nothing looks concerning and would be needing an oral antibiotic.  If she does require an oral antibiotic, we will plan for sooner INR recheck than her typical 6 to 8-week follow-up.  Both she and her daughter voiced good understanding and will follow-up as directed  No orders of the defined types were placed in this encounter.  No orders of the defined types were placed in this encounter.    [name], DO [facility] [phone]

## 2022-04-24 DIAGNOSIS — L97929 Non-pressure chronic ulcer of unspecified part of left lower leg with unspecified severity: Secondary | ICD-10-CM | POA: Diagnosis not present

## 2022-04-24 DIAGNOSIS — Z79899 Other long term (current) drug therapy: Secondary | ICD-10-CM | POA: Diagnosis not present

## 2022-04-24 DIAGNOSIS — S51002A Unspecified open wound of left elbow, initial encounter: Secondary | ICD-10-CM | POA: Diagnosis not present

## 2022-04-24 DIAGNOSIS — I83029 Varicose veins of left lower extremity with ulcer of unspecified site: Secondary | ICD-10-CM | POA: Diagnosis not present

## 2022-04-24 DIAGNOSIS — Z882 Allergy status to sulfonamides status: Secondary | ICD-10-CM | POA: Diagnosis not present

## 2022-04-24 DIAGNOSIS — E785 Hyperlipidemia, unspecified: Secondary | ICD-10-CM | POA: Diagnosis not present

## 2022-04-24 DIAGNOSIS — Z885 Allergy status to narcotic agent status: Secondary | ICD-10-CM | POA: Diagnosis not present

## 2022-04-24 DIAGNOSIS — I10 Essential (primary) hypertension: Secondary | ICD-10-CM | POA: Diagnosis not present

## 2022-04-24 DIAGNOSIS — S41102A Unspecified open wound of left upper arm, initial encounter: Secondary | ICD-10-CM | POA: Diagnosis not present

## 2022-04-24 DIAGNOSIS — Z7982 Long term (current) use of aspirin: Secondary | ICD-10-CM | POA: Diagnosis not present

## 2022-05-11 DIAGNOSIS — I739 Peripheral vascular disease, unspecified: Secondary | ICD-10-CM | POA: Diagnosis not present

## 2022-05-11 DIAGNOSIS — M79672 Pain in left foot: Secondary | ICD-10-CM | POA: Diagnosis not present

## 2022-05-11 DIAGNOSIS — M79671 Pain in right foot: Secondary | ICD-10-CM | POA: Diagnosis not present

## 2022-05-11 DIAGNOSIS — L110 Acquired keratosis follicularis: Secondary | ICD-10-CM | POA: Diagnosis not present

## 2022-05-22 DIAGNOSIS — Z79899 Other long term (current) drug therapy: Secondary | ICD-10-CM | POA: Diagnosis not present

## 2022-05-22 DIAGNOSIS — I83029 Varicose veins of left lower extremity with ulcer of unspecified site: Secondary | ICD-10-CM | POA: Diagnosis not present

## 2022-05-22 DIAGNOSIS — Z885 Allergy status to narcotic agent status: Secondary | ICD-10-CM | POA: Diagnosis not present

## 2022-05-22 DIAGNOSIS — I872 Venous insufficiency (chronic) (peripheral): Secondary | ICD-10-CM | POA: Diagnosis not present

## 2022-05-22 DIAGNOSIS — S41102A Unspecified open wound of left upper arm, initial encounter: Secondary | ICD-10-CM | POA: Diagnosis not present

## 2022-05-22 DIAGNOSIS — Z882 Allergy status to sulfonamides status: Secondary | ICD-10-CM | POA: Diagnosis not present

## 2022-05-22 DIAGNOSIS — E785 Hyperlipidemia, unspecified: Secondary | ICD-10-CM | POA: Diagnosis not present

## 2022-05-22 DIAGNOSIS — I10 Essential (primary) hypertension: Secondary | ICD-10-CM | POA: Diagnosis not present

## 2022-05-22 DIAGNOSIS — Z7982 Long term (current) use of aspirin: Secondary | ICD-10-CM | POA: Diagnosis not present

## 2022-05-22 DIAGNOSIS — L97929 Non-pressure chronic ulcer of unspecified part of left lower leg with unspecified severity: Secondary | ICD-10-CM | POA: Diagnosis not present

## 2022-05-24 ENCOUNTER — Other Ambulatory Visit: Payer: Self-pay | Admitting: Family Medicine

## 2022-05-24 DIAGNOSIS — I63512 Cerebral infarction due to unspecified occlusion or stenosis of left middle cerebral artery: Secondary | ICD-10-CM

## 2022-05-24 DIAGNOSIS — I10 Essential (primary) hypertension: Secondary | ICD-10-CM

## 2022-05-29 DIAGNOSIS — Z7901 Long term (current) use of anticoagulants: Secondary | ICD-10-CM | POA: Diagnosis not present

## 2022-05-29 DIAGNOSIS — Z888 Allergy status to other drugs, medicaments and biological substances status: Secondary | ICD-10-CM | POA: Diagnosis not present

## 2022-05-29 DIAGNOSIS — I4891 Unspecified atrial fibrillation: Secondary | ICD-10-CM | POA: Diagnosis not present

## 2022-05-29 DIAGNOSIS — M810 Age-related osteoporosis without current pathological fracture: Secondary | ICD-10-CM | POA: Diagnosis not present

## 2022-05-29 DIAGNOSIS — Z885 Allergy status to narcotic agent status: Secondary | ICD-10-CM | POA: Diagnosis not present

## 2022-05-29 DIAGNOSIS — S41102A Unspecified open wound of left upper arm, initial encounter: Secondary | ICD-10-CM | POA: Diagnosis not present

## 2022-05-29 DIAGNOSIS — L97929 Non-pressure chronic ulcer of unspecified part of left lower leg with unspecified severity: Secondary | ICD-10-CM | POA: Diagnosis not present

## 2022-05-29 DIAGNOSIS — I83028 Varicose veins of left lower extremity with ulcer other part of lower leg: Secondary | ICD-10-CM | POA: Diagnosis not present

## 2022-05-29 DIAGNOSIS — I83019 Varicose veins of right lower extremity with ulcer of unspecified site: Secondary | ICD-10-CM | POA: Diagnosis not present

## 2022-05-29 DIAGNOSIS — L97919 Non-pressure chronic ulcer of unspecified part of right lower leg with unspecified severity: Secondary | ICD-10-CM | POA: Diagnosis not present

## 2022-05-29 DIAGNOSIS — Z85828 Personal history of other malignant neoplasm of skin: Secondary | ICD-10-CM | POA: Diagnosis not present

## 2022-05-29 DIAGNOSIS — Z8673 Personal history of transient ischemic attack (TIA), and cerebral infarction without residual deficits: Secondary | ICD-10-CM | POA: Diagnosis not present

## 2022-05-29 DIAGNOSIS — I10 Essential (primary) hypertension: Secondary | ICD-10-CM | POA: Diagnosis not present

## 2022-05-29 DIAGNOSIS — E785 Hyperlipidemia, unspecified: Secondary | ICD-10-CM | POA: Diagnosis not present

## 2022-05-29 DIAGNOSIS — I872 Venous insufficiency (chronic) (peripheral): Secondary | ICD-10-CM | POA: Diagnosis not present

## 2022-05-29 DIAGNOSIS — Z882 Allergy status to sulfonamides status: Secondary | ICD-10-CM | POA: Diagnosis not present

## 2022-05-29 DIAGNOSIS — M1990 Unspecified osteoarthritis, unspecified site: Secondary | ICD-10-CM | POA: Diagnosis not present

## 2022-05-29 DIAGNOSIS — Z887 Allergy status to serum and vaccine status: Secondary | ICD-10-CM | POA: Diagnosis not present

## 2022-05-29 DIAGNOSIS — Z79899 Other long term (current) drug therapy: Secondary | ICD-10-CM | POA: Diagnosis not present

## 2022-05-29 DIAGNOSIS — Z7982 Long term (current) use of aspirin: Secondary | ICD-10-CM | POA: Diagnosis not present

## 2022-05-29 DIAGNOSIS — I83029 Varicose veins of left lower extremity with ulcer of unspecified site: Secondary | ICD-10-CM | POA: Diagnosis not present

## 2022-06-12 DIAGNOSIS — I4891 Unspecified atrial fibrillation: Secondary | ICD-10-CM | POA: Diagnosis not present

## 2022-06-12 DIAGNOSIS — I10 Essential (primary) hypertension: Secondary | ICD-10-CM | POA: Diagnosis not present

## 2022-06-12 DIAGNOSIS — Z7901 Long term (current) use of anticoagulants: Secondary | ICD-10-CM | POA: Diagnosis not present

## 2022-06-12 DIAGNOSIS — Z7982 Long term (current) use of aspirin: Secondary | ICD-10-CM | POA: Diagnosis not present

## 2022-06-12 DIAGNOSIS — E785 Hyperlipidemia, unspecified: Secondary | ICD-10-CM | POA: Diagnosis not present

## 2022-06-12 DIAGNOSIS — I83019 Varicose veins of right lower extremity with ulcer of unspecified site: Secondary | ICD-10-CM | POA: Diagnosis not present

## 2022-06-12 DIAGNOSIS — S41102A Unspecified open wound of left upper arm, initial encounter: Secondary | ICD-10-CM | POA: Diagnosis not present

## 2022-06-12 DIAGNOSIS — M810 Age-related osteoporosis without current pathological fracture: Secondary | ICD-10-CM | POA: Diagnosis not present

## 2022-06-12 DIAGNOSIS — M1990 Unspecified osteoarthritis, unspecified site: Secondary | ICD-10-CM | POA: Diagnosis not present

## 2022-06-12 DIAGNOSIS — Z79899 Other long term (current) drug therapy: Secondary | ICD-10-CM | POA: Diagnosis not present

## 2022-06-12 DIAGNOSIS — I83028 Varicose veins of left lower extremity with ulcer other part of lower leg: Secondary | ICD-10-CM | POA: Diagnosis not present

## 2022-06-12 DIAGNOSIS — Z8673 Personal history of transient ischemic attack (TIA), and cerebral infarction without residual deficits: Secondary | ICD-10-CM | POA: Diagnosis not present

## 2022-06-12 DIAGNOSIS — I872 Venous insufficiency (chronic) (peripheral): Secondary | ICD-10-CM | POA: Diagnosis not present

## 2022-06-12 DIAGNOSIS — Z85828 Personal history of other malignant neoplasm of skin: Secondary | ICD-10-CM | POA: Diagnosis not present

## 2022-06-12 DIAGNOSIS — Z888 Allergy status to other drugs, medicaments and biological substances status: Secondary | ICD-10-CM | POA: Diagnosis not present

## 2022-06-12 DIAGNOSIS — L97919 Non-pressure chronic ulcer of unspecified part of right lower leg with unspecified severity: Secondary | ICD-10-CM | POA: Diagnosis not present

## 2022-06-12 DIAGNOSIS — Z887 Allergy status to serum and vaccine status: Secondary | ICD-10-CM | POA: Diagnosis not present

## 2022-06-12 DIAGNOSIS — Z882 Allergy status to sulfonamides status: Secondary | ICD-10-CM | POA: Diagnosis not present

## 2022-06-12 DIAGNOSIS — I83029 Varicose veins of left lower extremity with ulcer of unspecified site: Secondary | ICD-10-CM | POA: Diagnosis not present

## 2022-06-12 DIAGNOSIS — Z885 Allergy status to narcotic agent status: Secondary | ICD-10-CM | POA: Diagnosis not present

## 2022-06-12 DIAGNOSIS — L97929 Non-pressure chronic ulcer of unspecified part of left lower leg with unspecified severity: Secondary | ICD-10-CM | POA: Diagnosis not present

## 2022-06-22 ENCOUNTER — Other Ambulatory Visit: Payer: Self-pay | Admitting: Family Medicine

## 2022-06-22 DIAGNOSIS — I63512 Cerebral infarction due to unspecified occlusion or stenosis of left middle cerebral artery: Secondary | ICD-10-CM

## 2022-06-23 ENCOUNTER — Encounter: Payer: Self-pay | Admitting: Family Medicine

## 2022-06-23 ENCOUNTER — Ambulatory Visit (INDEPENDENT_AMBULATORY_CARE_PROVIDER_SITE_OTHER): Payer: Medicare Other | Admitting: Family Medicine

## 2022-06-23 VITALS — BP 130/72 | HR 69 | Temp 98.6°F | Ht 65.0 in | Wt 130.0 lb

## 2022-06-23 DIAGNOSIS — S51811A Laceration without foreign body of right forearm, initial encounter: Secondary | ICD-10-CM | POA: Diagnosis not present

## 2022-06-23 DIAGNOSIS — Z7901 Long term (current) use of anticoagulants: Secondary | ICD-10-CM

## 2022-06-23 DIAGNOSIS — Z8673 Personal history of transient ischemic attack (TIA), and cerebral infarction without residual deficits: Secondary | ICD-10-CM

## 2022-06-23 DIAGNOSIS — Z5181 Encounter for therapeutic drug level monitoring: Secondary | ICD-10-CM | POA: Diagnosis not present

## 2022-06-23 DIAGNOSIS — I4811 Longstanding persistent atrial fibrillation: Secondary | ICD-10-CM

## 2022-06-23 DIAGNOSIS — R791 Abnormal coagulation profile: Secondary | ICD-10-CM

## 2022-06-23 LAB — COAGUCHEK XS/INR WAIVED
INR: 1.6 — ABNORMAL HIGH (ref 0.9–1.1)
Prothrombin Time: 19.2 s

## 2022-06-23 NOTE — Progress Notes (Signed)
Subjective: CC:INR check PCP: [name], DO ZOX:WRUE Eisenberger is a 89 y.o. female presenting to clinic today for:  1. Afib INR goal 2-3. She is brought to the office by her daughter Tyler Aas.  She reports some bleeding at the skin tear on the right arm.  Her caregiver is also present today and notes she wrapped it in a bandage to help with bleeding.  She was on oral antibiotics a couple weeks ago but nothing new.  She had broccoli a couple of days ago but again no excessive vitamin K intake.   ROS: Per HPI  Allergies  Allergen Reactions   Codeine Other (See Comments)   Influenza Vaccines    Remeron [Mirtazapine] Other (See Comments)    Dizziness    Sulfa Antibiotics Hives   Past Medical History:  Diagnosis Date   Acute cystitis with hematuria 11/22/2020   Atrial fibrillation (HCC)    BCC (basal cell carcinoma of skin) 11/14/2004   Right Mid Cheek(Cx3,5FU)   BCC (basal cell carcinoma of skin) 04/16/2011   Right Crease(tx p bx)   BCC (basal cell carcinoma of skin) 06/16/2016   Left Nare(tx p bx)   BCC (basal cell carcinoma of skin) Atypical Basaloid 09/29/2018   Left Side Nose(tx p bx)   Hypertension    Osteoporosis    Pneumonia of lower lobe due to infectious organism 11/20/2020   SCC (squamous cell carcinoma) 09/29/2018   Left Jawline(tx p bx)   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   Right Jawline(Cx3,5FU) and Left Upper Cheek(Cx3,5FU)   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   Left Jawline(tx p bx), Nose(tx p bx), Right Cheek(tx p bx), and Right Forearm(tx p bx)   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   Right Temple(Cx3,5FU) and Left Outer Eye(Cx3,5FU)   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   Right Forearm(Cx3,5FU)   Squamous cell carcinoma in situ (SCCIS) 03/13/2005   Left Side of Nose(Cx3,5FU)   Squamous cell carcinoma in situ (SCCIS) 06/04/2005   Inner Right Cheek/Eye(Tx p Bx)   Squamous cell carcinoma in situ  (SCCIS) 02/12/2010   Right Cheek(tx p bx)   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 2 09/29/2018   Upper Lip(tx p bx) and Bridge of Nose(tx p bx)   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   Left Forehead, Left Neck, Right Nose, and Right Hand   Stroke (HCC)    Tremor    Weakness 11/20/2020    Current Outpatient Medications:    amLODipine (NORVASC) 5 MG tablet, TAKE 1 AND 1/2 TABLETS BY MOUTH DAILY, Disp: 135 tablet, Rfl: 0   aspirin EC 81 MG tablet, Take 81 mg by mouth 3 (three) times a week., Disp: , Rfl:    atorvastatin (LIPITOR) 40 MG tablet, TAKE 1 TABLET BY MOUTH AT BEDTIME, Disp: 30 tablet, Rfl: 0   clobetasol cream (TEMOVATE) 0.05 %, APPLY TO THE AFFECTED AREA(S) TWICE DAILY UNTIL PSORIATIC LESION RESOLVED, Disp: 120 g, Rfl: 2   lisinopril (ZESTRIL) 30 MG tablet, TAKE 1 TABLET BY MOUTH EVERY DAY (note DOSE CHANGE), Disp: 90 tablet, Rfl: 0   polyethylene glycol (MIRALAX / GLYCOLAX) 17 g packet, Take 17 g by mouth daily., Disp: , Rfl:    primidone (MYSOLINE) 50 MG tablet, TAKE 1 AND 1/2 TABLETS BY MOUTH THREE TIMES DAILY, Disp: 405 tablet, Rfl: 0   warfarin (COUMADIN) 5 MG tablet, TAKE 1 AND 1/2 TABLETS BY MOUTH DAILY  AT 4pm EXCEPT ON MONDAYS, TUESDAYS, AND SATURDAYS TAKE 1 TABLET DAILY. (Patient taking differently: Takes 1 and 1/2 tablet every day except 1 tablet on Saturday), Disp: 135 tablet, Rfl: 3  Current Facility-Administered Medications:    cefTRIAXone (ROCEPHIN) injection 1 g, 1 g, Intramuscular, Once, [name], DO Social History   Socioeconomic History   Marital status: Widowed    Spouse name: Not on file   Number of children: 3   Years of education: Not on file   Highest education level: Not on file  Occupational History   Occupation: Retired  Tobacco Use   Smoking status: Never   Smokeless tobacco: Never  Vaping Use   Vaping Use: Never used  Substance and Sexual Activity   Alcohol use: Never   Drug use: Never   Sexual activity: Not  Currently  Other Topics Concern   Not on file  Social History Narrative   Not on file   Social Determinants of Health   Financial Resource Strain: Not on file  Food Insecurity: Not on file  Transportation Needs: Not on file  Physical Activity: Not on file  Stress: Not on file  Social Connections: Not on file  Intimate Partner Violence: Not on file   Family History  Problem Relation Age of Onset   Dementia Mother    Stroke Father    Heart disease Father    Diabetes Sister    Stroke Son     Objective: Office vital signs reviewed. BP 130/72   Pulse 69   Temp 98.6 F (37 C)   Ht 5\' 5"  (1.651 m)   Wt 130 lb (59 kg)   SpO2 96%   BMI 21.63 kg/m   Physical Examination:  General: Awake, alert, nontoxic-appearing elderly female, No acute distress HEENT: Resting jaw tremor. Cardio: Irregularly irregular with rate control, S1S2 heard, no murmurs appreciated Pulm: clear to auscultation bilaterally, no wheezes, rhonchi or rales; normal work of breathing on room air MSK: Ambulates with assistance/cane Skin: 2x rounded skin tears noted along the dorsal aspect of the right forearm.  Wounds on lower extremities have healed over.  Postinflammatory hyperpigmentation present  Assessment/ Plan: 89 y.o. female   Subtherapeutic international normalized ratio (INR)  Longstanding persistent atrial fibrillation (HCC) - Plan: CoaguChek XS/INR Waived  Anticoagulation goal of INR 2 to 3 - Plan: CoaguChek XS/INR Waived  History of CVA in adulthood - Plan: CoaguChek XS/INR Waived  Skin tear of right forearm without complication, initial encounter  INR subtherapeutic at 1.6.  Increase to 2 tablets of the 5 mg today and then resume 7.5 mg daily except 5 mg on Saturdays.  Close follow-up in 10 days scheduled and patient's family is aware of date and time  Skin tear appears uncomplicated at this time with no evidence of secondary bacterial infection or excessive bleeding.  Pressure bandage  applied using Xeroform, Telfa and Coban.  No orders of the defined types were placed in this encounter.  No orders of the defined types were placed in this encounter.    [name], DO [facility] [phone]

## 2022-06-26 DIAGNOSIS — L97919 Non-pressure chronic ulcer of unspecified part of right lower leg with unspecified severity: Secondary | ICD-10-CM | POA: Diagnosis not present

## 2022-06-26 DIAGNOSIS — Z79899 Other long term (current) drug therapy: Secondary | ICD-10-CM | POA: Diagnosis not present

## 2022-06-26 DIAGNOSIS — Z882 Allergy status to sulfonamides status: Secondary | ICD-10-CM | POA: Diagnosis not present

## 2022-06-26 DIAGNOSIS — Z885 Allergy status to narcotic agent status: Secondary | ICD-10-CM | POA: Diagnosis not present

## 2022-06-26 DIAGNOSIS — I10 Essential (primary) hypertension: Secondary | ICD-10-CM | POA: Diagnosis not present

## 2022-06-26 DIAGNOSIS — L97929 Non-pressure chronic ulcer of unspecified part of left lower leg with unspecified severity: Secondary | ICD-10-CM | POA: Diagnosis not present

## 2022-06-26 DIAGNOSIS — I83018 Varicose veins of right lower extremity with ulcer other part of lower leg: Secondary | ICD-10-CM | POA: Diagnosis not present

## 2022-06-26 DIAGNOSIS — E785 Hyperlipidemia, unspecified: Secondary | ICD-10-CM | POA: Diagnosis not present

## 2022-06-26 DIAGNOSIS — S41102A Unspecified open wound of left upper arm, initial encounter: Secondary | ICD-10-CM | POA: Diagnosis not present

## 2022-06-26 DIAGNOSIS — Z7982 Long term (current) use of aspirin: Secondary | ICD-10-CM | POA: Diagnosis not present

## 2022-06-26 DIAGNOSIS — I83028 Varicose veins of left lower extremity with ulcer other part of lower leg: Secondary | ICD-10-CM | POA: Diagnosis not present

## 2022-06-26 DIAGNOSIS — I872 Venous insufficiency (chronic) (peripheral): Secondary | ICD-10-CM | POA: Diagnosis not present

## 2022-07-07 ENCOUNTER — Ambulatory Visit (INDEPENDENT_AMBULATORY_CARE_PROVIDER_SITE_OTHER): Payer: Medicare Other | Admitting: Family Medicine

## 2022-07-07 VITALS — BP 124/70 | HR 77 | Ht 65.0 in | Wt 129.0 lb

## 2022-07-07 DIAGNOSIS — R634 Abnormal weight loss: Secondary | ICD-10-CM

## 2022-07-07 DIAGNOSIS — R791 Abnormal coagulation profile: Secondary | ICD-10-CM | POA: Diagnosis not present

## 2022-07-07 DIAGNOSIS — Z5181 Encounter for therapeutic drug level monitoring: Secondary | ICD-10-CM | POA: Diagnosis not present

## 2022-07-07 DIAGNOSIS — Z7901 Long term (current) use of anticoagulants: Secondary | ICD-10-CM | POA: Diagnosis not present

## 2022-07-07 DIAGNOSIS — I4811 Longstanding persistent atrial fibrillation: Secondary | ICD-10-CM | POA: Diagnosis not present

## 2022-07-07 LAB — COAGUCHEK XS/INR WAIVED
INR: 1.8 — ABNORMAL HIGH (ref 0.9–1.1)
Prothrombin Time: 21.4 s

## 2022-07-07 NOTE — Progress Notes (Signed)
Subjective: CC:INR check PCP: [name], DO AOZ:HYQM Dominique Farley is a 89 y.o. female presenting to clinic today for:  1. Subtherapeutic INR Goal 2-3.  Last check was 1.6.  Instructed to increase to 2 tablets that day then resume normal dosing.  She reports some bruising on left forearm.  Does not remember bumping into anything.  2.  Weight loss Continues to experience weight loss.  She is drinking boost once daily and has 3 meals per day, 1 of which is prepared in the morning, afternoon is typically a banana sandwich and evening as Meals on Wheels or something prepared by her daughters.  ROS: Per HPI  Allergies  Allergen Reactions   Codeine Other (See Comments)   Influenza Vaccines    Remeron [Mirtazapine] Other (See Comments)    Dizziness    Sulfa Antibiotics Hives   Past Medical History:  Diagnosis Date   Acute cystitis with hematuria 11/22/2020   Atrial fibrillation (HCC)    BCC (basal cell carcinoma of skin) 11/14/2004   Right Mid Cheek(Cx3,5FU)   BCC (basal cell carcinoma of skin) 04/16/2011   Right Crease(tx p bx)   BCC (basal cell carcinoma of skin) 06/16/2016   Left Nare(tx p bx)   BCC (basal cell carcinoma of skin) Atypical Basaloid 09/29/2018   Left Side Nose(tx p bx)   Hypertension    Osteoporosis    Pneumonia of lower lobe due to infectious organism 11/20/2020   SCC (squamous cell carcinoma) 09/29/2018   Left Jawline(tx p bx)   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   Right Jawline(Cx3,5FU) and Left Upper Cheek(Cx3,5FU)   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   Left Jawline(tx p bx), Nose(tx p bx), Right Cheek(tx p bx), and Right Forearm(tx p bx)   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   Right Temple(Cx3,5FU) and Left Outer Eye(Cx3,5FU)   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   Right Forearm(Cx3,5FU)   Squamous cell carcinoma in situ (SCCIS) 03/13/2005   Left Side of Nose(Cx3,5FU)   Squamous cell carcinoma  in situ (SCCIS) 06/04/2005   Inner Right Cheek/Eye(Tx p Bx)   Squamous cell carcinoma in situ (SCCIS) 02/12/2010   Right Cheek(tx p bx)   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 2 09/29/2018   Upper Lip(tx p bx) and Bridge of Nose(tx p bx)   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   Left Forehead, Left Neck, Right Nose, and Right Hand   Stroke (HCC)    Tremor    Weakness 11/20/2020    Current Outpatient Medications:    amLODipine (NORVASC) 5 MG tablet, TAKE 1 AND 1/2 TABLETS BY MOUTH DAILY, Disp: 135 tablet, Rfl: 0   aspirin EC 81 MG tablet, Take 81 mg by mouth 3 (three) times a week., Disp: , Rfl:    atorvastatin (LIPITOR) 40 MG tablet, TAKE 1 TABLET BY MOUTH AT BEDTIME, Disp: 30 tablet, Rfl: 0   clobetasol cream (TEMOVATE) 0.05 %, APPLY TO THE AFFECTED AREA(S) TWICE DAILY UNTIL PSORIATIC LESION RESOLVED, Disp: 120 g, Rfl: 2   lisinopril (ZESTRIL) 30 MG tablet, TAKE 1 TABLET BY MOUTH EVERY DAY (note DOSE CHANGE), Disp: 90 tablet, Rfl: 0   polyethylene glycol (MIRALAX / GLYCOLAX) 17 g packet, Take 17 g by mouth daily., Disp: , Rfl:    primidone (MYSOLINE) 50 MG tablet, TAKE 1 AND 1/2 TABLETS BY MOUTH THREE TIMES DAILY, Disp: 405 tablet, Rfl: 0   warfarin (COUMADIN) 5  MG tablet, TAKE 1 AND 1/2 TABLETS BY MOUTH DAILY AT 4pm EXCEPT ON MONDAYS, TUESDAYS, AND SATURDAYS TAKE 1 TABLET DAILY. (Patient taking differently: Takes 1 and 1/2 tablet every day except 1 tablet on Saturday), Disp: 135 tablet, Rfl: 3  Current Facility-Administered Medications:    cefTRIAXone (ROCEPHIN) injection 1 g, 1 g, Intramuscular, Once, [name], DO Social History   Socioeconomic History   Marital status: Widowed    Spouse name: Not on file   Number of children: 3   Years of education: Not on file   Highest education level: Not on file  Occupational History   Occupation: Retired  Tobacco Use   Smoking status: Never   Smokeless tobacco: Never  Vaping Use   Vaping Use: Never used   Substance and Sexual Activity   Alcohol use: Never   Drug use: Never   Sexual activity: Not Currently  Other Topics Concern   Not on file  Social History Narrative   Not on file   Social Determinants of Health   Financial Resource Strain: Not on file  Food Insecurity: Not on file  Transportation Needs: Not on file  Physical Activity: Not on file  Stress: Not on file  Social Connections: Not on file  Intimate Partner Violence: Not on file   Family History  Problem Relation Age of Onset   Dementia Mother    Stroke Father    Heart disease Father    Diabetes Sister    Stroke Son     Objective: Office vital signs reviewed. BP 124/70   Pulse 77   Ht 5\' 5"  (1.651 m)   Wt 129 lb (58.5 kg)   BMI 21.47 kg/m   Physical Examination:  General: Awake, alert, well nourished, No acute distress HEENT: sclera white, MMM Skin : right forearm with healing skin tear.  No evidence of secondary infection.  Bilateral forearms with healing ecchymosis noted  Assessment/ Plan: 89 y.o. female   Longstanding persistent atrial fibrillation (HCC) - Plan: CoaguChek XS/INR Waived  Anticoagulation goal of INR 2 to 3 - Plan: CoaguChek XS/INR Waived  Subtherapeutic international normalized ratio (INR)  Weight loss  INR is subtherapeutic today at 1.8.  She will increase to 2 tablets today and then use 1.5 tablets daily thereafter.  2-week follow-up scheduled  We discussed her ongoing weight loss today.  I do think it is a combination of advanced age and just reduction in appetite but we discussed that we could not rule out malignant processes.  Patient would not want to pursue any type of treatment if there were 1.  So for this reason we will continue to encourage adequate p.o. intake and I would like her to increase boost to twice daily.  No orders of the defined types were placed in this encounter.  No orders of the defined types were placed in this encounter.    [name],  DO [facility] [phone]

## 2022-07-20 DIAGNOSIS — I739 Peripheral vascular disease, unspecified: Secondary | ICD-10-CM | POA: Diagnosis not present

## 2022-07-20 DIAGNOSIS — M79671 Pain in right foot: Secondary | ICD-10-CM | POA: Diagnosis not present

## 2022-07-20 DIAGNOSIS — L110 Acquired keratosis follicularis: Secondary | ICD-10-CM | POA: Diagnosis not present

## 2022-07-20 DIAGNOSIS — M79672 Pain in left foot: Secondary | ICD-10-CM | POA: Diagnosis not present

## 2022-07-21 ENCOUNTER — Encounter: Payer: Self-pay | Admitting: Family Medicine

## 2022-07-21 ENCOUNTER — Ambulatory Visit (INDEPENDENT_AMBULATORY_CARE_PROVIDER_SITE_OTHER): Payer: Medicare Other | Admitting: Family Medicine

## 2022-07-21 ENCOUNTER — Other Ambulatory Visit: Payer: Self-pay | Admitting: Family Medicine

## 2022-07-21 VITALS — HR 86 | Temp 98.3°F | Ht 65.0 in | Wt 131.0 lb

## 2022-07-21 DIAGNOSIS — I4811 Longstanding persistent atrial fibrillation: Secondary | ICD-10-CM | POA: Diagnosis not present

## 2022-07-21 DIAGNOSIS — G250 Essential tremor: Secondary | ICD-10-CM

## 2022-07-21 DIAGNOSIS — I63512 Cerebral infarction due to unspecified occlusion or stenosis of left middle cerebral artery: Secondary | ICD-10-CM

## 2022-07-21 DIAGNOSIS — R791 Abnormal coagulation profile: Secondary | ICD-10-CM | POA: Diagnosis not present

## 2022-07-21 DIAGNOSIS — Z7901 Long term (current) use of anticoagulants: Secondary | ICD-10-CM | POA: Diagnosis not present

## 2022-07-21 DIAGNOSIS — I6930 Unspecified sequelae of cerebral infarction: Secondary | ICD-10-CM

## 2022-07-21 DIAGNOSIS — Z5181 Encounter for therapeutic drug level monitoring: Secondary | ICD-10-CM | POA: Diagnosis not present

## 2022-07-21 LAB — COAGUCHEK XS/INR WAIVED
INR: 1.5 — ABNORMAL HIGH (ref 0.9–1.1)
Prothrombin Time: 17.8 s

## 2022-07-21 MED ORDER — WARFARIN SODIUM 5 MG PO TABS: ORAL_TABLET | ORAL | 3 refills | Status: DC

## 2022-07-21 NOTE — Progress Notes (Signed)
Subjective: CC: Follow-up subtherapeutic INR PCP: [name], DO WUJ:WJXB Dominique Farley is a 89 y.o. female presenting to clinic today for:  1.  Subtherapeutic INR Goal INR 2-3 Patient has been compliant with 1.5 tablets of the Coumadin to equal 7.5 mg daily since her last visit approximately 2 weeks ago.  At that time her INR was 1.8.  Denies any recent antibiotic use or consumption of leafy greens etc.  However, after further discussion of diet she has been eating a lot of tomatoes, cucumbers, grapes.  She also has upped her boost intake to twice daily.  She was not aware that this had significant vitamin K.  She is currently accompanied by her caregiver and her daughter   ROS: Per HPI  Allergies  Allergen Reactions   Codeine Other (See Comments)   Influenza Vaccines    Remeron [Mirtazapine] Other (See Comments)    Dizziness    Sulfa Antibiotics Hives   Past Medical History:  Diagnosis Date   Acute cystitis with hematuria 11/22/2020   Atrial fibrillation (HCC)    BCC (basal cell carcinoma of skin) 11/14/2004   Right Mid Cheek(Cx3,5FU)   BCC (basal cell carcinoma of skin) 04/16/2011   Right Crease(tx p bx)   BCC (basal cell carcinoma of skin) 06/16/2016   Left Nare(tx p bx)   BCC (basal cell carcinoma of skin) Atypical Basaloid 09/29/2018   Left Side Nose(tx p bx)   Hypertension    Osteoporosis    Pneumonia of lower lobe due to infectious organism 11/20/2020   SCC (squamous cell carcinoma) 09/29/2018   Left Jawline(tx p bx)   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   Right Jawline(Cx3,5FU) and Left Upper Cheek(Cx3,5FU)   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   Left Jawline(tx p bx), Nose(tx p bx), Right Cheek(tx p bx), and Right Forearm(tx p bx)   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   Right Temple(Cx3,5FU) and Left Outer Eye(Cx3,5FU)   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   Right Forearm(Cx3,5FU)   Squamous cell carcinoma  in situ (SCCIS) 03/13/2005   Left Side of Nose(Cx3,5FU)   Squamous cell carcinoma in situ (SCCIS) 06/04/2005   Inner Right Cheek/Eye(Tx p Bx)   Squamous cell carcinoma in situ (SCCIS) 02/12/2010   Right Cheek(tx p bx)   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 2 09/29/2018   Upper Lip(tx p bx) and Bridge of Nose(tx p bx)   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   Left Forehead, Left Neck, Right Nose, and Right Hand   Stroke (HCC)    Tremor    Weakness 11/20/2020    Current Outpatient Medications:    amLODipine (NORVASC) 5 MG tablet, TAKE 1 AND 1/2 TABLETS BY MOUTH DAILY, Disp: 135 tablet, Rfl: 0   aspirin EC 81 MG tablet, Take 81 mg by mouth 3 (three) times a week., Disp: , Rfl:    atorvastatin (LIPITOR) 40 MG tablet, TAKE 1 TABLET BY MOUTH AT BEDTIME, Disp: 30 tablet, Rfl: 0   clobetasol cream (TEMOVATE) 0.05 %, APPLY TO THE AFFECTED AREA(S) TWICE DAILY UNTIL PSORIATIC LESION RESOLVED, Disp: 120 g, Rfl: 2   lisinopril (ZESTRIL) 30 MG tablet, TAKE 1 TABLET BY MOUTH EVERY DAY (note DOSE CHANGE), Disp: 90 tablet, Rfl: 0   polyethylene glycol (MIRALAX / GLYCOLAX) 17 g packet, Take 17 g by mouth daily., Disp: , Rfl:    primidone (MYSOLINE) 50 MG tablet, TAKE 1 AND 1/2  TABLETS BY MOUTH THREE TIMES DAILY, Disp: 405 tablet, Rfl: 0   warfarin (COUMADIN) 5 MG tablet, TAKE 1 AND 1/2 TABLETS BY MOUTH DAILY AT 4pm EXCEPT ON MONDAYS, TUESDAYS, AND SATURDAYS TAKE 1 TABLET DAILY. (Patient taking differently: Takes 1 and 1/2 tablet every day except 1 tablet on Saturday), Disp: 135 tablet, Rfl: 3  Current Facility-Administered Medications:    cefTRIAXone (ROCEPHIN) injection 1 g, 1 g, Intramuscular, Once, [name], DO Social History   Socioeconomic History   Marital status: Widowed    Spouse name: Not on file   Number of children: 3   Years of education: Not on file   Highest education level: Not on file  Occupational History   Occupation: Retired  Tobacco Use    Smoking status: Never   Smokeless tobacco: Never  Vaping Use   Vaping Use: Never used  Substance and Sexual Activity   Alcohol use: Never   Drug use: Never   Sexual activity: Not Currently  Other Topics Concern   Not on file  Social History Narrative   Not on file   Social Determinants of Health   Financial Resource Strain: Not on file  Food Insecurity: Not on file  Transportation Needs: Not on file  Physical Activity: Not on file  Stress: Not on file  Social Connections: Not on file  Intimate Partner Violence: Not on file   Family History  Problem Relation Age of Onset   Dementia Mother    Stroke Father    Heart disease Father    Diabetes Sister    Stroke Son     Objective: Office vital signs reviewed. Pulse 86   Temp 98.3 F (36.8 C)   Ht 5\' 5"  (1.651 m)   Wt 131 lb (59.4 kg)   SpO2 96%   BMI 21.80 kg/m   Physical Examination:  General: Awake, alert, thin, nontoxic-appearing female, No acute distress HEENT: sclera white, MMM Cardio: Irregularly irregular with rate controlled.  S1S2 heard, no murmurs appreciated Pulm: clear to auscultation bilaterally, no wheezes, rhonchi or rales; normal work of breathing on room air    Assessment/ Plan: 89 y.o. female   Anticoagulation goal of INR 2 to 3 - Plan: CoaguChek XS/INR Waived  Subtherapeutic international normalized ratio (INR)  Longstanding persistent atrial fibrillation (HCC) - Plan: warfarin (COUMADIN) 5 MG tablet  Cerebrovascular accident (CVA) due to occlusion of left middle cerebral artery (HCC) - Plan: warfarin (COUMADIN) 5 MG tablet  INR remains subtherapeutic at 1.5 today.  Sounds like this is likely related to current diet.  We discussed simply cutting back on some of these vitamin K moderately to high rich foods.  I am going to advance her to 2 tablets twice weekly starting today with 1.5 tablets daily all others.  2-week follow-up scheduled  Orders Placed This Encounter  Procedures   CoaguChek  XS/INR Waived   No orders of the defined types were placed in this encounter.    [name], DO [facility] [phone]

## 2022-07-21 NOTE — Patient Instructions (Signed)
Reduce some of that vegetable intake.

## 2022-08-04 ENCOUNTER — Ambulatory Visit (INDEPENDENT_AMBULATORY_CARE_PROVIDER_SITE_OTHER): Payer: Medicare Other | Admitting: Family Medicine

## 2022-08-04 ENCOUNTER — Encounter: Payer: Self-pay | Admitting: Family Medicine

## 2022-08-04 VITALS — BP 125/87 | HR 85 | Temp 98.7°F | Ht 65.0 in | Wt 129.0 lb

## 2022-08-04 DIAGNOSIS — I4811 Longstanding persistent atrial fibrillation: Secondary | ICD-10-CM

## 2022-08-04 DIAGNOSIS — Z5181 Encounter for therapeutic drug level monitoring: Secondary | ICD-10-CM

## 2022-08-04 DIAGNOSIS — Z8673 Personal history of transient ischemic attack (TIA), and cerebral infarction without residual deficits: Secondary | ICD-10-CM

## 2022-08-04 DIAGNOSIS — Z7901 Long term (current) use of anticoagulants: Secondary | ICD-10-CM

## 2022-08-04 LAB — COAGUCHEK XS/INR WAIVED
INR: 2.2 — ABNORMAL HIGH (ref 0.9–1.1)
Prothrombin Time: 26.5 s

## 2022-08-04 NOTE — Progress Notes (Signed)
Subjective: CC:INR check PCP: [name], DO WUJ:WJXB Stohr is a 89 y.o. female presenting to clinic today for:  1. Afib Goal INR 2-3. Treated with coumadin 10mg  2 days per week and 7.5mg  daily all other days. No bleeding reported.  Lesions on arms improving and almost healed.   ROS: Per HPI  Allergies  Allergen Reactions   Codeine Other (See Comments)   Influenza Vaccines    Remeron [Mirtazapine] Other (See Comments)    Dizziness    Sulfa Antibiotics Hives   Past Medical History:  Diagnosis Date   Acute cystitis with hematuria 11/22/2020   Atrial fibrillation (HCC)    BCC (basal cell carcinoma of skin) 11/14/2004   Right Mid Cheek(Cx3,5FU)   BCC (basal cell carcinoma of skin) 04/16/2011   Right Crease(tx p bx)   BCC (basal cell carcinoma of skin) 06/16/2016   Left Nare(tx p bx)   BCC (basal cell carcinoma of skin) Atypical Basaloid 09/29/2018   Left Side Nose(tx p bx)   Hypertension    Osteoporosis    Pneumonia of lower lobe due to infectious organism 11/20/2020   SCC (squamous cell carcinoma) 09/29/2018   Left Jawline(tx p bx)   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   Right Jawline(Cx3,5FU) and Left Upper Cheek(Cx3,5FU)   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   Left Jawline(tx p bx), Nose(tx p bx), Right Cheek(tx p bx), and Right Forearm(tx p bx)   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   Right Temple(Cx3,5FU) and Left Outer Eye(Cx3,5FU)   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   Right Forearm(Cx3,5FU)   Squamous cell carcinoma in situ (SCCIS) 03/13/2005   Left Side of Nose(Cx3,5FU)   Squamous cell carcinoma in situ (SCCIS) 06/04/2005   Inner Right Cheek/Eye(Tx p Bx)   Squamous cell carcinoma in situ (SCCIS) 02/12/2010   Right Cheek(tx p bx)   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 2 09/29/2018   Upper Lip(tx p bx) and Bridge of Nose(tx p bx)   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   Left  Forehead, Left Neck, Right Nose, and Right Hand   Stroke (HCC)    Tremor    Weakness 11/20/2020    Current Outpatient Medications:    amLODipine (NORVASC) 5 MG tablet, TAKE 1 AND 1/2 TABLETS BY MOUTH DAILY, Disp: 135 tablet, Rfl: 0   aspirin EC 81 MG tablet, Take 81 mg by mouth 3 (three) times a week., Disp: , Rfl:    atorvastatin (LIPITOR) 40 MG tablet, TAKE 1 TABLET BY MOUTH AT BEDTIME, Disp: 30 tablet, Rfl: 0   clobetasol cream (TEMOVATE) 0.05 %, APPLY TO THE AFFECTED AREA(S) TWICE DAILY UNTIL PSORIATIC LESION RESOLVED, Disp: 120 g, Rfl: 2   lisinopril (ZESTRIL) 30 MG tablet, TAKE 1 TABLET BY MOUTH EVERY DAY (note DOSE CHANGE), Disp: 90 tablet, Rfl: 0   polyethylene glycol (MIRALAX / GLYCOLAX) 17 g packet, Take 17 g by mouth daily., Disp: , Rfl:    primidone (MYSOLINE) 50 MG tablet, TAKE 1 AND 1/2 TABLETS BY MOUTH THREE TIMES DAILY, Disp: 405 tablet, Rfl: 0   warfarin (COUMADIN) 5 MG tablet, Take 2 tablets Tues/ Friday and 1.5 tablets daily all other days, Disp: 135 tablet, Rfl: 3  Current Facility-Administered Medications:    cefTRIAXone (ROCEPHIN) injection 1 g, 1 g, Intramuscular, Once, [name], DO Social History   Socioeconomic History   Marital status: Widowed    Spouse name: Not  on file   Number of children: 3   Years of education: Not on file   Highest education level: Not on file  Occupational History   Occupation: Retired  Tobacco Use   Smoking status: Never   Smokeless tobacco: Never  Vaping Use   Vaping status: Never Used  Substance and Sexual Activity   Alcohol use: Never   Drug use: Never   Sexual activity: Not Currently  Other Topics Concern   Not on file  Social History Narrative   Not on file   Social Determinants of Health   Financial Resource Strain: Low Risk  (11/21/2020)   Received from [facility], [facility]   Overall Financial Resource Strain (CARDIA)    Difficulty of Paying Living Expenses: Not hard at all  Food  Insecurity: No Food Insecurity (11/21/2020)   Received from [facility], [facility]   Hunger Vital Sign    Worried About Running Out of Food in the Last Year: Never true    Ran Out of Food in the Last Year: Never true  Transportation Needs: No Transportation Needs (11/21/2020)   Received from [facility], [facility]   [facility] - Transportation    Lack of Transportation (Medical): No    Lack of Transportation (Non-Medical): No  Physical Activity: Not on file  Stress: Not on file  Social Connections: Unknown (05/27/2021)   Received from [facility]   Social Network    Social Network: Not on file  Intimate Partner Violence: Unknown (04/18/2021)   Received from [facility]   HITS    Physically Hurt: Not on file    Insult or Talk Down To: Not on file    Threaten Physical Harm: Not on file    Scream or Curse: Not on file   Family History  Problem Relation Age of Onset   Dementia Mother    Stroke Father    Heart disease Father    Diabetes Sister    Stroke Son     Objective: Office vital signs reviewed. BP 125/87   Pulse 85   Temp 98.7 F (37.1 C)   Ht 5\' 5"  (1.651 m)   Wt 129 lb (58.5 kg)   SpO2 98%   BMI 21.47 kg/m   Physical Examination:  General: Awake, alert, well nourished, No acute distress HEENT: sclera white, MMM Cardio: regular rate with irregular rhythm, S1S2 heard, no murmurs appreciated Pulm: clear to auscultation bilaterally, no wheezes, rhonchi or rales; normal work of breathing on room air Skin: senile purpura present  Assessment/ Plan: 89 y.o. female   Anticoagulation goal of INR 2 to 3 - Plan: CoaguChek XS/INR Waived  Longstanding persistent atrial fibrillation (HCC)  History of CVA in adulthood  INR therapeutic.  Continue current regimen. Not a candidate for DOAC due to primadone use for tremor.  Follow up in 2 m scheduled.  Orders Placed This Encounter  Procedures   CoaguChek XS/INR Waived   No orders of the defined  types were placed in this encounter.    [name], DO [facility] [phone]

## 2022-08-19 ENCOUNTER — Other Ambulatory Visit: Payer: Self-pay | Admitting: Family Medicine

## 2022-08-19 DIAGNOSIS — I10 Essential (primary) hypertension: Secondary | ICD-10-CM

## 2022-08-19 DIAGNOSIS — L409 Psoriasis, unspecified: Secondary | ICD-10-CM

## 2022-08-19 DIAGNOSIS — I63512 Cerebral infarction due to unspecified occlusion or stenosis of left middle cerebral artery: Secondary | ICD-10-CM

## 2022-09-20 ENCOUNTER — Other Ambulatory Visit: Payer: Self-pay | Admitting: Family Medicine

## 2022-09-20 DIAGNOSIS — I63512 Cerebral infarction due to unspecified occlusion or stenosis of left middle cerebral artery: Secondary | ICD-10-CM

## 2022-09-24 ENCOUNTER — Emergency Department (HOSPITAL_COMMUNITY): Payer: Medicare Other

## 2022-09-24 ENCOUNTER — Encounter (HOSPITAL_COMMUNITY): Payer: Self-pay | Admitting: *Deleted

## 2022-09-24 ENCOUNTER — Emergency Department (HOSPITAL_COMMUNITY)
Admission: EM | Admit: 2022-09-24 | Discharge: 2022-09-24 | Disposition: A | Discharge status: Home / Self Care | Payer: Medicare Other | Attending: Emergency Medicine | Admitting: Emergency Medicine

## 2022-09-24 ENCOUNTER — Other Ambulatory Visit: Payer: Self-pay

## 2022-09-24 DIAGNOSIS — W06XXXA Fall from bed, initial encounter: Secondary | ICD-10-CM | POA: Insufficient documentation

## 2022-09-24 DIAGNOSIS — S0990XA Unspecified injury of head, initial encounter: Secondary | ICD-10-CM | POA: Diagnosis not present

## 2022-09-24 DIAGNOSIS — Z85828 Personal history of other malignant neoplasm of skin: Secondary | ICD-10-CM | POA: Insufficient documentation

## 2022-09-24 DIAGNOSIS — R0902 Hypoxemia: Secondary | ICD-10-CM | POA: Diagnosis not present

## 2022-09-24 DIAGNOSIS — S0083XA Contusion of other part of head, initial encounter: Secondary | ICD-10-CM | POA: Diagnosis not present

## 2022-09-24 DIAGNOSIS — S60221A Contusion of right hand, initial encounter: Secondary | ICD-10-CM | POA: Diagnosis present

## 2022-09-24 DIAGNOSIS — Z7982 Long term (current) use of aspirin: Secondary | ICD-10-CM | POA: Insufficient documentation

## 2022-09-24 DIAGNOSIS — R079 Chest pain, unspecified: Secondary | ICD-10-CM | POA: Diagnosis not present

## 2022-09-24 DIAGNOSIS — W19XXXA Unspecified fall, initial encounter: Secondary | ICD-10-CM

## 2022-09-24 DIAGNOSIS — I6523 Occlusion and stenosis of bilateral carotid arteries: Secondary | ICD-10-CM | POA: Diagnosis not present

## 2022-09-24 DIAGNOSIS — S62610A Displaced fracture of proximal phalanx of right index finger, initial encounter for closed fracture: Secondary | ICD-10-CM | POA: Diagnosis not present

## 2022-09-24 DIAGNOSIS — Z7901 Long term (current) use of anticoagulants: Secondary | ICD-10-CM | POA: Insufficient documentation

## 2022-09-24 DIAGNOSIS — S62612A Displaced fracture of proximal phalanx of right middle finger, initial encounter for closed fracture: Secondary | ICD-10-CM | POA: Diagnosis not present

## 2022-09-24 DIAGNOSIS — J90 Pleural effusion, not elsewhere classified: Secondary | ICD-10-CM | POA: Diagnosis not present

## 2022-09-24 DIAGNOSIS — S62616A Displaced fracture of proximal phalanx of right little finger, initial encounter for closed fracture: Secondary | ICD-10-CM | POA: Diagnosis not present

## 2022-09-24 DIAGNOSIS — I700 Atherosclerosis of aorta: Secondary | ICD-10-CM | POA: Diagnosis not present

## 2022-09-24 DIAGNOSIS — M25561 Pain in right knee: Secondary | ICD-10-CM | POA: Diagnosis not present

## 2022-09-24 DIAGNOSIS — M1711 Unilateral primary osteoarthritis, right knee: Secondary | ICD-10-CM | POA: Diagnosis not present

## 2022-09-24 DIAGNOSIS — I10 Essential (primary) hypertension: Secondary | ICD-10-CM | POA: Diagnosis not present

## 2022-09-24 DIAGNOSIS — S62614A Displaced fracture of proximal phalanx of right ring finger, initial encounter for closed fracture: Secondary | ICD-10-CM | POA: Insufficient documentation

## 2022-09-24 DIAGNOSIS — S62619A Displaced fracture of proximal phalanx of unspecified finger, initial encounter for closed fracture: Secondary | ICD-10-CM

## 2022-09-24 DIAGNOSIS — Z743 Need for continuous supervision: Secondary | ICD-10-CM | POA: Diagnosis not present

## 2022-09-24 DIAGNOSIS — Z79899 Other long term (current) drug therapy: Secondary | ICD-10-CM | POA: Diagnosis not present

## 2022-09-24 DIAGNOSIS — Z8673 Personal history of transient ischemic attack (TIA), and cerebral infarction without residual deficits: Secondary | ICD-10-CM | POA: Insufficient documentation

## 2022-09-24 DIAGNOSIS — M25559 Pain in unspecified hip: Secondary | ICD-10-CM | POA: Diagnosis not present

## 2022-09-24 DIAGNOSIS — S199XXA Unspecified injury of neck, initial encounter: Secondary | ICD-10-CM | POA: Diagnosis not present

## 2022-09-24 DIAGNOSIS — S81021A Laceration with foreign body, right knee, initial encounter: Secondary | ICD-10-CM | POA: Diagnosis not present

## 2022-09-24 DIAGNOSIS — M19041 Primary osteoarthritis, right hand: Secondary | ICD-10-CM | POA: Diagnosis not present

## 2022-09-24 LAB — BASIC METABOLIC PANEL
Anion gap: 9 (ref 5–15)
BUN: 18 mg/dL (ref 8–23)
CO2: 25 mmol/L (ref 22–32)
Calcium: 7.9 mg/dL — ABNORMAL LOW (ref 8.9–10.3)
Chloride: 98 mmol/L (ref 98–111)
Creatinine, Ser: 0.64 mg/dL (ref 0.44–1.00)
GFR, Estimated: >60 mL/min (ref >60)
Glucose, Bld: 125 mg/dL — ABNORMAL HIGH (ref 70–99)
Potassium: 3.8 mmol/L (ref 3.5–5.1)
Sodium: 132 mmol/L — ABNORMAL LOW (ref 135–145)

## 2022-09-24 LAB — PROTIME-INR
INR: 2.4 — ABNORMAL HIGH (ref 0.8–1.2)
Prothrombin Time: 26.3 s — ABNORMAL HIGH (ref 11.4–15.2)

## 2022-09-24 LAB — CBC
HCT: 32.4 % — ABNORMAL LOW (ref 36.0–46.0)
Hemoglobin: 10.6 g/dL — ABNORMAL LOW (ref 12.0–15.0)
MCH: 31.7 pg (ref 26.0–34.0)
MCHC: 32.7 g/dL (ref 30.0–36.0)
MCV: 97 fL (ref 80.0–100.0)
Platelets: 336 10*3/uL (ref 150–400)
RBC: 3.34 MIL/uL — ABNORMAL LOW (ref 3.87–5.11)
RDW: 13.6 % (ref 11.5–15.5)
WBC: 10.7 10*3/uL — ABNORMAL HIGH (ref 4.0–10.5)
nRBC: 0 % (ref 0.0–0.2)

## 2022-09-24 LAB — CK: Total CK: 85 U/L (ref 38–234)

## 2022-09-24 MED ORDER — LIDOCAINE-EPINEPHRINE-TETRACAINE (LET) TOPICAL GEL
6.0000 mL | Freq: Once | TOPICAL | Status: AC
  Administered 2022-09-24: 6 mL via TOPICAL
  Filled 2022-09-24: qty 6

## 2022-09-24 MED ORDER — LIDOCAINE HCL (PF) 1 % IJ SOLN
10.0000 mL | Freq: Once | INTRAMUSCULAR | Status: AC
  Administered 2022-09-24: 10 mL
  Filled 2022-09-24: qty 10

## 2022-09-24 NOTE — Discharge Instructions (Signed)
As discussed, you do have evidence of fractures of your right hand.  Recommend follow-up with orthopedics in the outpatient setting.  Elevate hand above level of heart to help decrease likelihood of swelling.  You may take Tylenol for pain.  Your red blood cells were slightly lower; recommend follow-up with primary care for repeat assessment.  Otherwise, CT scans and x-rays were negative for any abnormality from the fall.  Please do not hesitate to return to emergency department if there are worrisome signs and symptoms we discussed become apparent.

## 2022-09-24 NOTE — ED Notes (Addendum)
PA and NT at [facility]. Pt declined rectal/ hemoccult testing.

## 2022-09-24 NOTE — ED Notes (Signed)
Lab and family at [facility], no changes, alert, NAD, calm, interactive.

## 2022-09-24 NOTE — ED Triage Notes (Signed)
Pt from home, brought by EMS, unwitnessed fall, on coumadin. Bruising noted on right hand and right side of face. A&O. VS per EMS BP 128/71 P 69 O2 96 RA

## 2022-09-24 NOTE — ED Notes (Signed)
Splint application in process

## 2022-09-24 NOTE — ED Notes (Signed)
Back from xray, alert, NAD, calm, interactive.  

## 2022-09-24 NOTE — ED Notes (Signed)
R Hand splinted back to xray

## 2022-09-24 NOTE — ED Notes (Signed)
Not in area. Likely in radiology.

## 2022-09-24 NOTE — ED Notes (Signed)
EDPA and family at [facility], no changes.

## 2022-09-24 NOTE — ED Provider Notes (Signed)
[facility] AT [facility] Provider Note   CSN: [id number] Arrival date & time: 09/24/22  4696     History  Chief Complaint  Patient presents with   Marletta Lor    Dominique Farley is a 89 y.o. female.   Fall   89 year old female presents emergency department after fall.  Patient states the fall occurred this morning when she was getting up to use the restroom.  States that she went to sit back down on her bed and missed the edge of her bed falling on her right side.  States that she was on the ground for an unknown amount of time but per daughter, fall could have been hours before as the blood on the carpet beside the patient was dry.  Fall did occur at some point this morning though.  Her daughters called her at 74 and subsequently 20 to 30 minutes later and with no response by the patient was made, they came to the patient's house and found her on the ground.  Patient currently complaining of no pain.  Was able to ambulate to the stretcher for EMS.  Patient is on Coumadin for history of atrial fibrillation has been compliant with her medications.  Denies any chest pain, shortness of breath, abdominal pain, nausea, vomiting.  Denies any visual disturbance, weakness/sensory deficits in upper lower extremities from baseline, gait abnormality from baseline, facial droop.  Past medical history significant for CVA, tremor, hypertension, skin cancer, atrial fibrillation on Coumadin  Home Medications Prior to Admission medications   Medication Sig Start Date End Date Taking? Authorizing Provider  amLODipine (NORVASC) 5 MG tablet TAKE 1 AND 1/2 TABLETS BY MOUTH DAILY 08/19/22   Delynn Flavin M, DO  aspirin EC 81 MG tablet Take 81 mg by mouth 3 (three) times a week.    [provider]  atorvastatin (LIPITOR) 40 MG tablet TAKE 1 TABLET BY MOUTH AT BEDTIME 09/21/22   Delynn Flavin M, DO  clobetasol cream (TEMOVATE) 0.05 % APPLY TO THE AFFECTED AREA(S) TWICE DAILY  UNTIL PSORIATIC LESION RESOLVED 08/19/22   Delynn Flavin M, DO  lisinopril (ZESTRIL) 30 MG tablet TAKE 1 TABLET BY MOUTH EVERY DAY 08/19/22   Delynn Flavin M, DO  polyethylene glycol (MIRALAX / GLYCOLAX) 17 g packet Take 17 g by mouth daily.    [provider]  primidone (MYSOLINE) 50 MG tablet TAKE 1 AND 1/2 TABLETS BY MOUTH THREE TIMES DAILY 07/21/22   Delynn Flavin M, DO  warfarin (COUMADIN) 5 MG tablet Take 2 tablets Tues/ Friday and 1.5 tablets daily all other days 07/21/22   [name], DO      Allergies    Codeine, Influenza vaccines, Remeron [mirtazapine], and Sulfa antibiotics    Review of Systems   Review of Systems  All other systems reviewed and are negative.   Physical Exam Updated Vital Signs BP (!) 119/47 (BP Location: Left Leg)   Pulse 86   Temp 98.7 F (37.1 C) (Oral)   Resp 18   Ht 5' (1.524 m)   Wt 63 kg   SpO2 98%   BMI 27.15 kg/m  Physical Exam Vitals and nursing note reviewed.  Constitutional:      General: She is not in acute distress.    Appearance: She is well-developed.  HENT:     Head: Normocephalic.     Comments: Patient with large region of hematoma appreciated on entirety of the right side of her face.  No obvious palpable  tenderness or deformity. Eyes:     Conjunctiva/sclera: Conjunctivae normal.  Cardiovascular:     Rate and Rhythm: Normal rate and regular rhythm.     Heart sounds: Murmur heard.  Pulmonary:     Effort: Pulmonary effort is normal. No respiratory distress.     Breath sounds: Normal breath sounds. No wheezing, rhonchi or rales.  Chest:     Chest wall: No tenderness.  Abdominal:     Palpations: Abdomen is soft.     Tenderness: There is no abdominal tenderness. There is no guarding.  Musculoskeletal:     Cervical back: Neck supple.     Comments: Patient with ecchymosis present on the dorsal aspect of right hand with extension into digits.  Deformity appreciated on right 3-5 digits but patient states  that this is more chronic than acute.  Patient also with large skin tear appreciated just distal to right knee; see images below.  No midline tenderness of cervical, thoracic, lumbar spine with no step-off or deformity noted.  No chest wall tenderness to palpation.  Some tenderness over skin tear of right knee but otherwise, no tenderness to the bony aspects of patient's upper or lower extremities.  Skin:    General: Skin is warm and dry.     Capillary Refill: Capillary refill takes less than 2 seconds.  Neurological:     Mental Status: She is alert.     Comments: Alert and oriented to self, place, and event.   Speech is fluent, clear without dysarthria or dysphasia.   Strength symmetric in upper/lower extremities   Sensation intact in upper/lower extremities   CN I not tested  CN II not tested CN III, IV, VI PERRLA and EOMs intact bilaterally  CN V Intact sensation to sharp and light touch to the face  CN VII facial movements symmetric  CN VIII not tested  CN IX, X no uvula deviation, symmetric rise of soft palate  CN XI  SCM and trapezius symmetric strength bilaterally  CN XII Midline tongue protrusion, symmetric L/R movements     Psychiatric:        Mood and Affect: Mood normal.     ED Results / Procedures / Treatments   Labs (all labs ordered are listed, but only abnormal results are displayed) Labs Reviewed  PROTIME-INR - Abnormal; Notable for the following components:      Result Value   Prothrombin Time 26.3 (*)    INR 2.4 (*)    All other components within normal limits  CBC - Abnormal; Notable for the following components:   WBC 10.7 (*)    RBC 3.34 (*)    Hemoglobin 10.6 (*)    HCT 32.4 (*)    All other components within normal limits  BASIC METABOLIC PANEL - Abnormal; Notable for the following components:   Sodium 132 (*)    Glucose, Bld 125 (*)    Calcium 7.9 (*)    All other components within normal limits  CK  POC OCCULT BLOOD, ED     EKG None  Radiology DG Hand Complete Right  Result Date: 09/24/2022 CLINICAL DATA:  Displaced fractures of the third through fifth proximal phalanges status post reduction EXAM: RIGHT HAND - COMPLETE 3 VIEW COMPARISON:  Right hand radiograph dated 09/24/2022 FINDINGS: Similar alignment of mildly displaced fractures of third through fifth proximal phalanges status post interval splint placement. Diffuse degenerative changes of the hand, most pronounced in the interphalangeal joints and first carpometacarpal joint. IMPRESSION: Similar alignment of  mildly displaced fractures of the third through fifth proximal phalanges status post splint placement. Electronically Signed   By: Agustin Cree M.D.   On: 09/24/2022 15:04   DG Pelvis 1-2 Views  Result Date: 09/24/2022 CLINICAL DATA:  pain EXAM: PELVIS - 1-2 VIEW COMPARISON:  None Available. FINDINGS: No acute displaced fracture or dislocation of either hips on frontal view. There is no evidence of pelvic fracture or diastasis. No pelvic bone lesions are seen. Vascular calcification. IMPRESSION: 1. Negative for acute traumatic injury. 2.  Aortic Atherosclerosis (ICD10-I70.0). Electronically Signed   By: Tish Frederickson M.D.   On: 09/24/2022 11:26   DG Chest 1 View  Result Date: 09/24/2022 CLINICAL DATA:  pain EXAM: CHEST  1 VIEW COMPARISON:  None Available. FINDINGS: Prominent cardiac silhouette possibly due to AP portable technique. Otherwise the heart and mediastinal contours are within normal limits. Aortic calcification. No focal consolidation. No pulmonary edema. Trace left pleural effusion. No pneumothorax. No acute osseous abnormality. IMPRESSION: Trace left pleural effusion. Electronically Signed   By: Tish Frederickson M.D.   On: 09/24/2022 11:24   DG Hand Complete Right  Result Date: 09/24/2022 CLINICAL DATA:  Pain unwitnessed fall, on coumadin. Bruising noted on right hand and right side of face. Right knee pain and discoloration EXAM: RIGHT  HAND - COMPLETE 3+ VIEW COMPARISON:  None Available. FINDINGS: Acute displaced third, fourth, fifth proximal phalangeal fractures. The fourth digit proximal phalangeal body fracture extends to the base of the phalanx and may be intra-articular. Severe proximal and distal interphalangeal joint degenerative changes. Severe first digit carpometacarpal joint degenerative changes. Ulnocarpal degenerative changes with calcification of the TFCC. Soft tissues are unremarkable. IMPRESSION: Acute displaced third, fourth, fifth proximal phalangeal fractures. The fourth digit proximal phalangeal body fracture extends to the base of the phalanx and may be intra-articular. Electronically Signed   By: Tish Frederickson M.D.   On: 09/24/2022 11:23   DG Knee Complete 4 Views Right  Result Date: 09/24/2022 CLINICAL DATA:  pain EXAM: RIGHT KNEE - COMPLETE 4+ VIEW COMPARISON:  None Available. FINDINGS: No evidence of fracture, dislocation, or joint effusion. Severe tricompartmental degenerative changes of the knee. Soft tissues are unremarkable. IMPRESSION: No acute displaced fracture or dislocation. Electronically Signed   By: Tish Frederickson M.D.   On: 09/24/2022 11:21   CT Head Wo Contrast  Result Date: 09/24/2022 CLINICAL DATA:  Head trauma, minor (Age >= 65y); Neck trauma (Age >= 65y); Facial trauma, blunt Fall.  History stroke. EXAM: CT HEAD WITHOUT CONTRAST CT MAXILLOFACIAL WITHOUT CONTRAST CT CERVICAL SPINE WITHOUT CONTRAST TECHNIQUE: Multidetector CT imaging of the head, cervical spine, and maxillofacial structures were performed using the standard protocol without intravenous contrast. Multiplanar CT image reconstructions of the cervical spine and maxillofacial structures were also generated. RADIATION DOSE REDUCTION: This exam was performed according to the departmental dose-optimization program which includes automated exposure control, adjustment of the mA and/or kV according to patient size and/or use of iterative  reconstruction technique. COMPARISON:  CT head and C-spine 02/26/2022 FINDINGS: CT HEAD FINDINGS Brain: Left basal ganglia, insular region encephalomalacia. No evidence of large-territorial acute infarction. No parenchymal hemorrhage. No mass lesion. No extra-axial collection. No mass effect or midline shift. No hydrocephalus. Basilar cisterns are patent. Vascular: No hyperdense vessel. Atherosclerotic calcifications are present within the cavernous internal carotid arteries. Skull: No acute fracture or focal lesion. Other: None. CT MAXILLOFACIAL FINDINGS Osseous: No fracture or mandibular dislocation. No destructive process. Patient is edentulous. Sinuses/Orbits: Right maxillary sinus mucosal thickening. Paranasal  sinuses and mastoid air cells are clear. The orbits are unremarkable. Soft tissues: Negative. CT CERVICAL SPINE FINDINGS Alignment: Normal. Skull base and vertebrae: Multilevel mild-to-moderate degenerative changes spine. No associated severe osseous neural foraminal or central canal stenosis. No acute fracture. No aggressive appearing focal osseous lesion or focal pathologic process. Soft tissues and spinal canal: No prevertebral fluid or swelling. No visible canal hematoma. Upper chest: Unremarkable. Other: None. IMPRESSION: 1. No acute intracranial abnormality. 2.  No acute displaced facial fracture. 3. No acute displaced fracture or traumatic listhesis of the cervical spine. Electronically Signed   By: Tish Frederickson M.D.   On: 09/24/2022 11:12   CT Cervical Spine Wo Contrast  Result Date: 09/24/2022 CLINICAL DATA:  Head trauma, minor (Age >= 65y); Neck trauma (Age >= 65y); Facial trauma, blunt Fall.  History stroke. EXAM: CT HEAD WITHOUT CONTRAST CT MAXILLOFACIAL WITHOUT CONTRAST CT CERVICAL SPINE WITHOUT CONTRAST TECHNIQUE: Multidetector CT imaging of the head, cervical spine, and maxillofacial structures were performed using the standard protocol without intravenous contrast. Multiplanar CT  image reconstructions of the cervical spine and maxillofacial structures were also generated. RADIATION DOSE REDUCTION: This exam was performed according to the departmental dose-optimization program which includes automated exposure control, adjustment of the mA and/or kV according to patient size and/or use of iterative reconstruction technique. COMPARISON:  CT head and C-spine 02/26/2022 FINDINGS: CT HEAD FINDINGS Brain: Left basal ganglia, insular region encephalomalacia. No evidence of large-territorial acute infarction. No parenchymal hemorrhage. No mass lesion. No extra-axial collection. No mass effect or midline shift. No hydrocephalus. Basilar cisterns are patent. Vascular: No hyperdense vessel. Atherosclerotic calcifications are present within the cavernous internal carotid arteries. Skull: No acute fracture or focal lesion. Other: None. CT MAXILLOFACIAL FINDINGS Osseous: No fracture or mandibular dislocation. No destructive process. Patient is edentulous. Sinuses/Orbits: Right maxillary sinus mucosal thickening. Paranasal sinuses and mastoid air cells are clear. The orbits are unremarkable. Soft tissues: Negative. CT CERVICAL SPINE FINDINGS Alignment: Normal. Skull base and vertebrae: Multilevel mild-to-moderate degenerative changes spine. No associated severe osseous neural foraminal or central canal stenosis. No acute fracture. No aggressive appearing focal osseous lesion or focal pathologic process. Soft tissues and spinal canal: No prevertebral fluid or swelling. No visible canal hematoma. Upper chest: Unremarkable. Other: None. IMPRESSION: 1. No acute intracranial abnormality. 2.  No acute displaced facial fracture. 3. No acute displaced fracture or traumatic listhesis of the cervical spine. Electronically Signed   By: Tish Frederickson M.D.   On: 09/24/2022 11:12   CT Maxillofacial WO CM  Result Date: 09/24/2022 CLINICAL DATA:  Head trauma, minor (Age >= 65y); Neck trauma (Age >= 65y); Facial  trauma, blunt Fall.  History stroke. EXAM: CT HEAD WITHOUT CONTRAST CT MAXILLOFACIAL WITHOUT CONTRAST CT CERVICAL SPINE WITHOUT CONTRAST TECHNIQUE: Multidetector CT imaging of the head, cervical spine, and maxillofacial structures were performed using the standard protocol without intravenous contrast. Multiplanar CT image reconstructions of the cervical spine and maxillofacial structures were also generated. RADIATION DOSE REDUCTION: This exam was performed according to the departmental dose-optimization program which includes automated exposure control, adjustment of the mA and/or kV according to patient size and/or use of iterative reconstruction technique. COMPARISON:  CT head and C-spine 02/26/2022 FINDINGS: CT HEAD FINDINGS Brain: Left basal ganglia, insular region encephalomalacia. No evidence of large-territorial acute infarction. No parenchymal hemorrhage. No mass lesion. No extra-axial collection. No mass effect or midline shift. No hydrocephalus. Basilar cisterns are patent. Vascular: No hyperdense vessel. Atherosclerotic calcifications are present within the cavernous internal carotid arteries.  Skull: No acute fracture or focal lesion. Other: None. CT MAXILLOFACIAL FINDINGS Osseous: No fracture or mandibular dislocation. No destructive process. Patient is edentulous. Sinuses/Orbits: Right maxillary sinus mucosal thickening. Paranasal sinuses and mastoid air cells are clear. The orbits are unremarkable. Soft tissues: Negative. CT CERVICAL SPINE FINDINGS Alignment: Normal. Skull base and vertebrae: Multilevel mild-to-moderate degenerative changes spine. No associated severe osseous neural foraminal or central canal stenosis. No acute fracture. No aggressive appearing focal osseous lesion or focal pathologic process. Soft tissues and spinal canal: No prevertebral fluid or swelling. No visible canal hematoma. Upper chest: Unremarkable. Other: None. IMPRESSION: 1. No acute intracranial abnormality. 2.  No  acute displaced facial fracture. 3. No acute displaced fracture or traumatic listhesis of the cervical spine. Electronically Signed   By: Tish Frederickson M.D.   On: 09/24/2022 11:12    Procedures .Ortho Injury Treatment  Date/Time: 09/24/2022 2:58 PM  Performed by: Peter Garter, PA Authorized by: Peter Garter, PA   Consent:    Consent obtained:  Verbal   Consent given by:  Patient   Risks discussed:  Fracture, irreducible dislocation, recurrent dislocation, nerve damage, restricted joint movement and stiffness   Alternatives discussed:  No treatment and delayed treatmentInjury location: finger Location details: right ring finger Injury type: fracture Fracture type: proximal phalanx MCP joint involved: yes IP joint involved: no Pre-procedure distal perfusion: normal Pre-procedure neurological function: normal Pre-procedure range of motion: reduced Anesthesia: hematoma block  Anesthesia: Local anesthesia used: yes Local Anesthetic: lidocaine 1% without epinephrine Anesthetic total: 5 mL  Patient sedated: NoManipulation performed: yes Skin traction used: yes Skeletal traction used: yes Reduction successful: no X-ray confirmed reduction: yes Immobilization: splint Splint type: ulnar gutter Splint Applied by: ED Nurse Supplies used: Ortho-Glass and cotton padding Post-procedure distal perfusion: normal Post-procedure neurological function: normal Post-procedure range of motion: improved Comments: Patient's finger was attempted to be reduced which would temporarily remain in place this seemed to become more displaced whenever traction was taken away.  Finger attempted to be reduced and appeared more aligned to the eye for repeat x-ray appears no real improvement of alignment of fracture fragment.       Medications Ordered in ED Medications  lidocaine-EPINEPHrine-tetracaine (LET) topical gel (6 mLs Topical Given 09/24/22 1120)  lidocaine (PF) (XYLOCAINE) 1 %  injection 10 mL (10 mLs Other Given by Other 09/24/22 1321)    ED Course/ Medical Decision Making/ A&P Clinical Course as of 09/24/22 1747  Thu Sep 24, 2022  6153 89 year old female brought in for unwitnessed fall where she hit her head.  She is on blood thinners.  She is getting imaging.  Disposition per results of testing. [MB]  1244 Consulted orthopedics Dr. Dallas Schimke regarding the patient.  Recommended ulnar gutter splint and follow-up next week for reassessment. [CR]    Clinical Course User Index [CR] Peter Garter, PA [MB] Terrilee Files, MD                                 Medical Decision Making Amount and/or Complexity of Data Reviewed Labs: ordered. Radiology: ordered.  Risk Prescription drug management.   This patient presents to the ED for concern of fall, this involves an extensive number of treatment options, and is a complaint that carries with it a high risk of complications and morbidity.  The differential diagnosis includes CVA, fracture, strain/pain, dislocation, ligamentous/tendinous injury, neurovascular compromise   Co morbidities that complicate  the patient evaluation  See HPI   Additional history obtained:  Additional history obtained from EMR External records from outside source obtained and reviewed including hospital records   Lab Tests:  I Ordered, and personally interpreted labs.  The pertinent results include: Hyponatremia of 132, hyperglycemia of 7.9 otherwise, lites within normal limits.  No renal dysfunction.  Mild leukocytosis at 10.7.  No evidence of anemia with hemoglobin 10.6 of which is decreased from hemoglobin 6 months ago of 12 and normocytic in nature.  INR within therapeutic range of 2.4.  CK within normal limits.   Imaging Studies ordered:  I ordered imaging studies including CT head/cervical spine/maxillofacial, right knee x-ray, right hand x-ray, chest x-ray, pelvis x-ray I independently visualized and interpreted imaging  which showed  CT head/cervical spine/maxillofacial: No acute intracranial abnormality.  No acute displaced facial fracture.  No acute displaced fracture or traumatic listhesis of cervical spine. Right knee x-ray: No osseous abnormality Right hand x-ray: Acute displaced third fourth and fifth proximal phalangeal fractures. Chest x-ray: Trace left pleural effusion. Pelvis x-ray: No acute osseous abnormality I agree with the radiologist interpretation  Cardiac Monitoring: / EKG:  The patient was maintained on a cardiac monitor.  I personally viewed and interpreted the cardiac monitored which showed an underlying rhythm of: Irregular irregular rhythm with normal rate   Consultations Obtained:  See ED course  Problem List / ED Course / Critical interventions / Medication management  Fall, right hand fracture I ordered medication including lidocaine, let gel  Reevaluation of the patient after these medicines showed that the patient improved I have reviewed the patients home medicines and have made adjustments as needed   Social Determinants of Health:  Tobacco, illicit drug use/exposure   Test / Admission - Considered:  Fall, right hand fracture Vitals signs within normal range and stable throughout visit. Laboratory/imaging studies significant for: See above 89 year old female presents emergency department after mechanical fall earlier today after missing the seat getting back into her bed.  On exam, patient reproducible tenderness of the right side of her face, right knee, right hand.  Imaging studies were obtained of areas which did show acute fractures of multiple digits of her right hand but otherwise unremarkable for any acute traumatic injury.  Given unknown amount of time on ground, laboratory studies were performed which were reassuring.  CK within normal limits.  No evidence of renal dysfunction.  Patient was with evidence of anemia with a hemoglobin of 10.6 up which seems to  be decreased from 6 months ago of 12.2.  Advised patient regarding obtaining Hemoccult for assessment of GI source but she adamantly declined.  Stating that she just wanted to have her hand addressed and be discharged to follow-up with her primary care.  Regarding right hand, attempted reduction of fracture of fourth digit was made with some improvement via palpation and visually.  Patient was discharged in splint with repeat x-ray pending.  Repeat x-ray did show minimal to no improvement of fracture.  On repeat assessment, finger seem to be back to the way it was before reduction occurred.  Again offered reduction but patient declined due to wanting to go home.  Will recommend close follow-up with orthopedics in the outpatient setting and keeping splint in place until then.  Symptomatic therapy recommended as described in AVS. Worrisome signs and symptoms were discussed with the patient, and the patient acknowledged understanding to return to the ED if noticed. Patient was stable upon discharge.  Final Clinical Impression(s) / ED Diagnoses Final diagnoses:  Fall, initial encounter  Displaced fracture of proximal phalanx of right index finger, initial encounter for closed fracture  Fracture of proximal phalanx of finger of right hand    Rx / DC Orders ED Discharge Orders     None         Peter Garter, [place] 09/24/22 1747    Terrilee Files, MD 09/25/22 1733

## 2022-09-24 NOTE — ED Notes (Signed)
Alert, NAD, calm, interactive, tracking. R face bruised.

## 2022-09-25 ENCOUNTER — Encounter: Payer: Self-pay | Admitting: Family Medicine

## 2022-09-30 ENCOUNTER — Ambulatory Visit: Payer: Medicare Other | Admitting: Orthopedic Surgery

## 2022-09-30 ENCOUNTER — Encounter: Payer: Self-pay | Admitting: Orthopedic Surgery

## 2022-09-30 DIAGNOSIS — S81811A Laceration without foreign body, right lower leg, initial encounter: Secondary | ICD-10-CM

## 2022-09-30 DIAGNOSIS — S62614A Displaced fracture of proximal phalanx of right ring finger, initial encounter for closed fracture: Secondary | ICD-10-CM

## 2022-09-30 DIAGNOSIS — S62616A Displaced fracture of proximal phalanx of right little finger, initial encounter for closed fracture: Secondary | ICD-10-CM | POA: Diagnosis not present

## 2022-09-30 DIAGNOSIS — S62612A Displaced fracture of proximal phalanx of right middle finger, initial encounter for closed fracture: Secondary | ICD-10-CM | POA: Diagnosis not present

## 2022-09-30 NOTE — Patient Instructions (Signed)

## 2022-10-01 NOTE — Progress Notes (Signed)
New Patient Visit  Assessment: Dominique Farley is a 89 y.o. female with the following: 1. Closed displaced fracture of proximal phalanx of right little finger, initial encounter 2. Closed displaced fracture of proximal phalanx of right ring finger, initial encounter 3. Closed displaced fracture of proximal phalanx of right middle finger, initial encounter 4. Right lower leg skin tear   Plan: Magda Kiel sustained minimally displaced fractures of the right long, ring and small fingers.  She was transitioned to a short arm cast today.  Elevate to help with swelling.    Skin tear on right leg is healthy appearing.  Continue to gently cleanse the leg and replace dressing daily.  Ok to leave open to the air.   Cast application - Right short arm cast   Verbal consent was obtained and the correct extremity was identified. A well padded, appropriately molded short arm cast was applied to the Right arm Fingers remained warm and well perfused.   There were no sharp edges Patient tolerated the procedure well Cast care instructions were provided    Follow-up: Return in about 2 weeks (around 10/14/2022).  Subjective:  Chief Complaint  Patient presents with   Fracture    R hand DOI 09/24/22    History of Present Illness: Dominique Farley is a 89 y.o. female who presents for evaluation of right hand pain.  She lives at home and has assistance from family and aides.  She got up to the bathroom and fell.  She was complaining of pain in her right hand and had a skin tear on the lower leg.  She was placed in a short arm cast that did not immobilize the fingers.  Her pain is controlled.  Daily dressing changes on her leg.    Review of Systems: No fevers or chills No numbness or tingling No chest pain No shortness of breath No bowel or bladder dysfunction No GI distress No headaches   Medical History:  Past Medical History:  Diagnosis Date   Acute cystitis with hematuria 11/22/2020   Atrial  fibrillation (HCC)    BCC (basal cell carcinoma of skin) 11/14/2004   Right Mid Cheek(Cx3,5FU)   BCC (basal cell carcinoma of skin) 04/16/2011   Right Crease(tx p bx)   BCC (basal cell carcinoma of skin) 06/16/2016   Left Nare(tx p bx)   BCC (basal cell carcinoma of skin) Atypical Basaloid 09/29/2018   Left Side Nose(tx p bx)   Hypertension    Osteoporosis    Pneumonia of lower lobe due to infectious organism 11/20/2020   SCC (squamous cell carcinoma) 09/29/2018   Left Jawline(tx p bx)   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   Right Jawline(Cx3,5FU) and Left Upper Cheek(Cx3,5FU)   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   Left Jawline(tx p bx), Nose(tx p bx), Right Cheek(tx p bx), and Right Forearm(tx p bx)   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   Right Temple(Cx3,5FU) and Left Outer Eye(Cx3,5FU)   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   Right Forearm(Cx3,5FU)   Squamous cell carcinoma in situ (SCCIS) 03/13/2005   Left Side of Nose(Cx3,5FU)   Squamous cell carcinoma in situ (SCCIS) 06/04/2005   Inner Right Cheek/Eye(Tx p Bx)   Squamous cell carcinoma in situ (SCCIS) 02/12/2010   Right Cheek(tx p bx)   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 2 09/29/2018   Upper Lip(tx p bx) and Bridge of Nose(tx p bx)   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   Left Forehead, Left Neck, Right Nose, and Right Hand   Stroke [facility])    Tremor    Weakness 11/20/2020    Past Surgical History:  Procedure Laterality Date   CHOLECYSTECTOMY     skin biopsies      Family History  Problem Relation Age of Onset   Dementia Mother    Stroke Father    Heart disease Father    Diabetes Sister    Stroke Son    Social History   Tobacco Use   Smoking status: Never   Smokeless tobacco: Never  Vaping Use   Vaping status: Never Used  Substance Use Topics   Alcohol use: Never   Drug use: Never    Allergies  Allergen Reactions   Codeine  Other (See Comments)   Influenza Vaccines    Remeron [Mirtazapine] Other (See Comments)    Dizziness    Sulfa Antibiotics Hives    Current Meds  Medication Sig   amLODipine (NORVASC) 5 MG tablet TAKE 1 AND 1/2 TABLETS BY MOUTH DAILY   aspirin EC 81 MG tablet Take 81 mg by mouth 3 (three) times a week.   atorvastatin (LIPITOR) 40 MG tablet TAKE 1 TABLET BY MOUTH AT BEDTIME   lisinopril (ZESTRIL) 30 MG tablet TAKE 1 TABLET BY MOUTH EVERY DAY   primidone (MYSOLINE) 50 MG tablet TAKE 1 AND 1/2 TABLETS BY MOUTH THREE TIMES DAILY   warfarin (COUMADIN) 5 MG tablet Take 2 tablets Tues/ Friday and 1.5 tablets daily all other days (Patient taking differently: Take 1.5 tablets daily all other days)   Current Facility-Administered Medications for the 09/30/22 encounter (Office Visit) with Oliver Barre, MD  Medication   cefTRIAXone (ROCEPHIN) injection 1 g    Objective: There were no vitals taken for this visit.  Physical Exam:  General: Elderly female., Alert and oriented., No acute distress., and Seated in a wheelchair.   Right hand with mild deformity to fingers.  Diffuse swelling.  Ecchymosis.  Sensation intact.  No active bleeding  Large skin tear and avulsion to right lower leg.  No active bleeding.  No concern for an infection    IMAGING: I personally reviewed images previously obtained from the ED  Minimally displaced fractures of the right long, ring and small fingers.    New Medications:  No orders of the defined types were placed in this encounter.     Oliver Barre, MD  10/01/2022 8:54 AM

## 2022-10-07 ENCOUNTER — Encounter: Payer: Self-pay | Admitting: Family Medicine

## 2022-10-07 ENCOUNTER — Ambulatory Visit: Payer: Medicare Other | Admitting: Family Medicine

## 2022-10-07 VITALS — BP 126/82 | Temp 98.6°F | Ht 60.0 in

## 2022-10-07 DIAGNOSIS — W19XXXD Unspecified fall, subsequent encounter: Secondary | ICD-10-CM | POA: Diagnosis not present

## 2022-10-07 DIAGNOSIS — Z5181 Encounter for therapeutic drug level monitoring: Secondary | ICD-10-CM

## 2022-10-07 DIAGNOSIS — R791 Abnormal coagulation profile: Secondary | ICD-10-CM

## 2022-10-07 DIAGNOSIS — Z7901 Long term (current) use of anticoagulants: Secondary | ICD-10-CM | POA: Diagnosis not present

## 2022-10-07 DIAGNOSIS — I4811 Longstanding persistent atrial fibrillation: Secondary | ICD-10-CM

## 2022-10-07 LAB — COAGUCHEK XS/INR WAIVED
INR: 3.8 — ABNORMAL HIGH (ref 0.9–1.1)
Prothrombin Time: 45.6 s

## 2022-10-07 NOTE — Progress Notes (Signed)
Subjective: CC:INR check PCP: [name], DO ZOX:WRUE Parady is a 89 y.o. female presenting to clinic today for:  1. Afib  Goal INR 2-3 Since her last visit patient sustained a fall where she fractured several fingers on her right hand.  She is accompanied today by one of her caregivers and her daughter Kathie Rhodes.  She admits that she has not had as good of an appetite since the fall but still does eat.  No changes in medications including no addition of antibiotics etc.  She will be seeing her orthopedist again this Friday.  Currently casted.  Having some swelling at the top of her arm.  Right calf seems to be getting somewhat better.  They are keeping that wrapped as well.  Would like to have CBC checked given drop noted in the ER.  Denies any rectal bleeding, vaginal bleeding or hematuria.   ROS: Per HPI  Allergies  Allergen Reactions   Codeine Other (See Comments)   Influenza Vaccines    Remeron [Mirtazapine] Other (See Comments)    Dizziness    Sulfa Antibiotics Hives   Past Medical History:  Diagnosis Date   Acute cystitis with hematuria 11/22/2020   Atrial fibrillation (HCC)    BCC (basal cell carcinoma of skin) 11/14/2004   Right Mid Cheek(Cx3,5FU)   BCC (basal cell carcinoma of skin) 04/16/2011   Right Crease(tx p bx)   BCC (basal cell carcinoma of skin) 06/16/2016   Left Nare(tx p bx)   BCC (basal cell carcinoma of skin) Atypical Basaloid 09/29/2018   Left Side Nose(tx p bx)   Hypertension    Osteoporosis    Pneumonia of lower lobe due to infectious organism 11/20/2020   SCC (squamous cell carcinoma) 09/29/2018   Left Jawline(tx p bx)   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   Right Jawline(Cx3,5FU) and Left Upper Cheek(Cx3,5FU)   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   Left Jawline(tx p bx), Nose(tx p bx), Right Cheek(tx p bx), and Right Forearm(tx p bx)   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   Right Temple(Cx3,5FU) and Left  Outer Eye(Cx3,5FU)   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   Right Forearm(Cx3,5FU)   Squamous cell carcinoma in situ (SCCIS) 03/13/2005   Left Side of Nose(Cx3,5FU)   Squamous cell carcinoma in situ (SCCIS) 06/04/2005   Inner Right Cheek/Eye(Tx p Bx)   Squamous cell carcinoma in situ (SCCIS) 02/12/2010   Right Cheek(tx p bx)   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 2 09/29/2018   Upper Lip(tx p bx) and Bridge of Nose(tx p bx)   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   Left Forehead, Left Neck, Right Nose, and Right Hand   Stroke (HCC)    Tremor    Weakness 11/20/2020    Current Outpatient Medications:    amLODipine (NORVASC) 5 MG tablet, TAKE 1 AND 1/2 TABLETS BY MOUTH DAILY, Disp: 135 tablet, Rfl: 0   aspirin EC 81 MG tablet, Take 81 mg by mouth 3 (three) times a week., Disp: , Rfl:    atorvastatin (LIPITOR) 40 MG tablet, TAKE 1 TABLET BY MOUTH AT BEDTIME, Disp: 30 tablet, Rfl: 0   clobetasol cream (TEMOVATE) 0.05 %, APPLY TO THE AFFECTED AREA(S) TWICE DAILY UNTIL PSORIATIC LESION RESOLVED, Disp: 120 g, Rfl: 2   lisinopril (ZESTRIL) 30 MG tablet, TAKE 1 TABLET BY MOUTH EVERY DAY, Disp: 90 tablet, Rfl: 0   polyethylene glycol (MIRALAX /  GLYCOLAX) 17 g packet, Take 17 g by mouth daily., Disp: , Rfl:    primidone (MYSOLINE) 50 MG tablet, TAKE 1 AND 1/2 TABLETS BY MOUTH THREE TIMES DAILY, Disp: 405 tablet, Rfl: 0   warfarin (COUMADIN) 5 MG tablet, Take 2 tablets Tues/ Friday and 1.5 tablets daily all other days (Patient taking differently: Take 1.5 tablets daily all other days), Disp: 135 tablet, Rfl: 3  Current Facility-Administered Medications:    cefTRIAXone (ROCEPHIN) injection 1 g, 1 g, Intramuscular, Once, [name], DO Social History   Socioeconomic History   Marital status: Widowed    Spouse name: Not on file   Number of children: 3   Years of education: Not on file   Highest education level: Not on file  Occupational History    Occupation: Retired  Tobacco Use   Smoking status: Never   Smokeless tobacco: Never  Vaping Use   Vaping status: Never Used  Substance and Sexual Activity   Alcohol use: Never   Drug use: Never   Sexual activity: Not Currently  Other Topics Concern   Not on file  Social History Narrative   Not on file   Social Determinants of Health   Financial Resource Strain: Low Risk  (11/21/2020)   Received from [facility], [facility]   Overall Financial Resource Strain (CARDIA)    Difficulty of Paying Living Expenses: Not hard at all  Food Insecurity: No Food Insecurity (11/21/2020)   Received from [facility], [facility]   Hunger Vital Sign    Worried About Running Out of Food in the Last Year: Never true    Ran Out of Food in the Last Year: Never true  Transportation Needs: No Transportation Needs (11/21/2020)   Received from [facility], [facility]   [facility] - Transportation    Lack of Transportation (Medical): No    Lack of Transportation (Non-Medical): No  Physical Activity: Not on file  Stress: Not on file  Social Connections: Unknown (05/27/2021)   Received from [facility], [facility]   Social Network    Social Network: Not on file  Intimate Partner Violence: Unknown (04/18/2021)   Received from [facility], [facility]   HITS    Physically Hurt: Not on file    Insult or Talk Down To: Not on file    Threaten Physical Harm: Not on file    Scream or Curse: Not on file   Family History  Problem Relation Age of Onset   Dementia Mother    Stroke Father    Heart disease Father    Diabetes Sister    Stroke Son     Objective: Office vital signs reviewed. BP 126/82   Temp 98.6 F (37 C)   Ht 5' (1.524 m)   SpO2 90%   BMI 27.15 kg/m   Physical Examination:  General: Awake, alert, well nourished, No acute distress HEENT: sclera white, MMM Cardio: regular rate and rhythm, S1S2 heard, no murmurs appreciated Pulm: clear to  auscultation bilaterally, no wheezes, rhonchi or rales; normal work of breathing on room air MSK: Arrives in wheelchair.  Right calf is wrapped.  Right forearm and hand/wrist in a cast.  She has brisk capillary refill.  There is quite a bit of ecchymosis and fluid noted along the proximal forearm on the right just above the cast but this is nontender and there is no active bleeding appreciated.  Assessment/ Plan: 89 y.o. female   Supratherapeutic  INR  Longstanding persistent atrial fibrillation (HCC) - Plan: CoaguChek XS/INR Waived, CBC  Anticoagulation goal of INR 2 to 3 - Plan: CoaguChek XS/INR Waived, CBC  Fall, subsequent encounter - Plan: CBC  INR supratherapeutic today at 3.8.  She will skip today 7.5 mg dose and reduce tomorrow's a 7.5 mg dose to 5 mg only.  She may then resume normal dosing schedule.  I will see her back in 2 weeks for INR recheck, sooner if concerns arise.  Encouraged consumption of greens over the next day or so.   [name], DO [facility] [phone]

## 2022-10-08 LAB — CBC
Hematocrit: 30.5 % — ABNORMAL LOW (ref 34.0–46.6)
Hemoglobin: 9.9 g/dL — ABNORMAL LOW (ref 11.1–15.9)
MCH: 31.6 pg (ref 26.6–33.0)
MCHC: 32.5 g/dL (ref 31.5–35.7)
MCV: 97 fL (ref 79–97)
Platelets: 693 10*3/uL — ABNORMAL HIGH (ref 150–450)
RBC: 3.13 x10E6/uL — ABNORMAL LOW (ref 3.77–5.28)
RDW: 13 % (ref 11.7–15.4)
WBC: 15.2 10*3/uL — ABNORMAL HIGH (ref 3.4–10.8)

## 2022-10-14 ENCOUNTER — Encounter: Payer: Self-pay | Admitting: Orthopedic Surgery

## 2022-10-14 ENCOUNTER — Ambulatory Visit (INDEPENDENT_AMBULATORY_CARE_PROVIDER_SITE_OTHER): Payer: Medicare Other | Admitting: Orthopedic Surgery

## 2022-10-14 ENCOUNTER — Other Ambulatory Visit (INDEPENDENT_AMBULATORY_CARE_PROVIDER_SITE_OTHER): Payer: Medicare Other

## 2022-10-14 DIAGNOSIS — S62616A Displaced fracture of proximal phalanx of right little finger, initial encounter for closed fracture: Secondary | ICD-10-CM

## 2022-10-14 DIAGNOSIS — S62616D Displaced fracture of proximal phalanx of right little finger, subsequent encounter for fracture with routine healing: Secondary | ICD-10-CM

## 2022-10-14 DIAGNOSIS — S62614A Displaced fracture of proximal phalanx of right ring finger, initial encounter for closed fracture: Secondary | ICD-10-CM

## 2022-10-14 DIAGNOSIS — S62612D Displaced fracture of proximal phalanx of right middle finger, subsequent encounter for fracture with routine healing: Secondary | ICD-10-CM | POA: Diagnosis not present

## 2022-10-14 DIAGNOSIS — S62612A Displaced fracture of proximal phalanx of right middle finger, initial encounter for closed fracture: Secondary | ICD-10-CM

## 2022-10-14 DIAGNOSIS — S62614D Displaced fracture of proximal phalanx of right ring finger, subsequent encounter for fracture with routine healing: Secondary | ICD-10-CM | POA: Diagnosis not present

## 2022-10-14 NOTE — Progress Notes (Signed)
Return patient Visit  Assessment: Dominique Farley is a 89 y.o. female with the following: 1. Closed displaced fracture of proximal phalanx of right little finger, subsequent encounter 2. Closed displaced fracture of proximal phalanx of right ring finger, subsequent encounter 3. Closed displaced fracture of proximal phalanx of right middle finger, subsequent encounter 4. Right lower leg skin tear   Plan: Magda Kiel sustained minimally displaced fractures of the right long, ring and small fingers.  She has done well in her current cast.  Injury was sustained approximately 3 weeks ago, we will transition her to another cast today.  Follow-up in approximately 2 weeks.  In regards to the skin tear, it is very healthy appearing.  Dressing changes have been appropriate.  There is a small area that needs to continue to heal.  Continue dressing changes.  Follow-up in 2 weeks.  Cast application - Right short arm cast   Verbal consent was obtained and the correct extremity was identified. A well padded, appropriately molded short arm cast was applied to the Right arm Fingers remained warm and well perfused.   There were no sharp edges Patient tolerated the procedure well Cast care instructions were provided    Follow-up: No follow-ups on file.  Subjective:  Chief Complaint  Patient presents with   Fracture     R hand DOI 09/24/22      History of Present Illness: Shenica Holzheimer is a 89 y.o. female who turns for evaluation of right hand pain.  Approximate 3 weeks ago, she fell and sustained injuries to her right hand.  She has been in a cast.  She has tolerated this well.  She also sustained a skin tear.  Continue with regular dressing changes, cleaning the skin, and then covering with Xeroform and gauze.  Review of Systems: No fevers or chills No numbness or tingling No chest pain No shortness of breath No bowel or bladder dysfunction No GI distress No headaches   Objective: There were no  vitals taken for this visit.  Physical Exam:  General: Elderly female., Alert and oriented., No acute distress., and Seated in a wheelchair.   Right hand with persistent diffuse swelling.  There is some bruising.  Sensation is intact throughout the right hand.  Mild deformity appreciated to the long, ring and small fingers.  Large skin tear and avulsion to right lower leg.  No drainage.  At the lower portion, it is not dried.  Most of the skin tear has dried and is healing very well.    IMAGING: I personally ordered and reviewed the following images  X-rays of the right hand were obtained in clinic today.  Fractures of the proximal phalanx to the long, ring and small fingers are appreciated.  There is no appreciable difference in alignment.  No obvious callus formation.  Minimal displacement overall.  Mild ulnar deviation through the fractures.  Impression: Right long, ring and small finger proximal phalangeal fractures in stable alignment   New Medications:  No orders of the defined types were placed in this encounter.     Oliver Barre, MD  10/14/2022 2:05 PM

## 2022-10-19 ENCOUNTER — Ambulatory Visit (INDEPENDENT_AMBULATORY_CARE_PROVIDER_SITE_OTHER): Payer: Medicare Other | Admitting: Orthopedic Surgery

## 2022-10-19 ENCOUNTER — Other Ambulatory Visit: Payer: Self-pay | Admitting: Family Medicine

## 2022-10-19 ENCOUNTER — Encounter: Payer: Self-pay | Admitting: Orthopedic Surgery

## 2022-10-19 DIAGNOSIS — G250 Essential tremor: Secondary | ICD-10-CM

## 2022-10-19 DIAGNOSIS — I63512 Cerebral infarction due to unspecified occlusion or stenosis of left middle cerebral artery: Secondary | ICD-10-CM

## 2022-10-19 DIAGNOSIS — Z4789 Encounter for other orthopedic aftercare: Secondary | ICD-10-CM

## 2022-10-19 NOTE — Progress Notes (Signed)
Chief Complaint  Patient presents with   Joint Swelling    Has swelling right elbow, worked in today as nurse visit for cast change, but cast not problem. Right elbow painful swollen date of injury 09/24/22   Cast issue  89 year old female with multiple fractures in her hand presents with swelling above the cast  I took a picture of the swelling above the cast.  It was not tight there is no compartment syndrome the skin was still wrinkling  It looked like edema above the casted area  Encounter Diagnosis  Name Primary?   Cast discomfort Yes    Recommended bivalving see Dr. Karlton Lemon as scheduled

## 2022-10-21 ENCOUNTER — Ambulatory Visit (INDEPENDENT_AMBULATORY_CARE_PROVIDER_SITE_OTHER): Payer: Medicare Other | Admitting: Family Medicine

## 2022-10-21 ENCOUNTER — Encounter: Payer: Self-pay | Admitting: Family Medicine

## 2022-10-21 VITALS — BP 136/85 | Ht 62.0 in

## 2022-10-21 DIAGNOSIS — D649 Anemia, unspecified: Secondary | ICD-10-CM | POA: Diagnosis not present

## 2022-10-21 DIAGNOSIS — R3989 Other symptoms and signs involving the genitourinary system: Secondary | ICD-10-CM | POA: Diagnosis not present

## 2022-10-21 DIAGNOSIS — R791 Abnormal coagulation profile: Secondary | ICD-10-CM | POA: Diagnosis not present

## 2022-10-21 DIAGNOSIS — Z5181 Encounter for therapeutic drug level monitoring: Secondary | ICD-10-CM | POA: Diagnosis not present

## 2022-10-21 DIAGNOSIS — I4811 Longstanding persistent atrial fibrillation: Secondary | ICD-10-CM

## 2022-10-21 DIAGNOSIS — Z7901 Long term (current) use of anticoagulants: Secondary | ICD-10-CM | POA: Diagnosis not present

## 2022-10-21 LAB — URINALYSIS, ROUTINE W REFLEX MICROSCOPIC
Bilirubin, UA: NEGATIVE
Glucose, UA: NEGATIVE
Ketones, UA: NEGATIVE
Nitrite, UA: NEGATIVE
Specific Gravity, UA: 1.02 (ref 1.005–1.030)
Urobilinogen, Ur: 0.2 mg/dL (ref 0.2–1.0)
pH, UA: 5.5 (ref 5.0–7.5)

## 2022-10-21 MED ORDER — PHYTONADIONE 5 MG PO TABS
2.5000 mg | ORAL_TABLET | Freq: Once | ORAL | 0 refills | Status: AC
Start: 2022-10-21 — End: 2022-10-21

## 2022-10-21 NOTE — Progress Notes (Signed)
Subjective: CC: INR check PCP: [name], DO UJW:[id number] Brosious is a 89 y.o. female presenting to clinic today for:  1.  Atrial fibrillation Patient was noted to be supratherapeutic at 3.8 the last time.  She was advised to hold medication and reduce the following day's dose and resume normal daily dosing.  She is here for recheck.  She is accompanied by her caregiver and daughter.  Her daughter has noticed some scant light reddish discoloration in her pad and worried that maybe she has a UTI.  She has not had any altered mental status, complaint of dysuria, hematuria, nausea, vomiting or had any odor to the urine.  She denies any bleeding anywhere.  She has persistent bruising after having sustained a fall with fracture of the right forearm but this seems to be getting better   ROS: Per HPI  Allergies  Allergen Reactions   Codeine Other (See Comments)   Influenza Vaccines    Remeron [Mirtazapine] Other (See Comments)    Dizziness    Sulfa Antibiotics Hives   Past Medical History:  Diagnosis Date   Acute cystitis with hematuria 11/22/2020   Atrial fibrillation (HCC)    BCC (basal cell carcinoma of skin) 11/14/2004   Right Mid Cheek(Cx3,5FU)   BCC (basal cell carcinoma of skin) 04/16/2011   Right Crease(tx p bx)   BCC (basal cell carcinoma of skin) 06/16/2016   Left Nare(tx p bx)   BCC (basal cell carcinoma of skin) Atypical Basaloid 09/29/2018   Left Side Nose(tx p bx)   Hypertension    Osteoporosis    Pneumonia of lower lobe due to infectious organism 11/20/2020   SCC (squamous cell carcinoma) 09/29/2018   Left Jawline(tx p bx)   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   Right Jawline(Cx3,5FU) and Left Upper Cheek(Cx3,5FU)   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   Left Jawline(tx p bx), Nose(tx p bx), Right Cheek(tx p bx), and Right Forearm(tx p bx)   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   Right Temple(Cx3,5FU) and Left Outer  Eye(Cx3,5FU)   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   Right Forearm(Cx3,5FU)   Squamous cell carcinoma in situ (SCCIS) 03/13/2005   Left Side of Nose(Cx3,5FU)   Squamous cell carcinoma in situ (SCCIS) 06/04/2005   Inner Right Cheek/Eye(Tx p Bx)   Squamous cell carcinoma in situ (SCCIS) 02/12/2010   Right Cheek(tx p bx)   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 2 09/29/2018   Upper Lip(tx p bx) and Bridge of Nose(tx p bx)   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   Left Forehead, Left Neck, Right Nose, and Right Hand   Stroke (HCC)    Tremor    Weakness 11/20/2020    Current Outpatient Medications:    amLODipine (NORVASC) 5 MG tablet, TAKE 1 AND 1/2 TABLETS BY MOUTH DAILY, Disp: 135 tablet, Rfl: 0   aspirin EC 81 MG tablet, Take 81 mg by mouth 3 (three) times a week., Disp: , Rfl:    atorvastatin (LIPITOR) 40 MG tablet, TAKE 1 TABLET BY MOUTH AT BEDTIME, Disp: 30 tablet, Rfl: 0   clobetasol cream (TEMOVATE) 0.05 %, APPLY TO THE AFFECTED AREA(S) TWICE DAILY UNTIL PSORIATIC LESION RESOLVED, Disp: 120 g, Rfl: 2   lisinopril (ZESTRIL) 30 MG tablet, TAKE 1 TABLET BY MOUTH EVERY DAY, Disp: 90 tablet, Rfl: 0   polyethylene glycol (MIRALAX / GLYCOLAX) 17 g packet, Take 17 g by mouth  daily., Disp: , Rfl:    primidone (MYSOLINE) 50 MG tablet, TAKE 1 AND 1/2 TABLETS BY MOUTH THREE TIMES DAILY, Disp: 405 tablet, Rfl: 0   warfarin (COUMADIN) 5 MG tablet, Take 2 tablets Tues/ Friday and 1.5 tablets daily all other days (Patient taking differently: Take 1.5 tablets daily all other days), Disp: 135 tablet, Rfl: 3  Current Facility-Administered Medications:    cefTRIAXone (ROCEPHIN) injection 1 g, 1 g, Intramuscular, Once, [name], DO Social History   Socioeconomic History   Marital status: Widowed    Spouse name: Not on file   Number of children: 3   Years of education: Not on file   Highest education level: Not on file  Occupational History    Occupation: Retired  Tobacco Use   Smoking status: Never   Smokeless tobacco: Never  Vaping Use   Vaping status: Never Used  Substance and Sexual Activity   Alcohol use: Never   Drug use: Never   Sexual activity: Not Currently  Other Topics Concern   Not on file  Social History Narrative   Not on file   Social Determinants of Health   Financial Resource Strain: Low Risk  (11/21/2020)   [facility], [facility]   Overall Financial Resource Strain (CARDIA)    Difficulty of Paying Living Expenses: Not hard at all  Food Insecurity: No Food Insecurity (11/21/2020)   Received from [facility], [facility]   Hunger Vital Sign    Worried About Running Out of Food in the Last Year: Never true    Ran Out of Food in the Last Year: Never true  Transportation Needs: No Transportation Needs (11/21/2020)   Received from [facility], [facility]   [facility] - Transportation    Lack of Transportation (Medical): No    Lack of Transportation (Non-Medical): No  Physical Activity: Not on file  Stress: Not on file  Social Connections: Unknown (05/27/2021)   Received from [facility], [facility]   Social Network    Social Network: Not on file  Intimate Partner Violence: Unknown (04/18/2021)   Received from [facility], [facility]   HITS    Physically Hurt: Not on file    Insult or Talk Down To: Not on file    Threaten Physical Harm: Not on file    Scream or Curse: Not on file   Family History  Problem Relation Age of Onset   Dementia Mother    Stroke Father    Heart disease Father    Diabetes Sister    Stroke Son     Objective: Office vital signs reviewed. BP 136/85   Ht 5\' 2"  (1.575 m)   SpO2 96%   BMI 25.42 kg/m   Physical Examination:  General: Awake, alert, chronically ill-appearing elderly female, No acute distress HEENT: Sclera white. Cardio: regular rate and rhythm, S1S2 heard, no murmurs appreciated MSK: Gait is unsteady  and requires assistance for ambulation.  She has quite a large healing bruise along the right anterior knee.  The skin breakdown seems to be healing and is hemostatic.  She has some bruising along the anterior chest near the clavicle as well.  Right forearm is in a cast  Assessment/ Plan: 89 y.o. female   Supratherapeutic INR - Plan: phytonadione (MEPHYTON) 5 MG tablet  Longstanding persistent atrial fibrillation (HCC) - Plan: CoaguChek XS/INR Waived, CBC  Anticoagulation goal of INR 2 to 3 - Plan: CoaguChek XS/INR Waived,  CBC  Anemia, unspecified type - Plan: CBC, Iron, TIBC and Ferritin Panel  Suspected UTI - Plan: Urine Culture, Urinalysis, Routine w reflex microscopic  INR is supratherapeutic very much so at 7.9 today.  I have ordered 2.5 mg of vitamin K to be administered today and then I will see her back in 36 hours.  She will hold Coumadin until instructed to resume.  I discussed red flag signs and symptoms warranting further evaluation in the emergency department and her daughter voiced good understanding  Urinalysis does not show UTI findings but does show trace blood that could not be further evaluated under microscopy due to small sample size.  I suspect that the trace blood is likely due to supratherapeutic levels of Coumadin.  Hopefully we will see resolution in this over the next couple of days   [name], DO [facility] [phone]

## 2022-10-21 NOTE — Patient Instructions (Addendum)
Give 1/2 tablet of the Vit K that was sent to the pharmacy TODAY.  DO NOT administer the other half until I instruct you to do so.   NO COUMADIN UNTIL AFTER I RECHECK HER LEVEL ON FRIDAY at 2:35pm. We are going to recheck her level on Friday.

## 2022-10-22 LAB — CBC
Hematocrit: 32.7 % — ABNORMAL LOW (ref 34.0–46.6)
Hemoglobin: 10.3 g/dL — ABNORMAL LOW (ref 11.1–15.9)
MCH: 30.9 pg (ref 26.6–33.0)
MCHC: 31.5 g/dL (ref 31.5–35.7)
MCV: 98 fL — ABNORMAL HIGH (ref 79–97)
Platelets: 579 10*3/uL — ABNORMAL HIGH (ref 150–450)
RBC: 3.33 x10E6/uL — ABNORMAL LOW (ref 3.77–5.28)
RDW: 13.3 % (ref 11.7–15.4)
WBC: 11.5 10*3/uL — ABNORMAL HIGH (ref 3.4–10.8)

## 2022-10-22 LAB — IRON,TIBC AND FERRITIN PANEL
Ferritin: 167 ng/mL — ABNORMAL HIGH (ref 15–150)
Iron Saturation: 11 % — ABNORMAL LOW (ref 15–55)
Iron: 22 ug/dL — ABNORMAL LOW (ref 27–139)
Total Iron Binding Capacity: 200 ug/dL — ABNORMAL LOW (ref 250–450)
UIBC: 178 ug/dL (ref 118–369)

## 2022-10-23 ENCOUNTER — Telehealth: Payer: Self-pay | Admitting: Family Medicine

## 2022-10-23 ENCOUNTER — Encounter: Payer: Self-pay | Admitting: Family Medicine

## 2022-10-23 ENCOUNTER — Ambulatory Visit: Payer: Medicare Other | Admitting: Family Medicine

## 2022-10-23 ENCOUNTER — Telehealth: Payer: Self-pay

## 2022-10-23 VITALS — BP 132/58 | HR 82 | Temp 98.5°F | Ht 62.0 in

## 2022-10-23 DIAGNOSIS — I4811 Longstanding persistent atrial fibrillation: Secondary | ICD-10-CM

## 2022-10-23 DIAGNOSIS — Z7901 Long term (current) use of anticoagulants: Secondary | ICD-10-CM

## 2022-10-23 DIAGNOSIS — Z5181 Encounter for therapeutic drug level monitoring: Secondary | ICD-10-CM | POA: Diagnosis not present

## 2022-10-23 LAB — COAGUCHEK XS/INR WAIVED
INR: 7.9 (ref 0.9–1.1)
INR: 1.5 — ABNORMAL HIGH (ref 0.9–1.1)
Prothrombin Time: 94.3 s
Prothrombin Time: 17.9 s

## 2022-10-23 NOTE — Telephone Encounter (Signed)
Transition Care Management Follow-up Telephone Call Date of discharge and from where: [facility] 9[facility] How have you been since you were released from the hospital? Pt is not doing as good as she did before her fall. Pt is following up with PCP 10/11 Any questions or concerns? No  Items Reviewed: Did the pt receive and understand the discharge instructions provided? Yes  Medications obtained and verified? Yes  Other? No  Any new allergies since your discharge? No  Dietary orders reviewed? No Do you have support at home? Yes    Follow up appointments reviewed:  PCP Hospital f/u appt confirmed? Yes  Scheduled to see PCP on 10/11 @  Specialist Hospital f/u appt confirmed? No  Scheduled to see  on  @ . Are transportation arrangements needed? No  If their condition worsens, is the pt aware to call PCP or go to the Emergency Dept.? Yes Was the patient provided with contact information for the PCP's office or ED? Yes Was to pt encouraged to call back with questions or concerns? Yes

## 2022-10-23 NOTE — Progress Notes (Signed)
Subjective: CC: Follow-up supratherapeutic INR PCP: Dominique Ip, DO RUE:AVWU Dominique Farley is a 89 y.o. female presenting to clinic today for:  1.  Atrial fibrillation Goal INR 2-3 Patient found to have a profoundly supratherapeutic INR at 7.9.  She was given 2.5 mg of vitamin K and she is here for 36-hour follow-up.  She is accompanied today by her daughter and caregiver.  They report that the blood that was noted in urine has totally resolved.  Her typical regimen prior to that visit was 10 mg 2 days/week and 7.5 mg daily all others.   ROS: Per HPI  Allergies  Allergen Reactions   Codeine Other (See Comments)   Influenza Vaccines    Remeron [Mirtazapine] Other (See Comments)    Dizziness    Sulfa Antibiotics Hives   Past Medical History:  Diagnosis Date   Acute cystitis with hematuria 11/22/2020   Atrial fibrillation (HCC)    BCC (basal cell carcinoma of skin) 11/14/2004   Right Mid Cheek(Cx3,5FU)   BCC (basal cell carcinoma of skin) 04/16/2011   Right Crease(tx p bx)   BCC (basal cell carcinoma of skin) 06/16/2016   Left Nare(tx p bx)   BCC (basal cell carcinoma of skin) Atypical Basaloid 09/29/2018   Left Side Nose(tx p bx)   Hypertension    Osteoporosis    Pneumonia of lower lobe due to infectious organism 11/20/2020   SCC (squamous cell carcinoma) 09/29/2018   Left Jawline(tx p bx)   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   Right Jawline(Cx3,5FU) and Left Upper Cheek(Cx3,5FU)   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   Left Jawline(tx p bx), Nose(tx p bx), Right Cheek(tx p bx), and Right Forearm(tx p bx)   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   Right Temple(Cx3,5FU) and Left Outer Eye(Cx3,5FU)   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   Right Forearm(Cx3,5FU)   Squamous cell carcinoma in situ (SCCIS) 03/13/2005   Left Side of Nose(Cx3,5FU)   Squamous cell carcinoma in situ (SCCIS) 06/04/2005   Inner Right Cheek/Eye(Tx p  Bx)   Squamous cell carcinoma in situ (SCCIS) 02/12/2010   Right Cheek(tx p bx)   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 2 09/29/2018   Upper Lip(tx p bx) and Bridge of Nose(tx p bx)   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   Left Forehead, Left Neck, Right Nose, and Right Hand   Stroke (HCC)    Tremor    Weakness 11/20/2020    Current Outpatient Medications:    amLODipine (NORVASC) 5 MG tablet, TAKE 1 AND 1/2 TABLETS BY MOUTH DAILY, Disp: 135 tablet, Rfl: 0   aspirin EC 81 MG tablet, Take 81 mg by mouth 3 (three) times a week., Disp: , Rfl:    atorvastatin (LIPITOR) 40 MG tablet, TAKE 1 TABLET BY MOUTH AT BEDTIME, Disp: 30 tablet, Rfl: 0   clobetasol cream (TEMOVATE) 0.05 %, APPLY TO THE AFFECTED AREA(S) TWICE DAILY UNTIL PSORIATIC LESION RESOLVED, Disp: 120 g, Rfl: 2   lisinopril (ZESTRIL) 30 MG tablet, TAKE 1 TABLET BY MOUTH EVERY DAY, Disp: 90 tablet, Rfl: 0   polyethylene glycol (MIRALAX / GLYCOLAX) 17 g packet, Take 17 g by mouth daily., Disp: , Rfl:    primidone (MYSOLINE) 50 MG tablet, TAKE 1 AND 1/2 TABLETS BY MOUTH THREE TIMES DAILY, Disp: 405 tablet, Rfl: 0   warfarin (COUMADIN) 5 MG tablet, Take 2 tablets Tues/ Friday and 1.5 tablets daily  all other days (Patient taking differently: Take 1.5 tablets daily all other days), Disp: 135 tablet, Rfl: 3 Social History   Socioeconomic History   Marital status: Widowed    Spouse name: Not on file   Number of children: 3   Years of education: Not on file   Highest education level: Not on file  Occupational History   Occupation: Retired  Tobacco Use   Smoking status: Never   Smokeless tobacco: Never  Vaping Use   Vaping status: Never Used  Substance and Sexual Activity   Alcohol use: Never   Drug use: Never   Sexual activity: Not Currently  Other Topics Concern   Not on file  Social History Narrative   Not on file   Social Determinants of Health   Financial Resource Strain: Low Risk  (11/21/2020)    Received from [facility], [facility]   Overall Financial Resource Strain (CARDIA)    Difficulty of Paying Living Expenses: Not hard at all  Food Insecurity: No Food Insecurity (11/21/2020)   Received from [facility], [facility]   Hunger Vital Sign    Worried About Running Out of Food in the Last Year: Never true    Ran Out of Food in the Last Year: Never true  Transportation Needs: No Transportation Needs (11/21/2020)   Received from [facility], [facility]   [facility] - Transportation    Lack of Transportation (Medical): No    Lack of Transportation (Non-Medical): No  Physical Activity: Not on file  Stress: Not on file  Social Connections: Unknown (05/27/2021)   Received from [facility], [facility]   Social Network    Social Network: Not on file  Intimate Partner Violence: Unknown (04/18/2021)   Received from [facility], [facility]   HITS    Physically Hurt: Not on file    Insult or Talk Down To: Not on file    Threaten Physical Harm: Not on file    Scream or Curse: Not on file   Family History  Problem Relation Age of Onset   Dementia Mother    Stroke Father    Heart disease Father    Diabetes Sister    Stroke Son     Objective: Office vital signs reviewed. BP (!) 132/58   Pulse 82   Temp 98.5 F (36.9 C)   Ht 5\' 2"  (1.575 m)   SpO2 97%   BMI 25.42 kg/m   Physical Examination:  General: Awake, alert, nontoxic-appearing elderly female, No acute distress MSK: Arrives in wheelchair  Assessment/ Plan: 89 y.o. female   Longstanding persistent atrial fibrillation (HCC) - Plan: CoaguChek XS/INR Waived  Anticoagulation goal of INR 2 to 3 - Plan: CoaguChek XS/INR Waived  INR now therapeutic at 2.9.  Okay to resume 5 mg of coumadin only.  This is a reduction down from 7.5 mg 5 days a week and 10 mg 2 days a week.  I am going to see her back again on Tuesday for INR recheck.  Further instructions pending that result   Dominique Ip, DO [facility] [phone]

## 2022-10-25 LAB — URINE CULTURE

## 2022-10-26 ENCOUNTER — Other Ambulatory Visit: Payer: Self-pay | Admitting: Family Medicine

## 2022-10-26 DIAGNOSIS — B9689 Other specified bacterial agents as the cause of diseases classified elsewhere: Secondary | ICD-10-CM

## 2022-10-26 MED ORDER — CEPHALEXIN 500 MG PO CAPS
500.0000 mg | ORAL_CAPSULE | Freq: Two times a day (BID) | ORAL | 0 refills | Status: AC
Start: 2022-10-26 — End: 2022-11-02

## 2022-10-26 NOTE — Progress Notes (Signed)
Meds ordered this encounter  Medications   cephALEXin (KEFLEX) 500 MG capsule    Sig: Take 1 capsule (500 mg total) by mouth 2 (two) times daily for 7 days.    Dispense:  14 capsule    Refill:  0

## 2022-10-27 ENCOUNTER — Ambulatory Visit: Payer: Medicare Other | Admitting: Family Medicine

## 2022-10-27 ENCOUNTER — Encounter: Payer: Self-pay | Admitting: Family Medicine

## 2022-10-27 VITALS — BP 139/79 | HR 88 | Temp 98.3°F | Ht 62.0 in

## 2022-10-27 DIAGNOSIS — I4811 Longstanding persistent atrial fibrillation: Secondary | ICD-10-CM

## 2022-10-27 DIAGNOSIS — Z5181 Encounter for therapeutic drug level monitoring: Secondary | ICD-10-CM

## 2022-10-27 DIAGNOSIS — Z7901 Long term (current) use of anticoagulants: Secondary | ICD-10-CM | POA: Diagnosis not present

## 2022-10-27 LAB — COAGUCHEK XS/INR WAIVED
INR: 1.8 — ABNORMAL HIGH (ref 0.9–1.1)
Prothrombin Time: 21.9 s

## 2022-10-27 NOTE — Progress Notes (Signed)
Subjective: CC:INR check PCP: [name], DO KGM:WNUU Sherpa is a 89 y.o. female presenting to clinic today for:  1. INR recheck Noted to be subtherapeutic at last visit as she had been on vitamin K and with holding her Coumadin due to horribly elevated INR level.  She is back on 5 mg daily of Coumadin and tolerating without difficulty.  No bleeding.  They just started the Keflex for UTI yesterday.  Has had a little bit of changes in mentation but not sure if this is due to UTI.   ROS: Per HPI  Allergies  Allergen Reactions   Codeine Other (See Comments)   Influenza Vaccines    Remeron [Mirtazapine] Other (See Comments)    Dizziness    Sulfa Antibiotics Hives   Past Medical History:  Diagnosis Date   Acute cystitis with hematuria 11/22/2020   Atrial fibrillation (HCC)    BCC (basal cell carcinoma of skin) 11/14/2004   Right Mid Cheek(Cx3,5FU)   BCC (basal cell carcinoma of skin) 04/16/2011   Right Crease(tx p bx)   BCC (basal cell carcinoma of skin) 06/16/2016   Left Nare(tx p bx)   BCC (basal cell carcinoma of skin) Atypical Basaloid 09/29/2018   Left Side Nose(tx p bx)   Hypertension    Osteoporosis    Pneumonia of lower lobe due to infectious organism 11/20/2020   SCC (squamous cell carcinoma) 09/29/2018   Left Jawline(tx p bx)   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   Right Jawline(Cx3,5FU) and Left Upper Cheek(Cx3,5FU)   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   Left Jawline(tx p bx), Nose(tx p bx), Right Cheek(tx p bx), and Right Forearm(tx p bx)   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   Right Temple(Cx3,5FU) and Left Outer Eye(Cx3,5FU)   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   Right Forearm(Cx3,5FU)   Squamous cell carcinoma in situ (SCCIS) 03/13/2005   Left Side of Nose(Cx3,5FU)   Squamous cell carcinoma in situ (SCCIS) 06/04/2005   Inner Right Cheek/Eye(Tx p Bx)   Squamous cell carcinoma in situ (SCCIS)  02/12/2010   Right Cheek(tx p bx)   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 2 09/29/2018   Upper Lip(tx p bx) and Bridge of Nose(tx p bx)   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   Left Forehead, Left Neck, Right Nose, and Right Hand   Stroke (HCC)    Tremor    Weakness 11/20/2020    Current Outpatient Medications:    amLODipine (NORVASC) 5 MG tablet, TAKE 1 AND 1/2 TABLETS BY MOUTH DAILY, Disp: 135 tablet, Rfl: 0   aspirin EC 81 MG tablet, Take 81 mg by mouth 3 (three) times a week., Disp: , Rfl:    atorvastatin (LIPITOR) 40 MG tablet, TAKE 1 TABLET BY MOUTH AT BEDTIME, Disp: 30 tablet, Rfl: 0   cephALEXin (KEFLEX) 500 MG capsule, Take 1 capsule (500 mg total) by mouth 2 (two) times daily for 7 days., Disp: 14 capsule, Rfl: 0   clobetasol cream (TEMOVATE) 0.05 %, APPLY TO THE AFFECTED AREA(S) TWICE DAILY UNTIL PSORIATIC LESION RESOLVED, Disp: 120 g, Rfl: 2   lisinopril (ZESTRIL) 30 MG tablet, TAKE 1 TABLET BY MOUTH EVERY DAY, Disp: 90 tablet, Rfl: 0   polyethylene glycol (MIRALAX / GLYCOLAX) 17 g packet, Take 17 g by mouth daily., Disp: , Rfl:    primidone (MYSOLINE) 50 MG tablet, TAKE 1 AND 1/2 TABLETS BY MOUTH THREE TIMES  DAILY, Disp: 405 tablet, Rfl: 0   warfarin (COUMADIN) 5 MG tablet, Take 2 tablets Tues/ Friday and 1.5 tablets daily all other days (Patient taking differently: Take 1.5 tablets daily all other days), Disp: 135 tablet, Rfl: 3 Social History   Socioeconomic History   Marital status: Widowed    Spouse name: Not on file   Number of children: 3   Years of education: Not on file   Highest education level: Not on file  Occupational History   Occupation: Retired  Tobacco Use   Smoking status: Never   Smokeless tobacco: Never  Vaping Use   Vaping status: Never Used  Substance and Sexual Activity   Alcohol use: Never   Drug use: Never   Sexual activity: Not Currently  Other Topics Concern   Not on file  Social History Narrative   Not on file    Social Determinants of Health   Financial Resource Strain: Low Risk  (11/21/2020)   Received from [facility], [facility]   Overall Financial Resource Strain (CARDIA)    Difficulty of Paying Living Expenses: Not hard at all  Food Insecurity: No Food Insecurity (11/21/2020)   Received from [facility], [facility]   Hunger Vital Sign    Worried About Running Out of Food in the Last Year: Never true    Ran Out of Food in the Last Year: Never true  Transportation Needs: No Transportation Needs (11/21/2020)   Received from [facility], [facility]   [facility] - Transportation    Lack of Transportation (Medical): No    Lack of Transportation (Non-Medical): No  Physical Activity: Not on file  Stress: Not on file  Social Connections: Unknown (05/27/2021)   Received from [facility], [facility]   Social Network    Social Network: Not on file  Intimate Partner Violence: Unknown (04/18/2021)   Received from [facility], [facility]   HITS    Physically Hurt: Not on file    Insult or Talk Down To: Not on file    Threaten Physical Harm: Not on file    Scream or Curse: Not on file   Family History  Problem Relation Age of Onset   Dementia Mother    Stroke Father    Heart disease Father    Diabetes Sister    Stroke Son     Objective: Office vital signs reviewed. BP 139/79   Pulse 88   Temp 98.3 F (36.8 C)   Ht 5\' 2"  (1.575 m)   SpO2 96%   BMI 25.42 kg/m   Physical Examination:  General: Awake, alert, well nourished, No acute distress MSK: arrives in wheelchair, Right forearm casted. Senile purpura present.  Assessment/ Plan: 89 y.o. female   Longstanding persistent atrial fibrillation (HCC)  Anticoagulation goal of INR 2 to 3 - Plan: CoaguChek XS/INR Waived  INR 1.8 but slightly better then okay to increase to 7.5 mg today then 5 mg daily except for Tuesdays.  Recheck 10/25   [name], DO [facility] [phone]

## 2022-10-28 ENCOUNTER — Ambulatory Visit (INDEPENDENT_AMBULATORY_CARE_PROVIDER_SITE_OTHER): Payer: Medicare Other | Admitting: Orthopedic Surgery

## 2022-10-28 ENCOUNTER — Encounter: Payer: Self-pay | Admitting: Orthopedic Surgery

## 2022-10-28 ENCOUNTER — Other Ambulatory Visit (INDEPENDENT_AMBULATORY_CARE_PROVIDER_SITE_OTHER): Payer: Medicare Other

## 2022-10-28 DIAGNOSIS — S62614D Displaced fracture of proximal phalanx of right ring finger, subsequent encounter for fracture with routine healing: Secondary | ICD-10-CM | POA: Diagnosis not present

## 2022-10-28 DIAGNOSIS — S62612D Displaced fracture of proximal phalanx of right middle finger, subsequent encounter for fracture with routine healing: Secondary | ICD-10-CM | POA: Diagnosis not present

## 2022-10-28 DIAGNOSIS — S62616D Displaced fracture of proximal phalanx of right little finger, subsequent encounter for fracture with routine healing: Secondary | ICD-10-CM

## 2022-10-28 NOTE — Patient Instructions (Signed)
Buddy tape fingers for another week.  In one week, you can stop taping and start working on range of motion  Follow up in 2 weeks

## 2022-10-28 NOTE — Progress Notes (Signed)
Return patient Visit  Assessment: Dominique Farley is a 89 y.o. female with the following: 1. Closed displaced fracture of proximal phalanx of right little finger, subsequent encounter 2. Closed displaced fracture of proximal phalanx of right ring finger, subsequent encounter 3. Closed displaced fracture of proximal phalanx of right middle finger, subsequent encounter 4. Right lower leg skin tear   Plan: Magda Kiel sustained minimally displaced fractures of the right long, ring and small fingers.  She developed a lot of swelling in the right arm with the most recent cast.  It was bivalved, and this improved some of her symptoms.  Injury was sustained approximately 5 weeks ago.  We will transition her out of the cast.  I have recommended buddy taping for the next week, to provide some additional stabilization.  In 1 week time, she can start to work on gentle range of motion of the fingers on her right hand.  She states her understanding.  I will see her back in 2 weeks.  Continue with dressing changes on the right lower leg.   Follow-up: No follow-ups on file.  Subjective:  Chief Complaint  Patient presents with   Routine Post Op    Right hand post of and patients daughter said her arm is still swollen    History of Present Illness: Dominique Farley is a 89 y.o. female who turns for evaluation of right hand pain.  She sustained fractures to the fingers on her right hand approximately 5 weeks ago.  She has done well in a cast.  She was evaluated after her most recent cast was placed by Dr. Romeo Apple.  She had some swelling.  He evaluated the cast, and ultimately bivalved cast.  Since then, some the swelling has improved, but she continues to have a lot of swelling.  They have been completing dressing changes on the skin tear on her right lower leg.   Review of Systems: No fevers or chills No numbness or tingling No chest pain No shortness of breath No bowel or bladder dysfunction No GI  distress No headaches   Objective: There were no vitals taken for this visit.  Physical Exam:  General: Elderly female., Alert and oriented., No acute distress., and Seated in a wheelchair.   Right hand and arm with persistent swelling.  No concern for compartment syndrome.  Sensation intact throughout the right hand.  Mild deformity at the base of the right ring finger.  She does tolerate gentle range of motion.  Fingers are warm and well-perfused.  Skin tear on her right lower leg is healing.  Very small area remains without scabbing.  No drainage.  No redness.    IMAGING: I personally ordered and reviewed the following images  X-rays of the right hand were obtained in clinic today.  There are fractures at the proximal phalanx of the ring, long and small fingers.  These remain in unchanged alignment.  Mild displacement overall.  No bony lesions.  No further injuries.  No dislocations.  Impression: Stable right long, ring and small finger proximal phalanx fractures   New Medications:  No orders of the defined types were placed in this encounter.     Oliver Barre, MD  10/28/2022 1:33 PM

## 2022-11-06 ENCOUNTER — Ambulatory Visit (INDEPENDENT_AMBULATORY_CARE_PROVIDER_SITE_OTHER): Payer: Medicare Other | Admitting: Family Medicine

## 2022-11-06 ENCOUNTER — Encounter: Payer: Self-pay | Admitting: Family Medicine

## 2022-11-06 VITALS — BP 132/68 | HR 68 | Temp 98.6°F | Ht 62.0 in | Wt 125.0 lb

## 2022-11-06 DIAGNOSIS — I4811 Longstanding persistent atrial fibrillation: Secondary | ICD-10-CM

## 2022-11-06 DIAGNOSIS — Z5181 Encounter for therapeutic drug level monitoring: Secondary | ICD-10-CM

## 2022-11-06 DIAGNOSIS — R351 Nocturia: Secondary | ICD-10-CM

## 2022-11-06 DIAGNOSIS — Z7901 Long term (current) use of anticoagulants: Secondary | ICD-10-CM | POA: Diagnosis not present

## 2022-11-06 LAB — COAGUCHEK XS/INR WAIVED
INR: 3.2 — ABNORMAL HIGH (ref 0.9–1.1)
Prothrombin Time: 38.5 s

## 2022-11-06 NOTE — Patient Instructions (Addendum)
Just 1 tablet daily. REDUCE amount of water 2 hours before bedtime to see if that helps urination at night.   If no improvement over weekend, bring in urine specimen cup (make sure collected NO more than 1 hour prior to dropping off)

## 2022-11-06 NOTE — Progress Notes (Signed)
Subjective: CC:INR check PCP: [name], DO ZOX:WRUE Dominique Farley is a 89 y.o. female presenting to clinic today for:  1.  Subtherapeutic INR Patient noted to have subtherapeutic INR at 1.8 last visit.  She was advanced to 7.5 mg on Tuesdays and 5 mg daily all other days with every Coumadin.  No bleeding.  Since her last visit her cast was removed.  She is accompanied today by her daughter who notes that she has been having nocturia for the last several days.  She does drink several bottles of water in the afternoon and with supper and then does drink fluids prior to bedtime as well.  She is getting up at least 4-5 times per night but not sure that she is actually urinating every time she gets up this was just noted by her caregiver.  Patient denies any dysuria, hematuria.  No fevers.  No changes in cognition   ROS: Per HPI  Allergies  Allergen Reactions   Codeine Other (See Comments)   Influenza Vaccines    Remeron [Mirtazapine] Other (See Comments)    Dizziness    Sulfa Antibiotics Hives   Past Medical History:  Diagnosis Date   Acute cystitis with hematuria 11/22/2020   Atrial fibrillation (HCC)    BCC (basal cell carcinoma of skin) 11/14/2004   Right Mid Cheek(Cx3,5FU)   BCC (basal cell carcinoma of skin) 04/16/2011   Right Crease(tx p bx)   BCC (basal cell carcinoma of skin) 06/16/2016   Left Nare(tx p bx)   BCC (basal cell carcinoma of skin) Atypical Basaloid 09/29/2018   Left Side Nose(tx p bx)   Hypertension    Osteoporosis    Pneumonia of lower lobe due to infectious organism 11/20/2020   SCC (squamous cell carcinoma) 09/29/2018   Left Jawline(tx p bx)   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   Right Jawline(Cx3,5FU) and Left Upper Cheek(Cx3,5FU)   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   Left Jawline(tx p bx), Nose(tx p bx), Right Cheek(tx p bx), and Right Forearm(tx p bx)   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   Right  Temple(Cx3,5FU) and Left Outer Eye(Cx3,5FU)   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   Right Forearm(Cx3,5FU)   Squamous cell carcinoma in situ (SCCIS) 03/13/2005   Left Side of Nose(Cx3,5FU)   Squamous cell carcinoma in situ (SCCIS) 06/04/2005   Inner Right Cheek/Eye(Tx p Bx)   Squamous cell carcinoma in situ (SCCIS) 02/12/2010   Right Cheek(tx p bx)   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 2 09/29/2018   Upper Lip(tx p bx) and Bridge of Nose(tx p bx)   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   Left Forehead, Left Neck, Right Nose, and Right Hand   Stroke (HCC)    Tremor    Weakness 11/20/2020    Current Outpatient Medications:    amLODipine (NORVASC) 5 MG tablet, TAKE 1 AND 1/2 TABLETS BY MOUTH DAILY, Disp: 135 tablet, Rfl: 0   aspirin EC 81 MG tablet, Take 81 mg by mouth 3 (three) times a week., Disp: , Rfl:    atorvastatin (LIPITOR) 40 MG tablet, TAKE 1 TABLET BY MOUTH AT BEDTIME, Disp: 30 tablet, Rfl: 0   clobetasol cream (TEMOVATE) 0.05 %, APPLY TO THE AFFECTED AREA(S) TWICE DAILY UNTIL PSORIATIC LESION RESOLVED, Disp: 120 g, Rfl: 2   lisinopril (ZESTRIL) 30 MG tablet, TAKE 1 TABLET BY MOUTH EVERY DAY, Disp: 90 tablet, Rfl: 0  polyethylene glycol (MIRALAX / GLYCOLAX) 17 g packet, Take 17 g by mouth daily., Disp: , Rfl:    primidone (MYSOLINE) 50 MG tablet, TAKE 1 AND 1/2 TABLETS BY MOUTH THREE TIMES DAILY, Disp: 405 tablet, Rfl: 0   warfarin (COUMADIN) 5 MG tablet, Take 2 tablets Tues/ Friday and 1.5 tablets daily all other days (Patient taking differently: Take 1.5 tablets daily all other days), Disp: 135 tablet, Rfl: 3 Social History   Socioeconomic History   Marital status: Widowed    Spouse name: Not on file   Number of children: 3   Years of education: Not on file   Highest education level: Not on file  Occupational History   Occupation: Retired  Tobacco Use   Smoking status: Never   Smokeless tobacco: Never  Vaping Use   Vaping  status: Never Used  Substance and Sexual Activity   Alcohol use: Never   Drug use: Never   Sexual activity: Not Currently  Other Topics Concern   Not on file  Social History Narrative   Not on file   Social Determinants of Health   Financial Resource Strain: Low Risk  (11/21/2020)   [facility], [facility]   Overall Financial Resource Strain (CARDIA)    Difficulty of Paying Living Expenses: Not hard at all  Food Insecurity: No Food Insecurity (11/21/2020)   Received from [facility], [facility]   Hunger Vital Sign    Worried About Running Out of Food in the Last Year: Never true    Ran Out of Food in the Last Year: Never true  Transportation Needs: No Transportation Needs (11/21/2020)   Received from [facility], [facility]   [facility] - Transportation    Lack of Transportation (Medical): No    Lack of Transportation (Non-Medical): No  Physical Activity: Not on file  Stress: Not on file  Social Connections: Unknown (05/27/2021)   Received from [facility], [facility]   Social Network    Social Network: Not on file  Intimate Partner Violence: Unknown (04/18/2021)   Received from [facility], [facility]   HITS    Physically Hurt: Not on file    Insult or Talk Down To: Not on file    Threaten Physical Harm: Not on file    Scream or Curse: Not on file   Family History  Problem Relation Age of Onset   Dementia Mother    Stroke Father    Heart disease Father    Diabetes Sister    Stroke Son     Objective: Office vital signs reviewed. Pulse 68   Temp 98.6 F (37 C)   Ht 5\' 2"  (1.575 m)   Wt 125 lb (56.7 kg)   SpO2 92%   BMI 22.86 kg/m   Physical Examination:  General: Awake, alert, nontoxic elderly female.  No acute distress Cardio: regular rate and rhythm Pulm: normal work of breathing on room air MSK: Arrives in wheelchair.  Her cast is off her right upper extremity and the swelling looks so much better on that  side.  Senile purpura present   Assessment/ Plan: 89 y.o. female   Longstanding persistent atrial fibrillation (HCC)  Anticoagulation goal of INR 2 to 3 - Plan: CoaguChek XS/INR Waived  Nocturia - Plan: Urinalysis, Routine w reflex microscopic, Urine Culture  Seems to be rate and rhythm controlled today.  INR is supratherapeutic however at 3.2 so I will have her go down to  5 mg daily and we will get her reassess in the next 2 weeks.  Instructions were discussed with her daughter, who voiced good understanding) that was provided with calendar.  For nocturia I do wonder if this is possibly induced from excessive fluids prior to bed.  I have asked that they reduce by at least 50% for at least 2 hours prior to bed.  If her urination frequency reduces then this is likely induced by consumption.  If not I gave her a collection kit to obtain urinalysis and Future orders have been placed to rule out UTI.  Though patient reports no concerning symptoms other than urinary frequency at nighttime only to suggest infection.   [name], DO [facility] [phone]

## 2022-11-09 ENCOUNTER — Other Ambulatory Visit: Payer: Medicare Other

## 2022-11-09 DIAGNOSIS — R351 Nocturia: Secondary | ICD-10-CM

## 2022-11-09 LAB — URINALYSIS, ROUTINE W REFLEX MICROSCOPIC
Bilirubin, UA: NEGATIVE
Glucose, UA: NEGATIVE
Ketones, UA: NEGATIVE
Leukocytes,UA: NEGATIVE
Nitrite, UA: NEGATIVE
Protein,UA: NEGATIVE
RBC, UA: NEGATIVE
Specific Gravity, UA: 1.015 (ref 1.005–1.030)
Urobilinogen, Ur: 1 mg/dL (ref 0.2–1.0)
pH, UA: 7 (ref 5.0–7.5)

## 2022-11-11 ENCOUNTER — Encounter: Payer: Self-pay | Admitting: Orthopedic Surgery

## 2022-11-11 ENCOUNTER — Other Ambulatory Visit (INDEPENDENT_AMBULATORY_CARE_PROVIDER_SITE_OTHER): Payer: Medicare Other

## 2022-11-11 ENCOUNTER — Ambulatory Visit (INDEPENDENT_AMBULATORY_CARE_PROVIDER_SITE_OTHER): Payer: Medicare Other | Admitting: Orthopedic Surgery

## 2022-11-11 VITALS — Wt 124.0 lb

## 2022-11-11 DIAGNOSIS — S62612D Displaced fracture of proximal phalanx of right middle finger, subsequent encounter for fracture with routine healing: Secondary | ICD-10-CM

## 2022-11-11 DIAGNOSIS — S62616D Displaced fracture of proximal phalanx of right little finger, subsequent encounter for fracture with routine healing: Secondary | ICD-10-CM

## 2022-11-11 DIAGNOSIS — S62614D Displaced fracture of proximal phalanx of right ring finger, subsequent encounter for fracture with routine healing: Secondary | ICD-10-CM | POA: Diagnosis not present

## 2022-11-11 DIAGNOSIS — S81811A Laceration without foreign body, right lower leg, initial encounter: Secondary | ICD-10-CM

## 2022-11-11 LAB — URINE CULTURE

## 2022-11-11 NOTE — Progress Notes (Signed)
Return patient Visit  Assessment: Dominique Farley is a 89 y.o. female with the following: 1. Closed displaced fracture of proximal phalanx of right little finger, subsequent encounter 2. Closed displaced fracture of proximal phalanx of right ring finger, subsequent encounter 3. Closed displaced fracture of proximal phalanx of right middle finger, subsequent encounter 4. Right lower leg skin tear   Plan: Magda Kiel sustained minimally displaced fractures of the right long, ring and small fingers.  She was immobilized, and more recently she was buddy taping her fingers.  She is no longer taping her fingers.  She is not complaining of pain.  Radiographs remained stable.  She has developed some stiffness.  Exercises were demonstrated in clinic today.  She will continue to work on her range of motion, and increase her activities as tolerated.  Regarding the skin tear, it is healing appropriately.  It is almost completely dry.  Follow-up: Return in about 4 weeks (around 12/09/2022).  Subjective:  No chief complaint on file.   History of Present Illness: Dominique Farley is a 89 y.o. female who turns for evaluation of right hand pain.  She sustained fractures to the fingers on her right hand approximately 5 weeks ago.  She has done well in a cast.  She was evaluated after her most recent cast was placed by Dr. Romeo Apple.  She had some swelling.  He evaluated the cast, and ultimately bivalved cast.  Since then, some the swelling has improved, but she continues to have a lot of swelling.  They have been completing dressing changes on the skin tear on her right lower leg.   Review of Systems: No fevers or chills No numbness or tingling No chest pain No shortness of breath No bowel or bladder dysfunction No GI distress No headaches   Objective: Wt 124 lb (56.2 kg)   BMI 22.68 kg/m   Physical Exam:  General: Elderly female., Alert and oriented., No acute distress., and Seated in a  wheelchair.   Right hand and arm with persistent swelling.  No concern for compartment syndrome.  Sensation intact throughout the right hand.  Mild deformity at the base of the right ring finger.  She does tolerate gentle range of motion.  Fingers are warm and well-perfused.  Skin tear on her right lower leg is healing.  Very small area remains without scabbing.  No drainage.  No redness.    IMAGING: I personally ordered and reviewed the following images  X-rays of the right hand were obtained in clinic today.  There are fractures at the proximal phalanx of the ring, long and small fingers.  These remain in unchanged alignment.  Mild displacement overall.  No bony lesions.  No further injuries.  No dislocations.  Impression: Stable right long, ring and small finger proximal phalanx fractures   New Medications:  No orders of the defined types were placed in this encounter.     Oliver Barre, MD  11/11/2022 2:08 PM

## 2022-11-11 NOTE — Patient Instructions (Signed)
Continue to work on range of motion.  No restrictions.  Gradually increase her level of activity.  Exercises demonstrated in clinic today.  Contact clinic if you have any issues.

## 2022-11-17 ENCOUNTER — Other Ambulatory Visit: Payer: Self-pay | Admitting: Family Medicine

## 2022-11-17 DIAGNOSIS — I10 Essential (primary) hypertension: Secondary | ICD-10-CM

## 2022-11-17 DIAGNOSIS — I63512 Cerebral infarction due to unspecified occlusion or stenosis of left middle cerebral artery: Secondary | ICD-10-CM

## 2022-11-18 ENCOUNTER — Encounter: Payer: Self-pay | Admitting: Family Medicine

## 2022-11-18 ENCOUNTER — Ambulatory Visit: Payer: Medicare Other | Admitting: Family Medicine

## 2022-11-18 VITALS — BP 132/64 | HR 88 | Temp 98.3°F | Ht 62.0 in | Wt 132.0 lb

## 2022-11-18 DIAGNOSIS — Z7901 Long term (current) use of anticoagulants: Secondary | ICD-10-CM | POA: Diagnosis not present

## 2022-11-18 DIAGNOSIS — Z5181 Encounter for therapeutic drug level monitoring: Secondary | ICD-10-CM

## 2022-11-18 DIAGNOSIS — I4811 Longstanding persistent atrial fibrillation: Secondary | ICD-10-CM

## 2022-11-18 DIAGNOSIS — I63512 Cerebral infarction due to unspecified occlusion or stenosis of left middle cerebral artery: Secondary | ICD-10-CM

## 2022-11-18 LAB — POCT INR: INR: 2.2 (ref 2.0–3.0)

## 2022-11-18 LAB — COAGUCHEK XS/INR WAIVED
INR: 2.2 — ABNORMAL HIGH (ref 0.9–1.1)
Prothrombin Time: 26.3 s

## 2022-11-18 NOTE — Progress Notes (Signed)
Subjective:  Patient ID: Dominique Farley, female    DOB: 1926-10-02, 89 y.o.   MRN: [id number]  Patient Care Team: [name], DO as PCP - General (Family Medicine) Wyline Mood Dorothe Pea, MD as PCP - Cardiology (Cardiology) Glyn Ade, PA-C as Physician Assistant (Dermatology) Janalyn Harder, MD (Inactive) as Consulting Physician (Dermatology)   Chief Complaint:  No chief complaint on file.   HPI: Dominique Farley is a 89 y.o. female presenting on 11/18/2022 for No chief complaint on file.  HPI 1. Anticoagulation goal of INR 2 to 3 2. Longstanding persistent atrial fibrillation (HCC) 3. Cerebrovascular accident (CVA) due to occlusion of left middle cerebral artery (HCC) Reports compliance with 5 mg daily. Denies any upcoming procedures, hospitalizations. States that they are not allowing her to have any greens due to the fluctuations. Denies signs of bleeding.    Relevant past medical, surgical, family, and social history reviewed and updated as indicated.  Allergies and medications reviewed and updated. Data reviewed: Chart in Epic.   Past Medical History:  Diagnosis Date   Acute cystitis with hematuria 11/22/2020   Atrial fibrillation (HCC)    BCC (basal cell carcinoma of skin) 11/14/2004   Right Mid Cheek(Cx3,5FU)   BCC (basal cell carcinoma of skin) 04/16/2011   Right Crease(tx p bx)   BCC (basal cell carcinoma of skin) 06/16/2016   Left Nare(tx p bx)   BCC (basal cell carcinoma of skin) Atypical Basaloid 09/29/2018   Left Side Nose(tx p bx)   Hypertension    Osteoporosis    Pneumonia of lower lobe due to infectious organism 11/20/2020   SCC (squamous cell carcinoma) 09/29/2018   Left Jawline(tx p bx)   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   Right Jawline(Cx3,5FU) and Left Upper Cheek(Cx3,5FU)   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   Left Jawline(tx p bx), Nose(tx p bx), Right Cheek(tx p bx), and Right Forearm(tx p bx)   SCC (squamous cell  carcinoma)-Well Diff x 2 05/16/2004   Right Temple(Cx3,5FU) and Left Outer Eye(Cx3,5FU)   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   Right Forearm(Cx3,5FU)   Squamous cell carcinoma in situ (SCCIS) 03/13/2005   Left Side of Nose(Cx3,5FU)   Squamous cell carcinoma in situ (SCCIS) 06/04/2005   Inner Right Cheek/Eye(Tx p Bx)   Squamous cell carcinoma in situ (SCCIS) 02/12/2010   Right Cheek(tx p bx)   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 2 09/29/2018   Upper Lip(tx p bx) and Bridge of Nose(tx p bx)   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   Left Forehead, Left Neck, Right Nose, and Right Hand   Stroke (HCC)    Tremor    Weakness 11/20/2020    Past Surgical History:  Procedure Laterality Date   CHOLECYSTECTOMY     skin biopsies      Social History   Socioeconomic History   Marital status: Widowed    Spouse name: Not on file   Number of children: 3   Years of education: Not on file   Highest education level: Not on file  Occupational History   Occupation: Retired  Tobacco Use   Smoking status: Never   Smokeless tobacco: Never  Vaping Use   Vaping status: Never Used  Substance and Sexual Activity   Alcohol use: Never   Drug use: Never   Sexual activity: Not Currently  Other Topics Concern   Not on file  Social History Narrative  Not on file   Social Determinants of Health   Financial Resource Strain: Low Risk  (11/21/2020)   Received from [facility]   Overall Financial Resource Strain (CARDIA)    Difficulty of Paying Living Expenses: Not hard at all  Food Insecurity: No Food Insecurity (11/21/2020)   [facility], [facility]   Hunger Vital Sign    Worried About Running Out of Food in the Last Year: Never true    Ran Out of Food in the Last Year: Never true  Transportation Needs: No Transportation Needs (11/21/2020)   Received from [facility], [facility]   [facility] -  Transportation    Lack of Transportation (Medical): No    Lack of Transportation (Non-Medical): No  Physical Activity: Not on file  Stress: Not on file  Social Connections: Unknown (05/27/2021)   Received from [facility], [facility]   Social Network    Social Network: Not on file  Intimate Partner Violence: Unknown (04/18/2021)   Received from [facility], [facility]   HITS    Physically Hurt: Not on file    Insult or Talk Down To: Not on file    Threaten Physical Harm: Not on file    Scream or Curse: Not on file    Outpatient Encounter Medications as of 11/18/2022  Medication Sig   amLODipine (NORVASC) 5 MG tablet TAKE 1 AND 1/2 TABLETS BY MOUTH DAILY   aspirin EC 81 MG tablet Take 81 mg by mouth 3 (three) times a week.   atorvastatin (LIPITOR) 40 MG tablet TAKE 1 TABLET BY MOUTH AT BEDTIME   clobetasol cream (TEMOVATE) 0.05 % APPLY TO THE AFFECTED AREA(S) TWICE DAILY UNTIL PSORIATIC LESION RESOLVED   lisinopril (ZESTRIL) 30 MG tablet TAKE 1 TABLET BY MOUTH EVERY DAY   polyethylene glycol (MIRALAX / GLYCOLAX) 17 g packet Take 17 g by mouth daily.   primidone (MYSOLINE) 50 MG tablet TAKE 1 AND 1/2 TABLETS BY MOUTH THREE TIMES DAILY   warfarin (COUMADIN) 5 MG tablet Take 2 tablets Tues/ Friday and 1.5 tablets daily all other days (Patient taking differently: Take 1.5 tablets daily all other days)   No facility-administered encounter medications on file as of 11/18/2022.    Allergies  Allergen Reactions   Codeine Other (See Comments)   Influenza Vaccines    Remeron [Mirtazapine] Other (See Comments)    Dizziness    Sulfa Antibiotics Hives    Review of Systems As per HPI  Objective:  There were no vitals taken for this visit.   Wt Readings from Last 3 Encounters:  11/11/22 124 lb (56.2 kg)  11/06/22 125 lb (56.7 kg)  09/24/22 139 lb (63 kg)    Physical Exam Constitutional:      General: She is awake. She is not in acute distress.    Appearance: Normal  appearance. She is well-developed and well-groomed. She is ill-appearing. She is not toxic-appearing or diaphoretic.     Comments: Chronically ill-appearing   Cardiovascular:     Rate and Rhythm: Normal rate and regular rhythm.     Pulses: Normal pulses.          Radial pulses are 2+ on the right side and 2+ on the left side.       Posterior tibial pulses are 2+ on the right side and 2+ on the left side.     Heart sounds: Normal heart sounds. No murmur heard.  No gallop.  Pulmonary:     Effort: Pulmonary effort is normal. No respiratory distress.     Breath sounds: Normal breath sounds. No stridor. No wheezing, rhonchi or rales.  Musculoskeletal:     Cervical back: Full passive range of motion without pain and neck supple.     Right lower leg: No edema.     Left lower leg: No edema.     Comments: Generalized weakness, walking with cane and hand support of daughters   Skin:    General: Skin is warm.     Capillary Refill: Capillary refill takes less than 2 seconds.     Findings: Ecchymosis present.     Comments: Generalized bruising   Neurological:     General: No focal deficit present.     Mental Status: She is alert, oriented to person, place, and time and easily aroused. Mental status is at baseline.     GCS: GCS eye subscore is 4. GCS verbal subscore is 5. GCS motor subscore is 6.     Motor: No weakness.  Psychiatric:        Attention and Perception: Attention and perception normal.        Mood and Affect: Mood and affect normal.        Speech: Speech normal.        Behavior: Behavior normal. Behavior is cooperative.        Thought Content: Thought content normal. Thought content does not include homicidal or suicidal ideation. Thought content does not include homicidal or suicidal plan.        Cognition and Memory: Cognition and memory normal.        Judgment: Judgment normal.    Results for orders placed or performed in visit on 11/09/22  Urine Culture   Specimen: Urine    UR  Result Value Ref Range   Urine Culture, Routine Final report    Organism ID, Bacteria Comment   Urinalysis, Routine w reflex microscopic  Result Value Ref Range   Specific Gravity, UA 1.015 1.005 - 1.030   pH, UA 7.0 5.0 - 7.5   Color, UA Yellow Yellow   Appearance Ur Clear Clear   Leukocytes,UA Negative Negative   Protein,UA Negative Negative/Trace   Glucose, UA Negative Negative   Ketones, UA Negative Negative   RBC, UA Negative Negative   Bilirubin, UA Negative Negative   Urobilinogen, Ur 1.0 0.2 - 1.0 mg/dL   Nitrite, UA Negative Negative       11/18/2022    2:32 PM 11/06/2022    3:17 PM 10/27/2022    1:57 PM 10/23/2022    2:29 PM 07/07/2022    2:43 PM  Depression screen PHQ 2/9  Decreased Interest 0 0 0 0 0  Down, Depressed, Hopeless 0 0 0 0 0  PHQ - 2 Score 0 0 0 0 0  Altered sleeping 0 0 0 0 0  Tired, decreased energy 0 0 0 0 0  Change in appetite 0 0 0 0 0  Feeling bad or failure about yourself  0 0 0 0 0  Trouble concentrating 0 0 0 0 0  Moving slowly or fidgety/restless 0 0 0 0 0  Suicidal thoughts 0 0 0 0 0  PHQ-9 Score 0 0 0 0 0  Difficult doing work/chores Not difficult at all Not difficult at all Not difficult at all Not difficult at all Not difficult at all       11/18/2022    2:33 PM 11/06/2022  3:18 PM 10/27/2022    1:57 PM 10/23/2022    2:29 PM  GAD 7 : Generalized Anxiety Score  Nervous, Anxious, on Edge 0 0 0 0  Control/stop worrying 0 0 0 0  Worry too much - different things 0 0 0 0  Trouble relaxing 0 0 0 0  Restless 0 0 0 0  Easily annoyed or irritable 0 0 0 0  Afraid - awful might happen 0 0 0 0  Total GAD 7 Score 0 0 0 0  Anxiety Difficulty Not difficult at all Not difficult at all  Not difficult at all    Pertinent labs & imaging results that were available during my care of the patient were reviewed by me and considered in my medical decision making.  Assessment & Plan:  Justise was seen today for coagulation  disorder.  Diagnoses and all orders for this visit:  Anticoagulation goal of INR 2 to 3 -     CoaguChek XS/INR Waived -     POCT INR Description   Goal INR 2-3  INR today, 11/18/22 within range, 2.2 Patient to continue on 5 mg daily dosing.  Repeat INR in 2 -4 weeks to ensure stable.    Longstanding persistent atrial fibrillation (HCC) As above -     POCT INR  Cerebrovascular accident (CVA) due to occlusion of left middle cerebral artery (HCC) As above.  -     POCT INR  Continue all other maintenance medications.  Follow up plan: Return in about 2 weeks (around 12/02/2022) for INR recheck .  Written and verbal instructions provided   The above assessment and management plan was discussed with the patient. The patient verbalized understanding of and has agreed to the management plan. Patient is aware to call the clinic if they develop any new symptoms or if symptoms persist or worsen. Patient is aware when to return to the clinic for a follow-up visit. Patient educated on when it is appropriate to go to the emergency department.   Neale Burly, DNP-FNP [facility] [address] [place], [place] [address] [phone]

## 2022-11-19 NOTE — Progress Notes (Signed)
Discussed in visit. Patient to continue at current dose. Follow up in 2 weeks.

## 2022-12-03 ENCOUNTER — Ambulatory Visit: Payer: Medicare Other | Admitting: Cardiology

## 2022-12-08 ENCOUNTER — Encounter: Payer: Self-pay | Admitting: Family Medicine

## 2022-12-08 ENCOUNTER — Ambulatory Visit: Payer: Medicare Other | Admitting: Family Medicine

## 2022-12-08 VITALS — BP 110/72 | HR 80 | Temp 98.8°F | Ht 62.0 in | Wt 125.0 lb

## 2022-12-08 DIAGNOSIS — I4811 Longstanding persistent atrial fibrillation: Secondary | ICD-10-CM | POA: Diagnosis not present

## 2022-12-08 DIAGNOSIS — G479 Sleep disorder, unspecified: Secondary | ICD-10-CM

## 2022-12-08 DIAGNOSIS — Z7901 Long term (current) use of anticoagulants: Secondary | ICD-10-CM | POA: Diagnosis not present

## 2022-12-08 DIAGNOSIS — Z5181 Encounter for therapeutic drug level monitoring: Secondary | ICD-10-CM | POA: Diagnosis not present

## 2022-12-08 LAB — COAGUCHEK XS/INR WAIVED
INR: 1.9 — ABNORMAL HIGH (ref 0.9–1.1)
Prothrombin Time: 22.9 s

## 2022-12-08 MED ORDER — BUSPIRONE HCL 5 MG PO TABS: 5.0000 mg | ORAL_TABLET | Freq: Every evening | ORAL | 3 refills | Status: DC | PRN

## 2022-12-08 NOTE — Progress Notes (Signed)
Subjective: CC: INR recheck PCP: [name], DO EAV:WUJW Dominique Farley is a 89 y.o. female presenting to clinic today for:  1.  INR recheck Patient here for interval INR check.  She is brought to the office by her daughter and home health provider.  She is compliant with her medications.  No bleeding reported.  No changes in diet etc.  Has known history of atrial fibrillation with goal INR 2-3.  2.  Sleep difficulty She is now having 2 different health aides that come in at nighttime and stay overnight.  Her daughter seems to believe that she is too busy listening and keeping an eye on these new ladies to get rested and fall asleep easily.  They gave her 3 mg of melatonin but notes that this was not really helpful.  Has history of dizziness with mirtazapine.   ROS: Per HPI  Allergies  Allergen Reactions   Codeine Other (See Comments)   Influenza Vaccines    Remeron [Mirtazapine] Other (See Comments)    Dizziness    Sulfa Antibiotics Hives   Past Medical History:  Diagnosis Date   Acute cystitis with hematuria 11/22/2020   Atrial fibrillation (HCC)    BCC (basal cell carcinoma of skin) 11/14/2004   Right Mid Cheek(Cx3,5FU)   BCC (basal cell carcinoma of skin) 04/16/2011   Right Crease(tx p bx)   BCC (basal cell carcinoma of skin) 06/16/2016   Left Nare(tx p bx)   BCC (basal cell carcinoma of skin) Atypical Basaloid 09/29/2018   Left Side Nose(tx p bx)   Hypertension    Osteoporosis    Pneumonia of lower lobe due to infectious organism 11/20/2020   SCC (squamous cell carcinoma) 09/29/2018   Left Jawline(tx p bx)   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   Right Jawline(Cx3,5FU) and Left Upper Cheek(Cx3,5FU)   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   Left Jawline(tx p bx), Nose(tx p bx), Right Cheek(tx p bx), and Right Forearm(tx p bx)   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   Right Temple(Cx3,5FU) and Left Outer Eye(Cx3,5FU)   Squamous cell  carcinoma in situ (SCCIS) 11/14/2004   Right Forearm(Cx3,5FU)   Squamous cell carcinoma in situ (SCCIS) 03/13/2005   Left Side of Nose(Cx3,5FU)   Squamous cell carcinoma in situ (SCCIS) 06/04/2005   Inner Right Cheek/Eye(Tx p Bx)   Squamous cell carcinoma in situ (SCCIS) 02/12/2010   Right Cheek(tx p bx)   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 2 09/29/2018   Upper Lip(tx p bx) and Bridge of Nose(tx p bx)   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   Left Forehead, Left Neck, Right Nose, and Right Hand   Stroke (HCC)    Tremor    Weakness 11/20/2020    Current Outpatient Medications:    amLODipine (NORVASC) 5 MG tablet, TAKE 1 AND 1/2 TABLETS BY MOUTH DAILY, Disp: 135 tablet, Rfl: 0   aspirin EC 81 MG tablet, Take 81 mg by mouth 3 (three) times a week., Disp: , Rfl:    atorvastatin (LIPITOR) 40 MG tablet, TAKE 1 TABLET BY MOUTH AT BEDTIME, Disp: 90 tablet, Rfl: 0   clobetasol cream (TEMOVATE) 0.05 %, APPLY TO THE AFFECTED AREA(S) TWICE DAILY UNTIL PSORIATIC LESION RESOLVED, Disp: 120 g, Rfl: 2   lisinopril (ZESTRIL) 30 MG tablet, TAKE 1 TABLET BY MOUTH EVERY DAY, Disp: 90 tablet, Rfl: 0   polyethylene glycol (MIRALAX / GLYCOLAX) 17 g packet, Take 17  g by mouth daily., Disp: , Rfl:    primidone (MYSOLINE) 50 MG tablet, TAKE 1 AND 1/2 TABLETS BY MOUTH THREE TIMES DAILY, Disp: 405 tablet, Rfl: 0   warfarin (COUMADIN) 5 MG tablet, Take 2 tablets Tues/ Friday and 1.5 tablets daily all other days (Patient taking differently: Take 1.5 tablets daily all other days), Disp: 135 tablet, Rfl: 3 Social History   Socioeconomic History   Marital status: Widowed    Spouse name: Not on file   Number of children: 3   Years of education: Not on file   Highest education level: Not on file  Occupational History   Occupation: Retired  Tobacco Use   Smoking status: Never   Smokeless tobacco: Never  Vaping Use   Vaping status: Never Used  Substance and Sexual Activity   Alcohol use:  Never   Drug use: Never   Sexual activity: Not Currently  Other Topics Concern   Not on file  Social History Narrative   Not on file   Social Determinants of Health   Financial Resource Strain: Low Risk  (11/21/2020)   Received from [facility], [facility]   Overall Financial Resource Strain (CARDIA)    Difficulty of Paying Living Expenses: Not hard at all  Food Insecurity: No Food Insecurity (11/21/2020)   Received from [facility], [facility]   Hunger Vital Sign    Worried About Running Out of Food in the Last Year: Never true    Ran Out of Food in the Last Year: Never true  Transportation Needs: No Transportation Needs (11/21/2020)   [facility]   [facility] - Transportation    Lack of Transportation (Medical): No    Lack of Transportation (Non-Medical): No  Physical Activity: Not on file  Stress: Not on file  Social Connections: Unknown (05/27/2021)   [facility] from [facility], [facility]   Social Network    Social Network: Not on file  Intimate Partner Violence: Unknown (04/18/2021)   [facility] from [facility], [facility]   HITS    Physically Hurt: Not on file    Insult or Talk Down To: Not on file    Threaten Physical Harm: Not on file    Scream or Curse: Not on file   Family History  Problem Relation Age of Onset   Dementia Mother    Stroke Father    Heart disease Father    Diabetes Sister    Stroke Son     Objective: Office vital signs reviewed. BP 110/72   Pulse 80   Temp 98.8 F (37.1 C)   Ht 5\' 2"  (1.575 m)   Wt 125 lb (56.7 kg)   SpO2 92%   BMI 22.86 kg/m   Physical Examination:  General: Awake, alert, well nourished, No acute distress HEENT: sclera white, MMM Cardio: regular rate and rhythm, S1S2 heard, no murmurs appreciated Pulm: clear to auscultation bilaterally, no wheezes, rhonchi or rales; normal work of breathing on room air      12/08/2022    2:23 PM 11/18/2022    2:32 PM  11/06/2022    3:17 PM  Depression screen PHQ 2/9  Decreased Interest 0 0 0  Down, Depressed, Hopeless 0 0 0  PHQ - 2 Score 0 0 0  Altered sleeping 0 0 0  Tired, decreased energy 0 0 0  Change in appetite 0 0 0  Feeling bad or failure about yourself  0 0  0  Trouble concentrating 0 0 0  Moving slowly or fidgety/restless 0 0 0  Suicidal thoughts 0 0 0  PHQ-9 Score 0 0 0  Difficult doing work/chores Not difficult at all Not difficult at all Not difficult at all      12/08/2022    2:24 PM 11/18/2022    2:33 PM 11/06/2022    3:18 PM 10/27/2022    1:57 PM  GAD 7 : Generalized Anxiety Score  Nervous, Anxious, on Edge 0 0 0 0  Control/stop worrying 0 0 0 0  Worry too much - different things 0 0 0 0  Trouble relaxing 0 0 0 0  Restless 0 0 0 0  Easily annoyed or irritable 0 0 0 0  Afraid - awful might happen 0 0 0 0  Total GAD 7 Score 0 0 0 0  Anxiety Difficulty Not difficult at all Not difficult at all Not difficult at all     Assessment/ Plan: 89 y.o. female   Longstanding persistent atrial fibrillation (HCC) - Plan: CoaguChek XS/INR Waived  Anticoagulation goal of INR 2 to 3 - Plan: CoaguChek XS/INR Waived  Sleep difficulties - Plan: busPIRone (BUSPAR) 5 MG tablet  INR is slightly subtherapeutic so I will have her increase to 7.5 mg today then resume 5 mg daily thereafter.  She may follow-up in 2 months, sooner if concerns arise.  Trial of use Paron at bedtime.  We discussed max dose of melatonin at 5 mg.   [name], DO [facility] [phone]

## 2022-12-08 NOTE — Patient Instructions (Signed)
1.5 tablets today. Then back to 1 tablet daily.  Buspar added for nerves at night if needed to calm and sleep  Ok for Melatonin up to 5mg  per night.

## 2022-12-09 ENCOUNTER — Ambulatory Visit: Payer: Medicare Other | Admitting: Orthopedic Surgery

## 2022-12-09 ENCOUNTER — Other Ambulatory Visit (INDEPENDENT_AMBULATORY_CARE_PROVIDER_SITE_OTHER): Payer: Medicare Other

## 2022-12-09 DIAGNOSIS — S62614D Displaced fracture of proximal phalanx of right ring finger, subsequent encounter for fracture with routine healing: Secondary | ICD-10-CM

## 2022-12-09 DIAGNOSIS — S62616D Displaced fracture of proximal phalanx of right little finger, subsequent encounter for fracture with routine healing: Secondary | ICD-10-CM

## 2022-12-09 DIAGNOSIS — S62612D Displaced fracture of proximal phalanx of right middle finger, subsequent encounter for fracture with routine healing: Secondary | ICD-10-CM

## 2022-12-10 ENCOUNTER — Encounter: Payer: Self-pay | Admitting: Orthopedic Surgery

## 2022-12-10 NOTE — Progress Notes (Signed)
Return patient Visit  Assessment: Dominique Farley is a 89 y.o. female with the following: 1. Closed displaced fracture of proximal phalanx of right little finger, subsequent encounter 2. Closed displaced fracture of proximal phalanx of right ring finger, subsequent encounter 3. Closed displaced fracture of proximal phalanx of right middle finger, subsequent encounter 4. Right lower leg skin tear   Plan: Dominique Farley sustained minimally displaced fractures of the right long, ring and small fingers.  She has done well.  Radiographs remain stable.  She does not have pain.  She is able to make a fist.  Her function is improving.  Her strength is getting better.  No restrictions.  Gradual improvement in strength and function.  If she has any further issues she will contact the clinic.  Follow-up: Return if symptoms worsen or fail to improve.  Subjective:  Chief Complaint  Patient presents with   Hand Pain    R/ doing ok. She has done the exercises at home.    History of Present Illness: Dominique Farley is a 89 y.o. female who returns for evaluation of right hand pain.  She sustained fractures to the fingers on her right hand almost 3 months ago.  She is doing very well.  Her motion is improving.  She is using her hand more.  She is not complaining of pain.  Review of Systems: No fevers or chills No numbness or tingling No chest pain No shortness of breath No bowel or bladder dysfunction No GI distress No headaches   Objective: There were no vitals taken for this visit.  Physical Exam:  General: Elderly female., Alert and oriented., No acute distress., and Seated in a wheelchair.   Evaluation of the right hand demonstrates no swelling.  Mild discoloration.  Mild ulnar deviation of the long, ring and small finger.  No tenderness to palpation.  She is able to make a fist.  She has improving grip strength.  Fingers are warm and well-perfused.   IMAGING: I personally ordered and reviewed  the following images  X-rays of the right hand were obtained in clinic today.  These are compared to prior x-rays.  Fractures at the base of the proximal phalanx to the long ring and small fingers remain stable.  No further displacement.  There has been interval consolidation.  No additional injuries noted at this time.  No bony lesions.  Impression: Healed right long, ring and small finger proximal phalanx fractures   New Medications:  No orders of the defined types were placed in this encounter.     Oliver Barre, MD  12/10/2022 1:28 PM

## 2022-12-24 DIAGNOSIS — M79672 Pain in left foot: Secondary | ICD-10-CM | POA: Diagnosis not present

## 2022-12-24 DIAGNOSIS — L110 Acquired keratosis follicularis: Secondary | ICD-10-CM | POA: Diagnosis not present

## 2022-12-24 DIAGNOSIS — I739 Peripheral vascular disease, unspecified: Secondary | ICD-10-CM | POA: Diagnosis not present

## 2022-12-24 DIAGNOSIS — M79671 Pain in right foot: Secondary | ICD-10-CM | POA: Diagnosis not present

## 2022-12-25 ENCOUNTER — Encounter: Payer: Self-pay | Admitting: Family Medicine

## 2022-12-28 ENCOUNTER — Other Ambulatory Visit: Payer: Self-pay

## 2022-12-28 ENCOUNTER — Telehealth: Payer: Self-pay | Admitting: Family Medicine

## 2022-12-28 DIAGNOSIS — G479 Sleep disorder, unspecified: Secondary | ICD-10-CM

## 2022-12-28 MED ORDER — BUSPIRONE HCL 5 MG PO TABS: 5.0000 mg | ORAL_TABLET | Freq: Every evening | ORAL | 3 refills | Status: DC | PRN

## 2022-12-28 NOTE — Telephone Encounter (Signed)
Copied from CRM [id number]. Topic: Clinical - Medication Question >> Dec 28, 2022  1:59 PM Mosetta Putt H wrote: Reason for CRM: busPIRone (BUSPAR) 5 MG tablet warfarin (COUMADIN) 5 MG tablet needs updated prescription

## 2022-12-29 ENCOUNTER — Other Ambulatory Visit: Payer: Self-pay | Admitting: Family Medicine

## 2022-12-29 DIAGNOSIS — I4811 Longstanding persistent atrial fibrillation: Secondary | ICD-10-CM

## 2022-12-29 DIAGNOSIS — G479 Sleep disorder, unspecified: Secondary | ICD-10-CM

## 2022-12-29 DIAGNOSIS — I63512 Cerebral infarction due to unspecified occlusion or stenosis of left middle cerebral artery: Secondary | ICD-10-CM

## 2022-12-29 MED ORDER — BUSPIRONE HCL 10 MG PO TABS: 10.0000 mg | ORAL_TABLET | Freq: Every evening | ORAL | 0 refills | Status: DC | PRN

## 2022-12-29 NOTE — Telephone Encounter (Signed)
Name from pharmacy: buspirone 5 mg tablet  pharmacy: Patient's daughter said they were told to have her take 2 tablets at bedime, could we possibly get a new rx sent in for that? Thank you so much!

## 2023-01-17 ENCOUNTER — Other Ambulatory Visit: Payer: Self-pay | Admitting: Family Medicine

## 2023-01-17 DIAGNOSIS — G250 Essential tremor: Secondary | ICD-10-CM

## 2023-01-22 ENCOUNTER — Other Ambulatory Visit: Payer: Self-pay | Admitting: Family Medicine

## 2023-01-22 DIAGNOSIS — H01005 Unspecified blepharitis left lower eyelid: Secondary | ICD-10-CM

## 2023-01-28 ENCOUNTER — Encounter (INDEPENDENT_AMBULATORY_CARE_PROVIDER_SITE_OTHER): Payer: Medicare Other | Admitting: Family Medicine

## 2023-01-28 ENCOUNTER — Other Ambulatory Visit: Payer: Self-pay | Admitting: Family Medicine

## 2023-01-28 DIAGNOSIS — H01004 Unspecified blepharitis left upper eyelid: Secondary | ICD-10-CM | POA: Diagnosis not present

## 2023-01-28 DIAGNOSIS — H01001 Unspecified blepharitis right upper eyelid: Secondary | ICD-10-CM | POA: Diagnosis not present

## 2023-01-28 DIAGNOSIS — H01005 Unspecified blepharitis left lower eyelid: Secondary | ICD-10-CM

## 2023-01-29 MED ORDER — ERYTHROMYCIN 5 MG/GM OP OINT: 1.0000 | TOPICAL_OINTMENT | Freq: Two times a day (BID) | OPHTHALMIC | 0 refills | Status: AC

## 2023-01-29 NOTE — Telephone Encounter (Signed)
Please see the MyChart message reply(ies) for my assessment and plan.    This patient gave consent for this Medical Advice Message and is aware that it may result in a bill to their insurance company, as well as the possibility of receiving a bill for a co-payment or deductible. They are an established patient, but are not seeking medical advice exclusively about a problem treated during an in person or video visit in the last seven days. I did not recommend an in person or video visit within seven days of my reply.    I spent a total of 5 minutes cumulative time within 7 days through MyChart messaging.  Breyana Follansbee, DO   

## 2023-02-04 ENCOUNTER — Ambulatory Visit: Payer: Medicare Other | Attending: Cardiology | Admitting: Cardiology

## 2023-02-04 ENCOUNTER — Encounter: Payer: Self-pay | Admitting: Cardiology

## 2023-02-04 VITALS — BP 120/54 | HR 56 | Ht 60.0 in | Wt 126.0 lb

## 2023-02-04 DIAGNOSIS — Z79899 Other long term (current) drug therapy: Secondary | ICD-10-CM | POA: Diagnosis not present

## 2023-02-04 DIAGNOSIS — D6869 Other thrombophilia: Secondary | ICD-10-CM

## 2023-02-04 DIAGNOSIS — E782 Mixed hyperlipidemia: Secondary | ICD-10-CM

## 2023-02-04 DIAGNOSIS — I4811 Longstanding persistent atrial fibrillation: Secondary | ICD-10-CM

## 2023-02-04 DIAGNOSIS — I10 Essential (primary) hypertension: Secondary | ICD-10-CM

## 2023-02-04 MED ORDER — LISINOPRIL 20 MG PO TABS: 20.0000 mg | ORAL_TABLET | Freq: Every day | ORAL | 1 refills | Status: DC

## 2023-02-04 MED ORDER — LISINOPRIL 30 MG PO TABS: 30.0000 mg | ORAL_TABLET | Freq: Every day | ORAL | 1 refills | Status: DC

## 2023-02-04 NOTE — Progress Notes (Signed)
Clinical Summary Dominique Farley is a 89 y.o.female seen today for follow up of the following medical problems.      1. Long standing persistent afib - diagnosed 10/2018 during 10/2018 - no recent palpitations - no lightheadendess or dizziness. - doing well on coumadin. Could not use DOAC due to being on primidone. INR follwoed by pcp       - low HRs recent pcp visit, lowered propranolol to 10mg  bid. From pcp note would be ok coming off propanolol if neccesary and treating tremor with just primidone.  - feels nervous inside.  - no bleeding on coumadin.    07/2021 3 day monitor: Patient in afib throughout study. Rates 36-112, Avg HR 62  - after monitor given low HRs propranolol was stopped   - no recent palpitations - some recent unsteadiness on her feet. In September had a fall, unclear history of what happened.  - at times can have decreased oral intake.  - no bleeding on coumadin     2. History of CVA - has been on ASA 81mg  every other day it appears - admit 10/2018 at [facility], left middle cerebral. Recs were for coumadin at that time and ASA   - left MCA thrombectomy   3.HTN - has some component of white coat HTN     - compliant with meds - home bp's 120s/70s     4. Tremor - she is on propranolol, primidone     5.Hyperlipidemia - labs per pcp - she is on atorvastatin Past Medical History:  Diagnosis Date   Acute cystitis with hematuria 11/22/2020   Atrial fibrillation (HCC)    BCC (basal cell carcinoma of skin) 11/14/2004   Right Mid Cheek(Cx3,5FU)   BCC (basal cell carcinoma of skin) 04/16/2011   Right Crease(tx p bx)   BCC (basal cell carcinoma of skin) 06/16/2016   Left Nare(tx p bx)   BCC (basal cell carcinoma of skin) Atypical Basaloid 09/29/2018   Left Side Nose(tx p bx)   Hypertension    Osteoporosis    Pneumonia of lower lobe due to infectious organism 11/20/2020   SCC (squamous cell carcinoma) 09/29/2018   Left Jawline(tx p  bx)   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   Right Jawline(Cx3,5FU) and Left Upper Cheek(Cx3,5FU)   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   Left Jawline(tx p bx), Nose(tx p bx), Right Cheek(tx p bx), and Right Forearm(tx p bx)   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   Right Temple(Cx3,5FU) and Left Outer Eye(Cx3,5FU)   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   Right Forearm(Cx3,5FU)   Squamous cell carcinoma in situ (SCCIS) 03/13/2005   Left Side of Nose(Cx3,5FU)   Squamous cell carcinoma in situ (SCCIS) 06/04/2005   Inner Right Cheek/Eye(Tx p Bx)   Squamous cell carcinoma in situ (SCCIS) 02/12/2010   Right Cheek(tx p bx)   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 2 09/29/2018   Upper Lip(tx p bx) and Bridge of Nose(tx p bx)   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   Left Forehead, Left Neck, Right Nose, and Right Hand   Stroke (HCC)    Tremor    Weakness 11/20/2020     Allergies  Allergen Reactions   Codeine Other (See Comments)   Influenza Vaccines    Remeron [Mirtazapine] Other (See Comments)    Dizziness    Sulfa Antibiotics Hives  Current Outpatient Medications  Medication Sig Dispense Refill   amLODipine (NORVASC) 5 MG tablet TAKE 1 AND 1/2 TABLETS BY MOUTH DAILY 135 tablet 0   aspirin EC 81 MG tablet Take 81 mg by mouth 3 (three) times a week.     atorvastatin (LIPITOR) 40 MG tablet TAKE 1 TABLET BY MOUTH AT BEDTIME 90 tablet 0   busPIRone (BUSPAR) 10 MG tablet Take 1 tablet (10 mg total) by mouth at bedtime as needed. 180 tablet 0   clobetasol cream (TEMOVATE) 0.05 % APPLY TO THE AFFECTED AREA(S) TWICE DAILY UNTIL PSORIATIC LESION RESOLVED 120 g 2   erythromycin ophthalmic ointment Place 1 Application into both eyes in the morning and at bedtime for 7 days. 3.5 g 0   lisinopril (ZESTRIL) 30 MG tablet TAKE 1 TABLET BY MOUTH EVERY DAY 90 tablet 0   polyethylene glycol (MIRALAX / GLYCOLAX) 17 g packet Take  17 g by mouth daily.     primidone (MYSOLINE) 50 MG tablet TAKE 1 AND 1/2 TABLETS BY MOUTH THREE TIMES DAILY 405 tablet 0   warfarin (COUMADIN) 5 MG tablet TAKE 1 AND 1/2 TABLETS BY MOUTH EVERY DAY EXCEPT TAKE 2 TABLETS ON TUESDAY AND FRIDAY 135 tablet 0   No current facility-administered medications for this visit.     Past Surgical History:  Procedure Laterality Date   CHOLECYSTECTOMY     skin biopsies       Allergies  Allergen Reactions   Codeine Other (See Comments)   Influenza Vaccines    Remeron [Mirtazapine] Other (See Comments)    Dizziness    Sulfa Antibiotics Hives      Family History  Problem Relation Age of Onset   Dementia Mother    Stroke Father    Heart disease Father    Diabetes Sister    Stroke Son      Social History Dominique Farley reports that she has never smoked. She has never used smokeless tobacco. Dominique Farley reports no history of alcohol use.   Review of Systems CONSTITUTIONAL: No weight loss, fever, chills, weakness or fatigue.  HEENT: Eyes: No visual loss, blurred vision, double vision or yellow sclerae.No hearing loss, sneezing, congestion, runny nose or sore throat.  SKIN: No rash or itching.  CARDIOVASCULAR: per hpi RESPIRATORY: No shortness of breath, cough or sputum.  GASTROINTESTINAL: No anorexia, nausea, vomiting or diarrhea. No abdominal pain or blood.  GENITOURINARY: No burning on urination, no polyuria NEUROLOGICAL: No headache, dizziness, syncope, paralysis, ataxia, numbness or tingling in the extremities. No change in bowel or bladder control.  MUSCULOSKELETAL: No muscle, back pain, joint pain or stiffness.  LYMPHATICS: No enlarged nodes. No history of splenectomy.  PSYCHIATRIC: No history of depression or anxiety.  ENDOCRINOLOGIC: No reports of sweating, cold or heat intolerance. No polyuria or polydipsia.  Marland Kitchen   Physical Examination Today's Vitals   02/04/23 1510  BP: (!) 120/54  Pulse: (!) 56  Weight: 126 lb (57.2 kg)   Height: 5' (1.524 m)   Body mass index is 24.61 kg/m.  Gen: resting comfortably, no acute distress HEENT: no scleral icterus, pupils equal round and reactive, no palptable cervical adenopathy,  CV: irreg, no m/rg, no jvd Resp: Clear to auscultation bilaterally GI: abdomen is soft, non-tender, non-distended, normal bowel sounds, no hepatosplenomegaly MSK: extremities are warm, no edema.  Skin: warm, no rash Neuro:  no focal deficits Psych: appropriate affect   Diagnostic Studies 07/2021 3 day monitor 3 day monitor   Patient in afib throughout  study. Rates 36-112, Avg HR 62   Rare ventricular ectopy in the form of PVCs   No symptoms reported     Patch Wear Time:  3 days and 18 hours (2023-06-23T15:57:54-0400 to 2023-06-27T10:03:52-0400)   Atrial Fibrillation occurred continuously (100% burden), ranging from 36-112 bpm (avg of 62 bpm). Isolated VEs were rare (<1.0%), and no VE Couplets or VE Triplets were present.    Assessment and Plan   1. Afib/acquired thrombophilia - on coumadin, could not take DOAC due to interaction with primidone - low HRs at times on monitor, we stopped her propanolol - continue current therapy - EKG today shows rate controlled afib   2. HTN - some recent orthostatic dizziness, will lower lisinopril to 20mg  daily. Encouraged regular oral hydration.    3.Hyperlipidemia - ask pcp to update labs at there upcoming visit    Antoine Poche, M.D.

## 2023-02-04 NOTE — Addendum Note (Signed)
Addended by: Lesle Chris on: 02/04/2023 04:28 PM   Modules accepted: Orders

## 2023-02-04 NOTE — Patient Instructions (Signed)
Medication Instructions:   Decrease Lisinopril to 20mg  daily  Continue all other medications.     Labwork:  FLP - order given today  Reminder:  Nothing to eat or drink after 12 midnight prior to labs. Office will contact with results via phone, letter or mychart.     Testing/Procedures:  none  Follow-Up:  6 months   Any Other Special Instructions Will Be Listed Below (If Applicable).   If you need a refill on your cardiac medications before your next appointment, please call your pharmacy.

## 2023-02-12 ENCOUNTER — Encounter: Payer: Self-pay | Admitting: Family Medicine

## 2023-02-12 ENCOUNTER — Ambulatory Visit (INDEPENDENT_AMBULATORY_CARE_PROVIDER_SITE_OTHER): Payer: Medicare Other | Admitting: Family Medicine

## 2023-02-12 VITALS — BP 132/76 | HR 80 | Temp 98.7°F | Ht 60.0 in | Wt 129.0 lb

## 2023-02-12 DIAGNOSIS — Z7901 Long term (current) use of anticoagulants: Secondary | ICD-10-CM | POA: Diagnosis not present

## 2023-02-12 DIAGNOSIS — Z5181 Encounter for therapeutic drug level monitoring: Secondary | ICD-10-CM

## 2023-02-12 DIAGNOSIS — I4811 Longstanding persistent atrial fibrillation: Secondary | ICD-10-CM

## 2023-02-12 DIAGNOSIS — R791 Abnormal coagulation profile: Secondary | ICD-10-CM | POA: Diagnosis not present

## 2023-02-12 DIAGNOSIS — Z8673 Personal history of transient ischemic attack (TIA), and cerebral infarction without residual deficits: Secondary | ICD-10-CM

## 2023-02-12 DIAGNOSIS — E782 Mixed hyperlipidemia: Secondary | ICD-10-CM | POA: Diagnosis not present

## 2023-02-12 DIAGNOSIS — Z79899 Other long term (current) drug therapy: Secondary | ICD-10-CM | POA: Diagnosis not present

## 2023-02-12 LAB — COAGUCHEK XS/INR WAIVED
INR: 1.2 — ABNORMAL HIGH (ref 0.9–1.1)
Prothrombin Time: 14.9 s

## 2023-02-12 NOTE — Progress Notes (Signed)
Subjective: WU:JWJX PCP: [name], DO BJY:NWGN [place] is a 89 y.o. female presenting to clinic today for:  1. Afib Compliant with Coumadin 5 mg daily.  No reports of bleeding.   ROS: Per HPI  Allergies  Allergen Reactions   Codeine Other (See Comments)   Influenza Vaccines    Remeron [Mirtazapine] Other (See Comments)    Dizziness    Sulfa Antibiotics Hives   Past Medical History:  Diagnosis Date   Acute cystitis with hematuria 11/22/2020   Atrial fibrillation (HCC)    BCC (basal cell carcinoma of skin) 11/14/2004   Right Mid Cheek(Cx3,5FU)   BCC (basal cell carcinoma of skin) 04/16/2011   Right Crease(tx p bx)   BCC (basal cell carcinoma of skin) 06/16/2016   Left Nare(tx p bx)   BCC (basal cell carcinoma of skin) Atypical Basaloid 09/29/2018   Left Side Nose(tx p bx)   Hypertension    Osteoporosis    Pneumonia of lower lobe due to infectious organism 11/20/2020   SCC (squamous cell carcinoma) 09/29/2018   Left Jawline(tx p bx)   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   Right Jawline(Cx3,5FU) and Left Upper Cheek(Cx3,5FU)   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   Left Jawline(tx p bx), Nose(tx p bx), Right Cheek(tx p bx), and Right Forearm(tx p bx)   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   Right Temple(Cx3,5FU) and Left Outer Eye(Cx3,5FU)   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   Right Forearm(Cx3,5FU)   Squamous cell carcinoma in situ (SCCIS) 03/13/2005   Left Side of Nose(Cx3,5FU)   Squamous cell carcinoma in situ (SCCIS) 06/04/2005   Inner Right Cheek/Eye(Tx p Bx)   Squamous cell carcinoma in situ (SCCIS) 02/12/2010   Right Cheek(tx p bx)   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 2 09/29/2018   Upper Lip(tx p bx) and Bridge of Nose(tx p bx)   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   Left Forehead, Left Neck, Right Nose, and Right Hand   Stroke (HCC)    Tremor    Weakness 11/20/2020     Current Outpatient Medications:    amLODipine (NORVASC) 5 MG tablet, TAKE 1 AND 1/2 TABLETS BY MOUTH DAILY, Disp: 135 tablet, Rfl: 0   aspirin EC 81 MG tablet, Take 81 mg by mouth 3 (three) times a week., Disp: , Rfl:    atorvastatin (LIPITOR) 40 MG tablet, TAKE 1 TABLET BY MOUTH AT BEDTIME, Disp: 90 tablet, Rfl: 0   busPIRone (BUSPAR) 10 MG tablet, Take 1 tablet (10 mg total) by mouth at bedtime as needed., Disp: 180 tablet, Rfl: 0   clobetasol cream (TEMOVATE) 0.05 %, APPLY TO THE AFFECTED AREA(S) TWICE DAILY UNTIL PSORIATIC LESION RESOLVED, Disp: 120 g, Rfl: 2   lisinopril (ZESTRIL) 20 MG tablet, Take 1 tablet (20 mg total) by mouth daily., Disp: 90 tablet, Rfl: 1   polyethylene glycol (MIRALAX / GLYCOLAX) 17 g packet, Take 17 g by mouth daily., Disp: , Rfl:    primidone (MYSOLINE) 50 MG tablet, TAKE 1 AND 1/2 TABLETS BY MOUTH THREE TIMES DAILY, Disp: 405 tablet, Rfl: 0   warfarin (COUMADIN) 5 MG tablet, TAKE 1 AND 1/2 TABLETS BY MOUTH EVERY DAY EXCEPT TAKE 2 TABLETS ON TUESDAY AND FRIDAY, Disp: 135 tablet, Rfl: 0 Social History   Socioeconomic History   Marital status: Widowed    Spouse name: Not on file   Number of children: 3  Years of education: Not on file   Highest education level: Not on file  Occupational History   Occupation: Retired  Tobacco Use   Smoking status: Never   Smokeless tobacco: Never  Vaping Use   Vaping status: Never Used  Substance and Sexual Activity   Alcohol use: Never   Drug use: Never   Sexual activity: Not Currently  Other Topics Concern   Not on file  Social History Narrative   Not on file   Social Drivers of Health   Financial Resource Strain: Low Risk  (11/21/2020)   Received from [facility], [facility]   Overall Financial Resource Strain (CARDIA)    Difficulty of Paying Living Expenses: Not hard at all  Food Insecurity: No Food Insecurity (11/21/2020)   Received from [facility], [facility]   Hunger Vital Sign     Worried About Running Out of Food in the Last Year: Never true    Ran Out of Food in the Last Year: Never true  Transportation Needs: No Transportation Needs (11/21/2020)   Received from [facility], [facility]   [facility] - Transportation    Lack of Transportation (Medical): No    Lack of Transportation (Non-Medical): No  Physical Activity: Not on file  Stress: Not on file  Social Connections: Unknown (05/27/2021)   Received from [facility], [facility]   Social Network    Social Network: Not on file  Intimate Partner Violence: Unknown (04/18/2021)   Received from [facility], [facility]   HITS    Physically Hurt: Not on file    Insult or Talk Down To: Not on file    Threaten Physical Harm: Not on file    Scream or Curse: Not on file   Family History  Problem Relation Age of Onset   Dementia Mother    Stroke Father    Heart disease Father    Diabetes Sister    Stroke Son     Objective: Office vital signs reviewed. BP 132/76   Pulse 80   Temp 98.7 F (37.1 C)   Ht 5' (1.524 m)   Wt 129 lb (58.5 kg)   SpO2 95%   BMI 25.19 kg/m   Physical Examination:  General: Awake, alert, thin elderly female, No acute distress HEENT: Sclera white.  Moist mucous membranes Cardio: Irregularly irregular with rate control, S1S2 heard, no murmurs appreciated Pulm: clear to auscultation bilaterally, no wheezes, rhonchi or rales; normal work of breathing on room air  Assessment/ Plan: 89 y.o. female   Longstanding persistent atrial fibrillation (HCC) - Plan: CoaguChek XS/INR Waived, CBC with Differential, CMP14+EGFR  Anticoagulation goal of INR 2 to 3 - Plan: CoaguChek XS/INR Waived, CBC with Differential, CMP14+EGFR  History of CVA in adulthood - Plan: CMP14+EGFR  Subtherapeutic international normalized ratio (INR)  INR subtherapeutic at 1.2 today.  Uncertain etiology as she has not had any medication changes, changes in diet.  I have advised her to increase to 10  mg today then resume 5 mg daily.  Close follow-up with my partner, Marcelino Duster will be next Friday.  Family aware of date and time and instructions.  If persistently low, recommend resuming 1.5 tablets 2 days/week and continuing 1 tablet daily all others and following up with me within 2 weeks.  Okay to put on SDA appointment slot if needed   [name], DO [facility] [phone]

## 2023-02-13 ENCOUNTER — Encounter: Payer: Self-pay | Admitting: Family Medicine

## 2023-02-13 LAB — CBC WITH DIFFERENTIAL/PLATELET
Basophils Absolute: 0 10*3/uL (ref 0.0–0.2)
Basos: 0 %
EOS (ABSOLUTE): 0.2 10*3/uL (ref 0.0–0.4)
Eos: 2 %
Hematocrit: 37.3 % (ref 34.0–46.6)
Hemoglobin: 11.5 g/dL (ref 11.1–15.9)
Immature Grans (Abs): 0 10*3/uL (ref 0.0–0.1)
Immature Granulocytes: 0 %
Lymphocytes Absolute: 2.9 10*3/uL (ref 0.7–3.1)
Lymphs: 32 %
MCH: 29.2 pg (ref 26.6–33.0)
MCHC: 30.8 g/dL — ABNORMAL LOW (ref 31.5–35.7)
MCV: 95 fL (ref 79–97)
Monocytes Absolute: 0.8 10*3/uL (ref 0.1–0.9)
Monocytes: 9 %
Neutrophils Absolute: 5.1 10*3/uL (ref 1.4–7.0)
Neutrophils: 57 %
Platelets: 319 10*3/uL (ref 150–450)
RBC: 3.94 x10E6/uL (ref 3.77–5.28)
RDW: 14.7 % (ref 11.7–15.4)
WBC: 9.1 10*3/uL (ref 3.4–10.8)

## 2023-02-13 LAB — CMP14+EGFR
ALT: 25 [IU]/L (ref 0–32)
AST: 26 [IU]/L (ref 0–40)
Albumin: 3.8 g/dL (ref 3.6–4.6)
Alkaline Phosphatase: 109 [IU]/L (ref 44–121)
BUN/Creatinine Ratio: 27 (ref 12–28)
BUN: 21 mg/dL (ref 10–36)
Bilirubin Total: <0.2 mg/dL (ref 0.0–1.2)
CO2: 23 mmol/L (ref 20–29)
Calcium: 8.9 mg/dL (ref 8.7–10.3)
Chloride: 103 mmol/L (ref 96–106)
Creatinine, Ser: 0.78 mg/dL (ref 0.57–1.00)
Globulin, Total: 2.7 g/dL (ref 1.5–4.5)
Glucose: 109 mg/dL — ABNORMAL HIGH (ref 70–99)
Potassium: 4.4 mmol/L (ref 3.5–5.2)
Sodium: 139 mmol/L (ref 134–144)
Total Protein: 6.5 g/dL (ref 6.0–8.5)
eGFR: 70 mL/min/{1.73_m2} (ref >59)

## 2023-02-13 LAB — LIPID PANEL
Chol/HDL Ratio: 2.3 {ratio} (ref 0.0–4.4)
Cholesterol, Total: 127 mg/dL (ref 100–199)
HDL: 56 mg/dL (ref >39)
LDL Chol Calc (NIH): 55 mg/dL (ref 0–99)
Triglycerides: 80 mg/dL (ref 0–149)
VLDL Cholesterol Cal: 16 mg/dL (ref 5–40)

## 2023-02-16 ENCOUNTER — Other Ambulatory Visit: Payer: Self-pay | Admitting: Family Medicine

## 2023-02-16 DIAGNOSIS — I63512 Cerebral infarction due to unspecified occlusion or stenosis of left middle cerebral artery: Secondary | ICD-10-CM

## 2023-02-16 DIAGNOSIS — I10 Essential (primary) hypertension: Secondary | ICD-10-CM

## 2023-02-19 ENCOUNTER — Ambulatory Visit (INDEPENDENT_AMBULATORY_CARE_PROVIDER_SITE_OTHER): Payer: Medicare Other | Admitting: Family Medicine

## 2023-02-19 ENCOUNTER — Encounter: Payer: Self-pay | Admitting: Family Medicine

## 2023-02-19 VITALS — BP 122/60 | HR 64 | Temp 97.7°F | Ht 61.0 in | Wt 129.2 lb

## 2023-02-19 DIAGNOSIS — I63512 Cerebral infarction due to unspecified occlusion or stenosis of left middle cerebral artery: Secondary | ICD-10-CM | POA: Diagnosis not present

## 2023-02-19 DIAGNOSIS — I4811 Longstanding persistent atrial fibrillation: Secondary | ICD-10-CM | POA: Diagnosis not present

## 2023-02-19 DIAGNOSIS — Z7901 Long term (current) use of anticoagulants: Secondary | ICD-10-CM | POA: Diagnosis not present

## 2023-02-19 DIAGNOSIS — Z5181 Encounter for therapeutic drug level monitoring: Secondary | ICD-10-CM | POA: Diagnosis not present

## 2023-02-19 LAB — COAGUCHEK XS/INR WAIVED
INR: 1.9 — ABNORMAL HIGH (ref 0.9–1.1)
Prothrombin Time: 23.3 s

## 2023-02-19 LAB — POCT INR: INR: 1.9 — AB (ref 2.0–3.0)

## 2023-02-19 NOTE — Progress Notes (Signed)
 Subjective:  Patient ID: Dominique Farley, female    DOB: [birth year], 89 y.o.   MRN: [id number]  Patient Care Team: Jolinda Norene HERO, DO as PCP - General (Family Medicine) Alvan Dorn FALCON, MD as PCP - Cardiology (Cardiology) Porter Andrez SAUNDERS, PA-C as Physician Assistant (Dermatology) Livingston Rigg, MD (Inactive) as Consulting Physician (Dermatology)   Chief Complaint:  Coagulation Disorder   HPI: Dominique Farley is a 89 y.o. female presenting on 02/19/2023 for Coagulation Disorder   INR 1.2 last Friday. Dr. Jolinda had her to increase to 10 mg Friday night and then resume 5 mg daily. She denies any symptoms. She does have an ongoing wound to her left lateral forearm, family reports this was from a previous surgery and has been bleeding intermittently since then. She has been using mupirocin  cream with some relief of symptoms.    Relevant past medical, surgical, family, and social history reviewed and updated as indicated.  Allergies and medications reviewed and updated. Data reviewed: Chart in Epic.   Past Medical History:  Diagnosis Date   Acute cystitis with hematuria 11/22/2020   Atrial fibrillation (HCC)    BCC (basal cell carcinoma of skin) 11/14/2004   Right Mid Cheek(Cx3,5FU)   BCC (basal cell carcinoma of skin) 04/16/2011   Right Crease(tx p bx)   BCC (basal cell carcinoma of skin) 06/16/2016   Left Nare(tx p bx)   BCC (basal cell carcinoma of skin) Atypical Basaloid 09/29/2018   Left Side Nose(tx p bx)   Hypertension    Osteoporosis    Pneumonia of lower lobe due to infectious organism 11/20/2020   SCC (squamous cell carcinoma) 09/29/2018   Left Jawline(tx p bx)   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   Right Jawline(Cx3,5FU) and Left Upper Cheek(Cx3,5FU)   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   Left Jawline(tx p bx), Nose(tx p bx), Right Cheek(tx p bx), and Right Forearm(tx p bx)   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    Right Temple(Cx3,5FU) and Left Outer Eye(Cx3,5FU)   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   Right Forearm(Cx3,5FU)   Squamous cell carcinoma in situ (SCCIS) 03/13/2005   Left Side of Nose(Cx3,5FU)   Squamous cell carcinoma in situ (SCCIS) 06/04/2005   Inner Right Cheek/Eye(Tx p Bx)   Squamous cell carcinoma in situ (SCCIS) 02/12/2010   Right Cheek(tx p bx)   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 2 09/29/2018   Upper Lip(tx p bx) and Bridge of Nose(tx p bx)   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   Left Forehead, Left Neck, Right Nose, and Right Hand   Stroke (HCC)    Tremor    Weakness 11/20/2020    Past Surgical History:  Procedure Laterality Date   CHOLECYSTECTOMY     skin biopsies      Social History   Socioeconomic History   Marital status: Widowed    Spouse name: Not on file   Number of children: 3   Years of education: Not on file   Highest education level: Not on file  Occupational History   Occupation: Retired  Tobacco Use   Smoking status: Never   Smokeless tobacco: Never  Vaping Use   Vaping status: Never Used  Substance and Sexual Activity   Alcohol use: Never   Drug use: Never   Sexual activity: Not Currently  Other Topics Concern   Not on file  Social History Narrative  Not on file   Social Drivers of Health   Financial Resource Strain: Low Risk  (11/21/2020)   Received from [facility], [facility]   Overall Financial Resource Strain (CARDIA)    Difficulty of Paying Living Expenses: Not hard at all  Food Insecurity: No Food Insecurity (11/21/2020)   Received from [facility], [facility]   Hunger Vital Sign    Worried About Running Out of Food in the Last Year: Never true    Ran Out of Food in the Last Year: Never true  Transportation Needs: No Transportation Needs (11/21/2020)   Received from [facility], [facility]   [facility] - Transportation    Lack of Transportation (Medical): No     Lack of Transportation (Non-Medical): No  Physical Activity: Not on file  Stress: Not on file  Social Connections: Unknown (05/27/2021)   Received from [facility], [facility]   Social Network    Social Network: Not on file  Intimate Partner Violence: Unknown (04/18/2021)   Received from [facility], [facility]   HITS    Physically Hurt: Not on file    Insult or Talk Down To: Not on file    Threaten Physical Harm: Not on file    Scream or Curse: Not on file    Outpatient Encounter Medications as of 02/19/2023  Medication Sig   amLODipine  (NORVASC ) 5 MG tablet TAKE 1 AND 1/2 TABLETS BY MOUTH DAILY   aspirin  EC 81 MG tablet Take 81 mg by mouth 3 (three) times a week.   atorvastatin  (LIPITOR) 40 MG tablet TAKE 1 TABLET BY MOUTH AT BEDTIME   busPIRone  (BUSPAR ) 10 MG tablet Take 1 tablet (10 mg total) by mouth at bedtime as needed.   clobetasol  cream (TEMOVATE ) 0.05 % APPLY TO THE AFFECTED AREA(S) TWICE DAILY UNTIL PSORIATIC LESION RESOLVED   lisinopril  (ZESTRIL ) 20 MG tablet Take 1 tablet (20 mg total) by mouth daily.   polyethylene glycol (MIRALAX  / GLYCOLAX ) 17 g packet Take 17 g by mouth daily.   primidone  (MYSOLINE ) 50 MG tablet TAKE 1 AND 1/2 TABLETS BY MOUTH THREE TIMES DAILY   warfarin (COUMADIN ) 5 MG tablet TAKE 1 AND 1/2 TABLETS BY MOUTH EVERY DAY EXCEPT TAKE 2 TABLETS ON TUESDAY AND FRIDAY   No facility-administered encounter medications on file as of 02/19/2023.    Allergies  Allergen Reactions   Codeine Other (See Comments)   Influenza Vaccines    Remeron  [Mirtazapine ] Other (See Comments)    Dizziness    Sulfa Antibiotics Hives   ROS per HPI      Objective:  BP 122/60   Pulse 64   Temp 97.7 F (36.5 C)   Ht 5' 1 (1.549 m)   Wt 129 lb 3.2 oz (58.6 kg)   BMI 24.41 kg/m    Wt Readings from Last 3 Encounters:  02/19/23 129 lb 3.2 oz (58.6 kg)  02/12/23 129 lb (58.5 kg)  02/04/23 126 lb (57.2 kg)    Physical Exam Vitals and nursing note  reviewed.  Constitutional:      General: She is not in acute distress.    Appearance: She is ill-appearing (chronically ill, thin elderly). She is not toxic-appearing or diaphoretic.  HENT:     Head: Normocephalic and atraumatic.     Mouth/Throat:     Mouth: Mucous membranes are moist.  Eyes:     Conjunctiva/sclera: Conjunctivae normal.     Pupils: Pupils are equal, round, and  reactive to light.  Cardiovascular:     Rate and Rhythm: Normal rate. Rhythm irregularly irregular.  Pulmonary:     Effort: Pulmonary effort is normal.     Breath sounds: Normal breath sounds.  Musculoskeletal:     Cervical back: Neck supple.     Right lower leg: No edema.     Left lower leg: No edema.  Skin:    General: Skin is warm and dry.     Capillary Refill: Capillary refill takes less than 2 seconds.       Neurological:     Mental Status: She is alert. Mental status is at baseline.     Motor: Tremor present.     Gait: Gait abnormal (in wheelchair).  Psychiatric:        Mood and Affect: Mood normal.        Behavior: Behavior normal.     Results for orders placed or performed in visit on 02/19/23  POCT INR   Collection Time: 02/19/23  3:24 PM  Result Value Ref Range   INR 1.9 (A) 2.0 - 3.0       Pertinent labs & imaging results that were available during my care of the patient were reviewed by me and considered in my medical decision making.  Assessment & Plan:  Dominique Farley was seen today for coagulation disorder.  Diagnoses and all orders for this visit:  Anticoagulation goal of INR 2 to 3 -     CoaguChek XS/INR Waived -     POCT INR  Longstanding persistent atrial fibrillation (HCC) -     CoaguChek XS/INR Waived -     POCT INR  Cerebrovascular accident (CVA) due to occlusion of left middle cerebral artery (HCC) -     CoaguChek XS/INR Waived -     POCT INR    INR subtherapeutic at 1.9 today. Uncertain etiology as she has not had any medication changes, changes in diet. I have  recommend resuming 1.5 tablets 2 days/week and continuing 1 tablet daily all others and following up in one week for repeat INR.  Continue all other maintenance medications.  Follow up plan: Return in about 1 week (around 02/26/2023) for INR.   Continue healthy lifestyle choices, including diet (rich in fruits, vegetables, and lean proteins, and low in salt and simple carbohydrates) and exercise (at least 30 minutes of moderate physical activity daily).  Educational handout given for anticoag calendar   The above assessment and management plan was discussed with the patient. The patient verbalized understanding of and has agreed to the management plan. Patient is aware to call the clinic if they develop any new symptoms or if symptoms persist or worsen. Patient is aware when to return to the clinic for a follow-up visit. Patient educated on when it is appropriate to go to the emergency department.   Dominique Bruns, FNP-C [facility] [phone]

## 2023-02-25 DIAGNOSIS — I739 Peripheral vascular disease, unspecified: Secondary | ICD-10-CM | POA: Diagnosis not present

## 2023-02-25 DIAGNOSIS — M79672 Pain in left foot: Secondary | ICD-10-CM | POA: Diagnosis not present

## 2023-02-25 DIAGNOSIS — L110 Acquired keratosis follicularis: Secondary | ICD-10-CM | POA: Diagnosis not present

## 2023-02-25 DIAGNOSIS — M79671 Pain in right foot: Secondary | ICD-10-CM | POA: Diagnosis not present

## 2023-02-26 ENCOUNTER — Ambulatory Visit (INDEPENDENT_AMBULATORY_CARE_PROVIDER_SITE_OTHER): Payer: Medicare Other | Admitting: Family Medicine

## 2023-02-26 ENCOUNTER — Ambulatory Visit: Payer: Medicare Other | Admitting: Family Medicine

## 2023-02-26 ENCOUNTER — Encounter: Payer: Self-pay | Admitting: Family Medicine

## 2023-02-26 VITALS — BP 110/51 | HR 104 | Temp 97.7°F | Ht 61.0 in | Wt 131.0 lb

## 2023-02-26 DIAGNOSIS — Z7901 Long term (current) use of anticoagulants: Secondary | ICD-10-CM

## 2023-02-26 DIAGNOSIS — Z5181 Encounter for therapeutic drug level monitoring: Secondary | ICD-10-CM | POA: Diagnosis not present

## 2023-02-26 DIAGNOSIS — I4811 Longstanding persistent atrial fibrillation: Secondary | ICD-10-CM | POA: Diagnosis not present

## 2023-02-26 NOTE — Progress Notes (Signed)
Subjective: CC: Follow-up chronic anticoagulation PCP: [name], DO JWJ:XBJY Chismar is a 89 y.o. female presenting to clinic today for:  1.  Chronic anticoagulation for atrial fibrillation Goal INR 2-3 Currently getting 1.5 tablets 2 days/week and 1 tablet daily all others.  She was slightly subtherapeutic at last visit 1 week ago and advised to continue current regimen for now.  She reports no bleeding episodes.  She is accompanied today by her caregiver and daughter.   ROS: Per HPI  Allergies  Allergen Reactions   Codeine Other (See Comments)   Influenza Vaccines    Remeron [Mirtazapine] Other (See Comments)    Dizziness    Sulfa Antibiotics Hives   Past Medical History:  Diagnosis Date   Acute cystitis with hematuria 11/22/2020   Atrial fibrillation (HCC)    BCC (basal cell carcinoma of skin) 11/14/2004   Right Mid Cheek(Cx3,5FU)   BCC (basal cell carcinoma of skin) 04/16/2011   Right Crease(tx p bx)   BCC (basal cell carcinoma of skin) 06/16/2016   Left Nare(tx p bx)   BCC (basal cell carcinoma of skin) Atypical Basaloid 09/29/2018   Left Side Nose(tx p bx)   Hypertension    Osteoporosis    Pneumonia of lower lobe due to infectious organism 11/20/2020   SCC (squamous cell carcinoma) 09/29/2018   Left Jawline(tx p bx)   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   Right Jawline(Cx3,5FU) and Left Upper Cheek(Cx3,5FU)   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   Left Jawline(tx p bx), Nose(tx p bx), Right Cheek(tx p bx), and Right Forearm(tx p bx)   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   Right Temple(Cx3,5FU) and Left Outer Eye(Cx3,5FU)   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   Right Forearm(Cx3,5FU)   Squamous cell carcinoma in situ (SCCIS) 03/13/2005   Left Side of Nose(Cx3,5FU)   Squamous cell carcinoma in situ (SCCIS) 06/04/2005   Inner Right Cheek/Eye(Tx p Bx)   Squamous cell carcinoma in situ (SCCIS) 02/12/2010    Right Cheek(tx p bx)   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 2 09/29/2018   Upper Lip(tx p bx) and Bridge of Nose(tx p bx)   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   Left Forehead, Left Neck, Right Nose, and Right Hand   Stroke (HCC)    Tremor    Weakness 11/20/2020    Current Outpatient Medications:    amLODipine (NORVASC) 5 MG tablet, TAKE 1 AND 1/2 TABLETS BY MOUTH DAILY, Disp: 135 tablet, Rfl: 0   aspirin EC 81 MG tablet, Take 81 mg by mouth 3 (three) times a week., Disp: , Rfl:    atorvastatin (LIPITOR) 40 MG tablet, TAKE 1 TABLET BY MOUTH AT BEDTIME, Disp: 90 tablet, Rfl: 0   busPIRone (BUSPAR) 10 MG tablet, Take 1 tablet (10 mg total) by mouth at bedtime as needed., Disp: 180 tablet, Rfl: 0   clobetasol cream (TEMOVATE) 0.05 %, APPLY TO THE AFFECTED AREA(S) TWICE DAILY UNTIL PSORIATIC LESION RESOLVED, Disp: 120 g, Rfl: 2   lisinopril (ZESTRIL) 20 MG tablet, Take 1 tablet (20 mg total) by mouth daily., Disp: 90 tablet, Rfl: 1   polyethylene glycol (MIRALAX / GLYCOLAX) 17 g packet, Take 17 g by mouth daily., Disp: , Rfl:    primidone (MYSOLINE) 50 MG tablet, TAKE 1 AND 1/2 TABLETS BY MOUTH THREE TIMES DAILY, Disp: 405 tablet, Rfl: 0   warfarin (COUMADIN) 5 MG tablet, TAKE 1  AND 1/2 TABLETS BY MOUTH EVERY DAY EXCEPT TAKE 2 TABLETS ON TUESDAY AND FRIDAY, Disp: 135 tablet, Rfl: 0 Social History   Socioeconomic History   Marital status: Widowed    Spouse name: Not on file   Number of children: 3   Years of education: Not on file   Highest education level: Not on file  Occupational History   Occupation: Retired  Tobacco Use   Smoking status: Never   Smokeless tobacco: Never  Vaping Use   Vaping status: Never Used  Substance and Sexual Activity   Alcohol use: Never   Drug use: Never   Sexual activity: Not Currently  Other Topics Concern   Not on file  Social History Narrative   Not on file   Social Drivers of Health   Financial Resource Strain: Low  Risk  (11/21/2020)   Received from [facility], [facility]   Overall Financial Resource Strain (CARDIA)    Difficulty of Paying Living Expenses: Not hard at all  Food Insecurity: No Food Insecurity (11/21/2020)   Received from [facility], [facility]   Hunger Vital Sign    Worried About Running Out of Food in the Last Year: Never true    Ran Out of Food in the Last Year: Never true  Transportation Needs: No Transportation Needs (11/21/2020)   Received from [facility], [facility]   [facility] - Transportation    Lack of Transportation (Medical): No    Lack of Transportation (Non-Medical): No  Physical Activity: Not on file  Stress: Not on file  Social Connections: Unknown (05/27/2021)   Received from [facility], [facility]   Social Network    Social Network: Not on file  Intimate Partner Violence: Unknown (04/18/2021)   Received from [facility], [facility]   HITS    Physically Hurt: Not on file    Insult or Talk Down To: Not on file    Threaten Physical Harm: Not on file    Scream or Curse: Not on file   Family History  Problem Relation Age of Onset   Dementia Mother    Stroke Father    Heart disease Father    Diabetes Sister    Stroke Son     Objective: Office vital signs reviewed. BP (!) 110/51   Pulse (!) 104   Temp 97.7 F (36.5 C) (Temporal)   Ht 5\' 1"  (1.549 m)   Wt 131 lb (59.4 kg)   BMI 24.75 kg/m   Physical Examination:  General: Awake, alert, well nourished, No acute distress HEENT: sclera white, MMM Cardio: Irregularly irregular with rate control, S1S2 heard, no murmurs appreciated Pulm: clear to auscultation bilaterally, no wheezes, rhonchi or rales; normal work of breathing on room air    Assessment/ Plan: 89 y.o. female   Longstanding persistent atrial fibrillation (HCC) - Plan: CoaguChek XS/INR Waived  Anticoagulation goal of INR 2 to 3 - Plan: CoaguChek XS/INR Waived  INR subtherapeutic at 1.7 today.   Advance to 1.5 tablets 3 days/week and continue 1 tablet daily all others.  2-week follow-up scheduled.  She is aware of date and time   [name], DO [facility] [phone]

## 2023-03-01 LAB — COAGUCHEK XS/INR WAIVED
INR: 1.7 — ABNORMAL HIGH (ref 0.9–1.1)
Prothrombin Time: 20.5 s

## 2023-03-09 ENCOUNTER — Encounter: Payer: Self-pay | Admitting: *Deleted

## 2023-03-10 ENCOUNTER — Ambulatory Visit (INDEPENDENT_AMBULATORY_CARE_PROVIDER_SITE_OTHER): Payer: Medicare Other | Admitting: Family Medicine

## 2023-03-10 ENCOUNTER — Encounter: Payer: Self-pay | Admitting: Family Medicine

## 2023-03-10 VITALS — BP 138/82 | HR 82 | Temp 98.7°F | Ht 61.0 in | Wt 130.0 lb

## 2023-03-10 DIAGNOSIS — Z7901 Long term (current) use of anticoagulants: Secondary | ICD-10-CM

## 2023-03-10 DIAGNOSIS — R54 Age-related physical debility: Secondary | ICD-10-CM

## 2023-03-10 DIAGNOSIS — Z5181 Encounter for therapeutic drug level monitoring: Secondary | ICD-10-CM | POA: Diagnosis not present

## 2023-03-10 DIAGNOSIS — I4811 Longstanding persistent atrial fibrillation: Secondary | ICD-10-CM | POA: Diagnosis not present

## 2023-03-10 LAB — COAGUCHEK XS/INR WAIVED
INR: 1.7 — ABNORMAL HIGH (ref 0.9–1.1)
Prothrombin Time: 20.7 s

## 2023-03-10 NOTE — Patient Instructions (Signed)
 INR 1.7 today. Too thick. Starting TODAY:  Take 1.5 tablets daily EXCEPT take 1 tablet on Monday and Thursdays.

## 2023-03-10 NOTE — Progress Notes (Signed)
 Subjective: CC: Follow-up subtherapeutic INR PCP: [name], DO UYQ:IHKV Christenson is a 89 y.o. female presenting to clinic today for:  1.  Subtherapeutic INR Patient treated for chronic atrial fibrillation with INR goal of 2-3 She is been taking 7.5 mg of Coumadin 3 days/week and 5 mg daily all other days.  No bleeding.  No leg swelling, chest pain or shortness of breath.  She is brought to the office by her home health nurse and daughter   ROS: Per HPI  Allergies  Allergen Reactions   Codeine Other (See Comments)   Influenza Vaccines    Remeron [Mirtazapine] Other (See Comments)    Dizziness    Sulfa Antibiotics Hives   Past Medical History:  Diagnosis Date   Acute cystitis with hematuria 11/22/2020   Atrial fibrillation (HCC)    BCC (basal cell carcinoma of skin) 11/14/2004   Right Mid Cheek(Cx3,5FU)   BCC (basal cell carcinoma of skin) 04/16/2011   Right Crease(tx p bx)   BCC (basal cell carcinoma of skin) 06/16/2016   Left Nare(tx p bx)   BCC (basal cell carcinoma of skin) Atypical Basaloid 09/29/2018   Left Side Nose(tx p bx)   Cerebrovascular accident (CVA) due to occlusion of left middle cerebral artery (HCC) 10/18/2018   Hypertension    Osteoporosis    Pneumonia of lower lobe due to infectious organism 11/20/2020   SCC (squamous cell carcinoma) 09/29/2018   Left Jawline(tx p bx)   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   Right Jawline(Cx3,5FU) and Left Upper Cheek(Cx3,5FU)   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   Left Jawline(tx p bx), Nose(tx p bx), Right Cheek(tx p bx), and Right Forearm(tx p bx)   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   Right Temple(Cx3,5FU) and Left Outer Eye(Cx3,5FU)   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   Right Forearm(Cx3,5FU)   Squamous cell carcinoma in situ (SCCIS) 03/13/2005   Left Side of Nose(Cx3,5FU)   Squamous cell carcinoma in situ (SCCIS) 06/04/2005   Inner Right  Cheek/Eye(Tx p Bx)   Squamous cell carcinoma in situ (SCCIS) 02/12/2010   Right Cheek(tx p bx)   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 2 09/29/2018   Upper Lip(tx p bx) and Bridge of Nose(tx p bx)   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   Left Forehead, Left Neck, Right Nose, and Right Hand   Stroke (HCC)    Tremor    Weakness 11/20/2020    Current Outpatient Medications:    amLODipine (NORVASC) 5 MG tablet, TAKE 1 AND 1/2 TABLETS BY MOUTH DAILY, Disp: 135 tablet, Rfl: 0   aspirin EC 81 MG tablet, Take 81 mg by mouth 3 (three) times a week., Disp: , Rfl:    atorvastatin (LIPITOR) 40 MG tablet, TAKE 1 TABLET BY MOUTH AT BEDTIME, Disp: 90 tablet, Rfl: 0   busPIRone (BUSPAR) 10 MG tablet, Take 1 tablet (10 mg total) by mouth at bedtime as needed., Disp: 180 tablet, Rfl: 0   clobetasol cream (TEMOVATE) 0.05 %, APPLY TO THE AFFECTED AREA(S) TWICE DAILY UNTIL PSORIATIC LESION RESOLVED, Disp: 120 g, Rfl: 2   lisinopril (ZESTRIL) 20 MG tablet, Take 1 tablet (20 mg total) by mouth daily., Disp: 90 tablet, Rfl: 1   polyethylene glycol (MIRALAX / GLYCOLAX) 17 g packet, Take 17 g by mouth daily., Disp: , Rfl:    primidone (MYSOLINE) 50 MG tablet, TAKE 1 AND 1/2 TABLETS BY MOUTH  THREE TIMES DAILY, Disp: 405 tablet, Rfl: 0   warfarin (COUMADIN) 5 MG tablet, TAKE 1 AND 1/2 TABLETS BY MOUTH EVERY DAY EXCEPT TAKE 2 TABLETS ON TUESDAY AND FRIDAY, Disp: 135 tablet, Rfl: 0 Social History   Socioeconomic History   Marital status: Widowed    Spouse name: Not on file   Number of children: 3   Years of education: Not on file   Highest education level: Not on file  Occupational History   Occupation: Retired  Tobacco Use   Smoking status: Never   Smokeless tobacco: Never  Vaping Use   Vaping status: Never Used  Substance and Sexual Activity   Alcohol use: Never   Drug use: Never   Sexual activity: Not Currently  Other Topics Concern   Not on file  Social History Narrative   Not  on file   Social Drivers of Health   Financial Resource Strain: Low Risk  (11/21/2020)   Received [facility], [facility]   Overall Financial Resource Strain (CARDIA)    Difficulty of Paying Living Expenses: Not hard at all  Food Insecurity: No Food Insecurity (11/21/2020)   Received from [facility], [facility]   Hunger Vital Sign    Worried About Running Out of Food in the Last Year: Never true    Ran Out of Food in the Last Year: Never true  Transportation Needs: No Transportation Needs (11/21/2020)   Received from [facility], [facility]   [facility] - Transportation    Lack of Transportation (Medical): No    Lack of Transportation (Non-Medical): No  Physical Activity: Not on file  Stress: Not on file  Social Connections: Unknown (05/27/2021)   Received from [facility], [facility]   Social Network    Social Network: Not on file  Intimate Partner Violence: Unknown (04/18/2021)   Received from [facility], [facility]   HITS    Physically Hurt: Not on file    Insult or Talk Down To: Not on file    Threaten Physical Harm: Not on file    Scream or Curse: Not on file   Family History  Problem Relation Age of Onset   Dementia Mother    Stroke Father    Heart disease Father    Diabetes Sister    Stroke Son     Objective: Office vital signs reviewed. BP 138/82   Pulse 82   Temp 98.7 F (37.1 C)   Ht 5\' 1"  (1.549 m)   Wt 130 lb (59 kg)   SpO2 97%   BMI 24.56 kg/m   Physical Examination:  General: Awake, alert, well nourished, No acute distress HEENT: MMM, resting jaw tremor present Cardio: Irregularly irregular with rate controlled.  S1S2 heard, no murmurs appreciated Pulm: clear to auscultation bilaterally, no wheezes, rhonchi or rales; normal work of breathing on room air Extremities: No gross edema, no appreciable skin tears today.  She has postinflammatory hyperpigmentation present on bilateral shins  Assessment/ Plan: 89  y.o. female   Longstanding persistent atrial fibrillation (HCC) - Plan: CoaguChek XS/INR Waived  Anticoagulation goal of INR 2 to 3  Chronic anticoagulation  Frailty syndrome in geriatric patient  INR subtherapeutic at 1.7.  Increase to 7.5 mg 5 days/week and continue 5 mg 2 days/week.  2-week follow-up scheduled   [name], DO [facility] [phone]

## 2023-03-12 ENCOUNTER — Ambulatory Visit: Payer: Medicare Other | Admitting: Family Medicine

## 2023-03-24 ENCOUNTER — Ambulatory Visit: Payer: Medicare Other | Admitting: Family Medicine

## 2023-03-24 ENCOUNTER — Encounter: Payer: Self-pay | Admitting: Family Medicine

## 2023-03-24 VITALS — BP 142/86 | HR 63 | Temp 98.4°F | Ht 61.0 in | Wt 131.0 lb

## 2023-03-24 DIAGNOSIS — I4811 Longstanding persistent atrial fibrillation: Secondary | ICD-10-CM | POA: Diagnosis not present

## 2023-03-24 DIAGNOSIS — Z7901 Long term (current) use of anticoagulants: Secondary | ICD-10-CM

## 2023-03-24 DIAGNOSIS — D692 Other nonthrombocytopenic purpura: Secondary | ICD-10-CM | POA: Diagnosis not present

## 2023-03-24 DIAGNOSIS — D049 Carcinoma in situ of skin, unspecified: Secondary | ICD-10-CM

## 2023-03-24 DIAGNOSIS — Z5181 Encounter for therapeutic drug level monitoring: Secondary | ICD-10-CM | POA: Diagnosis not present

## 2023-03-24 LAB — COAGUCHEK XS/INR WAIVED
INR: 1.9 — ABNORMAL HIGH (ref 0.9–1.1)
Prothrombin Time: 23 s

## 2023-03-24 MED ORDER — WARFARIN SODIUM 5 MG PO TABS: ORAL_TABLET | ORAL | 3 refills | Status: AC

## 2023-03-24 NOTE — Progress Notes (Signed)
 Subjective: CC:INR check PCP: [name], DO Dominique Farley is a 89 y.o. female presenting to clinic today for:  1. Atrial fibrillation Goal INR 2-3 Patient had subtherapeutic INR at 1.7 last visit.  Her daughter was advised to increase her dosing of Coumadin to 7.5 mg daily except for 5 mg 2 days/week.  She is here for interval follow-up.  No missed doses.  No bleeding.  ROS: Per HPI  Allergies  Allergen Reactions   Codeine Other (See Comments)   Influenza Vaccines    Remeron [Mirtazapine] Other (See Comments)    Dizziness    Sulfa Antibiotics Hives   Past Medical History:  Diagnosis Date   Acute cystitis with hematuria 11/22/2020   Atrial fibrillation (HCC)    BCC (basal cell carcinoma of skin) 11/14/2004   Right Mid Cheek(Cx3,5FU)   BCC (basal cell carcinoma of skin) 04/16/2011   Right Crease(tx p bx)   BCC (basal cell carcinoma of skin) 06/16/2016   Left Nare(tx p bx)   BCC (basal cell carcinoma of skin) Atypical Basaloid 09/29/2018   Left Side Nose(tx p bx)   Cerebrovascular accident (CVA) due to occlusion of left middle cerebral artery (HCC) 10/18/2018   Hypertension    Osteoporosis    Pneumonia of lower lobe due to infectious organism 11/20/2020   SCC (squamous cell carcinoma) 09/29/2018   Left Jawline(tx p bx)   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   Right Jawline(Cx3,5FU) and Left Upper Cheek(Cx3,5FU)   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   Left Jawline(tx p bx), Nose(tx p bx), Right Cheek(tx p bx), and Right Forearm(tx p bx)   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   Right Temple(Cx3,5FU) and Left Outer Eye(Cx3,5FU)   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   Right Forearm(Cx3,5FU)   Squamous cell carcinoma in situ (SCCIS) 03/13/2005   Left Side of Nose(Cx3,5FU)   Squamous cell carcinoma in situ (SCCIS) 06/04/2005   Inner Right Cheek/Eye(Tx p Bx)   Squamous cell carcinoma in situ (SCCIS) 02/12/2010    Right Cheek(tx p bx)   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 2 09/29/2018   Upper Lip(tx p bx) and Bridge of Nose(tx p bx)   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   Left Forehead, Left Neck, Right Nose, and Right Hand   Stroke (HCC)    Tremor    Weakness 11/20/2020    Current Outpatient Medications:    amLODipine (NORVASC) 5 MG tablet, TAKE 1 AND 1/2 TABLETS BY MOUTH DAILY, Disp: 135 tablet, Rfl: 0   aspirin EC 81 MG tablet, Take 81 mg by mouth 3 (three) times a week., Disp: , Rfl:    atorvastatin (LIPITOR) 40 MG tablet, TAKE 1 TABLET BY MOUTH AT BEDTIME, Disp: 90 tablet, Rfl: 0   busPIRone (BUSPAR) 10 MG tablet, Take 1 tablet (10 mg total) by mouth at bedtime as needed., Disp: 180 tablet, Rfl: 0   clobetasol cream (TEMOVATE) 0.05 %, APPLY TO THE AFFECTED AREA(S) TWICE DAILY UNTIL PSORIATIC LESION RESOLVED, Disp: 120 g, Rfl: 2   lisinopril (ZESTRIL) 20 MG tablet, Take 1 tablet (20 mg total) by mouth daily., Disp: 90 tablet, Rfl: 1   polyethylene glycol (MIRALAX / GLYCOLAX) 17 g packet, Take 17 g by mouth daily., Disp: , Rfl:    primidone (MYSOLINE) 50 MG tablet, TAKE 1 AND 1/2 TABLETS BY MOUTH THREE TIMES DAILY, Disp: 405 tablet, Rfl: 0   warfarin (COUMADIN) 5  MG tablet, TAKE 1 AND 1/2 TABLETS BY MOUTH EVERY DAY EXCEPT TAKE 2 TABLETS ON TUESDAY AND FRIDAY, Disp: 135 tablet, Rfl: 0 Social History   Socioeconomic History   Marital status: Widowed    Spouse name: Not on file   Number of children: 3   Years of education: Not on file   Highest education level: Not on file  Occupational History   Occupation: Retired  Tobacco Use   Smoking status: Never   Smokeless tobacco: Never  Vaping Use   Vaping status: Never Used  Substance and Sexual Activity   Alcohol use: Never   Drug use: Never   Sexual activity: Not Currently  Other Topics Concern   Not on file  Social History Narrative   Not on file   Social Drivers of Health   Financial Resource Strain: Low  Risk  (11/21/2020)   [facility], [facility]   Overall Financial Resource Strain (CARDIA)    Difficulty of Paying Living Expenses: Not hard at all  Food Insecurity: No Food Insecurity (11/21/2020)   Received from [facility], [facility]   Hunger Vital Sign    Worried About Running Out of Food in the Last Year: Never true    Ran Out of Food in the Last Year: Never true  Transportation Needs: No Transportation Needs (11/21/2020)   Received from [facility], [facility]   [facility] - Transportation    Lack of Transportation (Medical): No    Lack of Transportation (Non-Medical): No  Physical Activity: Not on file  Stress: Not on file  Social Connections: Unknown (05/27/2021)   Received from [facility], [facility]   Social Network    Social Network: Not on file  Intimate Partner Violence: Unknown (04/18/2021)   Received from [facility], [facility]   HITS    Physically Hurt: Not on file    Insult or Talk Down To: Not on file    Threaten Physical Harm: Not on file    Scream or Curse: Not on file   Family History  Problem Relation Age of Onset   Dementia Mother    Stroke Father    Heart disease Father    Diabetes Sister    Stroke Son     Objective: Office vital signs reviewed. BP (!) 142/86   Pulse 63   Temp 98.4 F (36.9 C)   Ht 5\' 1"  (1.549 m)   Wt 131 lb (59.4 kg)   SpO2 98%   BMI 24.75 kg/m   Physical Examination:  General: Awake, alert, elderly, nontoxic-appearing female, No acute distress HEENT: sclera white, MMM Cardio: irregularly irregular. Pulm: clear to auscultation bilaterally, no wheezes, rhonchi or rales; normal work of breathing on room air Skin: Large, coliform lesion on the left forearm present concerning for squamous cell carcinoma       Assessment/ Plan: 89 y.o. female   Longstanding persistent atrial fibrillation (HCC) - Plan: CoaguChek XS/INR Waived, warfarin (COUMADIN) 5 MG  tablet  Anticoagulation goal of INR 2 to 3 - Plan: CoaguChek XS/INR Waived, warfarin (COUMADIN) 5 MG tablet  Purpura senilis (HCC)  Squamous cell carcinoma in situ (SCCIS) of skin - Plan: Ambulatory referral to Dermatology  INR still remaining slightly subtherapeutic at 1.9 so are going to advance her to 7.5 mg daily except for 1 day/week 5 mg.    Purpura due to anticoagulation for the above.  Referral placed to [facility] in [place], not sure she  still at that office.  Will place referral to Dr. Margo Aye if she is not available.  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  The lesion on the left forearm that was concerning for squamous cell carcinoma  May follow-up in 4 weeks, sooner if concerns arise   [name], DO [facility] [phone]

## 2023-04-05 DIAGNOSIS — Z85828 Personal history of other malignant neoplasm of skin: Secondary | ICD-10-CM | POA: Diagnosis not present

## 2023-04-05 DIAGNOSIS — C44619 Basal cell carcinoma of skin of left upper limb, including shoulder: Secondary | ICD-10-CM | POA: Diagnosis not present

## 2023-04-05 DIAGNOSIS — D485 Neoplasm of uncertain behavior of skin: Secondary | ICD-10-CM | POA: Diagnosis not present

## 2023-04-05 DIAGNOSIS — L400 Psoriasis vulgaris: Secondary | ICD-10-CM | POA: Diagnosis not present

## 2023-04-05 DIAGNOSIS — Z08 Encounter for follow-up examination after completed treatment for malignant neoplasm: Secondary | ICD-10-CM | POA: Diagnosis not present

## 2023-04-15 ENCOUNTER — Encounter: Payer: Self-pay | Admitting: Family Medicine

## 2023-04-18 ENCOUNTER — Other Ambulatory Visit: Payer: Self-pay | Admitting: Family Medicine

## 2023-04-18 DIAGNOSIS — G250 Essential tremor: Secondary | ICD-10-CM

## 2023-04-26 ENCOUNTER — Ambulatory Visit (INDEPENDENT_AMBULATORY_CARE_PROVIDER_SITE_OTHER): Admitting: Family Medicine

## 2023-04-26 ENCOUNTER — Encounter: Payer: Self-pay | Admitting: Family Medicine

## 2023-04-26 VITALS — BP 124/78 | HR 70 | Temp 98.3°F | Ht 61.0 in | Wt 132.0 lb

## 2023-04-26 DIAGNOSIS — Z7901 Long term (current) use of anticoagulants: Secondary | ICD-10-CM

## 2023-04-26 DIAGNOSIS — I4811 Longstanding persistent atrial fibrillation: Secondary | ICD-10-CM | POA: Diagnosis not present

## 2023-04-26 DIAGNOSIS — C44619 Basal cell carcinoma of skin of left upper limb, including shoulder: Secondary | ICD-10-CM | POA: Diagnosis not present

## 2023-04-26 DIAGNOSIS — Z5181 Encounter for therapeutic drug level monitoring: Secondary | ICD-10-CM

## 2023-04-26 LAB — COAGUCHEK XS/INR WAIVED
INR: 2.8 — ABNORMAL HIGH (ref 0.9–1.1)
Prothrombin Time: 33.6 s

## 2023-04-26 NOTE — Progress Notes (Signed)
 Subjective: CC: INR recheck PCP: [name], DO EAV:WUJW Medici is a 89 y.o. female presenting to clinic today for:  1.  Atrial fibrillation/ BCC Goal INR 2-3 Patient has been compliant with all medications as prescribed.  These are managed by her daughters.  Her daughter brings her to the office along with her one of her caregivers today.  She reports no bleeding episodes.  The lesion on her left forearm turned out to be basal cell carcinoma and they are trying to decide between radiation therapy and Mohs surgery at this point.  She like to discuss this further today   ROS: Per HPI  Allergies  Allergen Reactions   Codeine Other (See Comments)   Influenza Vaccines    Remeron [Mirtazapine] Other (See Comments)    Dizziness    Sulfa Antibiotics Hives   Past Medical History:  Diagnosis Date   Acute cystitis with hematuria 11/22/2020   Atrial fibrillation (HCC)    BCC (basal cell carcinoma of skin) 11/14/2004   Right Mid Cheek(Cx3,5FU)   BCC (basal cell carcinoma of skin) 04/16/2011   Right Crease(tx p bx)   BCC (basal cell carcinoma of skin) 06/16/2016   Left Nare(tx p bx)   BCC (basal cell carcinoma of skin) Atypical Basaloid 09/29/2018   Left Side Nose(tx p bx)   Cerebrovascular accident (CVA) due to occlusion of left middle cerebral artery (HCC) 10/18/2018   Hypertension    Osteoporosis    Pneumonia of lower lobe due to infectious organism 11/20/2020   SCC (squamous cell carcinoma) 09/29/2018   Left Jawline(tx p bx)   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   Right Jawline(Cx3,5FU) and Left Upper Cheek(Cx3,5FU)   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   Left Jawline(tx p bx), Nose(tx p bx), Right Cheek(tx p bx), and Right Forearm(tx p bx)   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   Right Temple(Cx3,5FU) and Left Outer Eye(Cx3,5FU)   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   Right Forearm(Cx3,5FU)   Squamous cell carcinoma  in situ (SCCIS) 03/13/2005   Left Side of Nose(Cx3,5FU)   Squamous cell carcinoma in situ (SCCIS) 06/04/2005   Inner Right Cheek/Eye(Tx p Bx)   Squamous cell carcinoma in situ (SCCIS) 02/12/2010   Right Cheek(tx p bx)   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 2 09/29/2018   Upper Lip(tx p bx) and Bridge of Nose(tx p bx)   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   Left Forehead, Left Neck, Right Nose, and Right Hand   Stroke (HCC)    Tremor    Weakness 11/20/2020    Current Outpatient Medications:    amLODipine (NORVASC) 5 MG tablet, TAKE 1 AND 1/2 TABLETS BY MOUTH DAILY, Disp: 135 tablet, Rfl: 0   aspirin EC 81 MG tablet, Take 81 mg by mouth 3 (three) times a week., Disp: , Rfl:    atorvastatin (LIPITOR) 40 MG tablet, TAKE 1 TABLET BY MOUTH AT BEDTIME, Disp: 90 tablet, Rfl: 0   busPIRone (BUSPAR) 10 MG tablet, Take 1 tablet (10 mg total) by mouth at bedtime as needed., Disp: 180 tablet, Rfl: 0   clobetasol cream (TEMOVATE) 0.05 %, APPLY TO THE AFFECTED AREA(S) TWICE DAILY UNTIL PSORIATIC LESION RESOLVED, Disp: 120 g, Rfl: 2   lisinopril (ZESTRIL) 20 MG tablet, Take 1 tablet (20 mg total) by mouth daily., Disp: 90 tablet, Rfl: 1   polyethylene glycol (MIRALAX / GLYCOLAX) 17 g packet, Take  17 g by mouth daily., Disp: , Rfl:    primidone (MYSOLINE) 50 MG tablet, TAKE 1 AND 1/2 TABLETS BY MOUTH THREE TIMES DAILY, Disp: 405 tablet, Rfl: 0   warfarin (COUMADIN) 5 MG tablet, TAKE 1 AND 1/2 TABLETS BY MOUTH EVERY DAY EXCEPT TAKE 1 TABLETS ON TUESDAY, Disp: 135 tablet, Rfl: 3 Social History   Socioeconomic History   Marital status: Widowed    Spouse name: Not on file   Number of children: 3   Years of education: Not on file   Highest education level: Not on file  Occupational History   Occupation: Retired  Tobacco Use   Smoking status: Never   Smokeless tobacco: Never  Vaping Use   Vaping status: Never Used  Substance and Sexual Activity   Alcohol use: Never   Drug  use: Never   Sexual activity: Not Currently  Other Topics Concern   Not on file  Social History Narrative   Not on file   Social Drivers of Health   Financial Resource Strain: Low Risk  (11/21/2020)   Received from [facility], [facility]   Overall Financial Resource Strain (CARDIA)    Difficulty of Paying Living Expenses: Not hard at all  Food Insecurity: No Food Insecurity (11/21/2020)   Received [facility], [facility]   Hunger Vital Sign    Worried About Running Out of Food in the Last Year: Never true    Ran Out of Food in the Last Year: Never true  Transportation Needs: No Transportation Needs (11/21/2020)   Received from [facility], [facility]   [facility] - Transportation    Lack of Transportation (Medical): No    Lack of Transportation (Non-Medical): No  Physical Activity: Not on file  Stress: Not on file  Social Connections: Unknown (05/27/2021)   Received from [facility], [facility]   Social Network    Social Network: Not on file  Intimate Partner Violence: Unknown (04/18/2021)   Received from [facility], [facility]   HITS    Physically Hurt: Not on file    Insult or Talk Down To: Not on file    Threaten Physical Harm: Not on file    Scream or Curse: Not on file   Family History  Problem Relation Age of Onset   Dementia Mother    Stroke Father    Heart disease Father    Diabetes Sister    Stroke Son     Objective: Office vital signs reviewed. BP 124/78   Pulse 70   Temp 98.3 F (36.8 C)   Ht 5\' 1"  (1.549 m)   Wt 132 lb (59.9 kg)   SpO2 95%   BMI 24.94 kg/m   Physical Examination:  General: Awake, alert, well-appearing elderly female, No acute distress HEENT sclera white.  Multiple hyperkeratotic lesions along the face Cardio: Irregularly irregular with rate control Pulm: clear to auscultation bilaterally, no wheezes, rhonchi or rales; normal work of breathing on room air Skin: Lesion on left forearm is  covered  Assessment/ Plan: 89 y.o. female   Longstanding persistent atrial fibrillation (HCC) - Plan: CoaguChek XS/INR Waived  Anticoagulation goal of INR 2 to 3 - Plan: CoaguChek XS/INR Waived  Basal cell carcinoma (BCC) of skin of left upper extremity including shoulder  INR therapeutic today at 2.8.  No changes.  8-week follow-up scheduled.  We discussed options should they decide to proceed with Mohs surgery.  I am glad to bridge  her with Lovenox should she decide.  I wonder if perhaps there may be some type of patient assistance for the hide cost of what the radiation treatment would be.  She is going to inquire with her specialist   Eliodoro Guerin, DO [facility] [phone]

## 2023-04-27 ENCOUNTER — Encounter: Payer: Self-pay | Admitting: Family Medicine

## 2023-04-27 DIAGNOSIS — C44619 Basal cell carcinoma of skin of left upper limb, including shoulder: Secondary | ICD-10-CM

## 2023-04-28 ENCOUNTER — Encounter: Payer: Self-pay | Admitting: Family Medicine

## 2023-05-17 ENCOUNTER — Other Ambulatory Visit: Payer: Self-pay | Admitting: Family Medicine

## 2023-05-17 DIAGNOSIS — I63512 Cerebral infarction due to unspecified occlusion or stenosis of left middle cerebral artery: Secondary | ICD-10-CM

## 2023-05-17 DIAGNOSIS — I10 Essential (primary) hypertension: Secondary | ICD-10-CM

## 2023-05-20 DIAGNOSIS — E785 Hyperlipidemia, unspecified: Secondary | ICD-10-CM | POA: Diagnosis not present

## 2023-05-20 DIAGNOSIS — C44619 Basal cell carcinoma of skin of left upper limb, including shoulder: Secondary | ICD-10-CM | POA: Diagnosis not present

## 2023-05-20 DIAGNOSIS — Z510 Encounter for antineoplastic radiation therapy: Secondary | ICD-10-CM | POA: Diagnosis not present

## 2023-05-20 DIAGNOSIS — Z7901 Long term (current) use of anticoagulants: Secondary | ICD-10-CM | POA: Diagnosis not present

## 2023-05-20 DIAGNOSIS — L409 Psoriasis, unspecified: Secondary | ICD-10-CM | POA: Diagnosis not present

## 2023-05-20 DIAGNOSIS — I4891 Unspecified atrial fibrillation: Secondary | ICD-10-CM | POA: Diagnosis not present

## 2023-05-20 DIAGNOSIS — C4491 Basal cell carcinoma of skin, unspecified: Secondary | ICD-10-CM | POA: Diagnosis not present

## 2023-05-20 DIAGNOSIS — I10 Essential (primary) hypertension: Secondary | ICD-10-CM | POA: Diagnosis not present

## 2023-05-21 DIAGNOSIS — E785 Hyperlipidemia, unspecified: Secondary | ICD-10-CM | POA: Diagnosis not present

## 2023-05-21 DIAGNOSIS — I4891 Unspecified atrial fibrillation: Secondary | ICD-10-CM | POA: Diagnosis not present

## 2023-05-21 DIAGNOSIS — Z510 Encounter for antineoplastic radiation therapy: Secondary | ICD-10-CM | POA: Diagnosis not present

## 2023-05-21 DIAGNOSIS — Z7901 Long term (current) use of anticoagulants: Secondary | ICD-10-CM | POA: Diagnosis not present

## 2023-05-21 DIAGNOSIS — L409 Psoriasis, unspecified: Secondary | ICD-10-CM | POA: Diagnosis not present

## 2023-05-21 DIAGNOSIS — I10 Essential (primary) hypertension: Secondary | ICD-10-CM | POA: Diagnosis not present

## 2023-05-21 DIAGNOSIS — C44619 Basal cell carcinoma of skin of left upper limb, including shoulder: Secondary | ICD-10-CM | POA: Diagnosis not present

## 2023-05-27 DIAGNOSIS — L409 Psoriasis, unspecified: Secondary | ICD-10-CM | POA: Diagnosis not present

## 2023-05-27 DIAGNOSIS — Z510 Encounter for antineoplastic radiation therapy: Secondary | ICD-10-CM | POA: Diagnosis not present

## 2023-05-27 DIAGNOSIS — I4891 Unspecified atrial fibrillation: Secondary | ICD-10-CM | POA: Diagnosis not present

## 2023-05-27 DIAGNOSIS — C44619 Basal cell carcinoma of skin of left upper limb, including shoulder: Secondary | ICD-10-CM | POA: Diagnosis not present

## 2023-05-27 DIAGNOSIS — Z7901 Long term (current) use of anticoagulants: Secondary | ICD-10-CM | POA: Diagnosis not present

## 2023-05-27 DIAGNOSIS — E785 Hyperlipidemia, unspecified: Secondary | ICD-10-CM | POA: Diagnosis not present

## 2023-05-27 DIAGNOSIS — I10 Essential (primary) hypertension: Secondary | ICD-10-CM | POA: Diagnosis not present

## 2023-06-01 DIAGNOSIS — C44619 Basal cell carcinoma of skin of left upper limb, including shoulder: Secondary | ICD-10-CM | POA: Diagnosis not present

## 2023-06-01 DIAGNOSIS — I10 Essential (primary) hypertension: Secondary | ICD-10-CM | POA: Diagnosis not present

## 2023-06-01 DIAGNOSIS — Z510 Encounter for antineoplastic radiation therapy: Secondary | ICD-10-CM | POA: Diagnosis not present

## 2023-06-01 DIAGNOSIS — Z7901 Long term (current) use of anticoagulants: Secondary | ICD-10-CM | POA: Diagnosis not present

## 2023-06-01 DIAGNOSIS — I4891 Unspecified atrial fibrillation: Secondary | ICD-10-CM | POA: Diagnosis not present

## 2023-06-01 DIAGNOSIS — L409 Psoriasis, unspecified: Secondary | ICD-10-CM | POA: Diagnosis not present

## 2023-06-01 DIAGNOSIS — E785 Hyperlipidemia, unspecified: Secondary | ICD-10-CM | POA: Diagnosis not present

## 2023-06-02 DIAGNOSIS — I4891 Unspecified atrial fibrillation: Secondary | ICD-10-CM | POA: Diagnosis not present

## 2023-06-02 DIAGNOSIS — I10 Essential (primary) hypertension: Secondary | ICD-10-CM | POA: Diagnosis not present

## 2023-06-02 DIAGNOSIS — C44619 Basal cell carcinoma of skin of left upper limb, including shoulder: Secondary | ICD-10-CM | POA: Diagnosis not present

## 2023-06-02 DIAGNOSIS — Z510 Encounter for antineoplastic radiation therapy: Secondary | ICD-10-CM | POA: Diagnosis not present

## 2023-06-02 DIAGNOSIS — Z7901 Long term (current) use of anticoagulants: Secondary | ICD-10-CM | POA: Diagnosis not present

## 2023-06-02 DIAGNOSIS — L409 Psoriasis, unspecified: Secondary | ICD-10-CM | POA: Diagnosis not present

## 2023-06-02 DIAGNOSIS — E785 Hyperlipidemia, unspecified: Secondary | ICD-10-CM | POA: Diagnosis not present

## 2023-06-03 DIAGNOSIS — I4891 Unspecified atrial fibrillation: Secondary | ICD-10-CM | POA: Diagnosis not present

## 2023-06-03 DIAGNOSIS — L409 Psoriasis, unspecified: Secondary | ICD-10-CM | POA: Diagnosis not present

## 2023-06-03 DIAGNOSIS — Z510 Encounter for antineoplastic radiation therapy: Secondary | ICD-10-CM | POA: Diagnosis not present

## 2023-06-03 DIAGNOSIS — Z7901 Long term (current) use of anticoagulants: Secondary | ICD-10-CM | POA: Diagnosis not present

## 2023-06-03 DIAGNOSIS — C44619 Basal cell carcinoma of skin of left upper limb, including shoulder: Secondary | ICD-10-CM | POA: Diagnosis not present

## 2023-06-03 DIAGNOSIS — E785 Hyperlipidemia, unspecified: Secondary | ICD-10-CM | POA: Diagnosis not present

## 2023-06-03 DIAGNOSIS — I10 Essential (primary) hypertension: Secondary | ICD-10-CM | POA: Diagnosis not present

## 2023-06-04 DIAGNOSIS — I10 Essential (primary) hypertension: Secondary | ICD-10-CM | POA: Diagnosis not present

## 2023-06-04 DIAGNOSIS — I4891 Unspecified atrial fibrillation: Secondary | ICD-10-CM | POA: Diagnosis not present

## 2023-06-04 DIAGNOSIS — E785 Hyperlipidemia, unspecified: Secondary | ICD-10-CM | POA: Diagnosis not present

## 2023-06-04 DIAGNOSIS — C44619 Basal cell carcinoma of skin of left upper limb, including shoulder: Secondary | ICD-10-CM | POA: Diagnosis not present

## 2023-06-04 DIAGNOSIS — Z510 Encounter for antineoplastic radiation therapy: Secondary | ICD-10-CM | POA: Diagnosis not present

## 2023-06-04 DIAGNOSIS — L409 Psoriasis, unspecified: Secondary | ICD-10-CM | POA: Diagnosis not present

## 2023-06-04 DIAGNOSIS — Z7901 Long term (current) use of anticoagulants: Secondary | ICD-10-CM | POA: Diagnosis not present

## 2023-06-08 DIAGNOSIS — L409 Psoriasis, unspecified: Secondary | ICD-10-CM | POA: Diagnosis not present

## 2023-06-08 DIAGNOSIS — I10 Essential (primary) hypertension: Secondary | ICD-10-CM | POA: Diagnosis not present

## 2023-06-08 DIAGNOSIS — C44619 Basal cell carcinoma of skin of left upper limb, including shoulder: Secondary | ICD-10-CM | POA: Diagnosis not present

## 2023-06-08 DIAGNOSIS — I4891 Unspecified atrial fibrillation: Secondary | ICD-10-CM | POA: Diagnosis not present

## 2023-06-08 DIAGNOSIS — Z510 Encounter for antineoplastic radiation therapy: Secondary | ICD-10-CM | POA: Diagnosis not present

## 2023-06-08 DIAGNOSIS — Z7901 Long term (current) use of anticoagulants: Secondary | ICD-10-CM | POA: Diagnosis not present

## 2023-06-08 DIAGNOSIS — E785 Hyperlipidemia, unspecified: Secondary | ICD-10-CM | POA: Diagnosis not present

## 2023-06-09 DIAGNOSIS — I10 Essential (primary) hypertension: Secondary | ICD-10-CM | POA: Diagnosis not present

## 2023-06-09 DIAGNOSIS — C44619 Basal cell carcinoma of skin of left upper limb, including shoulder: Secondary | ICD-10-CM | POA: Diagnosis not present

## 2023-06-09 DIAGNOSIS — Z7901 Long term (current) use of anticoagulants: Secondary | ICD-10-CM | POA: Diagnosis not present

## 2023-06-09 DIAGNOSIS — L409 Psoriasis, unspecified: Secondary | ICD-10-CM | POA: Diagnosis not present

## 2023-06-09 DIAGNOSIS — I4891 Unspecified atrial fibrillation: Secondary | ICD-10-CM | POA: Diagnosis not present

## 2023-06-09 DIAGNOSIS — E785 Hyperlipidemia, unspecified: Secondary | ICD-10-CM | POA: Diagnosis not present

## 2023-06-09 DIAGNOSIS — Z510 Encounter for antineoplastic radiation therapy: Secondary | ICD-10-CM | POA: Diagnosis not present

## 2023-06-10 DIAGNOSIS — I4891 Unspecified atrial fibrillation: Secondary | ICD-10-CM | POA: Diagnosis not present

## 2023-06-10 DIAGNOSIS — I10 Essential (primary) hypertension: Secondary | ICD-10-CM | POA: Diagnosis not present

## 2023-06-10 DIAGNOSIS — E785 Hyperlipidemia, unspecified: Secondary | ICD-10-CM | POA: Diagnosis not present

## 2023-06-10 DIAGNOSIS — Z510 Encounter for antineoplastic radiation therapy: Secondary | ICD-10-CM | POA: Diagnosis not present

## 2023-06-10 DIAGNOSIS — Z7901 Long term (current) use of anticoagulants: Secondary | ICD-10-CM | POA: Diagnosis not present

## 2023-06-10 DIAGNOSIS — C44619 Basal cell carcinoma of skin of left upper limb, including shoulder: Secondary | ICD-10-CM | POA: Diagnosis not present

## 2023-06-10 DIAGNOSIS — L409 Psoriasis, unspecified: Secondary | ICD-10-CM | POA: Diagnosis not present

## 2023-06-11 DIAGNOSIS — E785 Hyperlipidemia, unspecified: Secondary | ICD-10-CM | POA: Diagnosis not present

## 2023-06-11 DIAGNOSIS — Z510 Encounter for antineoplastic radiation therapy: Secondary | ICD-10-CM | POA: Diagnosis not present

## 2023-06-11 DIAGNOSIS — I4891 Unspecified atrial fibrillation: Secondary | ICD-10-CM | POA: Diagnosis not present

## 2023-06-11 DIAGNOSIS — L409 Psoriasis, unspecified: Secondary | ICD-10-CM | POA: Diagnosis not present

## 2023-06-11 DIAGNOSIS — C44619 Basal cell carcinoma of skin of left upper limb, including shoulder: Secondary | ICD-10-CM | POA: Diagnosis not present

## 2023-06-11 DIAGNOSIS — I10 Essential (primary) hypertension: Secondary | ICD-10-CM | POA: Diagnosis not present

## 2023-06-11 DIAGNOSIS — Z7901 Long term (current) use of anticoagulants: Secondary | ICD-10-CM | POA: Diagnosis not present

## 2023-06-14 DIAGNOSIS — Z510 Encounter for antineoplastic radiation therapy: Secondary | ICD-10-CM | POA: Diagnosis not present

## 2023-06-14 DIAGNOSIS — I10 Essential (primary) hypertension: Secondary | ICD-10-CM | POA: Diagnosis not present

## 2023-06-14 DIAGNOSIS — C44619 Basal cell carcinoma of skin of left upper limb, including shoulder: Secondary | ICD-10-CM | POA: Diagnosis not present

## 2023-06-14 DIAGNOSIS — Z7901 Long term (current) use of anticoagulants: Secondary | ICD-10-CM | POA: Diagnosis not present

## 2023-06-14 DIAGNOSIS — L409 Psoriasis, unspecified: Secondary | ICD-10-CM | POA: Diagnosis not present

## 2023-06-14 DIAGNOSIS — E785 Hyperlipidemia, unspecified: Secondary | ICD-10-CM | POA: Diagnosis not present

## 2023-06-14 DIAGNOSIS — I4891 Unspecified atrial fibrillation: Secondary | ICD-10-CM | POA: Diagnosis not present

## 2023-06-15 ENCOUNTER — Other Ambulatory Visit: Payer: Self-pay | Admitting: Family Medicine

## 2023-06-15 DIAGNOSIS — E785 Hyperlipidemia, unspecified: Secondary | ICD-10-CM | POA: Diagnosis not present

## 2023-06-15 DIAGNOSIS — I4891 Unspecified atrial fibrillation: Secondary | ICD-10-CM | POA: Diagnosis not present

## 2023-06-15 DIAGNOSIS — C44619 Basal cell carcinoma of skin of left upper limb, including shoulder: Secondary | ICD-10-CM | POA: Diagnosis not present

## 2023-06-15 DIAGNOSIS — L409 Psoriasis, unspecified: Secondary | ICD-10-CM | POA: Diagnosis not present

## 2023-06-15 DIAGNOSIS — Z510 Encounter for antineoplastic radiation therapy: Secondary | ICD-10-CM | POA: Diagnosis not present

## 2023-06-15 DIAGNOSIS — Z7901 Long term (current) use of anticoagulants: Secondary | ICD-10-CM | POA: Diagnosis not present

## 2023-06-15 DIAGNOSIS — G479 Sleep disorder, unspecified: Secondary | ICD-10-CM

## 2023-06-15 DIAGNOSIS — I10 Essential (primary) hypertension: Secondary | ICD-10-CM | POA: Diagnosis not present

## 2023-06-16 DIAGNOSIS — C44619 Basal cell carcinoma of skin of left upper limb, including shoulder: Secondary | ICD-10-CM | POA: Diagnosis not present

## 2023-06-16 DIAGNOSIS — I4891 Unspecified atrial fibrillation: Secondary | ICD-10-CM | POA: Diagnosis not present

## 2023-06-16 DIAGNOSIS — Z7901 Long term (current) use of anticoagulants: Secondary | ICD-10-CM | POA: Diagnosis not present

## 2023-06-16 DIAGNOSIS — I10 Essential (primary) hypertension: Secondary | ICD-10-CM | POA: Diagnosis not present

## 2023-06-16 DIAGNOSIS — L409 Psoriasis, unspecified: Secondary | ICD-10-CM | POA: Diagnosis not present

## 2023-06-16 DIAGNOSIS — E785 Hyperlipidemia, unspecified: Secondary | ICD-10-CM | POA: Diagnosis not present

## 2023-06-16 DIAGNOSIS — Z510 Encounter for antineoplastic radiation therapy: Secondary | ICD-10-CM | POA: Diagnosis not present

## 2023-06-17 DIAGNOSIS — I10 Essential (primary) hypertension: Secondary | ICD-10-CM | POA: Diagnosis not present

## 2023-06-17 DIAGNOSIS — Z7901 Long term (current) use of anticoagulants: Secondary | ICD-10-CM | POA: Diagnosis not present

## 2023-06-17 DIAGNOSIS — C44619 Basal cell carcinoma of skin of left upper limb, including shoulder: Secondary | ICD-10-CM | POA: Diagnosis not present

## 2023-06-17 DIAGNOSIS — I4891 Unspecified atrial fibrillation: Secondary | ICD-10-CM | POA: Diagnosis not present

## 2023-06-17 DIAGNOSIS — L409 Psoriasis, unspecified: Secondary | ICD-10-CM | POA: Diagnosis not present

## 2023-06-17 DIAGNOSIS — E785 Hyperlipidemia, unspecified: Secondary | ICD-10-CM | POA: Diagnosis not present

## 2023-06-17 DIAGNOSIS — Z510 Encounter for antineoplastic radiation therapy: Secondary | ICD-10-CM | POA: Diagnosis not present

## 2023-06-18 DIAGNOSIS — L409 Psoriasis, unspecified: Secondary | ICD-10-CM | POA: Diagnosis not present

## 2023-06-18 DIAGNOSIS — C44619 Basal cell carcinoma of skin of left upper limb, including shoulder: Secondary | ICD-10-CM | POA: Diagnosis not present

## 2023-06-18 DIAGNOSIS — I10 Essential (primary) hypertension: Secondary | ICD-10-CM | POA: Diagnosis not present

## 2023-06-18 DIAGNOSIS — I4891 Unspecified atrial fibrillation: Secondary | ICD-10-CM | POA: Diagnosis not present

## 2023-06-18 DIAGNOSIS — E785 Hyperlipidemia, unspecified: Secondary | ICD-10-CM | POA: Diagnosis not present

## 2023-06-18 DIAGNOSIS — Z510 Encounter for antineoplastic radiation therapy: Secondary | ICD-10-CM | POA: Diagnosis not present

## 2023-06-18 DIAGNOSIS — Z7901 Long term (current) use of anticoagulants: Secondary | ICD-10-CM | POA: Diagnosis not present

## 2023-06-21 DIAGNOSIS — E785 Hyperlipidemia, unspecified: Secondary | ICD-10-CM | POA: Diagnosis not present

## 2023-06-21 DIAGNOSIS — C44619 Basal cell carcinoma of skin of left upper limb, including shoulder: Secondary | ICD-10-CM | POA: Diagnosis not present

## 2023-06-21 DIAGNOSIS — I4891 Unspecified atrial fibrillation: Secondary | ICD-10-CM | POA: Diagnosis not present

## 2023-06-21 DIAGNOSIS — Z510 Encounter for antineoplastic radiation therapy: Secondary | ICD-10-CM | POA: Diagnosis not present

## 2023-06-21 DIAGNOSIS — L409 Psoriasis, unspecified: Secondary | ICD-10-CM | POA: Diagnosis not present

## 2023-06-21 DIAGNOSIS — I10 Essential (primary) hypertension: Secondary | ICD-10-CM | POA: Diagnosis not present

## 2023-06-21 DIAGNOSIS — Z7901 Long term (current) use of anticoagulants: Secondary | ICD-10-CM | POA: Diagnosis not present

## 2023-06-22 DIAGNOSIS — Z7901 Long term (current) use of anticoagulants: Secondary | ICD-10-CM | POA: Diagnosis not present

## 2023-06-22 DIAGNOSIS — I10 Essential (primary) hypertension: Secondary | ICD-10-CM | POA: Diagnosis not present

## 2023-06-22 DIAGNOSIS — L409 Psoriasis, unspecified: Secondary | ICD-10-CM | POA: Diagnosis not present

## 2023-06-22 DIAGNOSIS — E785 Hyperlipidemia, unspecified: Secondary | ICD-10-CM | POA: Diagnosis not present

## 2023-06-22 DIAGNOSIS — C44619 Basal cell carcinoma of skin of left upper limb, including shoulder: Secondary | ICD-10-CM | POA: Diagnosis not present

## 2023-06-22 DIAGNOSIS — Z510 Encounter for antineoplastic radiation therapy: Secondary | ICD-10-CM | POA: Diagnosis not present

## 2023-06-22 DIAGNOSIS — I4891 Unspecified atrial fibrillation: Secondary | ICD-10-CM | POA: Diagnosis not present

## 2023-06-23 DIAGNOSIS — L409 Psoriasis, unspecified: Secondary | ICD-10-CM | POA: Diagnosis not present

## 2023-06-23 DIAGNOSIS — I10 Essential (primary) hypertension: Secondary | ICD-10-CM | POA: Diagnosis not present

## 2023-06-23 DIAGNOSIS — C44619 Basal cell carcinoma of skin of left upper limb, including shoulder: Secondary | ICD-10-CM | POA: Diagnosis not present

## 2023-06-23 DIAGNOSIS — I4891 Unspecified atrial fibrillation: Secondary | ICD-10-CM | POA: Diagnosis not present

## 2023-06-23 DIAGNOSIS — Z7901 Long term (current) use of anticoagulants: Secondary | ICD-10-CM | POA: Diagnosis not present

## 2023-06-23 DIAGNOSIS — E785 Hyperlipidemia, unspecified: Secondary | ICD-10-CM | POA: Diagnosis not present

## 2023-06-23 DIAGNOSIS — Z510 Encounter for antineoplastic radiation therapy: Secondary | ICD-10-CM | POA: Diagnosis not present

## 2023-06-24 DIAGNOSIS — E785 Hyperlipidemia, unspecified: Secondary | ICD-10-CM | POA: Diagnosis not present

## 2023-06-24 DIAGNOSIS — I4891 Unspecified atrial fibrillation: Secondary | ICD-10-CM | POA: Diagnosis not present

## 2023-06-24 DIAGNOSIS — C44619 Basal cell carcinoma of skin of left upper limb, including shoulder: Secondary | ICD-10-CM | POA: Diagnosis not present

## 2023-06-24 DIAGNOSIS — L409 Psoriasis, unspecified: Secondary | ICD-10-CM | POA: Diagnosis not present

## 2023-06-24 DIAGNOSIS — I10 Essential (primary) hypertension: Secondary | ICD-10-CM | POA: Diagnosis not present

## 2023-06-24 DIAGNOSIS — Z510 Encounter for antineoplastic radiation therapy: Secondary | ICD-10-CM | POA: Diagnosis not present

## 2023-06-24 DIAGNOSIS — Z7901 Long term (current) use of anticoagulants: Secondary | ICD-10-CM | POA: Diagnosis not present

## 2023-06-24 NOTE — Progress Notes (Signed)
 Subjective: CC: INR check PCP: Eliodoro Guerin, DO Dominique Farley is a 89 y.o. female presenting to clinic today for:  1.  Atrial fibrillation with history of CVA Patient's goal is 2-3 She is compliant with her Coumadin .  No missed doses.  No changes in diet.  She has been undergoing treatment for the left squamous cell carcinoma of the upper extremity and the lesions look a lot better.  She just finished her 18th treatment today and is now done.  They have been thrilled about how she has done and report no concerns today.  Denies any vaginal or rectal bleeding.   ROS: Per HPI  Allergies  Allergen Reactions   Codeine Other (See Comments)   Influenza Vaccines    Remeron  [Mirtazapine ] Other (See Comments)    Dizziness    Sulfa Antibiotics Hives   Past Medical History:  Diagnosis Date   Acute cystitis with hematuria 11/22/2020   Atrial fibrillation (HCC)    BCC (basal cell carcinoma of skin) 11/14/2004   Right Mid Cheek(Cx3,5FU)   BCC (basal cell carcinoma of skin) 04/16/2011   Right Crease(tx p bx)   BCC (basal cell carcinoma of skin) 06/16/2016   Left Nare(tx p bx)   BCC (basal cell carcinoma of skin) Atypical Basaloid 09/29/2018   Left Side Nose(tx p bx)   Cerebrovascular accident (CVA) due to occlusion of left middle cerebral artery (HCC) 10/18/2018   Hypertension    Osteoporosis    Pneumonia of lower lobe due to infectious organism 11/20/2020   SCC (squamous cell carcinoma) 09/29/2018   Left Jawline(tx p bx)   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   Right Jawline(Cx3,5FU) and Left Upper Cheek(Cx3,5FU)   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   Left Jawline(tx p bx), Nose(tx p bx), Right Cheek(tx p bx), and Right Forearm(tx p bx)   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   Right Temple(Cx3,5FU) and Left Outer Eye(Cx3,5FU)   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   Right Forearm(Cx3,5FU)   Squamous cell carcinoma in situ  (SCCIS) 03/13/2005   Left Side of Nose(Cx3,5FU)   Squamous cell carcinoma in situ (SCCIS) 06/04/2005   Inner Right Cheek/Eye(Tx p Bx)   Squamous cell carcinoma in situ (SCCIS) 02/12/2010   Right Cheek(tx p bx)   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 2 09/29/2018   Upper Lip(tx p bx) and Bridge of Nose(tx p bx)   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   Left Forehead, Left Neck, Right Nose, and Right Hand   Stroke (HCC)    Tremor    Weakness 11/20/2020    Current Outpatient Medications:    amLODipine  (NORVASC ) 5 MG tablet, TAKE 1 AND 1/2 TABLETS BY MOUTH DAILY, Disp: 135 tablet, Rfl: 0   aspirin EC 81 MG tablet, Take 81 mg by mouth 3 (three) times a week., Disp: , Rfl:    atorvastatin  (LIPITOR) 40 MG tablet, TAKE 1 TABLET BY MOUTH AT BEDTIME, Disp: 90 tablet, Rfl: 0   busPIRone  (BUSPAR ) 10 MG tablet, TAKE 1 TABLET BY MOUTH AT BEDTIME, Disp: 180 tablet, Rfl: 0   clobetasol  cream (TEMOVATE ) 0.05 %, APPLY TO THE AFFECTED AREA(S) TWICE DAILY UNTIL PSORIATIC LESION RESOLVED, Disp: 120 g, Rfl: 2   lisinopril  (ZESTRIL ) 20 MG tablet, Take 1 tablet (20 mg total) by mouth daily., Disp: 90 tablet, Rfl: 1   polyethylene glycol (MIRALAX  / GLYCOLAX ) 17 g packet, Take 17 g by mouth  daily., Disp: , Rfl:    primidone  (MYSOLINE ) 50 MG tablet, TAKE 1 AND 1/2 TABLETS BY MOUTH THREE TIMES DAILY, Disp: 405 tablet, Rfl: 0   warfarin (COUMADIN ) 5 MG tablet, TAKE 1 AND 1/2 TABLETS BY MOUTH EVERY DAY EXCEPT TAKE 1 TABLETS ON TUESDAY, Disp: 135 tablet, Rfl: 3 Social History   Socioeconomic History   Marital status: Widowed    Spouse name: Not on file   Number of children: 3   Years of education: Not on file   Highest education level: Not on file  Occupational History   Occupation: Retired  Tobacco Use   Smoking status: Never   Smokeless tobacco: Never  Vaping Use   Vaping status: Never Used  Substance and Sexual Activity   Alcohol use: Never   Drug use: Never   Sexual activity: Not  Currently  Other Topics Concern   Not on file  Social History Narrative   Not on file   Social Drivers of Health   Financial Resource Strain: Low Risk  (11/21/2020)   Received [facility]   Overall Financial Resource Strain (CARDIA)    Difficulty of Paying Living Expenses: Not hard at all  Food Insecurity: No Food Insecurity (11/21/2020)   Received from [facility]   Hunger Vital Sign    Worried About Running Out of Food in the Last Year: Never true    Ran Out of Food in the Last Year: Never true  Transportation Needs: No Transportation Needs (11/21/2020)   Received from [facility] - Transportation    Lack of Transportation (Medical): No    Lack of Transportation (Non-Medical): No  Physical Activity: Not on file  Stress: Not on file  Social Connections: Unknown (05/27/2021)   Received from [facility]   Social Network    Social Network: Not on file  Intimate Partner Violence: Unknown (04/18/2021)   Received from [facility]   HITS    Physically Hurt: Not on file    Insult or Talk Down To: Not on file    Threaten Physical Harm: Not on file    Scream or Curse: Not on file   Family History  Problem Relation Age of Onset   Dementia Mother    Stroke Father    Heart disease Father    Diabetes Sister    Stroke Son     Objective: Office vital signs reviewed. BP 102/60   Pulse 77   Temp 98.2 F (36.8 C)   Ht 5' 1 (1.549 m)   Wt 132 lb (59.9 kg)   SpO2 97%   BMI 24.94 kg/m   Physical Examination:  General: Awake, alert, well appearing elderly female, No acute distress HEENT: Resting jaw tremor present Cardio: Irregularly irregular with rate controlled.  S1S2 heard, no murmurs appreciated Pulm: clear to auscultation bilaterally, no wheezes, rhonchi or rales; normal work of breathing on room air    Assessment/ Plan: 89 y.o. female   Longstanding persistent atrial fibrillation (HCC) - Plan: CoaguChek XS/INR Waived, Hemoglobin,  fingerstick, CBC  Anticoagulation goal of INR 2 to 3 - Plan: CoaguChek XS/INR Waived, Hemoglobin, fingerstick, CBC  INR therapeutic at 3.0 but hemoglobin fingerstick was low at 8.6.  This is a large difference between what her normal levels are so I am going to have a CBC drawn to make sure this is not an erroneous level.  If it is true then [place] have to look for bleed as this is about  a 3 point drop from her baseline  Her INR check has been scheduled for 8 weeks out   Eliodoro Guerin, DO [facility] [phone]

## 2023-06-25 ENCOUNTER — Ambulatory Visit (INDEPENDENT_AMBULATORY_CARE_PROVIDER_SITE_OTHER): Admitting: Family Medicine

## 2023-06-25 ENCOUNTER — Encounter: Payer: Self-pay | Admitting: Family Medicine

## 2023-06-25 VITALS — BP 102/60 | HR 77 | Temp 98.2°F | Ht 61.0 in | Wt 132.0 lb

## 2023-06-25 DIAGNOSIS — Z5181 Encounter for therapeutic drug level monitoring: Secondary | ICD-10-CM

## 2023-06-25 DIAGNOSIS — Z7901 Long term (current) use of anticoagulants: Secondary | ICD-10-CM | POA: Diagnosis not present

## 2023-06-25 DIAGNOSIS — I10 Essential (primary) hypertension: Secondary | ICD-10-CM | POA: Diagnosis not present

## 2023-06-25 DIAGNOSIS — L409 Psoriasis, unspecified: Secondary | ICD-10-CM | POA: Diagnosis not present

## 2023-06-25 DIAGNOSIS — Z510 Encounter for antineoplastic radiation therapy: Secondary | ICD-10-CM | POA: Diagnosis not present

## 2023-06-25 DIAGNOSIS — I4891 Unspecified atrial fibrillation: Secondary | ICD-10-CM | POA: Diagnosis not present

## 2023-06-25 DIAGNOSIS — I4811 Longstanding persistent atrial fibrillation: Secondary | ICD-10-CM | POA: Diagnosis not present

## 2023-06-25 DIAGNOSIS — C44619 Basal cell carcinoma of skin of left upper limb, including shoulder: Secondary | ICD-10-CM | POA: Diagnosis not present

## 2023-06-25 DIAGNOSIS — E785 Hyperlipidemia, unspecified: Secondary | ICD-10-CM | POA: Diagnosis not present

## 2023-06-25 LAB — COAGUCHEK XS/INR WAIVED
INR: 3 — ABNORMAL HIGH (ref 0.9–1.1)
Prothrombin Time: 36.4 s

## 2023-06-25 LAB — HEMOGLOBIN, FINGERSTICK: Hemoglobin: 8.6 g/dL — ABNORMAL LOW (ref 11.1–15.9)

## 2023-06-26 LAB — CBC
Hematocrit: 37.5 % (ref 34.0–46.6)
Hemoglobin: 12.3 g/dL (ref 11.1–15.9)
MCH: 32.7 pg (ref 26.6–33.0)
MCHC: 32.8 g/dL (ref 31.5–35.7)
MCV: 100 fL — ABNORMAL HIGH (ref 79–97)
Platelets: 279 10*3/uL (ref 150–450)
RBC: 3.76 x10E6/uL — ABNORMAL LOW (ref 3.77–5.28)
RDW: 13.7 % (ref 11.7–15.4)
WBC: 8.9 10*3/uL (ref 3.4–10.8)

## 2023-06-28 ENCOUNTER — Ambulatory Visit: Payer: Self-pay | Admitting: Family Medicine

## 2023-07-15 ENCOUNTER — Other Ambulatory Visit: Payer: Self-pay | Admitting: Cardiology

## 2023-07-15 DIAGNOSIS — I10 Essential (primary) hypertension: Secondary | ICD-10-CM

## 2023-07-22 ENCOUNTER — Encounter: Payer: Self-pay | Admitting: Nurse Practitioner

## 2023-07-22 ENCOUNTER — Ambulatory Visit: Payer: Self-pay

## 2023-07-22 ENCOUNTER — Ambulatory Visit: Admitting: Nurse Practitioner

## 2023-07-22 ENCOUNTER — Ambulatory Visit: Payer: Self-pay | Admitting: Nurse Practitioner

## 2023-07-22 ENCOUNTER — Ambulatory Visit (INDEPENDENT_AMBULATORY_CARE_PROVIDER_SITE_OTHER)

## 2023-07-22 VITALS — BP 129/83 | HR 69 | Temp 98.2°F | Ht 61.0 in | Wt 129.0 lb

## 2023-07-22 DIAGNOSIS — S81819A Laceration without foreign body, unspecified lower leg, initial encounter: Secondary | ICD-10-CM

## 2023-07-22 DIAGNOSIS — M25562 Pain in left knee: Secondary | ICD-10-CM

## 2023-07-22 DIAGNOSIS — L03115 Cellulitis of right lower limb: Secondary | ICD-10-CM | POA: Insufficient documentation

## 2023-07-22 DIAGNOSIS — M25462 Effusion, left knee: Secondary | ICD-10-CM | POA: Diagnosis not present

## 2023-07-22 DIAGNOSIS — S81012A Laceration without foreign body, left knee, initial encounter: Secondary | ICD-10-CM

## 2023-07-22 DIAGNOSIS — M1712 Unilateral primary osteoarthritis, left knee: Secondary | ICD-10-CM | POA: Diagnosis not present

## 2023-07-22 MED ORDER — AMOXICILLIN 875 MG PO TABS
875.0000 mg | ORAL_TABLET | Freq: Two times a day (BID) | ORAL | 0 refills | Status: DC
Start: 2023-07-22 — End: 2023-08-17

## 2023-07-22 MED ORDER — TRIPLE ANTIBIOTIC 3.5-400-5000 EX OINT: 1.0000 | TOPICAL_OINTMENT | Freq: Two times a day (BID) | CUTANEOUS | 0 refills | Status: DC | PRN

## 2023-07-22 NOTE — Telephone Encounter (Signed)
 APPT MADE

## 2023-07-22 NOTE — Telephone Encounter (Signed)
 FYI Only or Action Required?: FYI only for provider.  Patient was last seen in primary care on 06/25/2023 by Jolinda Norene HERO, DO.  Called Nurse Triage reporting Joint Swelling.  Symptoms began several days ago.  Interventions attempted: Rest, hydration, or home remedies.  Symptoms are: gradually improving.  Triage Disposition: See Physician Within 24 Hours  Patient/caregiver understands and will follow disposition?: Yes       Copied from CRM [id number]. Topic: Clinical - Red Word Triage >> Jul 22, 2023 10:56 AM Carlatta H wrote: [facility] that prompted transfer to Nurse Triage: A few days ago the patient stood up and almost fell with the home health aid catching her but she still has a place on her leg that is skinned up and above her knee is swollen// Reason for Disposition  [1] Systolic BP 90-110 AND [2] taking blood pressure medications AND [3] NOT feeling weak or lightheaded  Answer Assessment - Initial Assessment Questions 1. BLOOD PRESSURE: What is your blood pressure? Did you take at least two measurements 5 minutes apart?     No 84/55 on this morning  just rechecked and now up to 114/63 P75 2. ONSET: When did you take your blood pressure?     This morning 3. HOW: How did you take your blood pressure? (e.g., visiting nurse, automatic home BP monitor)     Home health aid 4. HISTORY: Do you have a history of low blood pressure? What is your blood pressure normally?     no 5. MEDICINES: Are you taking any medicines for blood pressure? If Yes, ask: Have they been changed recently?     Yes taking BP medication Norvasc  and lisinopril  6. PULSE RATE: Do you know what your pulse rate is?      74 7. OTHER SYMPTOMS: Have you been sick recently? Have you had a recent injury?     Has a skin tear and swelling to left thigh, complained of dizziness two days ago.  Protocols used: Blood Pressure - Low-A-AH

## 2023-07-22 NOTE — Progress Notes (Signed)
 Acute Office Visit  Subjective:     Patient ID: Dominique Farley, female    DOB: 10-14-1926, 89 y.o.   MRN: [id number]  Chief Complaint  Patient presents with   near fall    Low b/p readings Swollen left knee Skin tear    HPI Dominique Farley is a 89 year old female who presents with her daughter on July 22, 2023, for evaluation of a left knee skin tear and concerns regarding low blood pressure (BP) readings at home.  According to her daughter, the patient was with her caregiver when she attempted to stand. As is typical for her, she tried to walk backward toward a chair for support. She lost her balance but did not fall; the caregiver was able to catch her. During the incident, she struck her left knee on an object, resulting in a skin tear.  Her daughter also expressed concern about low BP. Home BP readings provided include:  1120: 114/63, HR 75  1212: 112/63, HR 72  1200: 99/74, HR 92  1408: 121/68, HR 76  The patient denies any associated symptoms such as dizziness, lightheadedness, chest pain, or syncope. She is asymptomatic and ambulatory with assistance. Daughter was reassured that current BP readings are within acceptable ranges for her age and clinical status. Active Ambulatory Problems    Diagnosis Date Noted   Tremor 08/24/2018   Essential hypertension 08/24/2018   Age related osteoporosis 08/24/2018   Atrial fibrillation (HCC) 11/04/2018   Anticoagulation goal of INR 2 to 3 11/04/2018   Bilateral primary osteoarthritis of knee 06/14/2019   Squamous cell carcinoma in situ (SCCIS) of skin 10/25/2019   Chronic anticoagulation 01/24/2021   Frailty syndrome in geriatric patient 03/10/2022   History of CVA in adulthood 03/10/2022   Purpura senilis (HCC) 03/10/2022   Basal cell carcinoma (BCC) of skin of left upper extremity including shoulder 04/26/2023   Skin tear of lower leg without complication, initial encounter 07/22/2023   Acute pain of left knee 07/22/2023    Cellulitis of right leg 07/22/2023   Resolved Ambulatory Problems    Diagnosis Date Noted   Unilateral primary osteoarthritis, right knee 10/05/2018   Cerebrovascular accident (CVA) due to occlusion of left middle cerebral artery (HCC) 10/18/2018   Hypertensive emergency 10/21/2018   Unilateral primary osteoarthritis, left knee 07/12/2019   Acute cystitis with hematuria 11/22/2020   Pneumonia of lower lobe due to infectious organism 11/20/2020   Weakness 11/20/2020   Venous stasis ulcers of both lower extremities (HCC) 03/24/2022   Past Medical History:  Diagnosis Date   BCC (basal cell carcinoma of skin) 11/14/2004   BCC (basal cell carcinoma of skin) 04/16/2011   BCC (basal cell carcinoma of skin) 06/16/2016   BCC (basal cell carcinoma of skin) Atypical Basaloid 09/29/2018   Hypertension    Osteoporosis    SCC (squamous cell carcinoma) 09/29/2018   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   Squamous cell carcinoma in situ (SCCIS) 03/13/2005   Squamous cell carcinoma in situ (SCCIS) 06/04/2005   Squamous cell carcinoma in situ (SCCIS) 02/12/2010   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 2 09/29/2018   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  HENT:  Negative for  congestion and sore throat.   Respiratory:  Negative for cough and shortness of breath.   Cardiovascular:  Negative for chest pain and leg swelling.  Gastrointestinal:  Negative for diarrhea, nausea and vomiting.  Musculoskeletal:        Right knee tear  Neurological:  Negative for dizziness and headaches.   Negative unless indicated in HPI    Objective:    BP 129/83   Pulse 69   Temp 98.2 F (36.8 C)   Ht 5' 1 (1.549 m)   Wt 129 lb (58.5 kg)    SpO2 95%   BMI 24.37 kg/m  BP Readings from Last 3 Encounters:  07/22/23 129/83  06/25/23 102/60  04/26/23 124/78   Wt Readings from Last 3 Encounters:  07/22/23 129 lb (58.5 kg)  06/25/23 132 lb (59.9 kg)  04/26/23 132 lb (59.9 kg)      Physical Exam Vitals and nursing note reviewed.  Constitutional:      General: She is not in acute distress. HENT:     Head: Normocephalic and atraumatic.     Nose: Nose normal.     Mouth/Throat:     Mouth: Mucous membranes are moist.  Eyes:     Extraocular Movements: Extraocular movements intact.     Conjunctiva/sclera: Conjunctivae normal.     Pupils: Pupils are equal, round, and reactive to light.  Cardiovascular:     Heart sounds: Normal heart sounds.  Pulmonary:     Effort: Pulmonary effort is normal.  Musculoskeletal:        General: Normal range of motion.     Right lower leg: No edema.     Left lower leg: No edema.  Skin:    General: Skin is warm and dry.     Findings: No rash.  Neurological:     Mental Status: She is alert and oriented to person, place, and time.  Psychiatric:        Mood and Affect: Mood normal.        Behavior: Behavior normal.        Thought Content: Thought content normal.        Judgment: Judgment normal.     No results found for any visits on 07/22/23.      Assessment & Plan:  Skin tear of lower leg without complication, initial encounter -     Anaerobic and Aerobic Culture -     Amoxicillin ; Take 1 tablet (875 mg total) by mouth 2 (two) times daily.  Dispense: 14 tablet; Refill: 0 -     Triple Antibiotic; Apply 1 Application topically 2 (two) times daily as needed.  Dispense: 15 g; Refill: 0  Acute pain of left knee -     DG Knee 1-2 Views Left  Cellulitis of right leg    Tryniti  is a 89 yrs old Caucasian female seen today right leg tear, n acute distress Wound irrigate, cover with xeroform, and dressed with non stick dressing and wrapped  X-ray ordered awaiting final report from  radiologist Cellulitis: amoxicillin  875 mg Bid for 7 days, it was explained to the daughter based on culture result no antibiotic may be needed   The above assessment and management plan was discussed with the patient. The patient verbalized understanding of and has agreed to the management plan. Patient is aware to call the clinic if they develop any new symptoms or if symptoms persist or worsen. Patient is aware when to return to the clinic for a follow-up visit.  Patient educated on when it is appropriate to go to the emergency department.  Return if symptoms worsen or fail to improve.  Ciel Yanes [place] Thompson, DNP [facility] [address] [place], [place] [address] [phone]  Note: This document was prepared by [employer] voice dictation technology and any errors that results from this process are unintentional.

## 2023-07-26 ENCOUNTER — Other Ambulatory Visit: Payer: Self-pay | Admitting: Family Medicine

## 2023-07-26 DIAGNOSIS — G250 Essential tremor: Secondary | ICD-10-CM

## 2023-07-27 LAB — ANAEROBIC AND AEROBIC CULTURE

## 2023-07-27 MED ORDER — DOXYCYCLINE HYCLATE 100 MG PO CAPS: 100.0000 mg | ORAL_CAPSULE | Freq: Two times a day (BID) | ORAL | 0 refills | Status: DC

## 2023-08-09 ENCOUNTER — Ambulatory Visit: Payer: Self-pay

## 2023-08-09 DIAGNOSIS — I4891 Unspecified atrial fibrillation: Secondary | ICD-10-CM | POA: Diagnosis not present

## 2023-08-09 DIAGNOSIS — R5381 Other malaise: Secondary | ICD-10-CM | POA: Diagnosis not present

## 2023-08-09 DIAGNOSIS — K5649 Other impaction of intestine: Secondary | ICD-10-CM | POA: Diagnosis not present

## 2023-08-09 DIAGNOSIS — I10 Essential (primary) hypertension: Secondary | ICD-10-CM | POA: Diagnosis not present

## 2023-08-09 DIAGNOSIS — Z882 Allergy status to sulfonamides status: Secondary | ICD-10-CM | POA: Diagnosis not present

## 2023-08-09 DIAGNOSIS — R1030 Lower abdominal pain, unspecified: Secondary | ICD-10-CM | POA: Diagnosis not present

## 2023-08-09 DIAGNOSIS — Z885 Allergy status to narcotic agent status: Secondary | ICD-10-CM | POA: Diagnosis not present

## 2023-08-09 DIAGNOSIS — R188 Other ascites: Secondary | ICD-10-CM | POA: Diagnosis not present

## 2023-08-09 DIAGNOSIS — I959 Hypotension, unspecified: Secondary | ICD-10-CM | POA: Diagnosis not present

## 2023-08-09 DIAGNOSIS — N281 Cyst of kidney, acquired: Secondary | ICD-10-CM | POA: Diagnosis not present

## 2023-08-09 DIAGNOSIS — K5900 Constipation, unspecified: Secondary | ICD-10-CM | POA: Diagnosis not present

## 2023-08-09 DIAGNOSIS — E785 Hyperlipidemia, unspecified: Secondary | ICD-10-CM | POA: Diagnosis not present

## 2023-08-09 NOTE — Telephone Encounter (Signed)
 FYI Only or Action Required?: FYI only for provider.  Patient was last seen in primary care on 07/22/2023 by Dominique Morton Sebastian Nena, NP.  Called Nurse Triage reporting Low BP.  Symptoms began today.  Interventions attempted: Nothing.  Symptoms are: gradually worsening. Pt.'s BP today 78/62  80/61, pulse 80's. Feels weak per daughter. Does take BP medication.  Triage Disposition: Call EMS 911 Now  Patient/caregiver understands and will follow disposition?: Yes    Copied from CRM [id number]. Topic: Clinical - Red Word Triage >> Aug 09, 2023  9:42 AM Rosaria BRAVO wrote: Red Word that prompted transfer to Nurse Triage: BP 78/62  this morning Reason for Disposition  [1] Systolic BP < 90 AND [2] feeling weak or lightheaded (e.g., woozy, feeling like they might faint)  Answer Assessment - Initial Assessment Questions 1. BLOOD PRESSURE: What is your blood pressure? Did you take at least two measurements 5 minutes apart?     78/62 80/61 2. ONSET: When did you take your blood pressure?     Today 3. HOW: How did you take your blood pressure? (e.g., visiting nurse, automatic home BP monitor)     Home cuff 4. HISTORY: Do you have a history of low blood pressure? What is your blood pressure normally?     yes 5. MEDICINES: Are you taking any medicines for blood pressure? If Yes, ask: Have they been changed recently?     yes 6. PULSE RATE: Do you know what your pulse rate is?      89 7. OTHER SYMPTOMS: Have you been sick recently? Have you had a recent injury?     no 8. PREGNANCY: Is there any chance you are pregnant? When was your last menstrual period?     no  Protocols used: Blood Pressure - Low-A-AH

## 2023-08-09 NOTE — Telephone Encounter (Signed)
Noted  -LS

## 2023-08-16 ENCOUNTER — Encounter: Payer: Self-pay | Admitting: Family Medicine

## 2023-08-16 ENCOUNTER — Telehealth: Payer: Self-pay | Admitting: Cardiology

## 2023-08-16 NOTE — Telephone Encounter (Signed)
 Pt c/o BP issue: STAT if pt c/o blurred vision, one-sided weakness or slurred speech.  STAT if BP is GREATER than 180/120 TODAY.  STAT if BP is LESS than 90/60 and SYMPTOMATIC TODAY  1. What is your BP concern? Low blood pressure  2. Have you taken any BP medication today?Daughter stated she is on three different BP medications, and she has taken them today.   3. What are your last 5 BP readings? 08/16/23 9:56 77/57 HR 76 after meds  10am 88/59 HR 86  10:35am 127/65 HR 58  4. Are you having any other symptoms (ex. Dizziness, headache, blurred vision, passed out)? Daughter states patient feels lightheaded and doesn't feel right.  Daughter is not with patient, the caregiver is with her mother at her home.   Daughter states she took her mother to the ED last week and her BP was low then put her HR was 65 and she was told it was nothing to be concerned about.

## 2023-08-16 NOTE — Telephone Encounter (Signed)
 Luke, will you put her in that SD slot tomorrow afternoon?

## 2023-08-16 NOTE — Telephone Encounter (Signed)
 Hold the lisinopril  for now and update us  on blood pressures on Thursday  JINNY Ross MD

## 2023-08-16 NOTE — Telephone Encounter (Signed)
 Appointment scheduled.

## 2023-08-17 ENCOUNTER — Ambulatory Visit (INDEPENDENT_AMBULATORY_CARE_PROVIDER_SITE_OTHER): Admitting: Family Medicine

## 2023-08-17 ENCOUNTER — Encounter: Payer: Self-pay | Admitting: Family Medicine

## 2023-08-17 VITALS — BP 124/78 | HR 86 | Temp 97.9°F | Ht 60.0 in | Wt 130.0 lb

## 2023-08-17 DIAGNOSIS — Z5181 Encounter for therapeutic drug level monitoring: Secondary | ICD-10-CM

## 2023-08-17 DIAGNOSIS — R8290 Unspecified abnormal findings in urine: Secondary | ICD-10-CM | POA: Diagnosis not present

## 2023-08-17 DIAGNOSIS — I4811 Longstanding persistent atrial fibrillation: Secondary | ICD-10-CM

## 2023-08-17 DIAGNOSIS — Z7901 Long term (current) use of anticoagulants: Secondary | ICD-10-CM | POA: Diagnosis not present

## 2023-08-17 DIAGNOSIS — N3001 Acute cystitis with hematuria: Secondary | ICD-10-CM | POA: Diagnosis not present

## 2023-08-17 LAB — URINALYSIS, ROUTINE W REFLEX MICROSCOPIC
Bilirubin, UA: NEGATIVE
Glucose, UA: NEGATIVE
Nitrite, UA: POSITIVE — AB
Specific Gravity, UA: 1.015 (ref 1.005–1.030)
Urobilinogen, Ur: 0.2 mg/dL (ref 0.2–1.0)
pH, UA: 5.5 (ref 5.0–7.5)

## 2023-08-17 LAB — COAGUCHEK XS/INR WAIVED
INR: 2.2 — ABNORMAL HIGH (ref 0.9–1.1)
Prothrombin Time: 25.9 s

## 2023-08-17 MED ORDER — CEFTRIAXONE SODIUM 1 G IJ SOLR
1.0000 g | Freq: Once | INTRAMUSCULAR | Status: AC
  Administered 2023-08-17: 1 g via INTRAMUSCULAR

## 2023-08-17 MED ORDER — CEPHALEXIN 500 MG PO CAPS: 500.0000 mg | ORAL_CAPSULE | Freq: Three times a day (TID) | ORAL | 0 refills | Status: DC

## 2023-08-17 NOTE — Telephone Encounter (Signed)
 Spoke with daughter on yesterday and advised her of Dr. Alvan recommendations. She verbalized understanding and will send us  a MyChart message with a record of her Bps.

## 2023-08-17 NOTE — Progress Notes (Signed)
 Subjective: CC: abnormal UA PCP: Jolinda Norene HERO, DO YEP:Dominique Farley is a 89 y.o. female presenting to clinic today for:  1. UTI Seen at [facility] ER.  Had abnormal UA. Not treated for UTI.  Feeling tired.  Family saw some blood on her pad.  Concerned about UTI. No fevers, nausea, or vomiting  2.  Atrial fibrillation Patient is compliant with her Coumadin .  No missed doses.  Reports some blood on her pad as above.  Has had some issues with labile blood pressures and she notes that the lisinopril  was recently discontinued by her cardiologist   ROS: Per HPI  Allergies  Allergen Reactions   Codeine Other (See Comments)   Influenza Vaccines    Remeron  [Mirtazapine ] Other (See Comments)    Dizziness    Sulfa Antibiotics Hives   Past Medical History:  Diagnosis Date   Acute cystitis with hematuria 11/22/2020   Atrial fibrillation (HCC)    BCC (basal cell carcinoma of skin) 11/14/2004   Right Mid Cheek(Cx3,5FU)   BCC (basal cell carcinoma of skin) 04/16/2011   Right Crease(tx p bx)   BCC (basal cell carcinoma of skin) 06/16/2016   Left Nare(tx p bx)   BCC (basal cell carcinoma of skin) Atypical Basaloid 09/29/2018   Left Side Nose(tx p bx)   Cerebrovascular accident (CVA) due to occlusion of left middle cerebral artery (HCC) 10/18/2018   Hypertension    Osteoporosis    Pneumonia of lower lobe due to infectious organism 11/20/2020   SCC (squamous cell carcinoma) 09/29/2018   Left Jawline(tx p bx)   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   Right Jawline(Cx3,5FU) and Left Upper Cheek(Cx3,5FU)   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   Left Jawline(tx p bx), Nose(tx p bx), Right Cheek(tx p bx), and Right Forearm(tx p bx)   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   Right Temple(Cx3,5FU) and Left Outer Eye(Cx3,5FU)   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   Right Forearm(Cx3,5FU)   Squamous cell carcinoma in situ (SCCIS) 03/13/2005   Left  Side of Nose(Cx3,5FU)   Squamous cell carcinoma in situ (SCCIS) 06/04/2005   Inner Right Cheek/Eye(Tx p Bx)   Squamous cell carcinoma in situ (SCCIS) 02/12/2010   Right Cheek(tx p bx)   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 2 09/29/2018   Upper Lip(tx p bx) and Bridge of Nose(tx p bx)   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   Left Forehead, Left Neck, Right Nose, and Right Hand   Stroke (HCC)    Tremor    Weakness 11/20/2020    Current Outpatient Medications:    amLODipine  (NORVASC ) 5 MG tablet, TAKE 1 AND 1/2 TABLETS BY MOUTH DAILY, Disp: 135 tablet, Rfl: 0   amoxicillin  (AMOXIL ) 875 MG tablet, Take 1 tablet (875 mg total) by mouth 2 (two) times daily., Disp: 14 tablet, Rfl: 0   aspirin EC 81 MG tablet, Take 81 mg by mouth 3 (three) times a week., Disp: , Rfl:    atorvastatin  (LIPITOR) 40 MG tablet, TAKE 1 TABLET BY MOUTH AT BEDTIME, Disp: 90 tablet, Rfl: 0   busPIRone  (BUSPAR ) 10 MG tablet, TAKE 1 TABLET BY MOUTH AT BEDTIME, Disp: 180 tablet, Rfl: 0   clobetasol  cream (TEMOVATE ) 0.05 %, APPLY TO THE AFFECTED AREA(S) TWICE DAILY UNTIL PSORIATIC LESION RESOLVED, Disp: 120 g, Rfl: 2   doxycycline  (VIBRAMYCIN ) 100 MG capsule, Take 1 capsule (100 mg total) by mouth 2 (two)  times daily., Disp: 14 capsule, Rfl: 0   lisinopril  (ZESTRIL ) 20 MG tablet, TAKE 1 TABLET BY MOUTH ONCE DAILY **DECREASED DOSE**, Disp: 90 tablet, Rfl: 3   neomycin-bacitracin-polymyxin [id number] OINT, Apply 1 Application topically 2 (two) times daily as needed., Disp: 15 g, Rfl: 0   polyethylene glycol (MIRALAX  / GLYCOLAX ) 17 g packet, Take 17 g by mouth daily., Disp: , Rfl:    primidone  (MYSOLINE ) 50 MG tablet, TAKE 1 AND 1/2 TABLETS BY MOUTH THREE TIMES DAILY, Disp: 405 tablet, Rfl: 0   triamcinolone cream (KENALOG) 0.1 %, Apply 1 Application topically., Disp: , Rfl:    warfarin (COUMADIN ) 5 MG tablet, TAKE 1 AND 1/2 TABLETS BY MOUTH EVERY DAY EXCEPT TAKE 1 TABLETS ON TUESDAY, Disp: 135 tablet,  Rfl: 3 Social History   Socioeconomic History   Marital status: Widowed    Spouse name: Not on file   Number of children: 3   Years of education: Not on file   Highest education level: Not on file  Occupational History   Occupation: Retired  Tobacco Use   Smoking status: Never   Smokeless tobacco: Never  Vaping Use   Vaping status: Never Used  Substance and Sexual Activity   Alcohol use: Never   Drug use: Never   Sexual activity: Not Currently  Other Topics Concern   Not on file  Social History Narrative   Not on file   Social Drivers of Health   Financial Resource Strain: Low Risk  (11/21/2020)   Received from [facility]   Overall Financial Resource Strain (CARDIA)    Difficulty of Paying Living Expenses: Not hard at all  Food Insecurity: No Food Insecurity (05/20/2023)   Received from [facility]   Hunger Vital Sign    Within the past 12 months, you worried that your food would run out before you got the money to buy more.: Never true    Within the past 12 months, the food you bought just didn't last and you didn't have money to get more.: Never true  Transportation Needs: No Transportation Needs (05/20/2023)   Received from [facility]   PRAPARE - Transportation    Lack of Transportation (Medical): No    Lack of Transportation (Non-Medical): No  Physical Activity: Not on file  Stress: Not on file  Social Connections: Unknown (05/27/2021)   Received from [facility]   Social Network    Social Network: Not on file  Intimate Partner Violence: Unknown (04/18/2021)   Received from [facility]   HITS    Physically Hurt: Not on file    Insult or Talk Down To: Not on file    Threaten Physical Harm: Not on file    Scream or Curse: Not on file   Family History  Problem Relation Age of Onset   Dementia Mother    Stroke Father    Heart disease Father    Diabetes Sister    Stroke Son     Objective: Office vital signs reviewed. BP 124/78 Comment:  manual  Pulse 86   Temp 97.9 F (36.6 C)   Ht 5' (1.524 m)   Wt 130 lb (59 kg)   SpO2 (!) 89%   BMI 25.39 kg/m .  Physical Examination:  General: Awake, alert, chronically ill appearing elderly female, No acute distress HEENT: Sclera white.  Moist mucous membranes.  Multiple areas of hyperemia along the face Cardio: Irregularly irregular with rate control, S1S2 heard, no murmurs appreciated Pulm: clear  to auscultation bilaterally, no wheezes, rhonchi or rales; normal work of breathing on room air GU: No CVA tenderness palpation.  No suprapubic tenderness palpation. Neuro: Resting tremor present  Assessment/ Plan: 89 y.o. female   Acute cystitis with hematuria - Plan: Urinalysis, Routine w reflex microscopic, Urine Culture, cefTRIAXone  (ROCEPHIN ) injection 1 g, cephALEXin  (KEFLEX ) 500 MG capsule  Longstanding persistent atrial fibrillation (HCC) - Plan: CoaguChek XS/INR Waived  Anticoagulation goal of INR 2 to 3 - Plan: CoaguChek XS/INR Waived  UTI with hematuria.  Rocephin  given intramuscularly.  Start Keflex  3 times daily tomorrow.  INR therapeutic today so no changes.  8-week follow-up scheduled.  We discussed red flag signs and symptoms warranting further evaluation she will follow-up as needed or in 8 weeks   Dominique Farley CHRISTELLA Fielding, DO [facility] [phone]

## 2023-08-17 NOTE — Patient Instructions (Signed)
 Start Cephalexin  every 8 hours tomorrow.  She got a rocephin  shot today. We'll call with culture results once available.

## 2023-08-19 NOTE — Telephone Encounter (Signed)
 BP Readings  8/5 9:00 AM 100/61 81 (Felt Dizzy/Shaky)       10:35  AM  105/65 63 (Was up walking and felt better)              3:30 pm     124/78 86 o2 89 (At PCP Office)              8:00 PM     127/82 63 (No Symptoms)  8/6 9:15 am      124/73 75 (no Symptoms)              12:41 pm     140/84 56 (no Symptoms)         6:45 am       137/71 53 (no symptoms)  8/7 10 :00 AM     98/71 66 (no symptoms)                10:45 AM    100/69 67 (No Symptoms)                  12:10 PM    122/71 60 (No Symptoms)

## 2023-08-20 ENCOUNTER — Ambulatory Visit: Payer: Self-pay | Admitting: Family Medicine

## 2023-08-20 ENCOUNTER — Ambulatory Visit

## 2023-08-20 DIAGNOSIS — N3001 Acute cystitis with hematuria: Secondary | ICD-10-CM

## 2023-08-20 LAB — URINE CULTURE

## 2023-08-20 MED ORDER — CIPROFLOXACIN HCL 500 MG PO TABS: 500.0000 mg | ORAL_TABLET | Freq: Two times a day (BID) | ORAL | 0 refills | Status: AC

## 2023-08-20 NOTE — Telephone Encounter (Signed)
 I spoke with Betty,EC, and relayed Dr.Branch's message and they are very happy !

## 2023-08-21 ENCOUNTER — Observation Stay (HOSPITAL_COMMUNITY)

## 2023-08-21 ENCOUNTER — Emergency Department (HOSPITAL_COMMUNITY)

## 2023-08-21 ENCOUNTER — Observation Stay (HOSPITAL_COMMUNITY)
Admission: EM | Admit: 2023-08-21 | Discharge: 2023-08-23 | Disposition: A | Discharge status: Home Health | Attending: Family Medicine | Admitting: Family Medicine

## 2023-08-21 ENCOUNTER — Other Ambulatory Visit: Payer: Self-pay

## 2023-08-21 DIAGNOSIS — I4891 Unspecified atrial fibrillation: Secondary | ICD-10-CM | POA: Diagnosis not present

## 2023-08-21 DIAGNOSIS — R404 Transient alteration of awareness: Secondary | ICD-10-CM | POA: Diagnosis present

## 2023-08-21 DIAGNOSIS — F32A Depression, unspecified: Secondary | ICD-10-CM | POA: Diagnosis not present

## 2023-08-21 DIAGNOSIS — R531 Weakness: Secondary | ICD-10-CM | POA: Diagnosis not present

## 2023-08-21 DIAGNOSIS — F0390 Unspecified dementia without behavioral disturbance: Secondary | ICD-10-CM | POA: Diagnosis not present

## 2023-08-21 DIAGNOSIS — Z7982 Long term (current) use of aspirin: Secondary | ICD-10-CM | POA: Insufficient documentation

## 2023-08-21 DIAGNOSIS — Z7901 Long term (current) use of anticoagulants: Secondary | ICD-10-CM | POA: Diagnosis not present

## 2023-08-21 DIAGNOSIS — I672 Cerebral atherosclerosis: Secondary | ICD-10-CM | POA: Diagnosis not present

## 2023-08-21 DIAGNOSIS — G9389 Other specified disorders of brain: Secondary | ICD-10-CM | POA: Diagnosis not present

## 2023-08-21 DIAGNOSIS — I10 Essential (primary) hypertension: Secondary | ICD-10-CM | POA: Diagnosis not present

## 2023-08-21 DIAGNOSIS — Z5181 Encounter for therapeutic drug level monitoring: Secondary | ICD-10-CM

## 2023-08-21 DIAGNOSIS — R55 Syncope and collapse: Principal | ICD-10-CM | POA: Insufficient documentation

## 2023-08-21 DIAGNOSIS — R2681 Unsteadiness on feet: Secondary | ICD-10-CM | POA: Insufficient documentation

## 2023-08-21 DIAGNOSIS — G319 Degenerative disease of nervous system, unspecified: Secondary | ICD-10-CM | POA: Diagnosis not present

## 2023-08-21 DIAGNOSIS — I6503 Occlusion and stenosis of bilateral vertebral arteries: Secondary | ICD-10-CM | POA: Diagnosis not present

## 2023-08-21 DIAGNOSIS — R42 Dizziness and giddiness: Secondary | ICD-10-CM | POA: Diagnosis not present

## 2023-08-21 DIAGNOSIS — N3000 Acute cystitis without hematuria: Secondary | ICD-10-CM

## 2023-08-21 DIAGNOSIS — Z79899 Other long term (current) drug therapy: Secondary | ICD-10-CM | POA: Insufficient documentation

## 2023-08-21 DIAGNOSIS — I6381 Other cerebral infarction due to occlusion or stenosis of small artery: Secondary | ICD-10-CM | POA: Diagnosis not present

## 2023-08-21 DIAGNOSIS — N390 Urinary tract infection, site not specified: Secondary | ICD-10-CM | POA: Insufficient documentation

## 2023-08-21 DIAGNOSIS — Z8673 Personal history of transient ischemic attack (TIA), and cerebral infarction without residual deficits: Secondary | ICD-10-CM | POA: Diagnosis not present

## 2023-08-21 DIAGNOSIS — I6782 Cerebral ischemia: Secondary | ICD-10-CM | POA: Diagnosis not present

## 2023-08-21 DIAGNOSIS — Z85828 Personal history of other malignant neoplasm of skin: Secondary | ICD-10-CM | POA: Diagnosis not present

## 2023-08-21 LAB — URINALYSIS, ROUTINE W REFLEX MICROSCOPIC
Bilirubin Urine: NEGATIVE
Glucose, UA: NEGATIVE mg/dL
Hgb urine dipstick: NEGATIVE
Ketones, ur: NEGATIVE mg/dL
Leukocytes,Ua: NEGATIVE
Nitrite: NEGATIVE
Protein, ur: NEGATIVE mg/dL
Specific Gravity, Urine: >1.046 — ABNORMAL HIGH (ref 1.005–1.030)
pH: 6 (ref 5.0–8.0)

## 2023-08-21 LAB — COMPREHENSIVE METABOLIC PANEL WITH GFR
ALT: 24 U/L (ref 0–44)
AST: 29 U/L (ref 15–41)
Albumin: 3.2 g/dL — ABNORMAL LOW (ref 3.5–5.0)
Alkaline Phosphatase: 73 U/L (ref 38–126)
Anion gap: 9 (ref 5–15)
BUN: 22 mg/dL (ref 8–23)
CO2: 27 mmol/L (ref 22–32)
Calcium: 9 mg/dL (ref 8.9–10.3)
Chloride: 103 mmol/L (ref 98–111)
Creatinine, Ser: 0.74 mg/dL (ref 0.44–1.00)
GFR, Estimated: >60 mL/min (ref >60)
Glucose, Bld: 177 mg/dL — ABNORMAL HIGH (ref 70–99)
Potassium: 4.1 mmol/L (ref 3.5–5.1)
Sodium: 139 mmol/L (ref 135–145)
Total Bilirubin: 0.4 mg/dL (ref 0.0–1.2)
Total Protein: 6.5 g/dL (ref 6.5–8.1)

## 2023-08-21 LAB — CBC WITH DIFFERENTIAL/PLATELET
Abs Immature Granulocytes: 0.02 K/uL (ref 0.00–0.07)
Basophils Absolute: 0 K/uL (ref 0.0–0.1)
Basophils Relative: 0 %
Eosinophils Absolute: 0.1 K/uL (ref 0.0–0.5)
Eosinophils Relative: 1 %
HCT: 37.5 % (ref 36.0–46.0)
Hemoglobin: 12.1 g/dL (ref 12.0–15.0)
Immature Granulocytes: 0 %
Lymphocytes Relative: 20 %
Lymphs Abs: 1.4 K/uL (ref 0.7–4.0)
MCH: 32.6 pg (ref 26.0–34.0)
MCHC: 32.3 g/dL (ref 30.0–36.0)
MCV: 101.1 fL — ABNORMAL HIGH (ref 80.0–100.0)
Monocytes Absolute: 0.5 K/uL (ref 0.1–1.0)
Monocytes Relative: 7 %
Neutro Abs: 4.8 K/uL (ref 1.7–7.7)
Neutrophils Relative %: 72 %
Platelets: 229 K/uL (ref 150–400)
RBC: 3.71 MIL/uL — ABNORMAL LOW (ref 3.87–5.11)
RDW: 14.7 % (ref 11.5–15.5)
WBC: 6.7 K/uL (ref 4.0–10.5)
nRBC: 0 % (ref 0.0–0.2)

## 2023-08-21 LAB — PROTIME-INR
INR: 2 — ABNORMAL HIGH (ref 0.8–1.2)
Prothrombin Time: 23.6 s — ABNORMAL HIGH (ref 11.4–15.2)

## 2023-08-21 LAB — TROPONIN I (HIGH SENSITIVITY)
Troponin I (High Sensitivity): 9 ng/L (ref <18)
Troponin I (High Sensitivity): 9 ng/L (ref <18)

## 2023-08-21 LAB — APTT: aPTT: 40 s — ABNORMAL HIGH (ref 24–36)

## 2023-08-21 LAB — D-DIMER, QUANTITATIVE: D-Dimer, Quant: <0.27 ug{FEU}/mL (ref 0.00–0.50)

## 2023-08-21 MED ORDER — ACETAMINOPHEN 160 MG/5ML PO SOLN: 650.0000 mg | ORAL | Status: DC | PRN

## 2023-08-21 MED ORDER — ACETAMINOPHEN 650 MG RE SUPP: 650.0000 mg | RECTAL | Status: DC | PRN

## 2023-08-21 MED ORDER — WARFARIN SODIUM 5 MG PO TABS: 5.0000 mg | ORAL_TABLET | ORAL | Status: DC

## 2023-08-21 MED ORDER — BUSPIRONE HCL 5 MG PO TABS
10.0000 mg | ORAL_TABLET | Freq: Every day | ORAL | Status: DC
  Administered 2023-08-21 – 2023-08-22 (×2): 10 mg via ORAL
  Filled 2023-08-21 (×2): qty 2

## 2023-08-21 MED ORDER — SODIUM CHLORIDE 0.9 % IV SOLN: INTRAVENOUS | Status: DC

## 2023-08-21 MED ORDER — ATORVASTATIN CALCIUM 40 MG PO TABS
40.0000 mg | ORAL_TABLET | Freq: Every day | ORAL | Status: DC
  Administered 2023-08-21: 40 mg via ORAL
  Filled 2023-08-21: qty 1

## 2023-08-21 MED ORDER — SODIUM CHLORIDE 0.9 % IV BOLUS
1000.0000 mL | Freq: Once | INTRAVENOUS | Status: AC
  Administered 2023-08-21: 1000 mL via INTRAVENOUS

## 2023-08-21 MED ORDER — ACETAMINOPHEN 325 MG PO TABS: 650.0000 mg | ORAL_TABLET | ORAL | Status: DC | PRN

## 2023-08-21 MED ORDER — WARFARIN SODIUM 7.5 MG PO TABS
7.5000 mg | ORAL_TABLET | ORAL | Status: DC
Start: 2023-08-21 — End: 2023-08-23
  Administered 2023-08-21 – 2023-08-22 (×2): 7.5 mg via ORAL
  Filled 2023-08-21: qty 1
  Filled 2023-08-21: qty 3

## 2023-08-21 MED ORDER — IOHEXOL 350 MG/ML SOLN
75.0000 mL | Freq: Once | INTRAVENOUS | Status: AC | PRN
  Administered 2023-08-21: 75 mL via INTRAVENOUS

## 2023-08-21 MED ORDER — STROKE: EARLY STAGES OF RECOVERY BOOK
Freq: Once | Status: AC
  Filled 2023-08-21: qty 1

## 2023-08-21 MED ORDER — ASPIRIN 81 MG PO TBEC
81.0000 mg | DELAYED_RELEASE_TABLET | ORAL | Status: DC
Start: 2023-08-23 — End: 2023-08-23
  Administered 2023-08-23 (×2): 81 mg via ORAL
  Filled 2023-08-21: qty 1

## 2023-08-21 MED ORDER — WARFARIN SODIUM 2.5 MG PO TABS: 7.5000 mg | ORAL_TABLET | Freq: Every day | ORAL | Status: DC

## 2023-08-21 MED ORDER — PRIMIDONE 50 MG PO TABS
75.0000 mg | ORAL_TABLET | Freq: Three times a day (TID) | ORAL | Status: DC
  Administered 2023-08-21 – 2023-08-23 (×7): 75 mg via ORAL
  Filled 2023-08-21 (×6): qty 2

## 2023-08-21 MED ORDER — SENNOSIDES-DOCUSATE SODIUM 8.6-50 MG PO TABS: 1.0000 | ORAL_TABLET | Freq: Every evening | ORAL | Status: DC | PRN

## 2023-08-21 MED ORDER — CIPROFLOXACIN HCL 250 MG PO TABS
500.0000 mg | ORAL_TABLET | Freq: Two times a day (BID) | ORAL | Status: DC
  Administered 2023-08-21 – 2023-08-23 (×5): 500 mg via ORAL
  Filled 2023-08-21 (×5): qty 2

## 2023-08-21 MED ORDER — WARFARIN - PHARMACIST DOSING INPATIENT: Freq: Every day | Status: DC

## 2023-08-21 NOTE — ED Notes (Signed)
 Pt getting MRI and then going to [address] hall.

## 2023-08-21 NOTE — ED Notes (Signed)
 Patient transported to MRI via stretcher. Family remains in room.

## 2023-08-21 NOTE — ED Triage Notes (Signed)
 BIBEMS. Family called 911 when patient was seated on a stool having her hair done by family and became unresponsive, went limp. Unspecified duration but patient returned to her baseline prior to EMS arrival. Family reports recent UTI, today is abx day 1. Otherwise well this morning. Patient reports amnesia of the event and several minutes leading up to the event.  Hx of stroke with R sided deficits and a facial droop, tremor, walks with a walker.  Per EMS, orthostatic VSS. CBG 186.

## 2023-08-21 NOTE — H&P (Signed)
 History and Physical    Patient: Dominique Farley FMW:[id number] DOB: 23-Mar-1926 DOA: 08/21/2023 DOS: the patient was seen and examined on 08/21/2023 PCP: Jolinda Norene HERO, DO  Patient coming from: Home  Chief Complaint:  Chief Complaint  Patient presents with   Altered Mental Status   HPI: Dominique Farley is a 89 y.o. female with medical history significant of CVA, a fib on chronic anticoagulation, hypertension, osteoporosis.  Patient was seen at home by her daughter this morning and she in her usual state of health.  Per daughter rapid and she lives with a caretaker when she had a episode of unresponsiveness that lasted for about 5 minutes or so.  She had a blank stare and drooling the caretaker called the daughter who returned.  By that time the patient was starting to respond.  She did have some confusion after the unresponsiveness resolved.  She is currently being treated for UTI and had been on Keflex , but due to the resistance, the patient was switched to ciprofloxacin  and took her first dose this morning.  No fevers, chills, nausea, vomiting.  No tonic-clonic symptoms.  Per patient's daughters, she is back to baseline Review of Systems: As mentioned in the history of present illness. All other systems reviewed and are negative. Past Medical History:  Diagnosis Date   Acute cystitis with hematuria 11/22/2020   Atrial fibrillation (HCC)    BCC (basal cell carcinoma of skin) 11/14/2004   Right Mid Cheek(Cx3,5FU)   BCC (basal cell carcinoma of skin) 04/16/2011   Right Crease(tx p bx)   BCC (basal cell carcinoma of skin) 06/16/2016   Left Nare(tx p bx)   BCC (basal cell carcinoma of skin) Atypical Basaloid 09/29/2018   Left Side Nose(tx p bx)   Cerebrovascular accident (CVA) due to occlusion of left middle cerebral artery (HCC) 10/18/2018   Hypertension    Osteoporosis    Pneumonia of lower lobe due to infectious organism 11/20/2020   SCC (squamous cell carcinoma) 09/29/2018   Left  Jawline(tx p bx)   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   Right Jawline(Cx3,5FU) and Left Upper Cheek(Cx3,5FU)   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   Left Jawline(tx p bx), Nose(tx p bx), Right Cheek(tx p bx), and Right Forearm(tx p bx)   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   Right Temple(Cx3,5FU) and Left Outer Eye(Cx3,5FU)   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   Right Forearm(Cx3,5FU)   Squamous cell carcinoma in situ (SCCIS) 03/13/2005   Left Side of Nose(Cx3,5FU)   Squamous cell carcinoma in situ (SCCIS) 06/04/2005   Inner Right Cheek/Eye(Tx p Bx)   Squamous cell carcinoma in situ (SCCIS) 02/12/2010   Right Cheek(tx p bx)   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 2 09/29/2018   Upper Lip(tx p bx) and Bridge of Nose(tx p bx)   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 She has never used smokeless tobacco. She reports that she does not drink alcohol and does not use drugs.  Allergies  Allergen Reactions   Codeine Other (See Comments)    Unknown    Influenza Vaccines Other (See Comments)  Unknown    Remeron  [Mirtazapine ] Other (See Comments)    Dizziness    Sulfa Antibiotics Hives    Family History  Problem Relation Age of Onset   Dementia Mother    Stroke Father    Heart disease Father    Diabetes Sister    Stroke Son     Prior to Admission medications   Medication Sig Start Date End Date Taking? Authorizing Provider  amLODipine  (NORVASC ) 5 MG tablet TAKE 1 AND 1/2 TABLETS BY MOUTH DAILY 05/17/23   Jolinda Potter M, DO  aspirin  EC 81 MG tablet Take 81 mg by mouth 3 (three) times a week.    [provider]  atorvastatin  (LIPITOR) 40 MG tablet  TAKE 1 TABLET BY MOUTH AT BEDTIME 05/17/23   Jolinda Potter M, DO  busPIRone  (BUSPAR ) 10 MG tablet TAKE 1 TABLET BY MOUTH AT BEDTIME 06/15/23   Jolinda Potter M, DO  cephALEXin  (KEFLEX ) 500 MG capsule Take 1 capsule (500 mg total) by mouth 3 (three) times daily for 7 days. 08/17/23 08/24/23  Jolinda Potter HERO, DO  ciprofloxacin  (CIPRO ) 500 MG tablet Take 1 tablet (500 mg total) by mouth 2 (two) times daily for 3 days. 08/20/23 08/23/23  Jolinda Potter HERO, DO  clobetasol  cream (TEMOVATE ) 0.05 % APPLY TO THE AFFECTED AREA(S) TWICE DAILY UNTIL PSORIATIC LESION RESOLVED 08/19/22   Jolinda Potter HERO, DO  neomycin-bacitracin-polymyxin [phone] OINT Apply 1 Application topically 2 (two) times daily as needed. 07/22/23   [facility] Pool, NP  polyethylene glycol (MIRALAX  / GLYCOLAX ) 17 g packet Take 17 g by mouth daily.    [provider]  primidone  (MYSOLINE ) 50 MG tablet TAKE 1 AND 1/2 TABLETS BY MOUTH THREE TIMES DAILY 07/26/23   Jolinda Potter M, DO  tacrolimus (PROTOPIC) 0.1 % ointment Apply 1 Application topically 2 (two) times daily as needed (irritation). Apply to red, irritated areas on entire body. 05/13/23   [provider]  triamcinolone cream (KENALOG) 0.1 % Apply 1 Application topically.    [provider]  warfarin (COUMADIN ) 5 MG tablet TAKE 1 AND 1/2 TABLETS BY MOUTH EVERY DAY EXCEPT TAKE 1 TABLETS ON TUESDAY 03/24/23   Jolinda Potter HERO, DO    Physical Exam: Vitals:   08/21/23 1230 08/21/23 1500 08/21/23 1539 08/21/23 1641  BP: 123/60 (!) 142/71  (!) 155/84  Pulse: 63 64    Resp: 19 (!) 21  19  Temp:   98.2 F (36.8 C)   TempSrc:   Oral   SpO2: 94% 94%    Weight:      Height:       General: Elderly female. Awake and alert and oriented x3. No acute cardiopulmonary distress.  HEENT: Normocephalic atraumatic.  Right and left ears normal in appearance.  Pupils equal, round, reactive to light. Extraocular muscles are intact. Sclerae anicteric  and noninjected.  Moist mucosal membranes. No mucosal lesions.  Neck: Neck supple without lymphadenopathy. No carotid bruits. No masses palpated.  Cardiovascular: Regular rate with normal S1-S2 sounds. No murmurs, rubs, gallops auscultated. No JVD.  Respiratory: Good respiratory effort with no wheezes, rales, rhonchi. Lungs clear to auscultation bilaterally.  No accessory muscle use. Abdomen: Soft, nontender, nondistended. Active bowel sounds. No masses or hepatosplenomegaly  Skin: No rashes, lesions, or ulcerations.  Dry, warm to touch. 2+ dorsalis pedis and radial pulses. Musculoskeletal: No calf or leg pain. All major joints not erythematous nontender.  No upper or lower joint deformation.  Good ROM.  No contractures  Psychiatric: Intact judgment and insight. Pleasant and cooperative. Neurologic: No focal neurological deficits. Strength is 5/5 and symmetric in upper and lower extremities.  Cranial nerves II through XII are grossly intact.   Data Reviewed: Labs and imaging reviewed by me  Assessment and Plan: No notes have been filed under this hospital service. Service: Hospitalist  Principal Problem:   Unresponsive episode Active Problems:   Essential hypertension   Atrial fibrillation (HCC)   Anticoagulation goal of INR 2 to 3   History of CVA in adulthood   UTI (urinary tract infection)  Unresponsive episode Rule out TIA/stroke Possibly new onset seizure.  Less likely a side effect from ciprofloxacin , as this is her first dose today and took this within an hour ago her symptoms. Observation on telemetry MRI/MRA head Carotid Dopplers  Echocardiogram tomorrow Hemoglobin A1c, lipid panel in the morning PT/OT/speech therapy consult EEG as outpatient if continues to be resolved History of CVA UTI Continue ciprofloxacin  Atrial fibrillation on Coumadin  Continue Coumadin  Hypertension    Advance Care Planning:   Code Status: Limited: Do not attempt resuscitation (DNR)  -DNR-LIMITED -Do Not Intubate/DNI confirmed by patient  Consults: Neuro  Family Communication: Daughter is present during interview and exam  Severity of Illness: The appropriate patient status for this patient is OBSERVATION. Observation status is judged to be reasonable and necessary in order to provide the required intensity of service to ensure the patient's safety. The patient's presenting symptoms, physical exam findings, and initial radiographic and laboratory data in the context of their medical condition is felt to place them at decreased risk for further clinical deterioration. Furthermore, it is anticipated that the patient will be medically stable for discharge from the hospital within 2 midnights of admission.   Author: Arrabella Westerman J Avni Traore, DO 08/21/2023 5:06 PM  For on call review [email or web address].

## 2023-08-21 NOTE — ED Notes (Signed)
 Pt assisted to bathroom. Hat placed in toilet. Pt unable to provide urine sample. BM only.

## 2023-08-21 NOTE — Progress Notes (Signed)
 PHARMACY - ANTICOAGULATION CONSULT NOTE  Pharmacy Consult for Warfarin  Indication: atrial fibrillation  Allergies  Allergen Reactions   Codeine Other (See Comments)    Unknown    Influenza Vaccines Other (See Comments)    Unknown    Remeron  [Mirtazapine ] Other (See Comments)    Dizziness    Sulfa Antibiotics Hives    Patient Measurements: Height: 5' 3 (160 cm) Weight: 54.4 kg (120 lb) IBW/kg (Calculated) : 52.4 HEPARIN DW (KG): 54.4  Vital Signs: Temp: 97.6 F (36.4 C) (08/09 1721) Temp Source: Oral (08/09 1721) BP: 160/82 (08/09 1730) Pulse Rate: 80 (08/09 1730)  Labs: Recent Labs    08/21/23 1209 08/21/23 1607  HGB 12.1  --   HCT 37.5  --   PLT 229  --   APTT 40*  --   LABPROT 23.6*  --   INR 2.0*  --   CREATININE 0.74  --   TROPONINIHS 9 9    Estimated Creatinine Clearance: 35.6 mL/min (by C-G formula based on SCr of 0.74 mg/dL).   Medical History: Past Medical History:  Diagnosis Date   Acute cystitis with hematuria 11/22/2020   Atrial fibrillation (HCC)    BCC (basal cell carcinoma of skin) 11/14/2004   Right Mid Cheek(Cx3,5FU)   BCC (basal cell carcinoma of skin) 04/16/2011   Right Crease(tx p bx)   BCC (basal cell carcinoma of skin) 06/16/2016   Left Nare(tx p bx)   BCC (basal cell carcinoma of skin) Atypical Basaloid 09/29/2018   Left Side Nose(tx p bx)   Cerebrovascular accident (CVA) due to occlusion of left middle cerebral artery (HCC) 10/18/2018   Hypertension    Osteoporosis    Pneumonia of lower lobe due to infectious organism 11/20/2020   SCC (squamous cell carcinoma) 09/29/2018   Left Jawline(tx p bx)   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   Right Jawline(Cx3,5FU) and Left Upper Cheek(Cx3,5FU)   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   Left Jawline(tx p bx), Nose(tx p bx), Right Cheek(tx p bx), and Right Forearm(tx p bx)   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   Right Temple(Cx3,5FU) and Left Outer  Eye(Cx3,5FU)   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   Right Forearm(Cx3,5FU)   Squamous cell carcinoma in situ (SCCIS) 03/13/2005   Left Side of Nose(Cx3,5FU)   Squamous cell carcinoma in situ (SCCIS) 06/04/2005   Inner Right Cheek/Eye(Tx p Bx)   Squamous cell carcinoma in situ (SCCIS) 02/12/2010   Right Cheek(tx p bx)   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 2 09/29/2018   Upper Lip(tx p bx) and Bridge of Nose(tx p bx)   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 Patient has a history of afib and takes warfarin. Per med history she takes 7.5 mg daily except 5mg  on Tuesdays. Most recent outpatient MD note states patient is compliant with warfarin and INR within range, dose not included in note. Refill history indicates 5mg  tablets #30 every 30 days so dosage that patient states may not be accurate.  Goal of Therapy:  INR 2-3 Monitor platelets by anticoagulation protocol: Yes   Plan:  Will continue with patient home dose of 7.5mg  daily and 5mg  on Tuesday for now and follow INR  trend. Daily INR checks.  Olam Fritter, PharmD, BCPS 08/21/2023 6:02 PM

## 2023-08-21 NOTE — ED Provider Notes (Signed)
 [place] EMERGENCY DEPARTMENT AT [facility] Provider Note   CSN: [id number] Arrival date & time: 08/21/23  1122     Patient presents with: Altered Mental Status   Dominique Farley is a 89 y.o. female with history of A-fib on warfarin, stroke, dementia, hypertension, hyperlipidemia, basal cell and squamous cell carcinoma presents following syncopal episode approximately an hour ago today.  Patient was reportedly eating when suddenly her caregiver noticed that she appeared to pass out.  Reportedly her gaze was fixed and she was drooling.  Patient states she felt like she was about to pass out without any preceding chest pain, shortness of breath, headache, extremity weakness, numbness, vision changes.  She is currently being treated for a UTI on Cipro .  She denies any abdominal pain, nausea or vomiting.  Her daughter at bedside reports that over the past 2 weeks she seems to be weaker in the mornings and has been complaining of dizziness described as near syncope.  Her cardiologist has decreased her dose of lisinopril .    Altered Mental Status  Past Medical History:  Diagnosis Date   Acute cystitis with hematuria 11/22/2020   Atrial fibrillation (HCC)    BCC (basal cell carcinoma of skin) 11/14/2004   Right Mid Cheek(Cx3,5FU)   BCC (basal cell carcinoma of skin) 04/16/2011   Right Crease(tx p bx)   BCC (basal cell carcinoma of skin) 06/16/2016   Left Nare(tx p bx)   BCC (basal cell carcinoma of skin) Atypical Basaloid 09/29/2018   Left Side Nose(tx p bx)   Cerebrovascular accident (CVA) due to occlusion of left middle cerebral artery (HCC) 10/18/2018   Hypertension    Osteoporosis    Pneumonia of lower lobe due to infectious organism 11/20/2020   SCC (squamous cell carcinoma) 09/29/2018   Left Jawline(tx p bx)   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   Right Jawline(Cx3,5FU) and Left Upper Cheek(Cx3,5FU)   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   Left Jawline(tx  p bx), Nose(tx p bx), Right Cheek(tx p bx), and Right Forearm(tx p bx)   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   Right Temple(Cx3,5FU) and Left Outer Eye(Cx3,5FU)   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   Right Forearm(Cx3,5FU)   Squamous cell carcinoma in situ (SCCIS) 03/13/2005   Left Side of Nose(Cx3,5FU)   Squamous cell carcinoma in situ (SCCIS) 06/04/2005   Inner Right Cheek/Eye(Tx p Bx)   Squamous cell carcinoma in situ (SCCIS) 02/12/2010   Right Cheek(tx p bx)   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 2 09/29/2018   Upper Lip(tx p bx) and Bridge of Nose(tx p bx)   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   Left Forehead, Left Neck, Right Nose, and Right Hand   Stroke [facility])    Tremor    Weakness 11/20/2020   Past Surgical History:  Procedure Laterality Date   CHOLECYSTECTOMY     skin biopsies         Prior to Admission medications   Medication Sig Start Date End Date Taking? Authorizing Provider  amLODipine  (NORVASC ) 5 MG tablet TAKE 1 AND 1/2 TABLETS BY MOUTH DAILY 05/17/23   Jolinda Potter M, DO  aspirin  EC 81 MG tablet Take 81 mg by mouth 3 (three) times a week.    [provider]  atorvastatin  (LIPITOR) 40 MG tablet TAKE 1 TABLET BY MOUTH AT BEDTIME 05/17/23   Jolinda Potter M, DO  busPIRone  (BUSPAR ) 10 MG  tablet TAKE 1 TABLET BY MOUTH AT BEDTIME 06/15/23   Jolinda Potter M, DO  cephALEXin  (KEFLEX ) 500 MG capsule Take 1 capsule (500 mg total) by mouth 3 (three) times daily for 7 days. 08/17/23 08/24/23  Jolinda Potter HERO, DO  ciprofloxacin  (CIPRO ) 500 MG tablet Take 1 tablet (500 mg total) by mouth 2 (two) times daily for 3 days. 08/20/23 08/23/23  Jolinda Potter HERO, DO  clobetasol  cream (TEMOVATE ) 0.05 % APPLY TO THE AFFECTED AREA(S) TWICE DAILY UNTIL PSORIATIC LESION RESOLVED 08/19/22   Jolinda Potter HERO, DO  neomycin-bacitracin-polymyxin [phone] OINT Apply 1 Application topically 2 (two) times daily as  needed. 07/22/23   [facility] Pool, NP  polyethylene glycol (MIRALAX  / GLYCOLAX ) 17 g packet Take 17 g by mouth daily.    [provider]  primidone  (MYSOLINE ) 50 MG tablet TAKE 1 AND 1/2 TABLETS BY MOUTH THREE TIMES DAILY 07/26/23   Jolinda Potter M, DO  tacrolimus (PROTOPIC) 0.1 % ointment Apply 1 Application topically 2 (two) times daily as needed (irritation). Apply to red, irritated areas on entire body. 05/13/23   [provider]  triamcinolone cream (KENALOG) 0.1 % Apply 1 Application topically.    [provider]  warfarin (COUMADIN ) 5 MG tablet TAKE 1 AND 1/2 TABLETS BY MOUTH EVERY DAY EXCEPT TAKE 1 TABLETS ON TUESDAY 03/24/23   Jolinda Potter M, DO    Allergies: Codeine, Influenza vaccines, Remeron  [mirtazapine ], and Sulfa antibiotics    Review of Systems  Updated Vital Signs BP (!) 155/84   Pulse 64   Temp 98.2 F (36.8 C) (Oral)   Resp 19   Ht 5' 3 (1.6 m)   Wt 54.4 kg   SpO2 94%   BMI 21.26 kg/m   Physical Exam Vitals and nursing note reviewed.  Constitutional:      General: She is not in acute distress.    Appearance: She is well-developed.  HENT:     Head: Normocephalic and atraumatic.  Eyes:     Conjunctiva/sclera: Conjunctivae normal.  Cardiovascular:     Rate and Rhythm: Normal rate and regular rhythm.     Heart sounds: No murmur heard. Pulmonary:     Effort: Pulmonary effort is normal. No respiratory distress.     Breath sounds: Normal breath sounds.  Abdominal:     Palpations: Abdomen is soft.     Tenderness: There is no abdominal tenderness.  Musculoskeletal:        General: No swelling.     Cervical back: Neck supple.  Skin:    General: Skin is warm and dry.     Capillary Refill: Capillary refill takes less than 2 seconds.  Neurological:     Mental Status: She is alert.     Comments: Patient is alert and oriented. There is no abnormal phonation. Symmetric smile without facial droop. No pronator drift. Moves  all extremities spontaneously. 5/5 strength in upper and lower extremities. . No sensation deficit. There is no nystagmus. EOMI, PERRL. Coordination intact with finger to nose and normal ambulation.    Psychiatric:        Mood and Affect: Mood normal.     (all labs ordered are listed, but only abnormal results are displayed) Labs Reviewed  CBC WITH DIFFERENTIAL/PLATELET - Abnormal; Notable for the following components:      Result Value   RBC 3.71 (*)    MCV 101.1 (*)    All other components within normal limits  COMPREHENSIVE METABOLIC PANEL WITH GFR -  Abnormal; Notable for the following components:   Glucose, Bld 177 (*)    Albumin 3.2 (*)    All other components within normal limits  URINALYSIS, ROUTINE W REFLEX MICROSCOPIC - Abnormal; Notable for the following components:   Specific Gravity, Urine >1.046 (*)    All other components within normal limits  PROTIME-INR - Abnormal; Notable for the following components:   Prothrombin Time 23.6 (*)    INR 2.0 (*)    All other components within normal limits  APTT - Abnormal; Notable for the following components:   aPTT 40 (*)    All other components within normal limits  D-DIMER, QUANTITATIVE  TROPONIN I (HIGH SENSITIVITY)  TROPONIN I (HIGH SENSITIVITY)    EKG: None  Radiology: CT ANGIO HEAD NECK W WO CM Result Date: 08/21/2023 EXAM: CTA Head and Neck with Intravenous Contrast. CT Head without Contrast. CLINICAL HISTORY: Transient ischemic attack (TIA), remote infarct, and history of stroke. Episode of unresponsiveness, went limp. Returned to baseline. TECHNIQUE: Axial CTA images of the head and neck performed with intravenous contrast. MIP reconstructed images were created and reviewed. Axial computed tomography images of the head/brain performed without intravenous contrast. Note: Per PQRS, the description of internal carotid artery percent stenosis, including 0 percent or normal exam, is based on [place] American Symptomatic  Carotid Endarterectomy Trial (NASCET) criteria. Dose reduction technique was used including one or more of the following: automated exposure control, adjustment of mA and kV according to patient size, and/or iterative reconstruction. CONTRAST: Without and with; 75mL (iohexol  (OMNIPAQUE ) 350 MG/ML injection 75 mL IOHEXOL  350 MG/ML SOLN) COMPARISON: None provided. FINDINGS: CT HEAD: BRAIN: Remote infarct in the left coronaradiata and basal ganglia. Ex vacuo dilatation of the left lateral ventricle. Nonspecific hypoattenuation in the periventricular and subcortical white matter, most likely representing chronic small vessel disease. Mild parenchymal volume loss. No acute intraparenchymal hemorrhage. No mass lesion. No CT evidence for acute territorial infarct. No midline shift or extra-axial collection. VENTRICLES: Ex vacuo dilatation of the left lateral ventricle. ORBITS: Right lens replacement. SINUSES AND MASTOIDS: The paranasal sinuses and mastoid air cells are clear. CTA NECK: COMMON CAROTID ARTERIES: No significant stenosis. No dissection or occlusion. INTERNAL CAROTID ARTERIES: Tortulosity of the proximal right common carotid artery resulting in mild stenosis. Mild atherosclerosis at the right carotid bifurcation without hemodynamically significant stenosis. Tortuosity of the mid right cervical ICA. Mild atherosclerosis at the left carotid bifurcation without hemodynamically significant stenosis. Tortuosity of the proximal and mid left cervical ICA. There is mild irregularity of the distal left cervical ICA with areas of beaded appearance which may reflect fibromuscular dysplasia versus vasospasm. VERTEBRAL ARTERIES: Atherosclerosis at the right vertebral artery origin resulting in mild stenosis. Atherosclerosis of the left vertebral artery origin resulting in mild stenosis. Vertebral arteries are patent to the vertebrobasilar confluence. CTA HEAD: ANTERIOR CEREBRAL ARTERIES: No significant stenosis. No  occlusion. No aneurysm. MIDDLE CEREBRAL ARTERIES: Slightly diminished appearance of left MCA branches in the anterior left MCA territory likely related to prior infarct. The MCAs are patent bilaterally. POSTERIOR CEREBRAL ARTERIES: No significant stenosis. No occlusion. No aneurysm. BASILAR ARTERY: No significant stenosis. No occlusion. No aneurysm. OTHER: Mild atherosclerosis of the carotid siphons without significant stenosis. The intracranial internal carotid arteries are patent bilaterally. The ACAs are patent bilaterally. SOFT TISSUES: No acute finding. No masses or lymphadenopathy. BONES: Degenerative changes in the visualized spine. IMPRESSION: 1. No acute intracranial abnormality. 2. No large vessel occlusion, high-grade stenosis, or dissection of the arteries in the head  and neck. 3. Remote infarct in the left corona radiata and basal ganglia. 4. Mild irregularity of the distal left cervical ICA with areas of beaded appearance, possibly reflecting fibromuscular dysplasia versus vasospasm. Electronically signed by: Donnice Mania MD 08/21/2023 02:43 PM EDT RP Workstation: [id number]     Procedures   Medications Ordered in the ED  iohexol  (OMNIPAQUE ) 350 MG/ML injection 75 mL (75 mLs Intravenous Contrast Given 08/21/23 1305)  sodium chloride  0.9 % bolus 1,000 mL (1,000 mLs Intravenous New Bag/Given 08/21/23 1610)                                    Medical Decision Making Amount and/or Complexity of Data Reviewed Labs: ordered. Radiology: ordered.  Risk Prescription drug management.   This patient presents to the ED with chief complaint(s) of syncope.  The complaint involves an extensive differential diagnosis and also carries with it a high risk of complications and morbidity.   Pertinent past medical history as listed in HPI  The differential diagnosis includes  TIA, CVA, seizure, arrhythmia, ACS, PE, hypoglycemia, vasovagal Additional history obtained: Additional history obtained from  family Records reviewed Care Everywhere/External Records  Assessment and management:   Hemodynamically stable, nontoxic-appearing patient presenting following reported syncopal episode that happened approximately an hour ago.  Patient appears to be at her baseline currently.  She has no neurodeficits on her exam at this time.  She speaks without difficulty and appears to be alert and oriented.  Lower suspicion for seizure as she has no history of seizures, denies urinary incontinence and has no lateral tongue Lacs on her exam although she was reported to be somewhat confused following the episode.  She does have a history of CVA and does have A-fib.  This very well could have been a TIA given this history.  Will obtain brain imaging.  She is currently being treated for UTI but the antibiotic was reportedly changed without significant improvement.  Will obtain infectious workup.  Will obtain EKG and troponin to evaluate for any cardiac etiology although she denies any chest pain or shortness of breath at this time.  Although she is not tachycardic or tachypneic she does have a strong history of skin cancer.  She has no lower extremity edema but will add on a dimer to evaluate for PE.   Independent ECG interpretation:  A-fib, left bundle, appears unchanged  Independent labs interpretation:  The following labs were independently interpreted:  To see without significant abnormality,  Independent visualization and interpretation of imaging: I independently visualized the following imaging with scope of interpretation limited to determining acute life threatening conditions related to emergency care:  CT angio head and neck no acute intracranial abnormality, no large vessel occlusion, high-grade stenosis or dissection  Consultations obtained:   Neurology Dr. Cleave felt that this could be consistent with hypoperfusion in the setting of intermittent hypotension, symptomatic advanced Lewy body dementia, or  a seizure.  Recommended MRI wo contrast to evaluate for TIA, she will need an EEG as well. If she is ready to go tomorrow EEG can be done outpatient neuro follow up. If she is still here on Monday she can have EEG done and evaluated by neuro Hospitalist Dr. Barbra agreed for admission   Disposition:   Patient will be admitted for further workup and management  Social Determinants of Health:   none  This note was dictated with voice recognition software.  Despite best efforts at proofreading, errors may have occurred which can change the documentation meaning.       Final diagnoses:  Syncope, unspecified syncope type    ED Discharge Orders     None          Donnajean Lynwood VEAR DEVONNA 08/21/23 1710    Suzette Pac, MD 08/21/23 1712

## 2023-08-22 ENCOUNTER — Observation Stay (HOSPITAL_BASED_OUTPATIENT_CLINIC_OR_DEPARTMENT_OTHER)

## 2023-08-22 DIAGNOSIS — I9589 Other hypotension: Secondary | ICD-10-CM

## 2023-08-22 DIAGNOSIS — R404 Transient alteration of awareness: Secondary | ICD-10-CM | POA: Diagnosis not present

## 2023-08-22 LAB — ECHOCARDIOGRAM COMPLETE
Height: 63 in
MV M vel: 5.52 m/s
MV Peak grad: 121.9 mmHg
P 1/2 time: 426 ms
S' Lateral: 3.1 cm
Weight: 1920 [oz_av]

## 2023-08-22 LAB — LIPID PANEL
Cholesterol: 116 mg/dL (ref 0–200)
HDL: 59 mg/dL (ref >40)
LDL Cholesterol: 51 mg/dL (ref 0–99)
Total CHOL/HDL Ratio: 2 ratio
Triglycerides: 32 mg/dL (ref <150)
VLDL: 6 mg/dL (ref 0–40)

## 2023-08-22 LAB — PROTIME-INR
INR: 2 — ABNORMAL HIGH (ref 0.8–1.2)
Prothrombin Time: 23.7 s — ABNORMAL HIGH (ref 11.4–15.2)

## 2023-08-22 MED ORDER — SODIUM CHLORIDE 0.9 % IV SOLN: INTRAVENOUS | Status: DC

## 2023-08-22 MED ORDER — SODIUM CHLORIDE 0.9 % IV SOLN: INTRAVENOUS | Status: AC

## 2023-08-22 MED ORDER — ATORVASTATIN CALCIUM 40 MG PO TABS: 40.0000 mg | ORAL_TABLET | Freq: Every day | ORAL | Status: DC

## 2023-08-22 NOTE — Progress Notes (Signed)
 PHARMACY - ANTICOAGULATION CONSULT NOTE  Pharmacy Consult for Warfarin  Indication: atrial fibrillation  Allergies  Allergen Reactions   Codeine Other (See Comments)    Unknown    Influenza Vaccines Other (See Comments)    Unknown    Remeron  [Mirtazapine ] Other (See Comments)    Dizziness    Sulfa Antibiotics Hives    Patient Measurements: Height: 5' 3 (160 cm) Weight: 54.4 kg (120 lb) IBW/kg (Calculated) : 52.4 HEPARIN DW (KG): 54.4  Vital Signs: Temp: 97.6 F (36.4 C) (08/10 0614) Temp Source: Oral (08/10 0614) BP: 147/84 (08/10 0614) Pulse Rate: 72 (08/10 0614)  Labs: Recent Labs    08/21/23 1209 08/21/23 1607 08/22/23 0345  HGB 12.1  --   --   HCT 37.5  --   --   PLT 229  --   --   APTT 40*  --   --   LABPROT 23.6*  --  23.7*  INR 2.0*  --  2.0*  CREATININE 0.74  --   --   TROPONINIHS 9 9  --     Estimated Creatinine Clearance: 35.6 mL/min (by C-G formula based on SCr of 0.74 mg/dL).   Medical History: Past Medical History:  Diagnosis Date   Acute cystitis with hematuria 11/22/2020   Atrial fibrillation (HCC)    BCC (basal cell carcinoma of skin) 11/14/2004   Right Mid Cheek(Cx3,5FU)   BCC (basal cell carcinoma of skin) 04/16/2011   Right Crease(tx p bx)   BCC (basal cell carcinoma of skin) 06/16/2016   Left Nare(tx p bx)   BCC (basal cell carcinoma of skin) Atypical Basaloid 09/29/2018   Left Side Nose(tx p bx)   Cerebrovascular accident (CVA) due to occlusion of left middle cerebral artery (HCC) 10/18/2018   Hypertension    Osteoporosis    Pneumonia of lower lobe due to infectious organism 11/20/2020   SCC (squamous cell carcinoma) 09/29/2018   Left Jawline(tx p bx)   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   Right Jawline(Cx3,5FU) and Left Upper Cheek(Cx3,5FU)   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   Left Jawline(tx p bx), Nose(tx p bx), Right Cheek(tx p bx), and Right Forearm(tx p bx)   SCC (squamous cell carcinoma)-Well  Diff x 2 05/16/2004   Right Temple(Cx3,5FU) and Left Outer Eye(Cx3,5FU)   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   Right Forearm(Cx3,5FU)   Squamous cell carcinoma in situ (SCCIS) 03/13/2005   Left Side of Nose(Cx3,5FU)   Squamous cell carcinoma in situ (SCCIS) 06/04/2005   Inner Right Cheek/Eye(Tx p Bx)   Squamous cell carcinoma in situ (SCCIS) 02/12/2010   Right Cheek(tx p bx)   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 2 09/29/2018   Upper Lip(tx p bx) and Bridge of Nose(tx p bx)   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   Left Forehead, Left Neck, Right Nose, and Right Hand   Stroke [facility])    Tremor    Weakness 11/20/2020   Assessment: Patient is a 89yo female that presented after an unresponsive episode. Patient has a history of afib and takes warfarin. Per med history she takes 7.5 mg daily except 5mg  on Tuesdays. Most recent outpatient MD note states patient is compliant with warfarin and INR within range, dose not included in note.  Patient on primidone  at home as well.  INR 2 > 2   Goal of Therapy:  INR 2-3 Monitor platelets by anticoagulation protocol: Yes   Plan:  Continue home dose of 7.5mg  daily and 5mg  on Tuesday  Daily INR checks.  Jacarie Pate, BS Pharm D, BCPS Clinical Pharmacist 08/22/2023 8:59 AM

## 2023-08-22 NOTE — Progress Notes (Signed)
  Echocardiogram 2D Echocardiogram has been performed.  Koleen KANDICE Popper, RDCS 08/22/2023, 9:03 AM

## 2023-08-22 NOTE — Plan of Care (Signed)

## 2023-08-22 NOTE — Plan of Care (Signed)
  Problem: Education: Goal: Knowledge of General Education information will improve Description: Including pain rating scale, medication(s)/side effects and non-pharmacologic comfort measures Outcome: Progressing   Problem: Health Behavior/Discharge Planning: Goal: Ability to manage health-related needs will improve Outcome: Progressing   Problem: Clinical Measurements: Goal: Ability to maintain clinical measurements within normal limits will improve Outcome: Progressing Goal: Will remain free from infection Outcome: Progressing Goal: Diagnostic test results will improve Outcome: Progressing Goal: Respiratory complications will improve Outcome: Progressing Goal: Cardiovascular complication will be avoided Outcome: Progressing   Problem: Activity: Goal: Risk for activity intolerance will decrease Outcome: Progressing   Problem: Nutrition: Goal: Adequate nutrition will be maintained Outcome: Progressing   Problem: Coping: Goal: Level of anxiety will decrease Outcome: Progressing   Problem: Elimination: Goal: Will not experience complications related to bowel motility Outcome: Progressing Goal: Will not experience complications related to urinary retention Outcome: Progressing   Problem: Pain Managment: Goal: General experience of comfort will improve and/or be controlled Outcome: Progressing   Problem: Safety: Goal: Ability to remain free from injury will improve Outcome: Progressing   Problem: Skin Integrity: Goal: Risk for impaired skin integrity will decrease Outcome: Progressing   Problem: Education: Goal: Knowledge of disease or condition will improve Outcome: Progressing Goal: Knowledge of secondary prevention will improve (MUST DOCUMENT ALL) Outcome: Progressing Goal: Knowledge of patient specific risk factors will improve (DELETE if not current risk factor) Outcome: Progressing   Problem: Ischemic Stroke/TIA Tissue Perfusion: Goal: Complications of  ischemic stroke/TIA will be minimized Outcome: Progressing   Problem: Coping: Goal: Will verbalize positive feelings about self Outcome: Progressing Goal: Will identify appropriate support needs Outcome: Progressing   Problem: Health Behavior/Discharge Planning: Goal: Ability to manage health-related needs will improve Outcome: Progressing Goal: Goals will be collaboratively established with patient/family Outcome: Progressing   Problem: Self-Care: Goal: Ability to participate in self-care as condition permits will improve Outcome: Progressing Goal: Verbalization of feelings and concerns over difficulty with self-care will improve Outcome: Progressing Goal: Ability to communicate needs accurately will improve Outcome: Progressing   Problem: Nutrition: Goal: Risk of aspiration will decrease Outcome: Progressing Goal: Dietary intake will improve Outcome: Progressing   Problem: Education: Goal: Knowledge of disease or condition will improve Outcome: Progressing Goal: Knowledge of secondary prevention will improve (MUST DOCUMENT ALL) Outcome: Progressing Goal: Knowledge of patient specific risk factors will improve (DELETE if not current risk factor) Outcome: Progressing   Problem: Ischemic Stroke/TIA Tissue Perfusion: Goal: Complications of ischemic stroke/TIA will be minimized Outcome: Progressing   Problem: Coping: Goal: Will verbalize positive feelings about self Outcome: Progressing Goal: Will identify appropriate support needs Outcome: Progressing   Problem: Health Behavior/Discharge Planning: Goal: Ability to manage health-related needs will improve Outcome: Progressing Goal: Goals will be collaboratively established with patient/family Outcome: Progressing   Problem: Self-Care: Goal: Ability to participate in self-care as condition permits will improve Outcome: Progressing Goal: Verbalization of feelings and concerns over difficulty with self-care will  improve Outcome: Progressing Goal: Ability to communicate needs accurately will improve Outcome: Progressing   Problem: Nutrition: Goal: Risk of aspiration will decrease Outcome: Progressing Goal: Dietary intake will improve Outcome: Progressing

## 2023-08-22 NOTE — Progress Notes (Signed)
 Patient slept well during the night with no complaints. Pt had no further passing out or unconscious episodes to be noted this shift. NIH assessments continue to be performed Q4H and all have had a score of 0. Pt states that she feels better and okay upon question this am. Denies any pain or any type of discomfort. Pts housekeeper is at bedside and pts daughters should be back to visit today. IV fluids continue as ordered. No complication swallowing or taking medication. Pt has been alert, verbal oriented x4. Resting in bed with call light within reach and bed alarm on with bed in low position.

## 2023-08-22 NOTE — Plan of Care (Signed)
  Problem: Education: Goal: Knowledge of General Education information will improve Description: Including pain rating scale, medication(s)/side effects and non-pharmacologic comfort measures Outcome: Progressing   Problem: Health Behavior/Discharge Planning: Goal: Ability to manage health-related needs will improve Outcome: Progressing   Problem: Clinical Measurements: Goal: Ability to maintain clinical measurements within normal limits will improve Outcome: Progressing Goal: Will remain free from infection Outcome: Progressing Goal: Diagnostic test results will improve Outcome: Progressing Goal: Respiratory complications will improve Outcome: Progressing Goal: Cardiovascular complication will be avoided Outcome: Progressing   Problem: Activity: Goal: Risk for activity intolerance will decrease Outcome: Progressing   Problem: Nutrition: Goal: Adequate nutrition will be maintained Outcome: Progressing   Problem: Coping: Goal: Level of anxiety will decrease Outcome: Progressing   Problem: Elimination: Goal: Will not experience complications related to bowel motility Outcome: Progressing Goal: Will not experience complications related to urinary retention Outcome: Progressing   Problem: Pain Managment: Goal: General experience of comfort will improve and/or be controlled Outcome: Progressing   Problem: Safety: Goal: Ability to remain free from injury will improve Outcome: Progressing   Problem: Skin Integrity: Goal: Risk for impaired skin integrity will decrease Outcome: Progressing   Problem: Education: Goal: Knowledge of disease or condition will improve Outcome: Progressing Goal: Knowledge of secondary prevention will improve (MUST DOCUMENT ALL) Outcome: Progressing Goal: Knowledge of patient specific risk factors will improve (DELETE if not current risk factor) Outcome: Progressing   Problem: Ischemic Stroke/TIA Tissue Perfusion: Goal: Complications of  ischemic stroke/TIA will be minimized 08/22/2023 0625 by Olene Corean CROME, RN Outcome: Progressing 08/22/2023 0122 by Olene Corean CROME, RN Outcome: Progressing   Problem: Coping: Goal: Will verbalize positive feelings about self Outcome: Progressing Goal: Will identify appropriate support needs Outcome: Progressing   Problem: Health Behavior/Discharge Planning: Goal: Ability to manage health-related needs will improve Outcome: Progressing Goal: Goals will be collaboratively established with patient/family Outcome: Progressing   Problem: Self-Care: Goal: Ability to participate in self-care as condition permits will improve Outcome: Progressing Goal: Verbalization of feelings and concerns over difficulty with self-care will improve Outcome: Progressing Goal: Ability to communicate needs accurately will improve Outcome: Progressing   Problem: Nutrition: Goal: Risk of aspiration will decrease Outcome: Progressing Goal: Dietary intake will improve Outcome: Progressing

## 2023-08-22 NOTE — Evaluation (Signed)
 Physical Therapy Evaluation Patient Details Name: Dominique Farley MRN: [id number] DOB: 11/14/1926 Today's Date: 08/22/2023  History of Present Illness  Per MD: Niels Burton Pear is a 89 y.o. female with medical history significant for dementia with significant memory deficit, history of prior CVA, history of breast cancer, HTN, GERD peripheral vascular disease depression/anxiety disorder who presents to the ED from [facility] after another fall  Clinical Impression  Pt is a pleasant 89 yo who demonstrates mod I with mobility at this time.  Pt does not need skilled PT services.        If plan is discharge home, recommend the following:     Can travel by private vehicle    yes    Equipment Recommendations None recommended by PT  Recommendations for Other Services   none    Functional Status Assessment       Precautions / Restrictions Precautions Precautions: None Restrictions Weight Bearing Restrictions Per Provider Order: No      Mobility  Bed Mobility Overal bed mobility: Modified Independent             General bed mobility comments: increased time Patient Response: Cooperative  Transfers Overall transfer level: Modified independent Equipment used: Rolling walker (2 wheels)                    Ambulation/Gait Ambulation/Gait assistance: Modified independent (Device/Increase time) Gait Distance (Feet): 200 Feet Assistive device: Rolling walker (2 wheels) Gait Pattern/deviations: WFL(Within Functional Limits)   Gait velocity interpretation: >2.62 ft/sec, indicative of community ambulatory         Tilt Bed Tilt Bed Patient Response: Cooperative  Modified Rankin (Stroke Patients Only)          Pertinent Vitals/Pain Pain Assessment Pain Assessment: No/denies pain    Home Living Family/patient expects to be discharged to:: Private residence Living Arrangements: Alone Available Help at Discharge: Personal care attendant;Family Type of  Home: House Home Access: Level entry       Home Layout: One level Home Equipment: Agricultural consultant (2 wheels) Additional Comments: family states pt had graduated to ambulating with cane    Prior Function Prior Level of Function : Needs assist             Mobility Comments: SBA ADLs Comments: cooking/ cleaning done by attendants     Extremity/Trunk Assessment    LE WFL for age and sex            Communication   Communication Communication: No apparent difficulties    Cognition Arousal: Alert Behavior During Therapy: WFL for tasks assessed/performed   PT - Cognitive impairments: No apparent impairments                         Following commands: Intact       Cueing Cueing Techniques: Verbal cues     General Comments          Assessment/Plan    PT Assessment Patient does not need any further PT services         PT Goals (Current goals can be found in the Care Plan section)  Acute Rehab PT Goals Patient Stated Goal: To go home PT Goal Formulation: With patient Time For Goal Achievement: 08/24/23 Potential to Achieve Goals: Good         Co-evaluation               AM-PAC PT 6 Clicks Mobility  Outcome Measure Help needed turning  from your back to your side while in a flat bed without using bedrails?: None Help needed moving from lying on your back to sitting on the side of a flat bed without using bedrails?: A Little Help needed moving to and from a bed to a chair (including a wheelchair)?: None Help needed standing up from a chair using your arms (e.g., wheelchair or bedside chair)?: None Help needed to walk in hospital room?: None Help needed climbing 3-5 steps with a railing? : A Little 6 Click Score: 22    End of Session Equipment Utilized During Treatment: Gait belt Activity Tolerance: Patient tolerated treatment well Patient left: in bed;with call bell/phone within reach;with bed alarm set;with family/visitor  present Nurse Communication: Mobility status PT Visit Diagnosis: Unsteadiness on feet (R26.81)    Time: 9085-9058 PT Time Calculation (min) (ACUTE ONLY): 27 min   Charges:   PT Evaluation $PT Eval Low Complexity: 1 Low   PT General Charges $$ ACUTE PT VISIT: 1 Visit     Montie Metro, PT CLT [phone]  08/22/2023, 9:52 AM

## 2023-08-22 NOTE — Progress Notes (Addendum)
   08/22/23 0900  Orthostatic Lying   BP- Lying (!) 160/92  Pulse- Lying 69  Orthostatic Sitting  BP- Sitting 159/65  Pulse- Sitting 72  Orthostatic Standing at 0 minutes  BP- Standing at 0 minutes 158/88  Pulse- Standing at 0 minutes 83  Orthostatic Standing at 3 minutes  BP- Standing at 3 minutes 146/84  Pulse- Standing at 3 minutes 81   No c/o dizziness

## 2023-08-22 NOTE — Progress Notes (Signed)
 PROGRESS NOTE    Patient: Dominique Farley                            PCP: Jolinda Norene HERO, DO                    DOB: [birth year]            DOA: 08/21/2023 FMW:[id number]             DOS: 08/22/2023, 11:21 AM   LOS: 0 days   Date of Service: The patient was seen and examined on 08/22/2023  Subjective:   The patient was seen and examined this morning. Hemodynamically stable. No issues overnight .  Brief Narrative:    Dominique Farley is a 89 y.o. female with medical history significant of CVA, a fib on chronic anticoagulation, hypertension, osteoporosis.  Patient was seen at home by her daughter this morning and she in her usual state of health.  Per daughter rapid and she lives with a caretaker when she had a episode of unresponsiveness that lasted for about 5 minutes or so.  She had a blank stare and drooling the caretaker called the daughter who returned.  By that time the patient was starting to respond.  She did have some confusion after the unresponsiveness resolved.  She is currently being treated for UTI and had been on Keflex , but due to the resistance, the patient was switched to ciprofloxacin  and took her first dose this morning.  No fevers, chills, nausea, vomiting.  No tonic-clonic symptoms.  Per patient's daughters, she is back to baseline.   Assessment & Plan:   Principal Problem:   Unresponsive episode Active Problems:   Essential hypertension   Atrial fibrillation (HCC)   Anticoagulation goal of INR 2 to 3   History of CVA in adulthood   UTI (urinary tract infection)  Unresponsive episode Rule out TIA/stroke-was ruled out, negative for acute finding on CT and MRI of the head, old stroke was identified  - Presentation not consistent with seizures, EEG pending  - Likely vasovagal due to hypotension, and UTI - Will continue close observation on telemetry unit for dysrhythmias - Carotid Dopplers - Echocardiogram - Orthostatic BP checks - Lipid panel LDL within normal  limits of 51 - Troponin 9, 9 - INR 2.0 (on Coumadin  INR therapeutic-vital stable low probability for concern for PE)  History of CVA - Continue Coumadin , not sure patient will benefit from statins at her age  History of A-fib - Continue Coumadin  (not sure if she is a candidate for possible Eliquis or Xarelto) no history of valvular abnormalities or bleeding  Hypertension -Checking orthostatic Bps -Holding home medication of Norvasc   Depression -Continue BuSpar    Nutritional status:  The patient's BMI is: Body mass index is 21.26 kg/m. I agree with the assessment and plan as outlined -----------------------------------------------------------------------------------------------------------------  DVT prophylaxis:   warfarin (COUMADIN ) tablet 7.5 mg  warfarin (COUMADIN ) tablet 5 mg   Code Status:   Code Status: Limited: Do not attempt resuscitation (DNR) -DNR-LIMITED -Do Not Intubate/DNI   Family Communication: Both daughters present at bedside-updated -Advance care planning has been discussed.   Admission status:   Status is: Observation The patient remains OBS appropriate and will d/c before 2 midnights.   Disposition: From  - home             Planning for discharge in 1-2 days   Procedures:   No admission procedures  for hospital encounter.   Antimicrobials:  Anti-infectives (From admission, onward)    Start     Dose/Rate Route Frequency Ordered Stop   08/21/23 2200  ciprofloxacin  (CIPRO ) tablet 500 mg        500 mg Oral 2 times daily 08/21/23 1740          Medication:   [START ON 08/23/2023] aspirin  EC  81 mg Oral Once per day on Monday Wednesday Friday   [START ON 08/26/2023] atorvastatin   40 mg Oral QHS   busPIRone   10 mg Oral QHS   ciprofloxacin   500 mg Oral BID   primidone   75 mg Oral Q8H   [START ON 08/24/2023] warfarin  5 mg Oral Every Tuesday   warfarin  7.5 mg Oral Once per day on Sunday Monday Wednesday Thursday Friday Saturday   Warfarin -  Pharmacist Dosing Inpatient   Does not apply q1600    acetaminophen  **OR** acetaminophen  (TYLENOL ) oral liquid 160 mg/5 mL **OR** acetaminophen , senna-docusate   Objective:   Vitals:   08/21/23 1856 08/21/23 2216 08/22/23 0230 08/22/23 0614  BP: (!) 181/80 127/77 (!) 142/66 (!) 147/84  Pulse: 85 69 71 72  Resp: 18 16    Temp: 98 F (36.7 C) 98.8 F (37.1 C) 97.7 F (36.5 C) 97.6 F (36.4 C)  TempSrc: Oral Oral Oral Oral  SpO2: 95% 100% 94% 98%  Weight:      Height:        Intake/Output Summary (Last 24 hours) at 08/22/2023 1121 Last data filed at 08/22/2023 0800 Gross per 24 hour  Intake 2420 ml  Output --  Net 2420 ml   Filed Weights   08/21/23 1138  Weight: 54.4 kg     Physical examination:   General:  AAO x 3,  cooperative, no distress;   HEENT:  Normocephalic, PERRL, otherwise with in Normal limits   Neuro:  CNII-XII intact. , normal motor and sensation, reflexes intact   Lungs:   Clear to auscultation BL, Respirations unlabored,  No wheezes / crackles  Cardio:    S1/S2, RRR, No murmure, No Rubs or Gallops   Abdomen:  Soft, non-tender, bowel sounds active all four quadrants, no guarding or peritoneal signs.  Muscular  skeletal:  Limited exam -global generalized weaknesses - in bed, able to move all 4 extremities,   2+ pulses,  symmetric, No pitting edema  Skin:  Dry, warm to touch, negative for any Rashes,  Wounds: Please see nursing documentation       ------------------------------------------------------------------------------------------------------------------------------------------    LABs:     Latest Ref Rng & Units 08/21/2023   12:09 PM 06/25/2023    2:58 PM 02/12/2023    3:20 PM  CBC  WBC 4.0 - 10.5 K/uL 6.7  8.9  9.1   Hemoglobin 12.0 - 15.0 g/dL 87.8  87.6  88.4   Hematocrit 36.0 - 46.0 % 37.5  37.5  37.3   Platelets 150 - 400 K/uL 229  279  319       Latest Ref Rng & Units 08/21/2023   12:09 PM 02/12/2023    3:20 PM 09/24/2022    11:18 AM  CMP  Glucose 70 - 99 mg/dL 822  890  874   BUN 8 - 23 mg/dL 22  21  18    Creatinine 0.44 - 1.00 mg/dL 9.25  9.21  9.35   Sodium 135 - 145 mmol/L 139  139  132   Potassium 3.5 - 5.1 mmol/L 4.1  4.4  3.8  Chloride 98 - 111 mmol/L 103  103  98   CO2 22 - 32 mmol/L 27  23  25    Calcium  8.9 - 10.3 mg/dL 9.0  8.9  7.9   Total Protein 6.5 - 8.1 g/dL 6.5  6.5    Total Bilirubin 0.0 - 1.2 mg/dL 0.4  <9.7    Alkaline Phos 38 - 126 U/L 73  109    AST 15 - 41 U/L 29  26    ALT 0 - 44 U/L 24  25         Micro Results Recent Results (from the past 240 hours)  Urine Culture     Status: Abnormal   Collection Time: 08/17/23  4:34 PM   Specimen: Urine   UR  Result Value Ref Range Status   Urine Culture, Routine Final report (A)  Final   Organism ID, Bacteria Klebsiella pneumoniae (A)  Final    Comment: Susceptibility profile is consistent with a probable ESBL. Multi-Drug Resistant Organism Greater than 100,000 colony forming units per mL    Antimicrobial Susceptibility Comment  Final    Comment:       ** S = Susceptible; I = Intermediate; R = Resistant **                    P = Positive; N = Negative             MICS are expressed in micrograms per mL    Antibiotic                 RSLT#1    RSLT#2    RSLT#3    RSLT#4 Amoxicillin /Clavulanic Acid    S Ampicillin                     R Cefazolin                       R Cefepime                       S Cefoxitin                      S Cefpodoxime                    R Ceftriaxone                     R Ciprofloxacin                   S Ertapenem                      S Gentamicin                     S Levofloxacin                   I Meropenem                      S Nitrofurantoin                 R Piperacillin/Tazobactam        S Tetracycline                   R Tobramycin  S Trimethoprim/Sulfa             R     Radiology Reports MR BRAIN WO CONTRAST Result Date: 08/21/2023 CLINICAL DATA:  Initial  evaluation for acute TIA. EXAM: MRI HEAD WITHOUT CONTRAST TECHNIQUE: Multiplanar, multiecho pulse sequences of the brain and surrounding structures were obtained without intravenous contrast. COMPARISON:  CT from earlier the same day. FINDINGS: Brain: Generalized age-related cerebral atrophy with mild chronic microvascular ischemic disease. Remote left basal ganglia infarct with mild chronic hemosiderin staining. No evidence for acute or subacute ischemia. No other areas of chronic cortical infarction. No other acute or chronic intracranial products. No mass lesion, midline shift or mass effect. Ex vacuo dilatation of the left lateral ventricle without hydrocephalus. No extra-axial fluid collection. Pituitary gland within normal limits. Vascular: Major intracranial vascular flow voids are maintained. Skull and upper cervical spine: Craniocervical junction within normal limits. Bone marrow signal intensity normal. No scalp soft tissue abnormality. Sinuses/Orbits: Prior ocular lens replacement on the right. Globes orbital soft tissues demonstrate no acute finding. Paranasal sinuses are largely clear. No significant mastoid effusion. Other: None. IMPRESSION: 1. No acute intracranial abnormality. 2. Remote left basal ganglia infarct. 3. Underlying age-related cerebral atrophy with mild chronic small vessel ischemic disease. Electronically Signed   By: Morene Hoard M.D.   On: 08/21/2023 19:35   CT ANGIO HEAD NECK W WO CM Result Date: 08/21/2023 EXAM: CTA Head and Neck with Intravenous Contrast. CT Head without Contrast. CLINICAL HISTORY: Transient ischemic attack (TIA), remote infarct, and history of stroke. Episode of unresponsiveness, went limp. Returned to baseline. TECHNIQUE: Axial CTA images of the head and neck performed with intravenous contrast. MIP reconstructed images were created and reviewed. Axial computed tomography images of the head/brain performed without intravenous contrast. Note: Per PQRS,  the description of internal carotid artery percent stenosis, including 0 percent or normal exam, is based on [place] American Symptomatic Carotid Endarterectomy Trial (NASCET) criteria. Dose reduction technique was used including one or more of the following: automated exposure control, adjustment of mA and kV according to patient size, and/or iterative reconstruction. CONTRAST: Without and with; 75mL (iohexol  (OMNIPAQUE ) 350 MG/ML injection 75 mL IOHEXOL  350 MG/ML SOLN) COMPARISON: None provided. FINDINGS: CT HEAD: BRAIN: Remote infarct in the left coronaradiata and basal ganglia. Ex vacuo dilatation of the left lateral ventricle. Nonspecific hypoattenuation in the periventricular and subcortical white matter, most likely representing chronic small vessel disease. Mild parenchymal volume loss. No acute intraparenchymal hemorrhage. No mass lesion. No CT evidence for acute territorial infarct. No midline shift or extra-axial collection. VENTRICLES: Ex vacuo dilatation of the left lateral ventricle. ORBITS: Right lens replacement. SINUSES AND MASTOIDS: The paranasal sinuses and mastoid air cells are clear. CTA NECK: COMMON CAROTID ARTERIES: No significant stenosis. No dissection or occlusion. INTERNAL CAROTID ARTERIES: Tortulosity of the proximal right common carotid artery resulting in mild stenosis. Mild atherosclerosis at the right carotid bifurcation without hemodynamically significant stenosis. Tortuosity of the mid right cervical ICA. Mild atherosclerosis at the left carotid bifurcation without hemodynamically significant stenosis. Tortuosity of the proximal and mid left cervical ICA. There is mild irregularity of the distal left cervical ICA with areas of beaded appearance which may reflect fibromuscular dysplasia versus vasospasm. VERTEBRAL ARTERIES: Atherosclerosis at the right vertebral artery origin resulting in mild stenosis. Atherosclerosis of the left vertebral artery origin resulting in mild stenosis.  Vertebral arteries are patent to the vertebrobasilar confluence. CTA HEAD: ANTERIOR CEREBRAL ARTERIES: No significant stenosis. No occlusion. No aneurysm. MIDDLE CEREBRAL ARTERIES:  Slightly diminished appearance of left MCA branches in the anterior left MCA territory likely related to prior infarct. The MCAs are patent bilaterally. POSTERIOR CEREBRAL ARTERIES: No significant stenosis. No occlusion. No aneurysm. BASILAR ARTERY: No significant stenosis. No occlusion. No aneurysm. OTHER: Mild atherosclerosis of the carotid siphons without significant stenosis. The intracranial internal carotid arteries are patent bilaterally. The ACAs are patent bilaterally. SOFT TISSUES: No acute finding. No masses or lymphadenopathy. BONES: Degenerative changes in the visualized spine. IMPRESSION: 1. No acute intracranial abnormality. 2. No large vessel occlusion, high-grade stenosis, or dissection of the arteries in the head and neck. 3. Remote infarct in the left corona radiata and basal ganglia. 4. Mild irregularity of the distal left cervical ICA with areas of beaded appearance, possibly reflecting fibromuscular dysplasia versus vasospasm. Electronically signed by: Donnice Mania MD 08/21/2023 02:43 PM EDT RP Workstation: [id number]    SIGNED: Adriana DELENA Grams, MD, FHM. FAAFP. Jolynn Pack - [facility] Time spent - 55 min.  In seeing, evaluating and examining the patient. Reviewing medical records, labs, drawn plan of care. [facility],  Pager (please use [email or web address] to page/ text) Please use Epic Secure Chat for non-urgent communication (7AM-7PM)  If 7PM-7AM, please contact night-coverage [email or web address], 08/22/2023, 11:21 AM

## 2023-08-22 NOTE — Hospital Course (Addendum)
 Dominique Farley is a 89 y.o. female with medical history significant of CVA, a fib on chronic anticoagulation, hypertension, osteoporosis.  Patient was seen at home by her daughter this morning and she in her usual state of health.  Per daughter rapid and she lives with a caretaker when she had a episode of unresponsiveness that lasted for about 5 minutes or so.  She had a blank stare and drooling the caretaker called the daughter who returned.  By that time the patient was starting to respond.  She did have some confusion after the unresponsiveness resolved.  She is currently being treated for UTI and had been on Keflex , but due to the resistance, the patient was switched to ciprofloxacin  and took her first dose this morning.  No fevers, chills, nausea, vomiting.  No tonic-clonic symptoms.  Per patient's daughters, she is back to baseline.   Assessment & Plan:   Principal Problem:   Unresponsive episode Active Problems:   Essential hypertension   Atrial fibrillation (HCC)   Anticoagulation goal of INR 2 to 3   History of CVA in adulthood   UTI (urinary tract infection)  Unresponsive episode Rule out TIA/stroke-was ruled out, negative for acute finding on CT and MRI of the head, old stroke was identified  - Presentation not consistent with seizures, EEG pending  - Likely vasovagal due to hypotension, and UTI - Will continue close observation on telemetry unit for dysrhythmias - Carotid Dopplers - Echocardiogram - Orthostatic BP checks - Lipid panel LDL within normal limits of 51 - Troponin 9, 9 - INR 2.0 (on Coumadin  INR therapeutic-vital stable low probability for concern for PE)  History of CVA - Continue Coumadin , not sure patient will benefit from statins at her age  History of A-fib - Continue Coumadin  (not sure if she is a candidate for possible Eliquis or Xarelto) no history of valvular abnormalities or bleeding  Hypertension -Checking orthostatic Bps -Holding home medication  of Norvasc   Depression -Continue BuSpar 

## 2023-08-22 NOTE — Progress Notes (Signed)
   08/22/23 1345  TOC Brief Assessment  Insurance and Status Reviewed  Patient has primary care physician Yes  Home environment has been reviewed From home  Prior level of function: Assisted  Prior/Current Home Services No current home services  Social Drivers of Health Review SDOH reviewed no interventions necessary  Readmission risk has been reviewed Yes  Transition of care needs no transition of care needs at this time   Pt from home c/around-the-clock aides and supportive family.  Pt plans to return home at dc, daughter to provide transportation.

## 2023-08-22 NOTE — Care Management Obs Status (Signed)
 MEDICARE OBSERVATION STATUS NOTIFICATION   Patient Details  Name: Dominique Farley MRN: [id number] Date of Birth: 28-Jan-1926   Medicare Observation Status Notification Given:  Yes    Nena LITTIE Coffee, RN 08/22/2023, 12:16 PM

## 2023-08-23 DIAGNOSIS — R404 Transient alteration of awareness: Secondary | ICD-10-CM | POA: Diagnosis not present

## 2023-08-23 LAB — PROTIME-INR
INR: 2 — ABNORMAL HIGH (ref 0.8–1.2)
Prothrombin Time: 23.5 s — ABNORMAL HIGH (ref 11.4–15.2)

## 2023-08-23 LAB — HEMOGLOBIN A1C
Hgb A1c MFr Bld: 5.9 % — ABNORMAL HIGH (ref 4.8–5.6)
Mean Plasma Glucose: 123 mg/dL

## 2023-08-23 MED ORDER — SODIUM CHLORIDE 0.9 % IV SOLN: INTRAVENOUS | Status: DC

## 2023-08-23 NOTE — Discharge Summary (Signed)
 Physician Discharge Summary   Patient: Dominique Farley MRN: [id number] DOB: 1926/10/30  Admit date:     08/21/2023  Discharge date: 08/23/23  Discharge Physician: Adriana DELENA Grams   PCP: Jolinda Norene HERO, DO   Recommendations at discharge:   Fall precautions, continue PT OT Continue oral hydration, Continue current recommended antibiotics  Discharge Diagnoses: Principal Problem:   Unresponsive episode Active Problems:   Essential hypertension   Atrial fibrillation (HCC)   Anticoagulation goal of INR 2 to 3   History of CVA in adulthood   UTI (urinary tract infection)  Resolved Problems:   * No resolved hospital problems. *  Hospital Course:  Dominique Farley is a 89 y.o. female with medical history significant of CVA, a fib on chronic anticoagulation, hypertension, osteoporosis.  Patient was seen at home by her daughter this morning and she in her usual state of health.  Per daughter rapid and she lives with a caretaker when she had a episode of unresponsiveness that lasted for about 5 minutes or so.  She had a blank stare and drooling the caretaker called the daughter who returned.  By that time the patient was starting to respond.  She did have some confusion after the unresponsiveness resolved.  She is currently being treated for UTI and had been on Keflex , but due to the resistance, the patient was switched to ciprofloxacin  and took her first dose this morning.  No fevers, chills, nausea, vomiting.  No tonic-clonic symptoms.  Per patient's daughters, she is back to baseline.  Unresponsive episode Rule out TIA/stroke-was ruled out, negative for acute finding on CT and MRI of the head, old stroke was identified   - Presentation not consistent with seizures, EEG pending   - Likely vasovagal due to hypotension, and UTI - Will continue close observation on telemetry unit for dysrhythmias - Carotid Dopplers:  - Echocardiogram - Orthostatic BP checks - Lipid panel LDL within normal  limits of 51 - Troponin 9, 9 - INR 2.0 (on Coumadin  INR therapeutic-vital stable low probability for concern for PE)   History of CVA - Continue Coumadin , not sure patient will benefit from statins at her age   History of A-fib - Continue Coumadin  (not sure if she is a candidate for possible Eliquis or Xarelto) no history of valvular abnormalities or bleeding   Hypertension -Checking orthostatic Bps -Holding home medication of Norvasc    Depression -Continue BuSpar      Disposition: Home health Diet recommendation:  Discharge Diet Orders (From admission, onward)     Start     Ordered   08/23/23 0000  Diet - low sodium heart healthy        08/23/23 0752           Regular diet DISCHARGE MEDICATION: Allergies as of 08/23/2023       Reactions   Codeine Other (See Comments)   Unknown    Influenza Vaccines Other (See Comments)   Unknown    Remeron  [mirtazapine ] Other (See Comments)   Dizziness    Sulfa Antibiotics Hives        Medication List     STOP taking these medications    amLODipine  5 MG tablet Commonly known as: NORVASC    atorvastatin  40 MG tablet Commonly known as: LIPITOR   cephALEXin  500 MG capsule Commonly known as: KEFLEX        TAKE these medications    ascorbic acid 500 MG tablet Commonly known as: VITAMIN C Take 500 mg by mouth daily.  aspirin  EC 81 MG tablet Take 81 mg by mouth 3 (three) times a week. M,W,F   busPIRone  10 MG tablet Commonly known as: BUSPAR  TAKE 1 TABLET BY MOUTH AT BEDTIME   ciprofloxacin  500 MG tablet Commonly known as: Cipro  Take 1 tablet (500 mg total) by mouth 2 (two) times daily for 3 days.   hydrocortisone cream 1 % Apply 1 Application topically 2 (two) times daily.   neomycin-bacitracin-polymyxin [phone] Oint Apply 1 Application topically 2 (two) times daily as needed.   polyethylene glycol 17 g packet Commonly known as: MIRALAX  / GLYCOLAX  Take 17 g by mouth at bedtime.   primidone  50 MG  tablet Commonly known as: MYSOLINE  TAKE 1 AND 1/2 TABLETS BY MOUTH THREE TIMES DAILY What changed: See the new instructions.   warfarin 5 MG tablet Commonly known as: COUMADIN  Take as directed. If you are unsure how to take this medication, talk to your nurse or doctor. Original instructions: TAKE 1 AND 1/2 TABLETS BY MOUTH EVERY DAY EXCEPT TAKE 1 TABLETS ON TUESDAY What changed:  how much to take how to take this when to take this        Discharge Exam: Filed Weights   08/21/23 1138  Weight: 54.4 kg        General:  AAO x 3,  cooperative, no distress;   HEENT:  Normocephalic, PERRL, otherwise with in Normal limits   Neuro:  CNII-XII intact. , normal motor and sensation, reflexes intact   Lungs:   Clear to auscultation BL, Respirations unlabored,  No wheezes / crackles  Cardio:    S1/S2, RRR, No murmure, No Rubs or Gallops   Abdomen:  Soft, non-tender, bowel sounds active all four quadrants, no guarding or peritoneal signs.  Muscular  skeletal:  Limited exam -global generalized weaknesses - in bed, able to move all 4 extremities,   2+ pulses,  symmetric, No pitting edema  Skin:  Dry, warm to touch, negative for any Rashes,  Wounds: Please see nursing documentation          Condition at discharge: fair  The results of significant diagnostics from this hospitalization (including imaging, microbiology, ancillary and laboratory) are listed below for reference.   Imaging Studies: ECHOCARDIOGRAM COMPLETE Result Date: 08/22/2023    ECHOCARDIOGRAM REPORT   Patient Name:   [name] Date of Exam: 08/22/2023 Medical Rec #:  [id number]  Height:       63.0 in Accession #:    [id number] Weight:       120.0 lb Date of Birth:  03/04/1926 BSA:          1.556 m Patient Age:    89 years   BP:           147/84 mmHg Patient Gender: F          HR:           72 bpm. Exam Location:  [facility] Procedure: 2D Echo, 3D Echo, Cardiac Doppler and Color Doppler (Both Spectral            and  Color Flow Doppler were utilized during procedure). Indications:    TIA G45.9  History:        Patient has no prior history of Echocardiogram examinations. TIA                 and hx of CVA, Arrythmias:Atrial Fibrillation,                 Signs/Symptoms:Syncope; Risk Factors:Hypertension.  Sonographer:    Koleen Popper RDCS Referring Phys: [phone] JACOB J STINSON IMPRESSIONS  1. Left ventricular ejection fraction, by estimation, is 60 to 65%. The left ventricle has normal function. Left ventricular endocardial border not optimally defined to evaluate regional wall motion. Left ventricular diastolic parameters are indeterminate.  2. Right ventricular systolic function is normal. The right ventricular size is mildly enlarged. There is severely elevated pulmonary artery systolic pressure.  3. Left atrial size was moderately dilated.  4. Right atrial size was severely dilated.  5. The mitral valve is normal in structure. Mild mitral valve regurgitation. No evidence of mitral stenosis.  6. The tricuspid valve is abnormal. Tricuspid valve regurgitation is moderate to severe.  7. The aortic valve is tricuspid. There is moderate calcification of the aortic valve. There is moderate thickening of the aortic valve. Aortic valve regurgitation is mild. No aortic stenosis is present. Aortic regurgitation PHT measures 426 msec.  8. The inferior vena cava is normal in size but collapsibility could not be evaluated. Comparison(s): No prior Echocardiogram. FINDINGS  Left Ventricle: Left ventricular ejection fraction, by estimation, is 60 to 65%. The left ventricle has normal function. Left ventricular endocardial border not optimally defined to evaluate regional wall motion. Strain was performed and the global longitudinal strain is indeterminate. The left ventricular internal cavity size was normal in size. There is no left ventricular hypertrophy. Left ventricular diastolic parameters are indeterminate. Right Ventricle: The right  ventricular size is mildly enlarged. No increase in right ventricular wall thickness. Right ventricular systolic function is normal. There is severely elevated pulmonary artery systolic pressure. The tricuspid regurgitant velocity is 3.78 m/s, and with an assumed right atrial pressure of 3 mmHg, the estimated right ventricular systolic pressure is 60.2 mmHg. Left Atrium: Left atrial size was moderately dilated. Right Atrium: Right atrial size was severely dilated. Pericardium: There is no evidence of pericardial effusion. Mitral Valve: The mitral valve is normal in structure. Mild mitral valve regurgitation. No evidence of mitral valve stenosis. Tricuspid Valve: The tricuspid valve is abnormal. Tricuspid valve regurgitation is moderate to severe. No evidence of tricuspid stenosis. Aortic Valve: The aortic valve is tricuspid. There is moderate calcification of the aortic valve. There is moderate thickening of the aortic valve. Aortic valve regurgitation is mild. Aortic regurgitation PHT measures 426 msec. No aortic stenosis is present. Pulmonic Valve: The pulmonic valve was grossly normal. Pulmonic valve regurgitation is not visualized. No evidence of pulmonic stenosis. Aorta: The aortic root and ascending aorta are structurally normal, with no evidence of dilitation. Venous: The inferior vena cava is normal in size but collapsibility could not be evaluated. IAS/Shunts: No atrial level shunt detected by color flow Doppler. Additional Comments: 3D was performed not requiring image post processing on an independent workstation and was indeterminate.  LEFT VENTRICLE PLAX 2D LVIDd:         4.30 cm LVIDs:         3.10 cm LV PW:         1.00 cm LV IVS:        1.00 cm LVOT diam:     1.90 cm LV SV:         73 LV SV Index:   47 LVOT Area:     2.84 cm  RIGHT VENTRICLE            IVC RV Basal diam:  4.30 cm    IVC diam: 1.50 cm RV S prime:     9.14 cm/s TAPSE (M-mode):  1.0 cm LEFT ATRIUM             Index        RIGHT ATRIUM            Index LA diam:        4.60 cm 2.96 cm/m   RA Area:     26.10 cm LA Vol (A2C):   48.0 ml 30.84 ml/m  RA Volume:   92.00 ml  59.11 ml/m LA Vol (A4C):   66.5 ml 42.73 ml/m LA Biplane Vol: 58.5 ml 37.59 ml/m  AORTIC VALVE LVOT Vmax:   115.00 cm/s LVOT Vmean:  76.500 cm/s LVOT VTI:    0.258 m AI PHT:      426 msec  AORTA Ao Root diam: 2.50 cm Ao Asc diam:  3.40 cm MR Peak grad: 121.9 mmHg  TRICUSPID VALVE MR Vmax:      552.00 cm/s TR Peak grad:   57.2 mmHg                           TR Vmax:        378.00 cm/s                            SHUNTS                           Systemic VTI:  0.26 m                           Systemic Diam: 1.90 cm Vishnu Priya Mallipeddi Electronically signed by Diannah Late Mallipeddi Signature Date/Time: 08/22/2023/4:48:18 PM    Final    MR BRAIN WO CONTRAST Result Date: 08/21/2023 CLINICAL DATA:  Initial evaluation for acute TIA. EXAM: MRI HEAD WITHOUT CONTRAST TECHNIQUE: Multiplanar, multiecho pulse sequences of the brain and surrounding structures were obtained without intravenous contrast. COMPARISON:  CT from earlier the same day. FINDINGS: Brain: Generalized age-related cerebral atrophy with mild chronic microvascular ischemic disease. Remote left basal ganglia infarct with mild chronic hemosiderin staining. No evidence for acute or subacute ischemia. No other areas of chronic cortical infarction. No other acute or chronic intracranial products. No mass lesion, midline shift or mass effect. Ex vacuo dilatation of the left lateral ventricle without hydrocephalus. No extra-axial fluid collection. Pituitary gland within normal limits. Vascular: Major intracranial vascular flow voids are maintained. Skull and upper cervical spine: Craniocervical junction within normal limits. Bone marrow signal intensity normal. No scalp soft tissue abnormality. Sinuses/Orbits: Prior ocular lens replacement on the right. Globes orbital soft tissues demonstrate no acute finding. Paranasal sinuses  are largely clear. No significant mastoid effusion. Other: None. IMPRESSION: 1. No acute intracranial abnormality. 2. Remote left basal ganglia infarct. 3. Underlying age-related cerebral atrophy with mild chronic small vessel ischemic disease. Electronically Signed   By: Morene Hoard M.D.   On: 08/21/2023 19:35   CT ANGIO HEAD NECK W WO CM Result Date: 08/21/2023 EXAM: CTA Head and Neck with Intravenous Contrast. CT Head without Contrast. CLINICAL HISTORY: Transient ischemic attack (TIA), remote infarct, and history of stroke. Episode of unresponsiveness, went limp. Returned to baseline. TECHNIQUE: Axial CTA images of the head and neck performed with intravenous contrast. MIP reconstructed images were created and reviewed. Axial computed tomography images of the head/brain performed without intravenous contrast. Note: Per PQRS, the description of internal carotid  artery percent stenosis, including 0 percent or normal exam, is based on [place] American Symptomatic Carotid Endarterectomy Trial (NASCET) criteria. Dose reduction technique was used including one or more of the following: automated exposure control, adjustment of mA and kV according to patient size, and/or iterative reconstruction. CONTRAST: Without and with; 75mL (iohexol  (OMNIPAQUE ) 350 MG/ML injection 75 mL IOHEXOL  350 MG/ML SOLN) COMPARISON: None provided. FINDINGS: CT HEAD: BRAIN: Remote infarct in the left coronaradiata and basal ganglia. Ex vacuo dilatation of the left lateral ventricle. Nonspecific hypoattenuation in the periventricular and subcortical white matter, most likely representing chronic small vessel disease. Mild parenchymal volume loss. No acute intraparenchymal hemorrhage. No mass lesion. No CT evidence for acute territorial infarct. No midline shift or extra-axial collection. VENTRICLES: Ex vacuo dilatation of the left lateral ventricle. ORBITS: Right lens replacement. SINUSES AND MASTOIDS: The paranasal sinuses and  mastoid air cells are clear. CTA NECK: COMMON CAROTID ARTERIES: No significant stenosis. No dissection or occlusion. INTERNAL CAROTID ARTERIES: Tortulosity of the proximal right common carotid artery resulting in mild stenosis. Mild atherosclerosis at the right carotid bifurcation without hemodynamically significant stenosis. Tortuosity of the mid right cervical ICA. Mild atherosclerosis at the left carotid bifurcation without hemodynamically significant stenosis. Tortuosity of the proximal and mid left cervical ICA. There is mild irregularity of the distal left cervical ICA with areas of beaded appearance which may reflect fibromuscular dysplasia versus vasospasm. VERTEBRAL ARTERIES: Atherosclerosis at the right vertebral artery origin resulting in mild stenosis. Atherosclerosis of the left vertebral artery origin resulting in mild stenosis. Vertebral arteries are patent to the vertebrobasilar confluence. CTA HEAD: ANTERIOR CEREBRAL ARTERIES: No significant stenosis. No occlusion. No aneurysm. MIDDLE CEREBRAL ARTERIES: Slightly diminished appearance of left MCA branches in the anterior left MCA territory likely related to prior infarct. The MCAs are patent bilaterally. POSTERIOR CEREBRAL ARTERIES: No significant stenosis. No occlusion. No aneurysm. BASILAR ARTERY: No significant stenosis. No occlusion. No aneurysm. OTHER: Mild atherosclerosis of the carotid siphons without significant stenosis. The intracranial internal carotid arteries are patent bilaterally. The ACAs are patent bilaterally. SOFT TISSUES: No acute finding. No masses or lymphadenopathy. BONES: Degenerative changes in the visualized spine. IMPRESSION: 1. No acute intracranial abnormality. 2. No large vessel occlusion, high-grade stenosis, or dissection of the arteries in the head and neck. 3. Remote infarct in the left corona radiata and basal ganglia. 4. Mild irregularity of the distal left cervical ICA with areas of beaded appearance, possibly  reflecting fibromuscular dysplasia versus vasospasm. Electronically signed by: Donnice Mania MD 08/21/2023 02:43 PM EDT RP Workstation: [id number]    Microbiology: Results for orders placed or performed in visit on 08/17/23  Urine Culture     Status: Abnormal   Collection Time: 08/17/23  4:34 PM   Specimen: Urine   UR  Result Value Ref Range Status   Urine Culture, Routine Final report (A)  Final   Organism ID, Bacteria Klebsiella pneumoniae (A)  Final    Comment: Susceptibility profile is consistent with a probable ESBL. Multi-Drug Resistant Organism Greater than 100,000 colony forming units per mL    Antimicrobial Susceptibility Comment  Final    Comment:       ** S = Susceptible; I = Intermediate; R = Resistant **                    P = Positive; N = Negative             MICS are expressed in micrograms per mL    Antibiotic  RSLT#1    RSLT#2    RSLT#3    RSLT#4 Amoxicillin /Clavulanic Acid    S Ampicillin                     R Cefazolin                       R Cefepime                       S Cefoxitin                      S Cefpodoxime                    R Ceftriaxone                     R Ciprofloxacin                   S Ertapenem                      S Gentamicin                     S Levofloxacin                   I Meropenem                      S Nitrofurantoin                 R Piperacillin/Tazobactam        S Tetracycline                   R Tobramycin                     S Trimethoprim/Sulfa             R     Labs: CBC: Recent Labs  Lab 08/21/23 1209  WBC 6.7  NEUTROABS 4.8  HGB 12.1  HCT 37.5  MCV 101.1*  PLT 229   Basic Metabolic Panel: Recent Labs  Lab 08/21/23 1209  NA 139  K 4.1  CL 103  CO2 27  GLUCOSE 177*  BUN 22  CREATININE 0.74  CALCIUM  9.0   Liver Function Tests: Recent Labs  Lab 08/21/23 1209  AST 29  ALT 24  ALKPHOS 73  BILITOT 0.4  PROT 6.5  ALBUMIN 3.2*   CBG: No results for input(s): GLUCAP in the  last 168 hours.  Discharge time spent: greater than 30 minutes.  Signed: Adriana DELENA Grams, MD [facility] 08/23/2023

## 2023-08-23 NOTE — TOC Transition Note (Signed)
 Transition of Care [facility]) - Discharge Note   Patient Details  Name: Dominique Farley MRN: [id number] Date of Birth: October 31, 1926  [facility]) CM/SW Contact:  Lucie Lunger, LCSWA Phone Number: 08/23/2023, 9:17 AM   Clinical Narrative:    CSW notes that MD placed [facility] PT/OT/SW orders. CSW spoke with pts daughter to review if they would like HH arranged. Pts daughter is agreeable to [facility] being set up and does not have an agency preference. Pts daughter requested only PT at this time, CSW reached out to MD to see if orders can be changed to only [facility] PT. CSW spoke to [place] with [facility] who states they can accept [facility] PT referral at this time. CSW added [facility] agency info to AVS for daughter to review. TOC signing off.   Final next level of care: Home w Home Health Services Barriers to Discharge: Barriers Resolved   Patient Goals and CMS Choice Patient states their goals for this hospitalization and ongoing recovery are:: return home CMS Medicare.gov Compare Post Acute Care list provided to:: Patient Represenative (must comment) Choice offered to / list presented to : Adult Children      Discharge Placement                  Name of family member notified: daughter Patient and family notified of of transfer: 08/23/23  Discharge Plan and Services Additional resources added to the After Visit Summary for                            [facility] Arranged: PT [facility] Agency: [facility] Health Care Date [facility] Agency Contacted: 08/23/23   Representative spoke with at [facility] Agency: Darleene  Social Drivers of Health (SDOH) Interventions SDOH Screenings   Food Insecurity: No Food Insecurity (08/21/2023)  Housing: Low Risk  (08/21/2023)  Transportation Needs: No Transportation Needs (08/21/2023)  Utilities: Not At Risk (08/21/2023)  Depression (PHQ2-9): Low Risk  (08/17/2023)  Financial Resource Strain: Low Risk  (11/21/2020)   Received from [facility]  Social Connections: Socially Isolated (08/21/2023)   Tobacco Use: Low Risk  (08/17/2023)     Readmission Risk Interventions     No data to display

## 2023-08-23 NOTE — Progress Notes (Signed)
 PHARMACY - ANTICOAGULATION CONSULT NOTE  Pharmacy Consult for Warfarin  Indication: atrial fibrillation  Allergies  Allergen Reactions   Codeine Other (See Comments)    Unknown    Influenza Vaccines Other (See Comments)    Unknown    Remeron  [Mirtazapine ] Other (See Comments)    Dizziness    Sulfa Antibiotics Hives    Patient Measurements: Height: 5' 3 (160 cm) Weight: 54.4 kg (120 lb) IBW/kg (Calculated) : 52.4 HEPARIN DW (KG): 54.4  Vital Signs: Temp: 99.2 F (37.3 C) (08/11 0452) Temp Source: Oral (08/11 0452) BP: 145/76 (08/11 0452) Pulse Rate: 79 (08/11 0452)  Labs: Recent Labs    08/21/23 1209 08/21/23 1607 08/22/23 0345 08/23/23 0347  HGB 12.1  --   --   --   HCT 37.5  --   --   --   PLT 229  --   --   --   APTT 40*  --   --   --   LABPROT 23.6*  --  23.7* 23.5*  INR 2.0*  --  2.0* 2.0*  CREATININE 0.74  --   --   --   TROPONINIHS 9 9  --   --     Estimated Creatinine Clearance: 35.6 mL/min (by C-G formula based on SCr of 0.74 mg/dL).   Medical History: Past Medical History:  Diagnosis Date   Acute cystitis with hematuria 11/22/2020   Atrial fibrillation (HCC)    BCC (basal cell carcinoma of skin) 11/14/2004   Right Mid Cheek(Cx3,5FU)   BCC (basal cell carcinoma of skin) 04/16/2011   Right Crease(tx p bx)   BCC (basal cell carcinoma of skin) 06/16/2016   Left Nare(tx p bx)   BCC (basal cell carcinoma of skin) Atypical Basaloid 09/29/2018   Left Side Nose(tx p bx)   Cerebrovascular accident (CVA) due to occlusion of left middle cerebral artery (HCC) 10/18/2018   Hypertension    Osteoporosis    Pneumonia of lower lobe due to infectious organism 11/20/2020   SCC (squamous cell carcinoma) 09/29/2018   Left Jawline(tx p bx)   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   Right Jawline(Cx3,5FU) and Left Upper Cheek(Cx3,5FU)   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   Left Jawline(tx p bx), Nose(tx p bx), Right Cheek(tx p bx), and Right  Forearm(tx p bx)   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   Right Temple(Cx3,5FU) and Left Outer Eye(Cx3,5FU)   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   Right Forearm(Cx3,5FU)   Squamous cell carcinoma in situ (SCCIS) 03/13/2005   Left Side of Nose(Cx3,5FU)   Squamous cell carcinoma in situ (SCCIS) 06/04/2005   Inner Right Cheek/Eye(Tx p Bx)   Squamous cell carcinoma in situ (SCCIS) 02/12/2010   Right Cheek(tx p bx)   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 Patient has a history of afib and takes warfarin. Per med history she takes 7.5 mg daily except 5mg  on Tuesdays. Most recent outpatient MD note states patient is compliant with warfarin and INR within range, dose not included in note.  Patient on primidone  at home as well.  INR 2 > 2   Goal of Therapy:  INR 2-3 Monitor platelets by anticoagulation protocol: Yes   Plan:  Continue home dose of 7.5mg  daily and 5mg  on Tuesday  Daily INR checks.  Elspeth Sour, PharmD Clinical Pharmacist 08/23/2023 7:55 AM

## 2023-08-24 ENCOUNTER — Telehealth: Payer: Self-pay

## 2023-08-24 NOTE — Transitions of Care (Post Inpatient/ED Visit) (Signed)
 08/24/2023  Name: Dominique Farley MRN: [id number] DOB: [birth year]  Today's TOC FU Call Status: Today's TOC FU Call Status:: Successful TOC FU Call Completed TOC FU Call Complete Date: 08/24/23 Patient's Name and Date of Birth confirmed.  Transition Care Management Follow-up Telephone Call Date of Discharge: 08/23/23 [facility] Type of Discharge: Inpatient Admission Primary Inpatient Discharge Diagnosis:: syncope How have you been since you were released from the hospital?: Better Any questions or concerns?: No  Items Reviewed: Did you receive and understand the discharge instructions provided?: Yes Medications obtained,verified, and reconciled?: Yes (Medications Reviewed) Any new allergies since your discharge?: No Dietary orders reviewed?: Yes Do you have support at home?: Yes People in Home [RPT]: child(ren), adult  Medications Reviewed Today: Medications Reviewed Today     Reviewed by Emmitt Pan, LPN (Licensed Practical Nurse) on 08/24/23 at 1539  Med List Status: <None>   Medication Order Taking? Sig Documenting Provider Last Dose Status Informant  ascorbic acid (VITAMIN C) 500 MG tablet [id number] Yes Take 500 mg by mouth daily. [provider]  Active Child, Pharmacy Records  aspirin  EC 81 MG tablet [id number] Yes Take 81 mg by mouth 3 (three) times a week. M,W,F [provider]  Active Child, Pharmacy Records  busPIRone  (BUSPAR ) 10 MG tablet [id number] Yes TAKE 1 TABLET BY MOUTH AT BEDTIME Jolinda Potter M, DO  Active Child, Pharmacy Records  hydrocortisone cream 1 % [id number] Yes Apply 1 Application topically 2 (two) times daily. [provider]  Active Child, Pharmacy Records  neomycin-bacitracin-polymyxin [phone] OINT [id number] Yes Apply 1 Application topically 2 (two) times daily as needed. [facility] Hummer, Nena, NP  Active Child, Pharmacy Records  polyethylene glycol (MIRALAX  / GLYCOLAX ) 17 g packet  [id number] Yes Take 17 g by mouth at bedtime. [provider]  Active Child, Pharmacy Records           Med Note DRENA CHUCK MATSU   Sat Aug 21, 2023  5:18 PM) Pt did have prune juice this morning per pt's child  primidone  (MYSOLINE ) 50 MG tablet [id number] Yes TAKE 1 AND 1/2 TABLETS BY MOUTH THREE TIMES DAILY  Patient taking differently: Take 75 mg by mouth 3 (three) times daily.   Jolinda Potter HERO, DO  Active Child, Pharmacy Records  warfarin (COUMADIN ) 5 MG tablet [id number] Yes TAKE 1 AND 1/2 TABLETS BY MOUTH EVERY DAY EXCEPT TAKE 1 TABLETS ON TUESDAY  Patient taking differently: Take 5-7.5 mg by mouth one time only at 4 PM. TAKE 1 AND 1/2 TABLETS BY MOUTH EVERY DAY EXCEPT TAKE 1 TABLETS ON TUESDAY   Jolinda Potter HERO, DO  Active Child, Pharmacy Records            Home Care and Equipment/Supplies: Were Home Health Services Ordered?: NA Any new equipment or medical supplies ordered?: NA  Functional Questionnaire: Do you need assistance with bathing/showering or dressing?: Yes Do you need assistance with meal preparation?: Yes Do you need assistance with eating?: No Do you have difficulty maintaining continence: Yes Do you need assistance with getting out of bed/getting out of a chair/moving?: No Do you have difficulty managing or taking your medications?: Yes  Follow up appointments reviewed: PCP Follow-up appointment confirmed?: Yes Date of PCP follow-up appointment?: 09/01/23 Follow-up Provider: [facility] Follow-up appointment confirmed?: NA Do you need transportation to your follow-up appointment?: No Do you understand care options if your condition(s) worsen?: Yes-patient verbalized understanding    SIGNATURE Pan Emmitt, LPN [facility]  Chief Financial Officer [phone]

## 2023-08-25 ENCOUNTER — Other Ambulatory Visit: Payer: Self-pay | Admitting: Family Medicine

## 2023-08-25 ENCOUNTER — Encounter: Payer: Self-pay | Admitting: Family Medicine

## 2023-08-25 DIAGNOSIS — I63512 Cerebral infarction due to unspecified occlusion or stenosis of left middle cerebral artery: Secondary | ICD-10-CM

## 2023-08-25 DIAGNOSIS — I10 Essential (primary) hypertension: Secondary | ICD-10-CM

## 2023-08-27 ENCOUNTER — Telehealth: Payer: Self-pay

## 2023-08-27 ENCOUNTER — Other Ambulatory Visit: Payer: Self-pay | Admitting: Family Medicine

## 2023-08-27 DIAGNOSIS — Z7901 Long term (current) use of anticoagulants: Secondary | ICD-10-CM | POA: Diagnosis not present

## 2023-08-27 DIAGNOSIS — I63512 Cerebral infarction due to unspecified occlusion or stenosis of left middle cerebral artery: Secondary | ICD-10-CM

## 2023-08-27 DIAGNOSIS — C44619 Basal cell carcinoma of skin of left upper limb, including shoulder: Secondary | ICD-10-CM | POA: Diagnosis not present

## 2023-08-27 DIAGNOSIS — Z9181 History of falling: Secondary | ICD-10-CM | POA: Diagnosis not present

## 2023-08-27 DIAGNOSIS — I083 Combined rheumatic disorders of mitral, aortic and tricuspid valves: Secondary | ICD-10-CM | POA: Diagnosis not present

## 2023-08-27 DIAGNOSIS — E785 Hyperlipidemia, unspecified: Secondary | ICD-10-CM | POA: Diagnosis not present

## 2023-08-27 DIAGNOSIS — Z8673 Personal history of transient ischemic attack (TIA), and cerebral infarction without residual deficits: Secondary | ICD-10-CM | POA: Diagnosis not present

## 2023-08-27 DIAGNOSIS — N3000 Acute cystitis without hematuria: Secondary | ICD-10-CM | POA: Diagnosis not present

## 2023-08-27 DIAGNOSIS — Z7982 Long term (current) use of aspirin: Secondary | ICD-10-CM | POA: Diagnosis not present

## 2023-08-27 DIAGNOSIS — I119 Hypertensive heart disease without heart failure: Secondary | ICD-10-CM | POA: Diagnosis not present

## 2023-08-27 DIAGNOSIS — I4891 Unspecified atrial fibrillation: Secondary | ICD-10-CM | POA: Diagnosis not present

## 2023-08-27 DIAGNOSIS — I10 Essential (primary) hypertension: Secondary | ICD-10-CM

## 2023-08-27 DIAGNOSIS — M810 Age-related osteoporosis without current pathological fracture: Secondary | ICD-10-CM | POA: Diagnosis not present

## 2023-08-27 NOTE — Telephone Encounter (Signed)
 Left detailed message.

## 2023-08-27 NOTE — Telephone Encounter (Signed)
 Copied from CRM #[id number]. Topic: Clinical - Home Health Verbal Orders >> Aug 27, 2023 12:54 PM Avram MATSU wrote: Caller/Agency: Odis Rushing Number: [phone] Service Requested: Physical Therapy Frequency: 1*1 2*3 Any new concerns about the patient? No

## 2023-09-01 ENCOUNTER — Ambulatory Visit (INDEPENDENT_AMBULATORY_CARE_PROVIDER_SITE_OTHER): Admitting: Family Medicine

## 2023-09-01 ENCOUNTER — Encounter: Payer: Self-pay | Admitting: Family Medicine

## 2023-09-01 VITALS — BP 126/82 | HR 101 | Temp 97.4°F | Ht 63.0 in | Wt 130.1 lb

## 2023-09-01 DIAGNOSIS — Z7901 Long term (current) use of anticoagulants: Secondary | ICD-10-CM

## 2023-09-01 DIAGNOSIS — Z5181 Encounter for therapeutic drug level monitoring: Secondary | ICD-10-CM | POA: Diagnosis not present

## 2023-09-01 DIAGNOSIS — Z09 Encounter for follow-up examination after completed treatment for conditions other than malignant neoplasm: Secondary | ICD-10-CM

## 2023-09-01 DIAGNOSIS — R569 Unspecified convulsions: Secondary | ICD-10-CM | POA: Diagnosis not present

## 2023-09-01 DIAGNOSIS — R404 Transient alteration of awareness: Secondary | ICD-10-CM

## 2023-09-01 DIAGNOSIS — I4811 Longstanding persistent atrial fibrillation: Secondary | ICD-10-CM

## 2023-09-01 LAB — COAGUCHEK XS/INR WAIVED
INR: 1.4 — ABNORMAL HIGH (ref 0.9–1.1)
Prothrombin Time: 16.5 s

## 2023-09-01 NOTE — Progress Notes (Signed)
 Subjective: CC:Hosp dc follow up PCP: Jolinda Norene HERO, DO YEP:Dominique Farley is a 89 y.o. female presenting to clinic today for:  1.  Hospital discharge follow-up for syncope She was diagnosed as an unresponsive episode.  She had evaluation for multiple possible etiologies.  However, no evidence of PE or RVR.  Her A-fib was stable.  Her INR was therapeutic at 2.0.  They did not feel it was related to the 1 dose of Cipro  she had received.  She was advised her to continue the antibiotic for completion.  Imaging of the brain did not demonstrate any acute strokes.  Looks like EEG was ordered but never completed.  Goes on to describe the event as her somewhat slumping over/looking at nothing and never fully losing consciousness.  She was confused for quite a while following the event.  No known bladder incontinence but she wears a depends.  Certainly no fecal incontinence.  She was discharged with instructions to discontinue lisinopril , amlodipine  and atorvastatin  but she notes that the blood pressures were raising into the 150s over 100s so they went ahead and restarted amlodipine .  Her blood pressures have been 120s over 70s since that time.  Overall she seems to be feeling well and back to her baseline.  Family is interested in proceeding with a EEG.  Was previously seen at [facility].   ROS: Per HPI  Allergies  Allergen Reactions   Codeine Other (See Comments)    Unknown    Influenza Vaccines Other (See Comments)    Unknown    Remeron  [Mirtazapine ] Other (See Comments)    Dizziness    Sulfa Antibiotics Hives   Past Medical History:  Diagnosis Date   Acute cystitis with hematuria 11/22/2020   Atrial fibrillation (HCC)    BCC (basal cell carcinoma of skin) 11/14/2004   Right Mid Cheek(Cx3,5FU)   BCC (basal cell carcinoma of skin) 04/16/2011   Right Crease(tx p bx)   BCC (basal cell carcinoma of skin) 06/16/2016   Left Nare(tx p bx)   BCC (basal cell carcinoma  of skin) Atypical Basaloid 09/29/2018   Left Side Nose(tx p bx)   Cerebrovascular accident (CVA) due to occlusion of left middle cerebral artery (HCC) 10/18/2018   Hypertension    Osteoporosis    Pneumonia of lower lobe due to infectious organism 11/20/2020   SCC (squamous cell carcinoma) 09/29/2018   Left Jawline(tx p bx)   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   Right Jawline(Cx3,5FU) and Left Upper Cheek(Cx3,5FU)   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   Left Jawline(tx p bx), Nose(tx p bx), Right Cheek(tx p bx), and Right Forearm(tx p bx)   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   Right Temple(Cx3,5FU) and Left Outer Eye(Cx3,5FU)   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   Right Forearm(Cx3,5FU)   Squamous cell carcinoma in situ (SCCIS) 03/13/2005   Left Side of Nose(Cx3,5FU)   Squamous cell carcinoma in situ (SCCIS) 06/04/2005   Inner Right Cheek/Eye(Tx p Bx)   Squamous cell carcinoma in situ (SCCIS) 02/12/2010   Right Cheek(tx p bx)   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 2 09/29/2018   Upper Lip(tx p bx) and Bridge of Nose(tx p bx)   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 M,W,F, Disp: , Rfl:    busPIRone  (BUSPAR ) 10 MG tablet, TAKE 1 TABLET BY MOUTH AT BEDTIME, Disp: 180 tablet, Rfl: 0   hydrocortisone cream 1 %, Apply 1 Application topically 2 (two) times daily., Disp: , Rfl:    neomycin-bacitracin-polymyxin [phone] OINT, Apply 1 Application topically 2 (two) times daily as needed., Disp: 15 g, Rfl: 0   polyethylene glycol (MIRALAX  / GLYCOLAX ) 17 g packet, Take 17 g by mouth at bedtime., Disp: , Rfl:    primidone   (MYSOLINE ) 50 MG tablet, TAKE 1 AND 1/2 TABLETS BY MOUTH THREE TIMES DAILY (Patient taking differently: Take 75 mg by mouth 3 (three) times daily.), Disp: 405 tablet, Rfl: 0   warfarin (COUMADIN ) 5 MG tablet, TAKE 1 AND 1/2 TABLETS BY MOUTH EVERY DAY EXCEPT TAKE 1 TABLETS ON TUESDAY (Patient taking differently: Take 5-7.5 mg by mouth one time only at 4 PM. TAKE 1 AND 1/2 TABLETS BY MOUTH EVERY DAY EXCEPT TAKE 1 TABLETS ON TUESDAY), Disp: 135 tablet, Rfl: 3 Social History   Socioeconomic History   Marital status: Widowed    Spouse name: Not on file   Number of children: 3   Years of education: Not on file   Highest education level: Not on file  Occupational History   Occupation: Retired  Tobacco Use   Smoking status: Never   Smokeless tobacco: Never  Vaping Use   Vaping status: Never Used  Substance and Sexual Activity   Alcohol use: Never   Drug use: Never   Sexual activity: Not Currently  Other Topics Concern   Not on file  Social History Narrative   Not on file   Social Drivers of Health   Financial Resource Strain: Low Risk  (11/21/2020)   Received from [facility]   Overall Financial Resource Strain (CARDIA)    Difficulty of Paying Living Expenses: Not hard at all  Food Insecurity: No Food Insecurity (08/21/2023)   Hunger Vital Sign    Worried About Running Out of Food in the Last Year: Never true    Ran Out of Food in the Last Year: Never true  Transportation Needs: No Transportation Needs (08/21/2023)   PRAPARE - Administrator, Civil Service (Medical): No    Lack of Transportation (Non-Medical): No  Physical Activity: Not on file  Stress: Not on file  Social Connections: Socially Isolated (08/21/2023)   Social Connection and Isolation Panel    Frequency of Communication with Friends and Family: More than three times a week    Frequency of Social Gatherings with Friends and Family: More than three times a week    Attends Religious Services: Never     Database administrator or Organizations: No    Attends Banker Meetings: Never    Marital Status: Widowed  Intimate Partner Violence: Not At Risk (08/21/2023)   Humiliation, Afraid, Rape, and Kick questionnaire    Fear of Current or Ex-Partner: No    Emotionally Abused: No    Physically Abused: No    Sexually Abused: No   Family History  Problem Relation Age of Onset   Dementia Mother    Stroke Father    Heart disease Father    Diabetes Sister    Stroke Son     Objective: Office  vital signs reviewed. BP 126/82   Pulse (!) 101   Temp (!) 97.4 F (36.3 C)   Ht 5' 3 (1.6 m)   Wt 130 lb 2 oz (59 kg)   SpO2 90%   BMI 23.05 kg/m   Physical Examination:  General: Awake, alert, nontoxic elderly female, No acute distress HEENT: Sclera white.  Moist mucous membranes Cardio: Irregularly irregular with rate control, S1S2 heard, no murmurs appreciated Pulm: clear to auscultation bilaterally, no wheezes, rhonchi or rales; normal work of breathing on room air Neuro: Engaging, responds to questions appropriately.  Resting tremor present  Assessment/ Plan: 89 y.o. female   Unresponsive episode  Seizure-like activity (HCC) - Plan: Ambulatory referral to Neurology  Hospital discharge follow-up  Longstanding persistent atrial fibrillation (HCC) - Plan: CoaguChek XS/INR Waived  Anticoagulation goal of INR 2 to 3 - Plan: CoaguChek XS/INR Waived, CoaguChek XS/INR Waived  Referral sent to neurology for further evaluation of what almost sounds like a seizure-like episode.  I have adjusted her Coumadin  to take 2 tablets today instead of 1.5 and then resume 1.5 tablets daily except for 1 tablet on Tuesdays.  We will see each other back in 2 weeks for INR recheck.  No orders of the defined types were placed in this encounter.  No orders of the defined types were placed in this encounter.   Today's visit is for Transitional Care Management.  The patient was discharged from  [facility] on 08/23/23 with a primary diagnosis of unresponsive episode.   Contact with the patient and/or caregiver, by a clinical staff member, was made on 08/24/2023 and was documented as a telephone encounter within the EMR.  Through chart review and discussion with the patient I have determined that management of their condition is of moderate complexity.    Norene CHRISTELLA Fielding, DO [facility] [phone]

## 2023-09-02 DIAGNOSIS — C44619 Basal cell carcinoma of skin of left upper limb, including shoulder: Secondary | ICD-10-CM | POA: Diagnosis not present

## 2023-09-03 ENCOUNTER — Encounter: Payer: Self-pay | Admitting: Radiology

## 2023-09-06 ENCOUNTER — Other Ambulatory Visit: Payer: Self-pay | Admitting: Family Medicine

## 2023-09-06 ENCOUNTER — Ambulatory Visit (INDEPENDENT_AMBULATORY_CARE_PROVIDER_SITE_OTHER)

## 2023-09-06 DIAGNOSIS — Z7982 Long term (current) use of aspirin: Secondary | ICD-10-CM

## 2023-09-06 DIAGNOSIS — F32A Depression, unspecified: Secondary | ICD-10-CM

## 2023-09-06 DIAGNOSIS — N3000 Acute cystitis without hematuria: Secondary | ICD-10-CM

## 2023-09-06 DIAGNOSIS — C44619 Basal cell carcinoma of skin of left upper limb, including shoulder: Secondary | ICD-10-CM | POA: Diagnosis not present

## 2023-09-06 DIAGNOSIS — I083 Combined rheumatic disorders of mitral, aortic and tricuspid valves: Secondary | ICD-10-CM | POA: Diagnosis not present

## 2023-09-06 DIAGNOSIS — Z7901 Long term (current) use of anticoagulants: Secondary | ICD-10-CM | POA: Diagnosis not present

## 2023-09-06 DIAGNOSIS — Z8701 Personal history of pneumonia (recurrent): Secondary | ICD-10-CM

## 2023-09-06 DIAGNOSIS — Z8673 Personal history of transient ischemic attack (TIA), and cerebral infarction without residual deficits: Secondary | ICD-10-CM | POA: Diagnosis not present

## 2023-09-06 DIAGNOSIS — I119 Hypertensive heart disease without heart failure: Secondary | ICD-10-CM | POA: Diagnosis not present

## 2023-09-06 DIAGNOSIS — E785 Hyperlipidemia, unspecified: Secondary | ICD-10-CM

## 2023-09-06 DIAGNOSIS — I4891 Unspecified atrial fibrillation: Secondary | ICD-10-CM

## 2023-09-06 DIAGNOSIS — M810 Age-related osteoporosis without current pathological fracture: Secondary | ICD-10-CM | POA: Diagnosis not present

## 2023-09-06 DIAGNOSIS — Z9181 History of falling: Secondary | ICD-10-CM | POA: Diagnosis not present

## 2023-09-06 DIAGNOSIS — I10 Essential (primary) hypertension: Secondary | ICD-10-CM

## 2023-09-07 ENCOUNTER — Encounter: Payer: Self-pay | Admitting: Family Medicine

## 2023-09-08 ENCOUNTER — Telehealth: Payer: Self-pay | Admitting: Family Medicine

## 2023-09-08 ENCOUNTER — Other Ambulatory Visit: Payer: Self-pay | Admitting: Family Medicine

## 2023-09-08 NOTE — Telephone Encounter (Signed)
 Patient's request for amlodipine  was denied, but recent office visit note states she was put back on it. Please advise and call patient.

## 2023-09-08 NOTE — Telephone Encounter (Signed)
 Copied from CRM #[id number]. Topic: Clinical - Medication Refill >> Sep 08, 2023 12:18 PM Rachelle R wrote: Medication: amLODipine  (NORVASC ) 5 MG tablet  **see telephone encounter from 08/25/23 where pcp states patient should take medication.  Has the patient contacted their pharmacy? Yes, call dr  This is the patient's preferred pharmacy:  [facility] - [place], [place] - [address] [place] [address] Phone: [phone] Fax: [phone]  Is this the correct pharmacy for this prescription? Yes If no, delete pharmacy and type the correct one.   Has the prescription been filled recently? No  Is the patient out of the medication? Yes  Has the patient been seen for an appointment in the last year OR does the patient have an upcoming appointment? Yes  Can we respond through MyChart? Yes  Agent: Please be advised that Rx refills may take up to 3 business days. We ask that you follow-up with your pharmacy.

## 2023-09-09 DIAGNOSIS — I119 Hypertensive heart disease without heart failure: Secondary | ICD-10-CM | POA: Diagnosis not present

## 2023-09-09 DIAGNOSIS — Z8673 Personal history of transient ischemic attack (TIA), and cerebral infarction without residual deficits: Secondary | ICD-10-CM | POA: Diagnosis not present

## 2023-09-09 DIAGNOSIS — E785 Hyperlipidemia, unspecified: Secondary | ICD-10-CM | POA: Diagnosis not present

## 2023-09-09 DIAGNOSIS — I083 Combined rheumatic disorders of mitral, aortic and tricuspid valves: Secondary | ICD-10-CM | POA: Diagnosis not present

## 2023-09-09 DIAGNOSIS — N3000 Acute cystitis without hematuria: Secondary | ICD-10-CM | POA: Diagnosis not present

## 2023-09-09 DIAGNOSIS — I4891 Unspecified atrial fibrillation: Secondary | ICD-10-CM | POA: Diagnosis not present

## 2023-09-09 DIAGNOSIS — C44619 Basal cell carcinoma of skin of left upper limb, including shoulder: Secondary | ICD-10-CM | POA: Diagnosis not present

## 2023-09-09 DIAGNOSIS — Z7901 Long term (current) use of anticoagulants: Secondary | ICD-10-CM | POA: Diagnosis not present

## 2023-09-09 DIAGNOSIS — Z9181 History of falling: Secondary | ICD-10-CM | POA: Diagnosis not present

## 2023-09-09 DIAGNOSIS — M810 Age-related osteoporosis without current pathological fracture: Secondary | ICD-10-CM | POA: Diagnosis not present

## 2023-09-09 MED ORDER — AMLODIPINE BESYLATE 5 MG PO TABS: 5.0000 mg | ORAL_TABLET | Freq: Every day | ORAL | 1 refills | Status: DC

## 2023-09-09 NOTE — Telephone Encounter (Signed)
 After reviewing the last provider note it looks like amlodipine  was restarted by the family due to high blood pressures and PCP was ok with that. Sent refill to pharmacy

## 2023-09-09 NOTE — Addendum Note (Signed)
 Addended by: VIKTORIA ALAN MATSU on: 09/09/2023 02:11 PM   Modules accepted: Orders

## 2023-09-14 DIAGNOSIS — Z8673 Personal history of transient ischemic attack (TIA), and cerebral infarction without residual deficits: Secondary | ICD-10-CM | POA: Diagnosis not present

## 2023-09-14 DIAGNOSIS — I083 Combined rheumatic disorders of mitral, aortic and tricuspid valves: Secondary | ICD-10-CM | POA: Diagnosis not present

## 2023-09-14 DIAGNOSIS — C44619 Basal cell carcinoma of skin of left upper limb, including shoulder: Secondary | ICD-10-CM | POA: Diagnosis not present

## 2023-09-14 DIAGNOSIS — Z7982 Long term (current) use of aspirin: Secondary | ICD-10-CM | POA: Diagnosis not present

## 2023-09-14 DIAGNOSIS — N3000 Acute cystitis without hematuria: Secondary | ICD-10-CM | POA: Diagnosis not present

## 2023-09-14 DIAGNOSIS — E785 Hyperlipidemia, unspecified: Secondary | ICD-10-CM | POA: Diagnosis not present

## 2023-09-14 DIAGNOSIS — Z9181 History of falling: Secondary | ICD-10-CM | POA: Diagnosis not present

## 2023-09-14 DIAGNOSIS — Z7901 Long term (current) use of anticoagulants: Secondary | ICD-10-CM | POA: Diagnosis not present

## 2023-09-14 DIAGNOSIS — M810 Age-related osteoporosis without current pathological fracture: Secondary | ICD-10-CM | POA: Diagnosis not present

## 2023-09-14 DIAGNOSIS — I119 Hypertensive heart disease without heart failure: Secondary | ICD-10-CM | POA: Diagnosis not present

## 2023-09-14 DIAGNOSIS — I4891 Unspecified atrial fibrillation: Secondary | ICD-10-CM | POA: Diagnosis not present

## 2023-09-15 ENCOUNTER — Ambulatory Visit

## 2023-09-15 ENCOUNTER — Ambulatory Visit (INDEPENDENT_AMBULATORY_CARE_PROVIDER_SITE_OTHER): Admitting: Family Medicine

## 2023-09-15 ENCOUNTER — Encounter: Payer: Self-pay | Admitting: Family Medicine

## 2023-09-15 VITALS — BP 124/78 | HR 99 | Ht 59.0 in | Wt 130.4 lb

## 2023-09-15 DIAGNOSIS — I4811 Longstanding persistent atrial fibrillation: Secondary | ICD-10-CM

## 2023-09-15 DIAGNOSIS — Z7901 Long term (current) use of anticoagulants: Secondary | ICD-10-CM | POA: Diagnosis not present

## 2023-09-15 DIAGNOSIS — R791 Abnormal coagulation profile: Secondary | ICD-10-CM | POA: Diagnosis not present

## 2023-09-15 DIAGNOSIS — Z5181 Encounter for therapeutic drug level monitoring: Secondary | ICD-10-CM | POA: Diagnosis not present

## 2023-09-15 LAB — COAGUCHEK XS/INR WAIVED
INR: 1.7 — ABNORMAL HIGH (ref 0.9–1.1)
Prothrombin Time: 20.8 s

## 2023-09-15 NOTE — Progress Notes (Signed)
 Subjective: CC:INR PCP: Jolinda Dominique HERO, DO YEP:Dominique Farley is a 89 y.o. female presenting to clinic today for:  Discussed the use of AI scribe software for clinical note transcription with the patient, who gave verbal consent to proceed.  History of Present Illness  Atrial fibrillation Goal INR 2-3 Patient was subtherapeutic with INR 1.4 last visit.  She was instructed to increase her dosage to 2 tablets that day then resume normal dosing of 1.5 tablets daily except for Tuesdays to take 1 tablet.  She is brought today by her daughter and caregiver and they have complied with the regimen.  She reports no concerns.  Bumped her right lower extremity against the bed and had a little bruising and bleeding but otherwise no other changes to note.    ROS: Per HPI  Allergies  Allergen Reactions   Codeine Other (See Comments)    Unknown    Influenza Vaccines Other (See Comments)    Unknown    Remeron  [Mirtazapine ] Other (See Comments)    Dizziness    Sulfa Antibiotics Hives   Past Medical History:  Diagnosis Date   Acute cystitis with hematuria 11/22/2020   Atrial fibrillation (HCC)    BCC (basal cell carcinoma of skin) 11/14/2004   Right Mid Cheek(Cx3,5FU)   BCC (basal cell carcinoma of skin) 04/16/2011   Right Crease(tx p bx)   BCC (basal cell carcinoma of skin) 06/16/2016   Left Nare(tx p bx)   BCC (basal cell carcinoma of skin) Atypical Basaloid 09/29/2018   Left Side Nose(tx p bx)   Cerebrovascular accident (CVA) due to occlusion of left middle cerebral artery (HCC) 10/18/2018   Hypertension    Osteoporosis    Pneumonia of lower lobe due to infectious organism 11/20/2020   SCC (squamous cell carcinoma) 09/29/2018   Left Jawline(tx p bx)   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   Right Jawline(Cx3,5FU) and Left Upper Cheek(Cx3,5FU)   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   Left Jawline(tx p bx), Nose(tx p bx), Right Cheek(tx p bx), and Right Forearm(tx p  bx)   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   Right Temple(Cx3,5FU) and Left Outer Eye(Cx3,5FU)   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   Right Forearm(Cx3,5FU)   Squamous cell carcinoma in situ (SCCIS) 03/13/2005   Left Side of Nose(Cx3,5FU)   Squamous cell carcinoma in situ (SCCIS) 06/04/2005   Inner Right Cheek/Eye(Tx p Bx)   Squamous cell carcinoma in situ (SCCIS) 02/12/2010   Right Cheek(tx p bx)   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 2 09/29/2018   Upper Lip(tx p bx) and Bridge of Nose(tx p bx)   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 M,W,F, Disp: , Rfl:    busPIRone  (BUSPAR ) 10 MG tablet, TAKE 1 TABLET BY MOUTH AT BEDTIME, Disp: 180 tablet, Rfl: 0   hydrocortisone cream 1 %, Apply 1 Application topically 2 (two) times daily., Disp: , Rfl:  neomycin-bacitracin-polymyxin [phone] OINT, Apply 1 Application topically 2 (two) times daily as needed., Disp: 15 g, Rfl: 0   polyethylene glycol (MIRALAX  / GLYCOLAX ) 17 g packet, Take 17 g by mouth at bedtime., Disp: , Rfl:    primidone  (MYSOLINE ) 50 MG tablet, TAKE 1 AND 1/2 TABLETS BY MOUTH THREE TIMES DAILY (Patient taking differently: Take 75 mg by mouth 3 (three) times daily.), Disp: 405 tablet, Rfl: 0   warfarin (COUMADIN ) 5 MG tablet, TAKE 1 AND 1/2 TABLETS BY MOUTH EVERY DAY EXCEPT TAKE 1 TABLETS ON TUESDAY (Patient taking differently: Take 5-7.5 mg by mouth one time only at 4 PM. TAKE 1 AND 1/2 TABLETS BY MOUTH EVERY DAY EXCEPT TAKE 1 TABLETS ON TUESDAY), Disp: 135 tablet, Rfl:  3 Social History   Socioeconomic History   Marital status: Widowed    Spouse name: Not on file   Number of children: 3   Years of education: Not on file   Highest education level: Not on file  Occupational History   Occupation: Retired  Tobacco Use   Smoking status: Never   Smokeless tobacco: Never  Vaping Use   Vaping status: Never Used  Substance and Sexual Activity   Alcohol use: Never   Drug use: Never   Sexual activity: Not Currently  Other Topics Concern   Not on file  Social History Narrative   Not on file   Social Drivers of Health   Financial Resource Strain: Low Risk  (11/21/2020)   [facility]   Overall Financial Resource Strain (CARDIA)    Difficulty of Paying Living Expenses: Not hard at all  Food Insecurity: No Food Insecurity (08/21/2023)   Hunger Vital Sign    Worried About Running Out of Food in the Last Year: Never true    Ran Out of Food in the Last Year: Never true  Transportation Needs: No Transportation Needs (08/21/2023)   PRAPARE - Administrator, Civil Service (Medical): No    Lack of Transportation (Non-Medical): No  Physical Activity: Not on file  Stress: Not on file  Social Connections: Socially Isolated (08/21/2023)   Social Connection and Isolation Panel    Frequency of Communication with Friends and Family: More than three times a week    Frequency of Social Gatherings with Friends and Family: More than three times a week    Attends Religious Services: Never    Database administrator or Organizations: No    Attends Banker Meetings: Never    Marital Status: Widowed  Intimate Partner Violence: Not At Risk (08/21/2023)   Humiliation, Afraid, Rape, and Kick questionnaire    Fear of Current or Ex-Partner: No    Emotionally Abused: No    Physically Abused: No    Sexually Abused: No   Family History  Problem Relation Age of Onset   Dementia Mother    Stroke Father    Heart disease Father     Diabetes Sister    Stroke Son     Objective: Office vital signs reviewed. BP 124/78   Pulse 99   Ht 4' 11 (1.499 m)   Wt 130 lb 6 oz (59.1 kg)   SpO2 (!) 87%   BMI 26.33 kg/m   Physical Examination:  General: Awake, alert, elderly female in no acute distress,   HEENT: Jaw quiver present.  Sclera white Cardio: regular rate and rhythm, S1S2 heard, no murmurs appreciated Pulm: clear to auscultation bilaterally, no wheezes, rhonchi or rales;  normal work of breathing on room air MSK: Has scab along the right anterior lower leg.  Multiple areas of postinflammatory hypopigmentation and petechiae  Assessment/ Plan: 89 y.o. female   Subtherapeutic international normalized ratio (INR)  Longstanding persistent atrial fibrillation (HCC) - Plan: CoaguChek XS/INR Waived  Anticoagulation goal of INR 2 to 3 - Plan: CoaguChek XS/INR Waived  Assessment & Plan   INR subtherapeutic at 1.7 though improving from 1.4.  She will again increase to 2 tablets of Coumadin  today and now continue with 1.5 tablets daily every day.  This was verbalized and given as a handout to her family today.  We will see her back in about 3 weeks, sooner if concerns arise  Dominique CHRISTELLA Fielding, DO [facility] [phone]

## 2023-09-16 DIAGNOSIS — N3000 Acute cystitis without hematuria: Secondary | ICD-10-CM | POA: Diagnosis not present

## 2023-09-16 DIAGNOSIS — E785 Hyperlipidemia, unspecified: Secondary | ICD-10-CM | POA: Diagnosis not present

## 2023-09-16 DIAGNOSIS — M810 Age-related osteoporosis without current pathological fracture: Secondary | ICD-10-CM | POA: Diagnosis not present

## 2023-09-16 DIAGNOSIS — Z7982 Long term (current) use of aspirin: Secondary | ICD-10-CM | POA: Diagnosis not present

## 2023-09-16 DIAGNOSIS — C44619 Basal cell carcinoma of skin of left upper limb, including shoulder: Secondary | ICD-10-CM | POA: Diagnosis not present

## 2023-09-16 DIAGNOSIS — Z8673 Personal history of transient ischemic attack (TIA), and cerebral infarction without residual deficits: Secondary | ICD-10-CM | POA: Diagnosis not present

## 2023-09-16 DIAGNOSIS — Z9181 History of falling: Secondary | ICD-10-CM | POA: Diagnosis not present

## 2023-09-16 DIAGNOSIS — I4891 Unspecified atrial fibrillation: Secondary | ICD-10-CM | POA: Diagnosis not present

## 2023-09-16 DIAGNOSIS — I119 Hypertensive heart disease without heart failure: Secondary | ICD-10-CM | POA: Diagnosis not present

## 2023-09-16 DIAGNOSIS — I083 Combined rheumatic disorders of mitral, aortic and tricuspid valves: Secondary | ICD-10-CM | POA: Diagnosis not present

## 2023-09-16 DIAGNOSIS — Z7901 Long term (current) use of anticoagulants: Secondary | ICD-10-CM | POA: Diagnosis not present

## 2023-09-17 ENCOUNTER — Encounter: Payer: Self-pay | Admitting: Family Medicine

## 2023-09-17 MED ORDER — OLOPATADINE HCL 0.1 % OP SOLN: 1.0000 [drp] | Freq: Two times a day (BID) | OPHTHALMIC | 12 refills | Status: AC

## 2023-09-20 DIAGNOSIS — I739 Peripheral vascular disease, unspecified: Secondary | ICD-10-CM | POA: Diagnosis not present

## 2023-09-20 DIAGNOSIS — L110 Acquired keratosis follicularis: Secondary | ICD-10-CM | POA: Diagnosis not present

## 2023-09-20 DIAGNOSIS — M79672 Pain in left foot: Secondary | ICD-10-CM | POA: Diagnosis not present

## 2023-09-20 DIAGNOSIS — M79671 Pain in right foot: Secondary | ICD-10-CM | POA: Diagnosis not present

## 2023-09-29 ENCOUNTER — Ambulatory Visit: Payer: Self-pay

## 2023-09-29 ENCOUNTER — Encounter: Payer: Self-pay | Admitting: Family Medicine

## 2023-09-29 NOTE — Telephone Encounter (Signed)
 FYI Only or Action Required?: FYI only for provider.  Patient was last seen in primary care on 09/15/2023 by Jolinda Norene HERO, DO.  Called Nurse Triage reporting [place].  Symptoms began yesterday.  Interventions attempted: Nothing.  Symptoms are: gradually worsening.  Triage Disposition: See PCP When Office is Open (Within 3 Days)  Patient/caregiver understands and will follow disposition?: Yes   Copied from CRM [id number]. Topic: Clinical - Red Word Triage >> Sep 29, 2023  4:05 PM Dominique Farley wrote: Red Word that prompted transfer to Nurse Triage: abscess like bump on chest, red and inflamed. Looks like there is an opening and there is nothing coming out of it. Reason for Disposition  Boil > 1/2 inch across (> 12 mm; larger than a marble)  Answer Assessment - Initial Assessment Questions 1. APPEARANCE of BOIL: What does the boil look like?      Red raised area on chest, has an opening, but no drainage 2. LOCATION: Where is the boil located?      chest 3. NUMBER: How many boils are there?      one 4. SIZE: How big is the boil? (e.g., inches, cm; compare to size of a coin or other object)     Nickel sized 5. ONSET: When did the boil start?     One day ago 6. PAIN: Is there any pain? If Yes, ask: How bad is the pain?   (Scale 1-10; or mild, moderate, severe)     Denies pain 7. FEVER: Do you have a fever? If Yes, ask: What is it, how was it measured, and when did it start?      Unsure, but does not think so 8. SOURCE: Have you been around anyone with boils or other Staph infections? Have you ever had boils before?     History of staph infection 9. OTHER SYMPTOMS: Do you have any other symptoms? (e.g., shaking chills, weakness, rash elsewhere on body)     denies 10. PREGNANCY: Is there any chance you are pregnant? When was your last menstrual period?       N/a  Protocols used: Boil (Skin Abscess)-A-AH

## 2023-09-29 NOTE — Telephone Encounter (Signed)
 Offer her DOD tomorrow as I think we already have an after hours we're seeing in person.  If she has infection, I don't want to chance it getting worse

## 2023-09-29 NOTE — Telephone Encounter (Signed)
 Appointment scheduled.

## 2023-09-30 ENCOUNTER — Ambulatory Visit: Admitting: Family

## 2023-09-30 ENCOUNTER — Ambulatory Visit: Admitting: Nurse Practitioner

## 2023-09-30 ENCOUNTER — Encounter: Payer: Self-pay | Admitting: Family

## 2023-09-30 VITALS — BP 151/87 | HR 60 | Temp 97.4°F | Ht 59.0 in | Wt 131.2 lb

## 2023-09-30 DIAGNOSIS — S20211A Contusion of right front wall of thorax, initial encounter: Secondary | ICD-10-CM

## 2023-09-30 DIAGNOSIS — I4811 Longstanding persistent atrial fibrillation: Secondary | ICD-10-CM

## 2023-09-30 DIAGNOSIS — Z7901 Long term (current) use of anticoagulants: Secondary | ICD-10-CM

## 2023-09-30 DIAGNOSIS — R58 Hemorrhage, not elsewhere classified: Secondary | ICD-10-CM

## 2023-09-30 NOTE — Progress Notes (Signed)
 Subjective:    Patient ID: Dominique Farley, female    DOB: 07-Sep-1926, 89 y.o.   MRN: [id number]  Chief Complaint  Patient presents with   BOIL/ABCESS    CHEST    HPI PT presents to the office today with skin changes on her chest that they noticed two days ago. Reports erythemas.   She is on warfarin for A Fib.   She has hx of MRSA on her lower leg in 07/22/23. She is also followed by dermatologists for basal cell carcinoma.    Review of Systems  All other systems reviewed and are negative.   Social History   Socioeconomic History   Marital status: Widowed    Spouse name: Not on file   Number of children: 3   Years of education: Not on file   Highest education level: Not on file  Occupational History   Occupation: Retired  Tobacco Use   Smoking status: Never   Smokeless tobacco: Never  Vaping Use   Vaping status: Never Used  Substance and Sexual Activity   Alcohol use: Never   Drug use: Never   Sexual activity: Not Currently  Other Topics Concern   Not on file  Social History Narrative   Not on file   Social Drivers of Health   Financial Resource Strain: Low Risk  (11/21/2020)   Received [facility]   Overall Financial Resource Strain (CARDIA)    Difficulty of Paying Living Expenses: Not hard at all  Food Insecurity: No Food Insecurity (08/21/2023)   Hunger Vital Sign    Worried About Running Out of Food in the Last Year: Never true    Ran Out of Food in the Last Year: Never true  Transportation Needs: No Transportation Needs (08/21/2023)   PRAPARE - Administrator, Civil Service (Medical): No    Lack of Transportation (Non-Medical): No  Physical Activity: Not on file  Stress: Not on file  Social Connections: Socially Isolated (08/21/2023)   Social Connection and Isolation Panel    Frequency of Communication with Friends and Family: More than three times a week    Frequency of Social Gatherings with Friends and Family: More than three times  a week    Attends Religious Services: Never    Database administrator or Organizations: No    Attends Banker Meetings: Never    Marital Status: Widowed   Family History  Problem Relation Age of Onset   Dementia Mother    Stroke Father    Heart disease Father    Diabetes Sister    Stroke Son         Objective:   Physical Exam Vitals reviewed.  Constitutional:      General: She is not in acute distress.    Appearance: She is well-developed.  HENT:     Head: Normocephalic and atraumatic.     Nose: No rhinorrhea.  Eyes:     Pupils: Pupils are equal, round, and reactive to light.  Neck:     Thyroid: No thyromegaly.  Cardiovascular:     Rate and Rhythm: Normal rate and regular rhythm.     Heart sounds: Normal heart sounds. No murmur heard. Pulmonary:     Effort: Pulmonary effort is normal. No respiratory distress.     Breath sounds: Normal breath sounds. No wheezing.  Abdominal:     General: Bowel sounds are normal. There is no distension.     Palpations: Abdomen is soft.  Tenderness: There is no abdominal tenderness.  Musculoskeletal:        General: No tenderness. Normal range of motion.     Cervical back: Normal range of motion and neck supple.  Skin:    General: Skin is warm and dry.     Findings: Bruising and ecchymosis present. No rash.         Comments: Ecchymosis on right front chest that is dark purple that is 3X4 with mild erythemas surrounding area  Neurological:     Mental Status: She is alert and oriented to person, place, and time.     Cranial Nerves: No cranial nerve deficit.     Deep Tendon Reflexes: Reflexes are normal and symmetric.  Psychiatric:        Behavior: Behavior normal.        Thought Content: Thought content normal.        Judgment: Judgment normal.       BP (!) 151/87   Pulse 60   Temp (!) 97.4 F (36.3 C)   Ht 4' 11 (1.499 m)   Wt 131 lb 3.2 oz (59.5 kg)   SpO2 93%   BMI 26.50 kg/m      Assessment &  Plan:  Dominique Farley comes in today with chief complaint of BOIL/ABCESS (CHEST)   Diagnosis and orders addressed:  1. Current use of long term anticoagulation (Primary)  2. Longstanding persistent atrial fibrillation (HCC)  3. Ecchymosis  4. Contusion of right front wall of thorax, initial encounter   After discussing with patient, I believe this is a contusion. She sleeps with a home phone in her bed at night and may have rolled over that during the night. Pt has mild dementia and is a poor historian. Family will continue warfarin. Discussed with bruising it may start to look slightly worse for a day or so then improve. Ice. Should resolve in the next 2 weeks. Has a follow up with PCP at the end of the month.      Dominique Learn, FNP

## 2023-09-30 NOTE — Patient Instructions (Addendum)
Contusion A contusion is a deep bruise. Contusions are the result of a blunt injury to tissues and muscle fibers under the skin. The injury causes bleeding under the skin. The skin over the contusion may turn blue, purple, or yellow. Minor injuries will give you a painless contusion, but more severe injuries cause contusions that can stay painful and swollen for a few weeks. Follow these instructions at home: Pay attention to any changes in your symptoms. Let your health care provider know about them. Take these actions to relieve your pain. Managing pain, stiffness, and swelling  Use resting, icing, applying pressure (compression), and raising (elevating) the injured area. This is often called the RICE method. Rest the injured area. Return to your normal activities as told by your health care provider. Ask your health care provider what activities are safe for you. If directed, put ice on the injured area. To do this: Put ice in a plastic bag. Place a towel between your skin and the bag. Leave the ice on for 20 minutes, 2-3 times a day. If your skin turns bright red, remove the ice right away to prevent skin damage. The risk of skin damage is higher if you cannot feel pain, heat, or cold. If directed, apply light compression to the injured area using an elastic bandage. Make sure the bandage is not wrapped too tightly. Remove and reapply the bandage as directed by your health care provider. If possible, elevate the injured area above the level of your heart while you are sitting or lying down. General instructions Take over-the-counter and prescription medicines only as told by your health care provider. Keep all follow-up visits. Your health care provider may want to see how your contusion is healing with treatment. Contact a health care provider if: Your symptoms do not improve after several days of treatment. Your symptoms get worse. You have difficulty moving the injured area. Get help  right away if: You have severe pain. You have numbness in a hand or foot. Your hand or foot turns pale or cold. This information is not intended to replace advice given to you by your health care provider. Make sure you discuss any questions you have with your health care provider. Document Revised: 06/16/2021 Document Reviewed: 06/16/2021 Elsevier Patient Education  2024 [employer].

## 2023-10-12 ENCOUNTER — Ambulatory Visit (INDEPENDENT_AMBULATORY_CARE_PROVIDER_SITE_OTHER): Admitting: Family Medicine

## 2023-10-12 ENCOUNTER — Encounter: Payer: Self-pay | Admitting: Family Medicine

## 2023-10-12 VITALS — BP 126/80 | HR 69 | Temp 97.5°F | Ht 59.0 in | Wt 135.4 lb

## 2023-10-12 DIAGNOSIS — Z7901 Long term (current) use of anticoagulants: Secondary | ICD-10-CM | POA: Diagnosis not present

## 2023-10-12 DIAGNOSIS — R791 Abnormal coagulation profile: Secondary | ICD-10-CM | POA: Diagnosis not present

## 2023-10-12 DIAGNOSIS — I4811 Longstanding persistent atrial fibrillation: Secondary | ICD-10-CM

## 2023-10-12 DIAGNOSIS — Z5181 Encounter for therapeutic drug level monitoring: Secondary | ICD-10-CM

## 2023-10-12 LAB — COAGUCHEK XS/INR WAIVED
INR: 1.9 — ABNORMAL HIGH (ref 0.9–1.1)
Prothrombin Time: 22.7 s

## 2023-10-12 NOTE — Progress Notes (Signed)
 Subjective: CC: A-fib PCP: Jolinda Norene HERO, DO Dominique Farley is a 89 y.o. female presenting to clinic today for:  Compliant with 7.5 mg of Coumadin  daily.  Reports consumption of some green beans a couple of times since her last visit and seemingly more so recently but no other greens.  She is doing well and reports no changes in exercise tolerance.  No bleeding.  Accompanied today by her daughter Dickey and one of her nurse aides   ROS: Per HPI  Allergies  Allergen Reactions   Codeine Other (See Comments)    Unknown    Influenza Vaccines Other (See Comments)    Unknown    Remeron  [Mirtazapine ] Other (See Comments)    Dizziness    Sulfa Antibiotics Hives   Past Medical History:  Diagnosis Date   Acute cystitis with hematuria 11/22/2020   Atrial fibrillation (HCC)    BCC (basal cell carcinoma of skin) 11/14/2004   Right Mid Cheek(Cx3,5FU)   BCC (basal cell carcinoma of skin) 04/16/2011   Right Crease(tx p bx)   BCC (basal cell carcinoma of skin) 06/16/2016   Left Nare(tx p bx)   BCC (basal cell carcinoma of skin) Atypical Basaloid 09/29/2018   Left Side Nose(tx p bx)   Cerebrovascular accident (CVA) due to occlusion of left middle cerebral artery (HCC) 10/18/2018   Hypertension    Osteoporosis    Pneumonia of lower lobe due to infectious organism 11/20/2020   SCC (squamous cell carcinoma) 09/29/2018   Left Jawline(tx p bx)   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   Right Jawline(Cx3,5FU) and Left Upper Cheek(Cx3,5FU)   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   Left Jawline(tx p bx), Nose(tx p bx), Right Cheek(tx p bx), and Right Forearm(tx p bx)   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   Right Temple(Cx3,5FU) and Left Outer Eye(Cx3,5FU)   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   Right Forearm(Cx3,5FU)   Squamous cell carcinoma in situ (SCCIS) 03/13/2005   Left Side of Nose(Cx3,5FU)   Squamous cell carcinoma in situ (SCCIS)  06/04/2005   Inner Right Cheek/Eye(Tx p Bx)   Squamous cell carcinoma in situ (SCCIS) 02/12/2010   Right Cheek(tx p bx)   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 2 09/29/2018   Upper Lip(tx p bx) and Bridge of Nose(tx p bx)   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 M,W,F, Disp: , Rfl:    busPIRone  (BUSPAR ) 10 MG tablet, TAKE 1 TABLET BY MOUTH AT BEDTIME, Disp: 180 tablet, Rfl: 0   hydrocortisone cream 1 %, Apply 1 Application topically 2 (two) times daily., Disp: , Rfl:    olopatadine  (PATANOL) 0.1 % ophthalmic solution, Place 1 drop into both eyes 2 (two) times daily. As needed for watery eyes, Disp: 5 mL, Rfl: 12   polyethylene glycol (MIRALAX  / GLYCOLAX ) 17 g packet, Take 17 g by mouth at bedtime., Disp: , Rfl:    primidone  (MYSOLINE )  50 MG tablet, TAKE 1 AND 1/2 TABLETS BY MOUTH THREE TIMES DAILY, Disp: 405 tablet, Rfl: 0   warfarin (COUMADIN ) 5 MG tablet, TAKE 1 AND 1/2 TABLETS BY MOUTH EVERY DAY EXCEPT TAKE 1 TABLETS ON TUESDAY (Patient taking differently: Take 5-7.5 mg by mouth one time only at 4 PM. TAKE 1 AND 1/2 TABLETS BY MOUTH EVERY DAY EXCEPT TAKE 1 TABLETS ON TUESDAY), Disp: 135 tablet, Rfl: 3   neomycin-bacitracin-polymyxin [phone] OINT, Apply 1 Application topically 2 (two) times daily as needed. (Patient not taking: Reported on 10/12/2023), Disp: 15 g, Rfl: 0 Social History   Socioeconomic History   Marital status: Widowed    Spouse name: Not on file   Number of children: 3   Years of education: Not on file   Highest education level: Not on file   Occupational History   Occupation: Retired  Tobacco Use   Smoking status: Never   Smokeless tobacco: Never  Vaping Use   Vaping status: Never Used  Substance and Sexual Activity   Alcohol use: Never   Drug use: Never   Sexual activity: Not Currently  Other Topics Concern   Not on file  Social History Narrative   Not on file   Social Drivers of Health   Financial Resource Strain: Low Risk  (11/21/2020)   Received from [facility]   Overall Financial Resource Strain (CARDIA)    Difficulty of Paying Living Expenses: Not hard at all  Food Insecurity: No Food Insecurity (08/21/2023)   Hunger Vital Sign    Worried About Running Out of Food in the Last Year: Never true    Ran Out of Food in the Last Year: Never true  Transportation Needs: No Transportation Needs (08/21/2023)   PRAPARE - Administrator, Civil Service (Medical): No    Lack of Transportation (Non-Medical): No  Physical Activity: Not on file  Stress: Not on file  Social Connections: Socially Isolated (08/21/2023)   Social Connection and Isolation Panel    Frequency of Communication with Friends and Family: More than three times a week    Frequency of Social Gatherings with Friends and Family: More than three times a week    Attends Religious Services: Never    Database administrator or Organizations: No    Attends Banker Meetings: Never    Marital Status: Widowed  Intimate Partner Violence: Not At Risk (08/21/2023)   Humiliation, Afraid, Rape, and Kick questionnaire    Fear of Current or Ex-Partner: No    Emotionally Abused: No    Physically Abused: No    Sexually Abused: No   Family History  Problem Relation Age of Onset   Dementia Mother    Stroke Father    Heart disease Father    Diabetes Sister    Stroke Son     Objective: Office vital signs reviewed. BP 126/80   Pulse 69   Temp (!) 97.5 F (36.4 C)   Ht 4' 11 (1.499 m)   Wt 135 lb 6 oz (61.4 kg)   SpO2 96%   BMI 27.34  kg/m   Physical Examination:  General: Awake, alert, well-appearing elderly female, No acute distress HEENT: Sclera white.  Moist mucous membranes.  Jaw quiver present Cardio: regular rate and rhythm, S1S2 heard, no murmurs appreciated Pulm: clear to auscultation bilaterally, no wheezes, rhonchi or rales; normal work of breathing on room air    Assessment/ Plan: 89 y.o. female   Subtherapeutic international normalized  ratio (INR)  Longstanding persistent atrial fibrillation (HCC) - Plan: CoaguChek XS/INR Waived  Anticoagulation goal of INR 2 to 3 - Plan: CoaguChek XS/INR Waived   INR subtherapeutic at 1.9.  Increase to 2 tablets today then resume 1.5 tablets daily thereafter.  May follow-up in 4 weeks, sooner if concerns arise   Norene CHRISTELLA Fielding, DO [facility] [phone]

## 2023-10-13 ENCOUNTER — Encounter: Payer: Self-pay | Admitting: Cardiology

## 2023-10-13 ENCOUNTER — Ambulatory Visit: Attending: Cardiology | Admitting: Cardiology

## 2023-10-13 VITALS — BP 130/72 | HR 62 | Ht 60.0 in | Wt 134.6 lb

## 2023-10-13 DIAGNOSIS — I4811 Longstanding persistent atrial fibrillation: Secondary | ICD-10-CM | POA: Diagnosis not present

## 2023-10-13 DIAGNOSIS — E782 Mixed hyperlipidemia: Secondary | ICD-10-CM | POA: Diagnosis not present

## 2023-10-13 DIAGNOSIS — R55 Syncope and collapse: Secondary | ICD-10-CM | POA: Diagnosis not present

## 2023-10-13 DIAGNOSIS — D6869 Other thrombophilia: Secondary | ICD-10-CM

## 2023-10-13 DIAGNOSIS — I10 Essential (primary) hypertension: Secondary | ICD-10-CM | POA: Diagnosis not present

## 2023-10-13 NOTE — Patient Instructions (Signed)
 Medication Instructions:  Your physician recommends that you continue on your current medications as directed. Please refer to the Current Medication list given to you today.  *If you need a refill on your cardiac medications before your next appointment, please call your pharmacy*  Lab Work: None If you have labs (blood work) drawn today and your tests are completely normal, you will receive your results only by: MyChart Message (if you have MyChart) OR A paper copy in the mail If you have any lab test that is abnormal or we need to change your treatment, we will call you to review the results.  Testing/Procedures: None  Follow-Up: At [facility], you and your health needs are our priority.  As part of our continuing mission to provide you with exceptional heart care, our providers are all part of one team.  This team includes your primary Cardiologist (physician) and Advanced Practice Providers or APPs (Physician Assistants and Nurse Practitioners) who all work together to provide you with the care you need, when you need it.  Your next appointment:   6 month(s)  Provider:   You may see Dina Rich, MD or the following Advanced Practice Provider on your designated Care Team:   Sharlene Dory, NP    We recommend signing up for the patient portal called "MyChart".  Sign up information is provided on this After Visit Summary.  MyChart is used to connect with patients for Virtual Visits (Telemedicine).  Patients are able to view lab/test results, encounter notes, upcoming appointments, etc.  Non-urgent messages can be sent to your provider as well.   To learn more about what you can do with MyChart, go to [email or web address].   Other Instructions

## 2023-10-13 NOTE — Progress Notes (Signed)
 Clinical Summary Ms. Loewe is a 89 y.o.female seen today for follow up of the following medical problems.      1. Long standing persistent afib - diagnosed 10/2018 during 10/2018 - no recent palpitations - no lightheadendess or dizziness. - doing well on coumadin . Could not use DOAC due to being on primidone . INR follwoed by pcp       - low HRs recent pcp visit, lowered propranolol  to 10mg  bid. From pcp note would be ok coming off propanolol if neccesary and treating tremor with just primidone .  - feels nervous inside.  - no bleeding on coumadin .    07/2021 3 day monitor: Patient in afib throughout study. Rates 36-112, Avg HR 62  - after monitor given low HRs propranolol  was stopped   - no recent palpitations - some recent unsteadiness on her feet. In September had a fall, unclear history of what happened.  - at times can have decreased oral intake.  - no bleeding on coumadin    - no palpitations - no bleeding on coumadin       2. History of CVA - has been on ASA 81mg  every other day it appears - admit 10/2018 at [facility], left middle cerebral. Recs were for coumadin  at that time and ASA   - left MCA thrombectomy   3.HTN - has some component of white coat HTN     - lisinopril  stopped due to low bp's     4. Tremor - she is on primidone      5.Hyperlipidemia - labs per pcp - she is on atorvastatin   08/2023 TC 116 TG 32 HDL 59 LDL 51   6. Syncope - admit 08/2023 to [facility] - from notes extensive workup, thought to be vasogal with related hypotension in setting of UTI - working to Past Medical History:  Diagnosis Date   Acute cystitis with hematuria 11/22/2020   Atrial fibrillation (HCC)    BCC (basal cell carcinoma of skin) 11/14/2004   Right Mid Cheek(Cx3,5FU)   BCC (basal cell carcinoma of skin) 04/16/2011   Right Crease(tx p bx)   BCC (basal cell carcinoma of skin) 06/16/2016   Left Nare(tx p bx)   BCC (basal cell carcinoma of  skin) Atypical Basaloid 09/29/2018   Left Side Nose(tx p bx)   Cerebrovascular accident (CVA) due to occlusion of left middle cerebral artery (HCC) 10/18/2018   Hypertension    Osteoporosis    Pneumonia of lower lobe due to infectious organism 11/20/2020   SCC (squamous cell carcinoma) 09/29/2018   Left Jawline(tx p bx)   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   Right Jawline(Cx3,5FU) and Left Upper Cheek(Cx3,5FU)   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   Left Jawline(tx p bx), Nose(tx p bx), Right Cheek(tx p bx), and Right Forearm(tx p bx)   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   Right Temple(Cx3,5FU) and Left Outer Eye(Cx3,5FU)   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   Right Forearm(Cx3,5FU)   Squamous cell carcinoma in situ (SCCIS) 03/13/2005   Left Side of Nose(Cx3,5FU)   Squamous cell carcinoma in situ (SCCIS) 06/04/2005   Inner Right Cheek/Eye(Tx p Bx)   Squamous cell carcinoma in situ (SCCIS) 02/12/2010   Right Cheek(tx p bx)   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 2 09/29/2018   Upper Lip(tx p bx) and Bridge of Nose(tx p bx)   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 90 tablet 1   ascorbic acid (VITAMIN C) 500 MG tablet Take 500 mg by mouth daily.     aspirin  EC 81 MG tablet Take 81 mg by mouth 3 (three) times a week. M,W,F     busPIRone  (BUSPAR ) 10 MG tablet TAKE 1 TABLET BY MOUTH AT BEDTIME 180 tablet 0   hydrocortisone cream 1 % Apply 1  Application topically 2 (two) times daily.     neomycin-bacitracin-polymyxin [phone] OINT Apply 1 Application topically 2 (two) times daily as needed. (Patient not taking: Reported on 10/12/2023) 15 g 0   olopatadine  (PATANOL) 0.1 % ophthalmic solution Place 1 drop into both eyes 2 (two) times daily. As needed for watery eyes 5 mL 12   polyethylene glycol (MIRALAX  / GLYCOLAX ) 17 g packet Take 17 g by mouth at bedtime.     primidone  (MYSOLINE ) 50 MG tablet TAKE 1 AND 1/2 TABLETS BY MOUTH THREE TIMES DAILY 405 tablet 0   warfarin (COUMADIN ) 5 MG tablet TAKE 1 AND 1/2 TABLETS BY MOUTH EVERY DAY EXCEPT TAKE 1 TABLETS ON TUESDAY (Patient taking differently: Take 5-7.5 mg by mouth one time only at 4 PM. TAKE 1 AND 1/2 TABLETS BY MOUTH EVERY DAY EXCEPT TAKE 1 TABLETS ON TUESDAY) 135 tablet 3   No current facility-administered medications for this visit.     Past Surgical History:  Procedure Laterality Date   CHOLECYSTECTOMY     skin biopsies       Allergies  Allergen Reactions   Codeine Other (See Comments)    Unknown    Influenza Vaccines Other (See Comments)    Unknown    Remeron  [Mirtazapine ] Other (See Comments)    Dizziness    Sulfa Antibiotics Hives      Family History  Problem Relation Age of Onset   Dementia Mother    Stroke Father    Heart disease Father    Diabetes Sister    Stroke Son      Social History Ms. Tamer reports that she has never smoked. She has never used smokeless tobacco. Ms. Bernales reports no history of alcohol use.    Physical Examination Today's Vitals   10/13/23 1531  BP: 130/72  Pulse: 62  SpO2: 95%  Weight: 134 lb 9.6 oz (61.1 kg)  Height: 5' (1.524 m)   Body mass index is 26.29 kg/m.  Gen: resting comfortably, no acute distress HEENT: no scleral icterus, pupils equal round and reactive, no palptable cervical adenopathy,  CV: RRR, no mrg, no jvd Resp: Clear to auscultation bilaterally GI: abdomen is soft, non-tender,  non-distended, normal bowel sounds, no hepatosplenomegaly MSK: extremities are warm, no edema.  Skin: warm, no rash Neuro:  no focal deficits Psych: appropriate affect   Diagnostic Studies  07/2021 3 day monitor 3 day monitor   Patient in afib throughout study. Rates 36-112, Avg HR 62   Rare ventricular ectopy in the form of PVCs  No symptoms reported     Patch Wear Time:  3 days and 18 hours (2023-06-23T15:57:54-0400 to 2023-06-27T10:03:52-0400)   Atrial Fibrillation occurred continuously (100% burden), ranging from 36-112 bpm (avg of 62 bpm). Isolated VEs were rare (<1.0%), and no VE Couplets or VE Triplets were present.     Assessment and Plan   1. Longstanding persistent Afib/acquired thrombophilia - on coumadin , could not take DOAC due to interaction with primidone  - low HRs at times on monitor, we stopped her propanolol - no recent symptoms, continue current meds   2. HTN - off lisinopril  since recent admission with syncope, remains on norvasc  5mg  daily.  - bp's at goal, continue current meds. Higher target given advanced age, prior orthostatic dizziness   3.Hyperlipidemia - at goal continue current meds  4. Syncope - extensive workup was benign during admission - possibly related to UTI, vasovagal event. If recurrences would consider outpatient heart monitor      Dorn PHEBE Ross, M.D.

## 2023-10-22 ENCOUNTER — Other Ambulatory Visit: Payer: Self-pay | Admitting: Family Medicine

## 2023-10-22 DIAGNOSIS — G250 Essential tremor: Secondary | ICD-10-CM

## 2023-11-09 ENCOUNTER — Encounter: Payer: Self-pay | Admitting: Family Medicine

## 2023-11-09 ENCOUNTER — Ambulatory Visit (INDEPENDENT_AMBULATORY_CARE_PROVIDER_SITE_OTHER): Admitting: Family Medicine

## 2023-11-09 VITALS — BP 132/80 | HR 57 | Temp 97.4°F | Ht 60.0 in | Wt 133.5 lb

## 2023-11-09 DIAGNOSIS — R791 Abnormal coagulation profile: Secondary | ICD-10-CM

## 2023-11-09 DIAGNOSIS — I4811 Longstanding persistent atrial fibrillation: Secondary | ICD-10-CM | POA: Diagnosis not present

## 2023-11-09 DIAGNOSIS — Z5181 Encounter for therapeutic drug level monitoring: Secondary | ICD-10-CM | POA: Diagnosis not present

## 2023-11-09 DIAGNOSIS — Z7901 Long term (current) use of anticoagulants: Secondary | ICD-10-CM | POA: Diagnosis not present

## 2023-11-09 LAB — COAGUCHEK XS/INR WAIVED
INR: 1.9 — ABNORMAL HIGH (ref 0.9–1.1)
Prothrombin Time: 22.5 s

## 2023-11-09 NOTE — Progress Notes (Signed)
 Subjective: CC: INR follow-up PCP: Jolinda Dominique HERO, DO YEP:Dominique Farley is a 89 y.o. female presenting to clinic today for:  Chronic anticoagulation for valvular atrial fibrillation Goal INR 2-3 Patient is compliant with 7.5 mg of Coumadin  daily.  At her last visit she was slightly subtherapeutic with INR of 1.9 so administered 10 mg on 1 day and then resume 7.5 mg daily.  She reports no bleeding or changes in diet.  She reports a little bit of irritation behind the right ear   ROS: Per HPI  Allergies  Allergen Reactions   Codeine Other (See Comments)    Unknown    Influenza Vaccines Other (See Comments)    Unknown    Remeron  [Mirtazapine ] Other (See Comments)    Dizziness    Sulfa Antibiotics Hives   Past Medical History:  Diagnosis Date   Acute cystitis with hematuria 11/22/2020   Atrial fibrillation (HCC)    BCC (basal cell carcinoma of skin) 11/14/2004   Right Mid Cheek(Cx3,5FU)   BCC (basal cell carcinoma of skin) 04/16/2011   Right Crease(tx p bx)   BCC (basal cell carcinoma of skin) 06/16/2016   Left Nare(tx p bx)   BCC (basal cell carcinoma of skin) Atypical Basaloid 09/29/2018   Left Side Nose(tx p bx)   Cerebrovascular accident (CVA) due to occlusion of left middle cerebral artery (HCC) 10/18/2018   Hypertension    Osteoporosis    Pneumonia of lower lobe due to infectious organism 11/20/2020   SCC (squamous cell carcinoma) 09/29/2018   Left Jawline(tx p bx)   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   Right Jawline(Cx3,5FU) and Left Upper Cheek(Cx3,5FU)   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   Left Jawline(tx p bx), Nose(tx p bx), Right Cheek(tx p bx), and Right Forearm(tx p bx)   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   Right Temple(Cx3,5FU) and Left Outer Eye(Cx3,5FU)   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   Right Forearm(Cx3,5FU)   Squamous cell carcinoma in situ (SCCIS) 03/13/2005   Left Side of  Nose(Cx3,5FU)   Squamous cell carcinoma in situ (SCCIS) 06/04/2005   Inner Right Cheek/Eye(Tx p Bx)   Squamous cell carcinoma in situ (SCCIS) 02/12/2010   Right Cheek(tx p bx)   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 2 09/29/2018   Upper Lip(tx p bx) and Bridge of Nose(tx p bx)   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 M,W,F, Disp: , Rfl:    busPIRone  (BUSPAR ) 10 MG tablet, TAKE 1 TABLET BY MOUTH AT BEDTIME, Disp: 180 tablet, Rfl: 0   hydrocortisone cream 1 %, Apply 1 Application topically 2 (two) times daily., Disp: , Rfl:    neomycin-bacitracin-polymyxin [phone] OINT, Apply 1 Application topically 2 (two) times daily as needed., Disp: 15 g, Rfl: 0   olopatadine  (PATANOL) 0.1 % ophthalmic solution, Place 1 drop into both eyes 2 (two) times daily. As needed for watery eyes, Disp: 5 mL,  Rfl: 12   polyethylene glycol (MIRALAX  / GLYCOLAX ) 17 g packet, Take 17 g by mouth at bedtime., Disp: , Rfl:    primidone  (MYSOLINE ) 50 MG tablet, TAKE 1 AND 1/2 TABLETS BY MOUTH THREE TIMES DAILY, Disp: 405 tablet, Rfl: 0   warfarin (COUMADIN ) 5 MG tablet, TAKE 1 AND 1/2 TABLETS BY MOUTH EVERY DAY EXCEPT TAKE 1 TABLETS ON TUESDAY (Patient taking differently: Take 5-7.5 mg by mouth one time only at 4 PM. TAKE 1 AND 1/2 TABLETS BY MOUTH EVERY DAY EXCEPT TAKE 1 TABLETS ON TUESDAY), Disp: 135 tablet, Rfl: 3 Social History   Socioeconomic History   Marital status: Widowed    Spouse name: Not on file   Number of children: 3   Years of education: Not on file   Highest education level: Not on  file  Occupational History   Occupation: Retired  Tobacco Use   Smoking status: Never   Smokeless tobacco: Never  Vaping Use   Vaping status: Never Used  Substance and Sexual Activity   Alcohol use: Never   Drug use: Never   Sexual activity: Not Currently  Other Topics Concern   Not on file  Social History Narrative   Not on file   Social Drivers of Health   Financial Resource Strain: Low Risk  (11/21/2020)   [facility]   Overall Financial Resource Strain (CARDIA)    Difficulty of Paying Living Expenses: Not hard at all  Food Insecurity: No Food Insecurity (08/21/2023)   Hunger Vital Sign    Worried About Running Out of Food in the Last Year: Never true    Ran Out of Food in the Last Year: Never true  Transportation Needs: No Transportation Needs (08/21/2023)   PRAPARE - Administrator, Civil Service (Medical): No    Lack of Transportation (Non-Medical): No  Physical Activity: Not on file  Stress: Not on file  Social Connections: Socially Isolated (08/21/2023)   Social Connection and Isolation Panel    Frequency of Communication with Friends and Family: More than three times a week    Frequency of Social Gatherings with Friends and Family: More than three times a week    Attends Religious Services: Never    Database Administrator or Organizations: No    Attends Banker Meetings: Never    Marital Status: Widowed  Intimate Partner Violence: Not At Risk (08/21/2023)   Humiliation, Afraid, Rape, and Kick questionnaire    Fear of Current or Ex-Partner: No    Emotionally Abused: No    Physically Abused: No    Sexually Abused: No   Family History  Problem Relation Age of Onset   Dementia Mother    Stroke Father    Heart disease Father    Diabetes Sister    Stroke Son     Objective: Office vital signs reviewed. BP 132/80   Pulse (!) 57   Temp (!) 97.4 F (36.3 C)   Ht 5' (1.524 m)   Wt 133 lb 8 oz (60.6 kg)   SpO2 93%   BMI  26.07 kg/m   Physical Examination:  General: Awake, alert, well nourished, No acute distress HEENT: Sclera white.  Moist mucous membranes.  Small area of dermatitis noted behind the right ear Cardio: Irregularly irregular rhythm.  Rate controlled.  S1S2 heard, no murmurs appreciated Pulm: clear to auscultation bilaterally, no wheezes, rhonchi or rales; normal work of breathing on  room air  Assessment/ Plan:  89 y.o. female   Subtherapeutic international normalized ratio (INR) - Plan: CoaguChek XS/INR Waived  Longstanding persistent atrial fibrillation (HCC) - Plan: CoaguChek XS/INR Waived  Anticoagulation goal of INR 2 to 3 - Plan: CoaguChek XS/INR Waived   Advance to 10 mg every Tuesday and continue 7.5 mg daily.  1 month follow-up scheduled.    Dominique CHRISTELLA Fielding, DO [facility] [phone]

## 2023-11-10 ENCOUNTER — Ambulatory Visit: Payer: Self-pay | Admitting: Family Medicine

## 2023-11-15 ENCOUNTER — Encounter: Payer: Self-pay | Admitting: Radiology

## 2023-12-13 ENCOUNTER — Telehealth: Payer: Self-pay | Admitting: Neurology

## 2023-12-13 NOTE — Telephone Encounter (Signed)
 Request to cancel appointment

## 2023-12-14 ENCOUNTER — Ambulatory Visit: Admitting: Family Medicine

## 2023-12-14 ENCOUNTER — Encounter: Payer: Self-pay | Admitting: Family Medicine

## 2023-12-14 VITALS — BP 136/78 | HR 54 | Ht 60.0 in | Wt 140.1 lb

## 2023-12-14 DIAGNOSIS — Z7901 Long term (current) use of anticoagulants: Secondary | ICD-10-CM

## 2023-12-14 DIAGNOSIS — I4811 Longstanding persistent atrial fibrillation: Secondary | ICD-10-CM

## 2023-12-14 DIAGNOSIS — Z5181 Encounter for therapeutic drug level monitoring: Secondary | ICD-10-CM

## 2023-12-14 LAB — COAGUCHEK XS/INR WAIVED
INR: 1.9 — ABNORMAL HIGH (ref 0.9–1.1)
Prothrombin Time: 23.2 s

## 2023-12-14 NOTE — Progress Notes (Signed)
 Subjective: CC: Atrial fibrillation PCP: Jolinda Norene HERO, DO YEP:Dominique Farley is a 89 y.o. female presenting to clinic today for:  Patient here for management of chronic anticoagulation in the setting of atrial fibrillation.  She is brought to the office by her daughter and her caregiver.  She has been compliant with Coumadin  regimen as directed at last visit.  She reports no chest pain, shortness of breath or changes in diet.   ROS: Per HPI  Allergies  Allergen Reactions   Codeine Other (See Comments)    Unknown    Influenza Vaccines Other (See Comments)    Unknown    Remeron  [Mirtazapine ] Other (See Comments)    Dizziness    Sulfa Antibiotics Hives   Past Medical History:  Diagnosis Date   Acute cystitis with hematuria 11/22/2020   Atrial fibrillation (HCC)    BCC (basal cell carcinoma of skin) 11/14/2004   Right Mid Cheek(Cx3,5FU)   BCC (basal cell carcinoma of skin) 04/16/2011   Right Crease(tx p bx)   BCC (basal cell carcinoma of skin) 06/16/2016   Left Nare(tx p bx)   BCC (basal cell carcinoma of skin) Atypical Basaloid 09/29/2018   Left Side Nose(tx p bx)   Cerebrovascular accident (CVA) due to occlusion of left middle cerebral artery (HCC) 10/18/2018   Hypertension    Osteoporosis    Pneumonia of lower lobe due to infectious organism 11/20/2020   SCC (squamous cell carcinoma) 09/29/2018   Left Jawline(tx p bx)   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   Right Jawline(Cx3,5FU) and Left Upper Cheek(Cx3,5FU)   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   Left Jawline(tx p bx), Nose(tx p bx), Right Cheek(tx p bx), and Right Forearm(tx p bx)   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   Right Temple(Cx3,5FU) and Left Outer Eye(Cx3,5FU)   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   Right Forearm(Cx3,5FU)   Squamous cell carcinoma in situ (SCCIS) 03/13/2005   Left Side of Nose(Cx3,5FU)   Squamous cell carcinoma in situ (SCCIS)  06/04/2005   Inner Right Cheek/Eye(Tx p Bx)   Squamous cell carcinoma in situ (SCCIS) 02/12/2010   Right Cheek(tx p bx)   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 2 09/29/2018   Upper Lip(tx p bx) and Bridge of Nose(tx p bx)   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 M,W,F, Disp: , Rfl:    busPIRone  (BUSPAR ) 10 MG tablet, TAKE 1 TABLET BY MOUTH AT BEDTIME, Disp: 180 tablet, Rfl: 0   hydrocortisone cream 1 %, Apply 1 Application topically 2 (two) times daily., Disp: , Rfl:    neomycin-bacitracin-polymyxin [phone] OINT, Apply 1 Application topically 2 (two) times daily as needed. (Patient not taking: Reported on 11/09/2023), Disp: 15 g, Rfl: 0   olopatadine  (PATANOL) 0.1 % ophthalmic solution, Place 1 drop into both eyes 2 (two) times daily. As needed for watery eyes, Disp: 5 mL, Rfl: 12   polyethylene glycol (MIRALAX  /  GLYCOLAX ) 17 g packet, Take 17 g by mouth at bedtime., Disp: , Rfl:    primidone  (MYSOLINE ) 50 MG tablet, TAKE 1 AND 1/2 TABLETS BY MOUTH THREE TIMES DAILY, Disp: 405 tablet, Rfl: 0   warfarin (COUMADIN ) 5 MG tablet, TAKE 1 AND 1/2 TABLETS BY MOUTH EVERY DAY EXCEPT TAKE 1 TABLETS ON TUESDAY (Patient taking differently: Take 5-7.5 mg by mouth one time only at 4 PM. TAKE 1 AND 1/2 TABLETS BY MOUTH EVERY DAY EXCEPT TAKE 1 TABLETS ON TUESDAY), Disp: 135 tablet, Rfl: 3 Social History   Socioeconomic History   Marital status: Widowed    Spouse name: Not on file   Number of children: 3   Years of education: Not on file   Highest education level: Not on file   Occupational History   Occupation: Retired  Tobacco Use   Smoking status: Never   Smokeless tobacco: Never  Vaping Use   Vaping status: Never Used  Substance and Sexual Activity   Alcohol use: Never   Drug use: Never   Sexual activity: Not Currently  Other Topics Concern   Not on file  Social History Narrative   Not on file   Social Drivers of Health   Financial Resource Strain: Low Risk (11/21/2020)   Received [facility]   Overall Financial Resource Strain (CARDIA)    Difficulty of Paying Living Expenses: Not hard at all  Food Insecurity: No Food Insecurity (08/21/2023)   Hunger Vital Sign    Worried About Running Out of Food in the Last Year: Never true    Ran Out of Food in the Last Year: Never true  Transportation Needs: No Transportation Needs (08/21/2023)   PRAPARE - Administrator, Civil Service (Medical): No    Lack of Transportation (Non-Medical): No  Physical Activity: Not on file  Stress: Not on file  Social Connections: Socially Isolated (08/21/2023)   Social Connection and Isolation Panel    Frequency of Communication with Friends and Family: More than three times a week    Frequency of Social Gatherings with Friends and Family: More than three times a week    Attends Religious Services: Never    Database Administrator or Organizations: No    Attends Banker Meetings: Never    Marital Status: Widowed  Intimate Partner Violence: Not At Risk (08/21/2023)   Humiliation, Afraid, Rape, and Kick questionnaire    Fear of Current or Ex-Partner: No    Emotionally Abused: No    Physically Abused: No    Sexually Abused: No   Family History  Problem Relation Age of Onset   Dementia Mother    Stroke Father    Heart disease Father    Diabetes Sister    Stroke Son     Objective: Office vital signs reviewed. BP 136/78   Pulse (!) 54   Ht 5' (1.524 m)   Wt 140 lb 2 oz (63.6 kg)   SpO2 (!) 88%   BMI 27.37 kg/m   Physical  Examination:  General: Awake, alert, well nourished, well-appearing elderly female.  No acute distress HEENT: Sclera white.  Moist mucous membranes Cardio: Bradycardic with regular rhythm, S1S2 heard, no murmurs appreciated Pulm: clear to auscultation bilaterally, no wheezes, rhonchi or rales; normal work of breathing on room air MSK: Ambulates with use of rolling walker Neuro: Jaw tremor present  Assessment/ Plan: 89 y.o. female   Longstanding persistent atrial fibrillation (HCC)  Anticoagulation goal of INR  2 to 3 - Plan: CoaguChek XS/INR Waived   INR subtherapeutic at 1.9.  Increase to 2 tablets 2 days/week and then continue 1 tablet daily all others.  1 week follow-up for INR recheck scheduled.  Low oxygen level secondary to her finger being cold.  She is having no pulmonary symptoms   Norene CHRISTELLA Fielding, DO [facility] [phone]

## 2023-12-15 ENCOUNTER — Encounter: Payer: Self-pay | Admitting: Family Medicine

## 2023-12-15 ENCOUNTER — Telehealth: Admitting: Family Medicine

## 2023-12-15 DIAGNOSIS — R300 Dysuria: Secondary | ICD-10-CM

## 2023-12-15 MED ORDER — CEPHALEXIN 500 MG PO CAPS: 500.0000 mg | ORAL_CAPSULE | Freq: Two times a day (BID) | ORAL | 0 refills | Status: DC

## 2023-12-15 MED ORDER — AMOXICILLIN-POT CLAVULANATE 875-125 MG PO TABS: 1.0000 | ORAL_TABLET | Freq: Two times a day (BID) | ORAL | 0 refills | Status: AC

## 2023-12-15 NOTE — Progress Notes (Signed)
 Virtual Visit via Video   I connected with patient on 12/15/23 at home by a video enabled telemedicine application and verified that I am speaking with the correct person using two identifiers.  Location patient: Home Location provider: [facility] Persons participating in the virtual visit: Patient and Provider  I discussed the limitations of evaluation and management by telemedicine and the availability of in person appointments. The patient expressed understanding and agreed to proceed.  Subjective:   HPI:  Pt presents today for CC of dysuria.   Patient daughter on the phone call with her mother (the patient) and states that her mother reported dysuria this am.   The patient daughter also stated that her mother seems a little confused today.   Denies fever, vomiting, abd pain.   The daughter wanted to do a video visit today because she said that it is a lot of work to get her mother to the office. She does not have supplies to catch and transport the urine.     ROS   Patient Active Problem List   Diagnosis Date Noted   Unresponsive episode 08/21/2023   Skin tear of lower leg without complication, initial encounter 07/22/2023   Basal cell carcinoma (BCC) of skin of left upper extremity including shoulder 04/26/2023   Frailty syndrome in geriatric patient 03/10/2022   History of CVA in adulthood 03/10/2022   Purpura senilis 03/10/2022   Squamous cell carcinoma in situ (SCCIS) of skin 10/25/2019   Bilateral primary osteoarthritis of knee 06/14/2019   Atrial fibrillation (HCC) 11/04/2018   Anticoagulation goal of INR 2 to 3 11/04/2018   Tremor 08/24/2018   Essential hypertension 08/24/2018   Age related osteoporosis 08/24/2018    Social History   Tobacco Use   Smoking status: Never   Smokeless tobacco: Never  Substance Use Topics   Alcohol use: Never    Current Outpatient Medications:    amoxicillin -clavulanate (AUGMENTIN)  875-125 MG tablet, Take 1 tablet by mouth 2 (two) times daily for 7 days., Disp: 14 tablet, Rfl: 0   amLODipine  (NORVASC ) 5 MG tablet, Take 1 tablet (5 mg total) by mouth daily., Disp: 90 tablet, Rfl: 1   ascorbic acid (VITAMIN C) 500 MG tablet, Take 500 mg by mouth daily., Disp: , Rfl:    aspirin  EC 81 MG tablet, Take 81 mg by mouth 3 (three) times a week. M,W,F, Disp: , Rfl:    busPIRone  (BUSPAR ) 10 MG tablet, TAKE 1 TABLET BY MOUTH AT BEDTIME, Disp: 180 tablet, Rfl: 0   hydrocortisone cream 1 %, Apply 1 Application topically 2 (two) times daily., Disp: , Rfl:    neomycin-bacitracin-polymyxin [phone] OINT, Apply 1 Application topically 2 (two) times daily as needed. (Patient not taking: Reported on 12/14/2023), Disp: 15 g, Rfl: 0   olopatadine  (PATANOL) 0.1 % ophthalmic solution, Place 1 drop into both eyes 2 (two) times daily. As needed for watery eyes, Disp: 5 mL, Rfl: 12   polyethylene glycol (MIRALAX  / GLYCOLAX ) 17 g packet, Take 17 g by mouth at bedtime., Disp: , Rfl:    primidone  (MYSOLINE ) 50 MG tablet, TAKE 1 AND 1/2 TABLETS BY MOUTH THREE TIMES DAILY, Disp: 405 tablet, Rfl: 0   warfarin (COUMADIN ) 5 MG tablet, TAKE 1 AND 1/2 TABLETS BY MOUTH EVERY DAY EXCEPT TAKE 1 TABLETS ON TUESDAY (Patient taking differently: Take 5-7.5 mg by mouth one time only at 4 PM. TAKE 1 AND 1/2 TABLETS BY MOUTH EVERY DAY EXCEPT TAKE 1 TABLETS  ON TUESDAY), Disp: 135 tablet, Rfl: 3  Allergies  Allergen Reactions   Codeine Other (See Comments)    Unknown    Influenza Vaccines Other (See Comments)    Unknown    Remeron  [Mirtazapine ] Other (See Comments)    Dizziness    Sulfa Antibiotics Hives    Objective:   There were no vitals taken for this visit.  Patient is well-developed, well-nourished in no acute distress.  Resting comfortably at home.  Head is normocephalic, atraumatic.  No labored breathing.  Speech is clear and coherent with logical content.  Patient is alert and oriented at baseline.     Assessment and Plan:   Diagnoses and all orders for this visit:  Dysuria -     Urinalysis, Routine w reflex microscopic; Future -     Urine Culture; Future -    Augmentin BID x 7 days   Family is going to pick up urine collection device at the office today but they stated that they would not have time to get it back today. I ordered Augmentin to treat for possible UTI empirically. Discussed obtaining the urine first today before starting the antibiotic today. Store urine in the refrigerator and bring back to the office tomorrow morning for UA and culture. Patient daughter voiced understanding with the plan.    Follow up if symptoms acutely worsen, no improvement over the next 1-2 days, fever develops, or for any other concerns   Return if symptoms worsen or fail to improve.  Oneil Severin, FNP-C [facility] [address] [place], [place] [address] [phone]  12/15/2023  Time spent with the patient: 25 minutes, of which >50% was spent in obtaining information about symptoms, reviewing previous labs, evaluations, and treatments, counseling about condition (please see the discussed topics above), and developing a plan to further investigate it; had a number of questions which I addressed.

## 2023-12-15 NOTE — Patient Instructions (Signed)
 Follow up if symptoms acutely worsen, no improvement over the next 1-2 days, fever develops, or for any other concerns

## 2023-12-16 ENCOUNTER — Encounter: Payer: Self-pay | Admitting: Family Medicine

## 2023-12-16 ENCOUNTER — Other Ambulatory Visit

## 2023-12-16 ENCOUNTER — Other Ambulatory Visit: Payer: Self-pay | Admitting: Family Medicine

## 2023-12-16 DIAGNOSIS — R300 Dysuria: Secondary | ICD-10-CM

## 2023-12-16 LAB — URINALYSIS, ROUTINE W REFLEX MICROSCOPIC
Bilirubin, UA: NEGATIVE
Glucose, UA: NEGATIVE
Ketones, UA: NEGATIVE
Nitrite, UA: NEGATIVE
Protein,UA: NEGATIVE
Specific Gravity, UA: <=1.005 — AB (ref 1.005–1.030)
Urobilinogen, Ur: 0.2 mg/dL (ref 0.2–1.0)
pH, UA: 6.5 (ref 5.0–7.5)

## 2023-12-16 LAB — MICROSCOPIC EXAMINATION
Bacteria, UA: NONE SEEN
Renal Epithel, UA: NONE SEEN /HPF

## 2023-12-16 NOTE — Progress Notes (Unsigned)
 Called and spoke with the patient's daughter Dickey about UA results. Positive for leuks and blood. Patient started Augmentin yesterday evening for potential UTI. Discussed continuing antibiotics. Awaiting urine culture results. Discussed follow up if symptoms acutely worsen, no improvement over the next 2-3 days, fever develops, or for any other concerns

## 2023-12-20 LAB — URINE CULTURE

## 2023-12-21 ENCOUNTER — Other Ambulatory Visit: Payer: Self-pay | Admitting: Family Medicine

## 2023-12-21 ENCOUNTER — Ambulatory Visit: Admitting: Family Medicine

## 2023-12-21 ENCOUNTER — Encounter: Payer: Self-pay | Admitting: Family Medicine

## 2023-12-21 ENCOUNTER — Ambulatory Visit: Payer: Self-pay | Admitting: Family Medicine

## 2023-12-21 VITALS — BP 136/80 | HR 72 | Temp 97.2°F | Ht 60.0 in | Wt 140.0 lb

## 2023-12-21 DIAGNOSIS — L409 Psoriasis, unspecified: Secondary | ICD-10-CM

## 2023-12-21 DIAGNOSIS — Z5181 Encounter for therapeutic drug level monitoring: Secondary | ICD-10-CM

## 2023-12-21 DIAGNOSIS — G479 Sleep disorder, unspecified: Secondary | ICD-10-CM

## 2023-12-21 DIAGNOSIS — I4811 Longstanding persistent atrial fibrillation: Secondary | ICD-10-CM

## 2023-12-21 LAB — COAGUCHEK XS/INR WAIVED
INR: 2.8 — ABNORMAL HIGH (ref 0.9–1.1)
Prothrombin Time: 33.5 s

## 2023-12-21 MED ORDER — BETAMETHASONE DIPROPIONATE 0.05 % EX CREA: TOPICAL_CREAM | Freq: Two times a day (BID) | CUTANEOUS | 2 refills | Status: AC

## 2023-12-21 NOTE — Progress Notes (Signed)
 Subjective: CC: INR check PCP: Jolinda Norene HERO, DO YEP:Dominique Farley is a 89 y.o. female presenting to clinic today for:  Patient here for interval INR check.  She was noted to have subtherapeutic INR at last visit and so we increased her dosing to 2 tablets 2 days/week and continued on 1 tablet daily all others.  She denies any abnormal bleeding.  Since her last visit she was started on Augmentin  for suspected UTI.  She seems to be doing okay with this and voices no concerns except for flareup in her psoriasis on her back.  She has an appoint with dermatology but it will be for a while.  Symptoms are refractory to OTC topical corticosteroids.   ROS: Per HPI  Allergies  Allergen Reactions   Codeine Other (See Comments)    Unknown    Influenza Vaccines Other (See Comments)    Unknown    Remeron  [Mirtazapine ] Other (See Comments)    Dizziness    Sulfa Antibiotics Hives   Past Medical History:  Diagnosis Date   Acute cystitis with hematuria 11/22/2020   Atrial fibrillation (HCC)    BCC (basal cell carcinoma of skin) 11/14/2004   Right Mid Cheek(Cx3,5FU)   BCC (basal cell carcinoma of skin) 04/16/2011   Right Crease(tx p bx)   BCC (basal cell carcinoma of skin) 06/16/2016   Left Nare(tx p bx)   BCC (basal cell carcinoma of skin) Atypical Basaloid 09/29/2018   Left Side Nose(tx p bx)   Cerebrovascular accident (CVA) due to occlusion of left middle cerebral artery (HCC) 10/18/2018   Hypertension    Osteoporosis    Pneumonia of lower lobe due to infectious organism 11/20/2020   SCC (squamous cell carcinoma) 09/29/2018   Left Jawline(tx p bx)   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   Right Jawline(Cx3,5FU) and Left Upper Cheek(Cx3,5FU)   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   Left Jawline(tx p bx), Nose(tx p bx), Right Cheek(tx p bx), and Right Forearm(tx p bx)   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   Right Temple(Cx3,5FU) and Left Outer  Eye(Cx3,5FU)   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   Right Forearm(Cx3,5FU)   Squamous cell carcinoma in situ (SCCIS) 03/13/2005   Left Side of Nose(Cx3,5FU)   Squamous cell carcinoma in situ (SCCIS) 06/04/2005   Inner Right Cheek/Eye(Tx p Bx)   Squamous cell carcinoma in situ (SCCIS) 02/12/2010   Right Cheek(tx p bx)   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 2 09/29/2018   Upper Lip(tx p bx) and Bridge of Nose(tx p bx)   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 M,W,F, Disp: , Rfl:    betamethasone  dipropionate 0.05 % cream, Apply topically 2 (two) times daily. X7-14 days as needed for psoriasis flares, Disp: 45 g, Rfl: 2  busPIRone  (BUSPAR ) 10 MG tablet, TAKE 1 TABLET BY MOUTH AT BEDTIME, Disp: 180 tablet, Rfl: 0   hydrocortisone cream 1 %, Apply 1 Application topically 2 (two) times daily., Disp: , Rfl:    olopatadine  (PATANOL) 0.1 % ophthalmic solution, Place 1 drop into both eyes 2 (two) times daily. As needed for watery eyes, Disp: 5 mL, Rfl: 12   polyethylene glycol (MIRALAX  / GLYCOLAX ) 17 g packet, Take 17 g by mouth at bedtime., Disp: , Rfl:    primidone  (MYSOLINE ) 50 MG tablet, TAKE 1 AND 1/2 TABLETS BY MOUTH THREE TIMES DAILY, Disp: 405 tablet, Rfl: 0   warfarin (COUMADIN ) 5 MG tablet, TAKE 1 AND 1/2 TABLETS BY MOUTH EVERY DAY EXCEPT TAKE 1 TABLETS ON TUESDAY (Patient taking differently: Take 5-7.5 mg by mouth one time  only at 4 PM. TAKE 1 AND 1/2 TABLETS BY MOUTH EVERY DAY EXCEPT TAKE 1 TABLETS ON TUESDAY), Disp: 135 tablet, Rfl: 3   neomycin-bacitracin-polymyxin [phone] OINT, Apply 1 Application topically 2 (two) times daily as needed. (Patient not taking: Reported on 12/21/2023), Disp: 15 g, Rfl: 0 Social History   Socioeconomic History   Marital status: Widowed    Spouse name: Not on file   Number of children: 3   Years of education: Not on file   Highest education level: Not on file  Occupational History   Occupation: Retired  Tobacco Use   Smoking status: Never   Smokeless tobacco: Never  Vaping Use   Vaping status: Never Used  Substance and Sexual Activity   Alcohol use: Never   Drug use: Never   Sexual activity: Not Currently  Other Topics Concern   Not on file  Social History Narrative   Not on file   Social Drivers of Health   Financial Resource Strain: Low Risk (11/21/2020)   Received from [facility]   Overall Financial Resource Strain (CARDIA)    Difficulty of Paying Living Expenses: Not hard at all  Food Insecurity: No Food Insecurity (08/21/2023)   Hunger Vital Sign    Worried About Running Out of Food in the Last Year: Never true    Ran Out of Food in the Last Year: Never true  Transportation Needs: No Transportation Needs (08/21/2023)   PRAPARE - Administrator, Civil Service (Medical): No    Lack of Transportation (Non-Medical): No  Physical Activity: Not on file  Stress: Not on file  Social Connections: Socially Isolated (08/21/2023)   Social Connection and Isolation Panel    Frequency of Communication with Friends and Family: More than three times a week    Frequency of Social Gatherings with Friends and Family: More than three times a week    Attends Religious Services: Never    Database Administrator or Organizations: No    Attends Banker Meetings: Never    Marital Status: Widowed  Intimate Partner Violence: Not At Risk (08/21/2023)    Humiliation, Afraid, Rape, and Kick questionnaire    Fear of Current or Ex-Partner: No    Emotionally Abused: No    Physically Abused: No    Sexually Abused: No   Family History  Problem Relation Age of Onset   Dementia Mother    Stroke Father    Heart disease Father    Diabetes Sister    Stroke Son     Objective: Office vital signs reviewed. BP 136/80   Pulse 72   Temp (!) 97.2 F (36.2 C)   Ht  5' (1.524 m)   Wt 140 lb (63.5 kg)   SpO2 94%   BMI 27.34 kg/m   Physical Examination:  General: Awake, alert, nontoxic-appearing elderly female, No acute distress HEENT: Sclera white.  Moist mucous membranes.  Jaw tremor present Cardio: Irregularly irregular with rate control, S1S2 heard, no murmurs appreciated Pulm: clear to auscultation bilaterally, no wheezes, rhonchi or rales; normal work of breathing on room air MSK: Requires assistance for ambulation Skin: Psoriatic plaques noted along the upper back, waist and upper buttocks  Assessment/ Plan: 89 y.o. female   Longstanding persistent atrial fibrillation (HCC) - Plan: CoaguChek XS/INR Waived  Anticoagulation goal of INR 2 to 3 - Plan: CoaguChek XS/INR Waived  Psoriasis - Plan: betamethasone  dipropionate 0.05 % cream   INR therapeutic at 2.8 today.  Continue current regimen.  May follow-up in 8 weeks in that appointment has been made  Betamethasone  applied to the affected areas twice daily as needed.  Keep appointment with dermatology.   Norene CHRISTELLA Fielding, DO [facility] [phone]

## 2023-12-23 ENCOUNTER — Institutional Professional Consult (permissible substitution): Admitting: Neurology

## 2023-12-28 ENCOUNTER — Encounter: Payer: Self-pay | Admitting: Family Medicine

## 2023-12-29 ENCOUNTER — Encounter: Payer: Self-pay | Admitting: Family Medicine

## 2023-12-29 ENCOUNTER — Ambulatory Visit

## 2023-12-29 VITALS — BP 150/82 | HR 82 | Temp 97.7°F | Ht 60.0 in | Wt 140.6 lb

## 2023-12-29 DIAGNOSIS — N3001 Acute cystitis with hematuria: Secondary | ICD-10-CM

## 2023-12-29 DIAGNOSIS — R319 Hematuria, unspecified: Secondary | ICD-10-CM

## 2023-12-29 LAB — URINALYSIS, ROUTINE W REFLEX MICROSCOPIC
Bilirubin, UA: NEGATIVE
Glucose, UA: NEGATIVE
Ketones, UA: NEGATIVE
Nitrite, UA: NEGATIVE
Protein,UA: NEGATIVE
Specific Gravity, UA: <=1.005 — AB (ref 1.005–1.030)
Urobilinogen, Ur: 0.2 mg/dL (ref 0.2–1.0)
pH, UA: 6.5 (ref 5.0–7.5)

## 2023-12-29 LAB — MICROSCOPIC EXAMINATION
Bacteria, UA: NONE SEEN
Renal Epithel, UA: NONE SEEN /HPF

## 2023-12-29 MED ORDER — CEPHALEXIN 500 MG PO CAPS: 500.0000 mg | ORAL_CAPSULE | Freq: Two times a day (BID) | ORAL | 0 refills | Status: AC

## 2023-12-29 NOTE — Progress Notes (Signed)
 Acute Office Visit  Subjective:     Patient ID: Dominique Farley, female    DOB: 10-23-1926, 89 y.o.   MRN: [id number]  Chief Complaint  Patient presents with   Hematuria    Hematuria    History of Present Illness   Dominique Farley is a 89 year old female who presents with blood on her incontinence pad.  Her caregiver noted a pink tint on her incontinence pad yesterday and after wiping this morning, but no significant blood has been seen since. She had a urinary tract infection two weeks ago, diagnosed on December 16, 2023, for which she was treated with Augmentin . Post-treatment, her symptoms of confusion and burning improved.  Recently, she reported a 'little' burning sensation when asked by her caregiver, but this is the first time she has mentioned it since the previous infection. No fever. There is concern from the caregiver about the potential impact of an increased dose of warfarin on bleeding risk, as her warfarin dosage was recently adjusted.   Denies fever, flank pain, abdominal pain, nausea, vomiting.       Review of Systems  Genitourinary:  Positive for hematuria.   As per HPI.      Objective:    BP (!) 150/82   Pulse 82   Temp 97.7 F (36.5 C) (Temporal)   Ht 5' (1.524 m)   Wt 140 lb 9.6 oz (63.8 kg)   SpO2 94%   BMI 27.46 kg/m    Physical Exam Vitals and nursing note reviewed.  Constitutional:      General: She is not in acute distress.    Appearance: She is not ill-appearing, toxic-appearing or diaphoretic.  Cardiovascular:     Rate and Rhythm: Normal rate and regular rhythm.     Heart sounds: Normal heart sounds. No murmur heard. Pulmonary:     Effort: Pulmonary effort is normal. No respiratory distress.     Breath sounds: Normal breath sounds.  Abdominal:     General: Bowel sounds are normal. There is no distension.     Palpations: Abdomen is soft.     Tenderness: There is no abdominal tenderness. There is no guarding or rebound.  Musculoskeletal:      Right lower leg: No edema.     Left lower leg: No edema.  Skin:    General: Skin is warm and dry.  Neurological:     Mental Status: She is alert and oriented to person, place, and time. Mental status is at baseline.  Psychiatric:        Mood and Affect: Mood normal.        Behavior: Behavior normal.     No results found for any visits on 12/29/23.      Assessment & Plan:   Dominique Farley was seen today for hematuria.  Diagnoses and all orders for this visit:  Hematuria, unspecified type -     Urinalysis, Routine w reflex microscopic -     Urine Culture -     cephALEXin  (KEFLEX ) 500 MG capsule; Take 1 capsule (500 mg total) by mouth 2 (two) times daily for 7 days.  Acute cystitis with hematuria -     cephALEXin  (KEFLEX ) 500 MG capsule; Take 1 capsule (500 mg total) by mouth 2 (two) times daily for 7 days.   Assessment and Plan    Acute cystitis with hematuria Recent UTI with positive culture for e.coli, treated with Augmentin . Mild burning and trace blood and leuks on dipstick.  - Prescribed Keflex   twice daily for one week. - Sent urine culture to confirm UTI. - Advised to report if bleeding persists after 2-3 days on antibiotics or if symptoms worsen. - Continue current warfarin dosage unless significant bleeding occurs.      Return to office for new or worsening symptoms, or if symptoms persist.   The patient indicates understanding of these issues and agrees with the plan.  Dominique CHRISTELLA Search, FNP

## 2024-01-24 ENCOUNTER — Other Ambulatory Visit: Payer: Self-pay

## 2024-01-24 ENCOUNTER — Encounter: Payer: Self-pay | Admitting: Family Medicine

## 2024-01-24 DIAGNOSIS — C44619 Basal cell carcinoma of skin of left upper limb, including shoulder: Secondary | ICD-10-CM

## 2024-01-24 DIAGNOSIS — I739 Peripheral vascular disease, unspecified: Secondary | ICD-10-CM

## 2024-01-24 DIAGNOSIS — I4891 Unspecified atrial fibrillation: Secondary | ICD-10-CM

## 2024-01-25 ENCOUNTER — Telehealth: Payer: Self-pay | Admitting: Family Medicine

## 2024-01-25 NOTE — Telephone Encounter (Signed)
 I spoke with Duwaine and she found what she was looking for in the referral notes that were faxed over. Copied from CRM [id number]. Topic: Referral - Question >> Jan 25, 2024  1:43 PM Alfonso ORN wrote: Reason for CRM: megan with  [facility] recieved a referral for a  Hematology/ oncology , have question if patient need to see a radiologist or hematogist reason asking because Dr. Dannielle is a radiologist  Dr. Dannielle is a radiologist Duwaine will be in office until  4pm today  01/25/24 to talk with her  [phone]

## 2024-01-25 NOTE — Telephone Encounter (Signed)
 Absolutely.

## 2024-01-25 NOTE — Telephone Encounter (Signed)
Okay to place referrals? 

## 2024-02-07 ENCOUNTER — Other Ambulatory Visit: Payer: Self-pay | Admitting: Family Medicine

## 2024-03-06 ENCOUNTER — Ambulatory Visit: Admitting: Family Medicine

## 2024-03-15 ENCOUNTER — Ambulatory Visit: Admitting: Cardiology
